# Patient Record
Sex: Female | Born: 1990 | Race: White | Hispanic: No | Marital: Married
Health system: Southern US, Academic
[De-identification: ages and names within clinical notes are randomized; demographics above are authoritative.]

## PROBLEM LIST (undated history)

## (undated) ENCOUNTER — Telehealth: Attending: Internal Medicine | Primary: Internal Medicine

## (undated) ENCOUNTER — Encounter
Attending: Student in an Organized Health Care Education/Training Program | Primary: Student in an Organized Health Care Education/Training Program

## (undated) ENCOUNTER — Encounter

## (undated) ENCOUNTER — Telehealth: Attending: Women's Health | Primary: Women's Health

## (undated) ENCOUNTER — Encounter: Attending: Internal Medicine | Primary: Internal Medicine

## (undated) ENCOUNTER — Ambulatory Visit

## (undated) ENCOUNTER — Encounter: Attending: Medical Oncology | Primary: Medical Oncology

## (undated) ENCOUNTER — Encounter: Attending: Pharmacist | Primary: Pharmacist

## (undated) ENCOUNTER — Ambulatory Visit: Payer: PRIVATE HEALTH INSURANCE | Attending: Clinical | Primary: Clinical

## (undated) ENCOUNTER — Ambulatory Visit: Payer: PRIVATE HEALTH INSURANCE

## (undated) ENCOUNTER — Telehealth

## (undated) ENCOUNTER — Telehealth: Attending: Pharmacist | Primary: Pharmacist

## (undated) ENCOUNTER — Encounter: Attending: Women's Health | Primary: Women's Health

## (undated) ENCOUNTER — Telehealth: Attending: Adult Health | Primary: Adult Health

## (undated) ENCOUNTER — Encounter: Attending: Family | Primary: Family

## (undated) ENCOUNTER — Telehealth: Payer: PRIVATE HEALTH INSURANCE | Attending: Clinical | Primary: Clinical

## (undated) ENCOUNTER — Encounter: Attending: Clinical | Primary: Clinical

## (undated) ENCOUNTER — Telehealth
Attending: Student in an Organized Health Care Education/Training Program | Primary: Student in an Organized Health Care Education/Training Program

## (undated) ENCOUNTER — Encounter
Attending: Rehabilitative and Restorative Service Providers" | Primary: Rehabilitative and Restorative Service Providers"

## (undated) ENCOUNTER — Ambulatory Visit: Payer: PRIVATE HEALTH INSURANCE | Attending: Internal Medicine | Primary: Internal Medicine

## (undated) ENCOUNTER — Encounter: Attending: Adult Health | Primary: Adult Health

## (undated) ENCOUNTER — Ambulatory Visit: Payer: PRIVATE HEALTH INSURANCE | Attending: Adult Health | Primary: Adult Health

## (undated) ENCOUNTER — Ambulatory Visit
Payer: PRIVATE HEALTH INSURANCE | Attending: Plastic and Reconstructive Surgery | Primary: Plastic and Reconstructive Surgery

## (undated) ENCOUNTER — Ambulatory Visit: Payer: PRIVATE HEALTH INSURANCE | Attending: "Women's Health Care | Primary: "Women's Health Care

## (undated) ENCOUNTER — Institutional Professional Consult (permissible substitution): Payer: PRIVATE HEALTH INSURANCE

## (undated) ENCOUNTER — Encounter: Payer: PRIVATE HEALTH INSURANCE | Attending: Internal Medicine | Primary: Internal Medicine

## (undated) ENCOUNTER — Ambulatory Visit: Payer: PRIVATE HEALTH INSURANCE | Attending: Radiation Oncology | Primary: Radiation Oncology

## (undated) ENCOUNTER — Ambulatory Visit: Payer: PRIVATE HEALTH INSURANCE | Attending: Obstetrics & Gynecology | Primary: Obstetrics & Gynecology

## (undated) ENCOUNTER — Ambulatory Visit
Payer: PRIVATE HEALTH INSURANCE | Attending: Rehabilitative and Restorative Service Providers" | Primary: Rehabilitative and Restorative Service Providers"

## (undated) ENCOUNTER — Encounter: Attending: Critical Care Medicine | Primary: Critical Care Medicine

## (undated) ENCOUNTER — Ambulatory Visit: Payer: PRIVATE HEALTH INSURANCE | Attending: Foot and Ankle Surgery | Primary: Foot and Ankle Surgery

## (undated) ENCOUNTER — Ambulatory Visit: Payer: PRIVATE HEALTH INSURANCE | Attending: Family | Primary: Family

## (undated) ENCOUNTER — Ambulatory Visit: Attending: Radiation Oncology | Primary: Radiation Oncology

## (undated) ENCOUNTER — Encounter: Attending: Geriatric Medicine | Primary: Geriatric Medicine

## (undated) ENCOUNTER — Encounter: Attending: Nurse Practitioner | Primary: Nurse Practitioner

## (undated) ENCOUNTER — Encounter: Attending: Cardiovascular Disease | Primary: Cardiovascular Disease

## (undated) ENCOUNTER — Encounter: Attending: Foot and Ankle Surgery | Primary: Foot and Ankle Surgery

## (undated) ENCOUNTER — Telehealth: Attending: Family | Primary: Family

## (undated) ENCOUNTER — Ambulatory Visit
Attending: Rehabilitative and Restorative Service Providers" | Primary: Rehabilitative and Restorative Service Providers"

## (undated) ENCOUNTER — Ambulatory Visit: Payer: PRIVATE HEALTH INSURANCE | Attending: Geriatric Medicine | Primary: Geriatric Medicine

## (undated) ENCOUNTER — Ambulatory Visit: Payer: Medicaid (Managed Care)

## (undated) ENCOUNTER — Ambulatory Visit: Payer: PRIVATE HEALTH INSURANCE | Attending: Hematology & Oncology | Primary: Hematology & Oncology

## (undated) ENCOUNTER — Ambulatory Visit
Attending: Student in an Organized Health Care Education/Training Program | Primary: Student in an Organized Health Care Education/Training Program

## (undated) ENCOUNTER — Telehealth
Attending: Pharmacist Clinician (PhC)/ Clinical Pharmacy Specialist | Primary: Pharmacist Clinician (PhC)/ Clinical Pharmacy Specialist

## (undated) ENCOUNTER — Ambulatory Visit: Payer: PRIVATE HEALTH INSURANCE | Attending: Women's Health | Primary: Women's Health

## (undated) ENCOUNTER — Telehealth: Attending: Nurse Practitioner | Primary: Nurse Practitioner

## (undated) ENCOUNTER — Telehealth: Attending: Obstetrics & Gynecology | Primary: Obstetrics & Gynecology

## (undated) DIAGNOSIS — G43909 Migraine, unspecified, not intractable, without status migrainosus: Secondary | ICD-10-CM

## (undated) DIAGNOSIS — G932 Benign intracranial hypertension: Secondary | ICD-10-CM

## (undated) HISTORY — DX: Benign intracranial hypertension: G93.2

## (undated) HISTORY — DX: Migraine, unspecified, not intractable, without status migrainosus: G43.909

## (undated) HISTORY — PX: TONSILLECTOMY AND ADENOIDECTOMY: SHX28

## (undated) HISTORY — PX: LUMBAR PUNCTURE: SHX1985

## (undated) MED ORDER — ERGOCALCIFEROL (VITAMIN D2) 1,250 MCG (50,000 UNIT) CAPSULE: ORAL | 0.00000 days

## (undated) MED ORDER — FEXOFENADINE 180 MG TABLET: ORAL | 0 days

## (undated) MED ORDER — VITAMIN E 134 MG (200 UNIT) CAPSULE: Freq: Every day | ORAL | 0 days

## (undated) MED ORDER — TOPIRAMATE 50 MG TABLET: Freq: Two times a day (BID) | ORAL | 0 days

---
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
## 2009-01-12 ENCOUNTER — Ambulatory Visit: Payer: Self-pay | Admitting: Urology

## 2011-02-13 ENCOUNTER — Ambulatory Visit: Payer: Self-pay | Admitting: Family Medicine

## 2013-02-21 ENCOUNTER — Ambulatory Visit: Payer: Self-pay | Admitting: Urology

## 2014-02-21 ENCOUNTER — Telehealth: Payer: Self-pay | Admitting: Nurse Practitioner

## 2014-02-21 NOTE — Telephone Encounter (Signed)
Dr. Baruch Gouty calling about Ms Margaret Swanson who was last seen in our office 08/20/11. Her migraines were in good control at that time and she was taking Maxalt acutely. She was no longer having numbness with her headaches at that time prior to the headache. Normal MRI and MRA of the brain. Dr. Sherie Don is concerned about her [facility] methods . She was taking the a birth control pill when last seen.

## 2014-06-02 DIAGNOSIS — G932 Benign intracranial hypertension: Secondary | ICD-10-CM

## 2014-06-02 HISTORY — DX: Benign intracranial hypertension: G93.2

## 2014-08-17 ENCOUNTER — Ambulatory Visit: Payer: Self-pay | Admitting: Otolaryngology

## 2014-12-11 ENCOUNTER — Telehealth: Payer: Self-pay | Admitting: Unknown Physician Specialty

## 2014-12-11 NOTE — Telephone Encounter (Signed)
I asked Tiffany about this and she stated that the patient [facility].

## 2015-01-10 ENCOUNTER — Other Ambulatory Visit: Payer: Self-pay | Admitting: Neurology

## 2015-01-10 DIAGNOSIS — G932 Benign intracranial hypertension: Secondary | ICD-10-CM

## 2015-01-22 ENCOUNTER — Ambulatory Visit
Admission: RE | Admit: 2015-01-22 | Discharge: 2015-01-22 | Disposition: A | Discharge status: Home / Self Care | Payer: BC Managed Care – PPO | Source: Ambulatory Visit | Attending: Neurology | Admitting: Neurology

## 2015-01-22 ENCOUNTER — Other Ambulatory Visit: Payer: Self-pay | Admitting: Neurology

## 2015-01-22 DIAGNOSIS — G932 Benign intracranial hypertension: Secondary | ICD-10-CM

## 2015-01-22 DIAGNOSIS — H4710 Unspecified papilledema: Secondary | ICD-10-CM | POA: Diagnosis present

## 2015-01-22 LAB — CBC
HEMATOCRIT: 36.7 % (ref 35.0–47.0)
Hemoglobin: 12.1 g/dL (ref 12.0–16.0)
MCH: 25.2 pg — AB (ref 26.0–34.0)
MCHC: 33 g/dL (ref 32.0–36.0)
MCV: 76.5 fL — AB (ref 80.0–100.0)
Platelets: 225 10*3/uL (ref 150–440)
RBC: 4.8 MIL/uL (ref 3.80–5.20)
RDW: 14.9 % — AB (ref 11.5–14.5)
WBC: 7.8 10*3/uL (ref 3.6–11.0)

## 2015-01-22 LAB — CSF CELL COUNT WITH DIFFERENTIAL
Eosinophils, CSF: 0 %
Eosinophils, CSF: 0 %
Lymphs, CSF: 97 %
Lymphs, CSF: 98 %
MONOCYTE-MACROPHAGE-SPINAL FLUID: 2 %
MONOCYTE-MACROPHAGE-SPINAL FLUID: 3 %
OTHER CELLS CSF: 0
Other Cells, CSF: 0
RBC COUNT CSF: 24 /mm3 — AB (ref 0–3)
RBC Count, CSF: 0 /mm3 (ref 0–3)
SEGMENTED NEUTROPHILS-CSF: 0 %
Segmented Neutrophils-CSF: 0 %
TUBE #: 1
Tube #: 3
WBC CSF: 27 /mm3
WBC CSF: 38 /mm3

## 2015-01-22 LAB — PROTEIN AND GLUCOSE, CSF
GLUCOSE CSF: 57 mg/dL (ref 40–70)
Total  Protein, CSF: 27 mg/dL (ref 15–45)

## 2015-01-22 LAB — PROTIME-INR
INR: 1.01
Prothrombin Time: 13.5 seconds (ref 11.4–15.0)

## 2015-01-22 LAB — APTT: APTT: 29 s (ref 24–36)

## 2015-01-22 LAB — HCG, QUANTITATIVE, PREGNANCY: HCG, BETA CHAIN, QUANT, S: 1 m[IU]/mL (ref <5)

## 2015-01-22 MED ORDER — ACETAMINOPHEN 325 MG PO TABS
650.0000 mg | ORAL_TABLET | ORAL | Status: DC | PRN
Start: 2015-01-22 — End: 2015-01-23

## 2015-01-22 NOTE — OR Nursing (Signed)
Dr Sherryll Burger ordered pre op ativan given pre arrival to hospital and one pre procedure. First dose at 0700 and pt took second after signed consent. (Dr Karle Starch aware)

## 2015-01-22 NOTE — Discharge Instructions (Signed)
Lumbar Puncture °A lumbar puncture, or spinal tap, is a procedure in which a small amount of the fluid that surrounds the brain and spinal cord is removed and examined. The fluid is called the cerebrospinal fluid. This procedure may be done to:  °· Help diagnose various problems, such as meningitis, encephalitis, multiple sclerosis, and AIDS.   °· Remove fluid and relieve pressure that occurs with certain types of headaches.   °· Look for bleeding within the brain and spinal cord areas (central nervous system).   °· Place medicine into the spinal fluid.   °LET YOUR HEALTH CARE PROVIDER KNOW ABOUT: °· Any allergies you have. °· All medicines you are taking, including vitamins, herbs, eye drops, creams, and over-the-counter medicines. °· Previous problems you or members of your family have had with the use of anesthetics. °· Any blood disorders you have. °· Previous surgeries you have had. °· Medical conditions you have. °RISKS AND COMPLICATIONS °Generally, this is a safe procedure. However, as with any procedure, complications can occur. Possible complications include:  °· Spinal headache. This is a severe headache that occurs when there is a leak of spinal fluid. A spinal headache causes discomfort but is not dangerous. If it persists, another procedure may be done to treat the headache. °· Bleeding. This most often occurs in people with bleeding disorders. These are disorders in which the blood does not clot normally.   °· Infection at the insertion site that can spread to the bone or spinal fluid.  °· Formation of a spinal cord tumor (rare). °· Brain herniation or movement of the brain into the spinal cord (rare). °· Inability to move (extremely rare). °BEFORE THE PROCEDURE °· You may have blood tests done. These tests can help tell how well your kidneys and liver are working. They can also show how well your blood clots.   °· If you take blood thinners (anticoagulant medicine), ask your health care provider if  and when you should stop taking them.   °· Your health care provider may order a CT scan of your brain. °· Make arrangements for someone to drive you home after the procedure.     °PROCEDURE °· You will be positioned so that the spaces between the bones of the spine (vertebrae) are as wide as possible. This will make it easier to pass the needle into the spinal canal.  °· Depending on your age and size, you may lie on your side, curled up with your knees under your chin. Or, you may sit with your head resting on a pillow that is placed at waist level. °· The skin covering the lower back (or lumbar region) will be cleaned.   °· The skin may be numbed with medicine. °· You may be given pain medicine or a medicine to help you relax (sedative).   °· A small needle will be inserted in the skin until it enters the space that contains the spinal fluid. The needle will not enter the spinal cord.   °· The spinal fluid will be collected into tubes.   °· The needle will be withdrawn, and a bandage will be placed on the site.   °AFTER THE PROCEDURE °· You will remain lying down for 1 hour or for as long as your health care provider suggests.   °· The spinal fluid will be sent to a laboratory to be examined. The results of the examination may be available before you go home. °· A test, called a culture, may be taken of the spinal fluid if your health care provider thinks you have an infection. If cultures   were taken for exam, the results will usually be available in a couple of days.  Document Released: 05/16/2000 Document Revised: 03/09/2013 Document Reviewed: 01/24/2013 [facility] Patient Information 2015 [place], [place]. This information is not intended to replace advice given to you by your health care provider. Make sure you discuss any questions you have with your health care provider. Cerebrospinal Fluid Analysis Cerebrospinal fluid (CSF) analysis is a group of lab tests done on a sample of the fluid that surrounds  and protects your brain and spinal cord. CSF is usually obtained from a lumbar puncture, also known as a spinal tap. This procedure involves sticking a long, thin needle through the skin and between the backbones (vertebrae) of your lower back until it reaches the CSF space. The fluid is collected in test tubes and is sent to the lab for testing.  Your health care provider may order a CSF analysis if your symptoms or medical history suggest one of the following conditions:  An infection of your brain (encephalitis) or surrounding membranes (meningitis).  Bleeding around your brain (subarachnoid hemorrhage).  Certaindiseases that affect your brain and spinal cord, such as multiple sclerosis and Guillain-Barr syndrome.  A tumor on your brain or spinal cord. Tumors may start in your brain (primary tumor). They may also spread from your blood or other areas of your body (metastatic tumor). Routine CSF analysis looks at:  The appearance of your CSF.  The makeup of the cells in your CSF.  The chemicals in your CSF. RESULTS Ranges for normal values may vary among different labs and hospitals. You should always check with your health care provider after having lab work or other tests done to discuss whether your values are considered within normal limits. The range of normal values is listed as follows:  Range of Normal Values  Appearance: clear and colorless.  Cell Count  Red blood cells: adults--none present (newborns--variable).  White blood cells: adults--less than 5/microliter (newborns 0-30/microliter).  Differential   Neutrophils: adults--2% (newborns--4%).  Lymphocytes: Adults--60% (newborns--20%).  Monocytes: Adults--30% ( newborns--70%).  Other cells: No malignant (cancerous) cells present.  Protein: adults 15-45 mg/dL (newborns up to 960 mg/dL).  Glucose: adults 50-80 mg/dL or 45-40% of blood glucose level (newborns--80% of blood glucose level).  Gram stain:  negative (no bacteria present). Meaning of Results Outside Normal Value Ranges  Abnormal appearance can mean abnormal clarity, color, or both. Cloudy CSF contains blood, a lot of infectious organisms, or very high protein levels. Bloody CSF can be from bleeding around the brain or bleeding from a spinal tap (traumatic tap). A traumatic tap can also cause the CSF to be clotted. Yellowish CSF may be seen with bleeding around the brain and in conditions that cause high bilirubin levels and jaundice.  Red blood cells in the CSF can be from bleeding around the brain or a traumatic tap.  Elevated white blood cell (WBC) counts in the CSF are seen with infections and various diseases of the brain and spinal cord, after seizures, and with brain tumors. Elevated WBC counts are also seen when there is blood in the CSF.  Abnormal differential counts with an increase in neutrophils are most often seen with bacterial or fungal meningitis. They may also be seen with brain tumors. Differential counts with an increase in lymphocytes are seen with viral or tubercular meningitis, multiple sclerosis, Guillain-Barr syndrome, leukemia, and lymphoma. Eosinophils in CSF may indicate a parasitic infection or allergic reaction. Malignant cells may be seen with metastatic cancer, large  cell lymphomas, and acute leukemia.  Increased CSF protein is seen with most infections of the brain and spinal cord, bleeding around the brain, traumatic tap, traumatic brain injury, multiple sclerosis, Guillain-Barr syndrome, tumors, and various inflammatory conditions.  Decreased CSF glucose may be seen with low blood sugar (hypoglycemia), meningitis, inflammation, and tumors. Increased CSF glucose may be seen with high blood sugar (hyperglycemia) or blood in the sample.  A positive Gram stain may be seen in bacterial meningitis. It may also be seen in some forms of fungal meningitis. Document Released: 06/21/2004 Document Revised:  01/19/2013 Document Reviewed: 01/06/2013 [facility] Patient Information 2015 [place], [place]. This information is not intended to replace advice given to you by your health care provider. Make sure you discuss any questions you have with your health care provider.

## 2015-01-22 NOTE — Procedures (Signed)
Ct guided lumbar puncture  Complications:  None  Blood Loss: none  See dictation in canopy pacs

## 2015-01-23 LAB — PATHOLOGIST SMEAR REVIEW

## 2015-01-25 LAB — CSF CULTURE: CULTURE: NO GROWTH

## 2015-01-25 LAB — CSF CULTURE W GRAM STAIN

## 2015-01-31 ENCOUNTER — Other Ambulatory Visit: Payer: Self-pay | Admitting: Neurology

## 2015-01-31 DIAGNOSIS — Z932 Ileostomy status: Secondary | ICD-10-CM

## 2015-02-02 ENCOUNTER — Ambulatory Visit
Admission: RE | Admit: 2015-02-02 | Discharge: 2015-02-02 | Disposition: A | Discharge status: Home / Self Care | Payer: BC Managed Care – PPO | Source: Ambulatory Visit | Attending: Neurology | Admitting: Neurology

## 2015-02-02 DIAGNOSIS — G932 Benign intracranial hypertension: Secondary | ICD-10-CM | POA: Insufficient documentation

## 2015-02-02 DIAGNOSIS — Z932 Ileostomy status: Secondary | ICD-10-CM

## 2015-02-20 ENCOUNTER — Encounter: Payer: Self-pay | Admitting: Dietician

## 2015-02-20 ENCOUNTER — Encounter: Payer: BC Managed Care – PPO | Attending: Nephrology | Admitting: Dietician

## 2015-02-20 VITALS — Ht 67.0 in | Wt 233.3 lb

## 2015-02-20 DIAGNOSIS — E669 Obesity, unspecified: Secondary | ICD-10-CM | POA: Diagnosis not present

## 2015-02-20 NOTE — Patient Instructions (Signed)
   Begin planning ahead for meal prep, start when you plan grocery shopping trip, and consider work schedules and other events during the week.   Use menus for ideas.   Remember to include a lean protein, controlled portion of healthy carb, and plenty of low-cal veggies and/or fruit.   Keep any sugar-sweetened drinks to 1 small-med. glass per day.   Consider using app or website such as MyFitnessPal or sparkpeople for free tracking of food intake and/or exercise.

## 2015-02-20 NOTE — Progress Notes (Signed)
Medical Nutrition Therapy: Visit start time: 1100  end time: 1200  Assessment:  Diagnosis: obesity Past medical history: migraines, idiopathic intracranial hypertension Psychosocial issues/ stress concerns: Patient reports high stress level; she does have healthy stress management practices in place. Preferred learning method:  . No preference indicated  Current weight: 233.3lbs  Height: 5'7" Medications, supplements: reviewed list in chart with patient Progress and evaluation: Patient reports weight loss prior to her wedding (?2014) due to more erratic meal pattern, stress. She has since then regained weight gradually over the past 2 years.                She reports having ongoing struggle to lose weight, even when she makes diet changes. She states her parents are able to lose weight fairly easily but strategies that work for her parents haven't worked for her.  Physical activity: walking, resistance training, stretching 20-30 minutes, 1-2 times per week.  Dietary Intake:  Usual eating pattern includes 3 meals and 0-2 snacks per day. Dining out frequency: 7-8 meals per week.  Breakfast: 7:30 [employer] bar, or prepackaged oatmeal. Tea or coffee occasional orange juice or Sunny D. Milk or water causes Gi upset in am. Snack: none Lunch: sandwich with chips or pretzels, sometimes salad or pizza or sushi; sometimes potluck meals at work (i.e. Medical laboratory scientific officer) Snack: not consistent; sometimes if someone brings food to share at work Supper: grilled chicken or steak, burger, hot dog, spaghetti, lasagna with side.  Snack: usually none, frozen yogurt on occasion Beverages: water mostly, sometimes sweet tea or regular soda at home  Nutrition Care Education: Topics covered: weight management Basic nutrition: basic food groups, appropriate nutrient balance, appropriate meal and snack schedule, general nutrition guidelines    Weight control: calculated caloric needs for weight loss at 1500-1700kcal daily.  Provided 1500kcal meal plan. Discussed importance of low fat and low sugar choices. Discussed portion control. Advanced nutrition:  cooking techniques, food label reading Other lifestyle changes:  Importance of regular exercise   Nutritional Diagnosis:  [place]-3.3 Overweight/obesity As related to low activity level, history of excess caloric intake, other possible factors.  As evidenced by patient report, high BMI.  Intervention: Instruction as noted above.   Patient felt most important strategy would be to work on pre-planning meals.    Set goals for planning healthy balanced meals, encouraged larger portions of vegetables or fruits and controlled portions of starches and meats.  Education Materials given:  . Food lists/ Planning A Balanced Meal . Sample meal pattern/ menus: 1500kcal meal plan; Quick and Healthy Meal Ideas . Snacking handout . Goals/ instructions   Learner/ who was taught:  . Patient   Level of understanding: Marland Kitchen Verbalizes/ demonstrates competency  Demonstrated degree of understanding via:   Teach back Learning barriers: . None  Willingness to learn/ readiness for change: . Acceptance, ready for change   Monitoring and Evaluation:  Dietary intake, exercise, and body weight      follow up: to schedule after MD appt on 02/28/15.

## 2015-03-04 DIAGNOSIS — G08 Intracranial and intraspinal phlebitis and thrombophlebitis: Secondary | ICD-10-CM | POA: Insufficient documentation

## 2015-04-18 DIAGNOSIS — G932 Benign intracranial hypertension: Secondary | ICD-10-CM | POA: Insufficient documentation

## 2015-04-19 ENCOUNTER — Encounter: Payer: BC Managed Care – PPO | Attending: Nephrology | Admitting: Dietician

## 2015-04-19 VITALS — Ht 67.0 in | Wt 214.2 lb

## 2015-04-19 DIAGNOSIS — E669 Obesity, unspecified: Secondary | ICD-10-CM | POA: Insufficient documentation

## 2015-04-19 NOTE — Progress Notes (Signed)
Medical Nutrition Therapy: Visit start time: 1130  end time: 1200  Assessment:  Diagnosis: obesity Medical history changes: none; patient reports improvement in optic nerve Psychosocial issues/ stress concerns: none  Current weight: 214.2lbs  Height: 5'3" Medications, supplement changes: no changes Progress and evaluation: Patient has lost about 19lbs since 02/20/15.          She reports using meal plan and menus to eat more balanced meals.          She has also decreased food portions, and has tracked food intake using phone app., averaging 1500kcal as established at previous visit.  Physical activity: walking, resistance training 30 minutes, 1-2 days per week  Dietary Intake:  Usual eating pattern includes 3 meals and 1 snacks per day. Dining out frequency: not assessed today.  Breakfast: oatmeal with peanut butter, milk Snack: none Lunch: lean cuisine meals recently, was using menus from initial visit Snack: sometimes vegetable such as cucumbers or popcorn, almond, cheese Supper: balanced meal using meal plan and food lists, menus.  Snack: none Beverages: water, sparkling flavored water  Nutrition Care Education: Topics covered: weight management Weight control: strategies for maintaining healthy choices and balanced meals; holiday planning; dealing with possible weight loss plateaus.    Nutritional Diagnosis:  [place]-3.3 Overweight/obesity As related to history of excess caloric intake, low activity.  As evidenced by patient report, high BMI.  Intervention: Discussion as noted above.   Commended patient for her successful efforts at weight loss.    No new nutrition goals at this time.   Education Materials given:  . None at this visit  Learner/ who was taught:  . Patient  . Family member mother   Level of understanding: Marland Kitchen. Verbalizes/ demonstrates competency  Demonstrated degree of understanding via:   Teach back Learning barriers: . None  Willingness to learn/  readiness for change: . Eager, change in progress  Monitoring and Evaluation:  Dietary intake, exercise, and body weight      follow up: 07/06/15

## 2015-06-12 ENCOUNTER — Encounter: Payer: Self-pay | Admitting: Obstetrics and Gynecology

## 2015-06-21 ENCOUNTER — Ambulatory Visit (INDEPENDENT_AMBULATORY_CARE_PROVIDER_SITE_OTHER): Payer: BC Managed Care – PPO | Admitting: Obstetrics and Gynecology

## 2015-06-21 ENCOUNTER — Encounter: Payer: Self-pay | Admitting: Obstetrics and Gynecology

## 2015-06-21 VITALS — BP 114/75 | HR 89 | Ht 67.0 in | Wt 210.3 lb

## 2015-06-21 DIAGNOSIS — E669 Obesity, unspecified: Secondary | ICD-10-CM

## 2015-06-21 DIAGNOSIS — Z3009 Encounter for other general counseling and advice on contraception: Secondary | ICD-10-CM | POA: Diagnosis not present

## 2015-06-21 DIAGNOSIS — Z01419 Encounter for gynecological examination (general) (routine) without abnormal findings: Secondary | ICD-10-CM

## 2015-06-21 NOTE — Progress Notes (Signed)
GYNECOLOGY ANNUAL EXAM CLINIC PROGRESS NOTE  Subjective:     Margaret Swanson is a 25 y.o. P0 female here for a routine exam.  Current complaints: none.  The patient wears seatbelts: yes. The patient participates in regular exercise: no. Has the patient ever been transfused or tattooed?: no. The patient reports that there is not domestic violence in her life.    Gynecologic History Patient's last menstrual period was 06/10/2015. Contraception: condoms.  Is desiring conception within the next year. Last Pap: 2015. Results were: normal (er patient).  Denies h/o abnormal paps. Denies h/o STIs.  Obstetric History OB History  Gravida Para Term Preterm AB SAB TAB Ectopic Multiple Living         Past Medical History  Diagnosis Date  . Migraine   . Idiopathic intracranial hypertension 2016    Past Surgical History  Procedure Laterality Date  . Lumbar puncture    . Tonsillectomy and adenoidectomy      Family History  Problem Relation Age of Onset  . Hypothyroidism Mother   . Hypertension Father   . Breast cancer Maternal Aunt   . Ovarian cancer Maternal Grandmother     Social History   Social History  . Marital Status: Married    Spouse Name: N/A  . Number of Children: N/A  . Years of Education: N/A   Occupational History  . Not on file.   Social History Main Topics  . Smoking status: Never Smoker   . Smokeless tobacco: Not on file  . Alcohol Use: 0.6 - 1.2 oz/week    1-2 Standard drinks or equivalent per week  . Drug Use: No  . Sexual Activity: Not on file   Other Topics Concern  . Not on file   Social History Narrative    Current Outpatient Prescriptions on File Prior to Visit  Medication Sig Dispense Refill  . rizatriptan (MAXALT-MLT) 10 MG disintegrating tablet      No current facility-administered medications on file prior to visit.    No Known Allergies   Review of Systems Constitutional: negative for chills, fatigue,  fevers and sweats Eyes: negative for irritation, redness and visual disturbance Ears, nose, mouth, throat, and face: negative for hearing loss, nasal congestion, snoring and tinnitus Respiratory: negative for asthma, cough, sputum Cardiovascular: negative for chest pain, dyspnea, exertional chest pressure/discomfort, irregular heart beat, palpitations and syncope Gastrointestinal: negative for abdominal pain, change in bowel habits, nausea and vomiting Genitourinary: negative for abnormal menstrual periods, genital lesions, sexual problems and vaginal discharge, dysuria and urinary incontinence Integument/breast: negative for breast lump, breast tenderness and nipple discharge Hematologic/lymphatic: negative for bleeding and easy bruising Musculoskeletal:negative for back pain and muscle weakness Neurological: negative for dizziness, headaches, vertigo and weakness Endocrine: negative for diabetic symptoms including polydipsia, polyuria and skin dryness Allergic/Immunologic: negative for hay fever and urticaria    Objective:    BP 114/75 mmHg  Pulse 89  Ht  (1.702 m)  Wt 210 lb 4.8 oz (95.391 kg)  BMI 32.93 kg/m2  LMP 06/10/2015    General Appearance:    Alert, cooperative, no distress, appears stated age  Head:    Normocephalic, without obvious abnormality, atraumatic  Eyes:    PERRL, conjunctiva/corneas clear, EOM's intact, both eyes  Ears:    Normal external ear canals, both ears  Nose:   Nares normal, septum midline, mucosa normal, no drainage or sinus tenderness  Throat:   Lips, mucosa,  and tongue normal; teeth and gums normal  Neck:   Supple, symmetrical, trachea midline, no adenopathy; thyroid: no enlargement/tenderness/nodules; no carotid bruit or JVD  Back:     Symmetric, no curvature, ROM normal, no CVA tenderness  Lungs:     Clear to auscultation bilaterally, respirations unlabored  Chest Wall:    No tenderness or deformity   Heart:    Regular rate and rhythm, S1 and  S2 normal, no murmur, rub or gallop  Breast Exam:    No tenderness, masses, or nipple abnormality  Abdomen:     Soft, non-tender, bowel sounds active all four quadrants, no masses, no organomegaly  Genitalia:    Pelvic:external genitalia normal, vagina without lesions, discharge, or tenderness, rectovaginal septum  normal. Cervix normal in appearance, no cervical motion tenderness, no adnexal masses or tenderness.  Uterus mobile, normal shape, size, nontender.    Rectal:    Normal external sphincter.  No hemorrhoids appreciated. Internal exam not done.   Extremities:   Extremities normal, atraumatic, no cyanosis or edema  Pulses:   2+ and symmetric all extremities  Skin:   Skin color, texture, turgor normal, no rashes or lesions  Lymph nodes:   Cervical, supraclavicular, and axillary nodes normal  Neurologic:   CNII-XII intact, normal strength, sensation and reflexes throughout    Assessment:   Healthy female exam.  Desiring conception Obesity (Class II)   Plan:  Labs: CBC and Chem 14 ordered.  Declines STI testing.  Pap smear up to date. Will need repeat in 1 year.  Discussed weight management.  Patient already on weight loss management program to help with improving symptoms of IIH.  Notes that she has lost ~14 lbs so far.   Contraception: condoms. Discussed starting on PNV approximately 2-3 months prior to active attempts at conception.  Follow up in: 1 year, or sooner as needed.    Hildred Laser, MD [facility]

## 2015-06-21 NOTE — Patient Instructions (Signed)
Preventive Care for Adults, Female A healthy lifestyle and preventive care can promote health and wellness. Preventive health guidelines for women include the following key practices.  A routine yearly physical is a good way to check with your health care provider about your health and preventive screening. It is a chance to share any concerns and updates on your health and to receive a thorough exam.  Visit your dentist for a routine exam and preventive care every 6 months. Brush your teeth twice a day and floss once a day. Good oral hygiene prevents tooth decay and gum disease.  The frequency of eye exams is based on your age, health, family medical history, use of contact lenses, and other factors. Follow your health care provider's recommendations for frequency of eye exams.  Eat a healthy diet. Foods like vegetables, fruits, whole grains, low-fat dairy products, and lean protein foods contain the nutrients you need without too many calories. Decrease your intake of foods high in solid fats, added sugars, and salt. Eat the right amount of calories for you.Get information about a proper diet from your health care provider, if necessary.  Regular physical exercise is one of the most important things you can do for your health. Most adults should get at least 150 minutes of moderate-intensity exercise (any activity that increases your heart rate and causes you to sweat) each week. In addition, most adults need muscle-strengthening exercises on 2 or more days a week.  Maintain a healthy weight. The body mass index (BMI) is a screening tool to identify possible weight problems. It provides an estimate of body fat based on height and weight. Your health care provider can find your BMI and can help you achieve or maintain a healthy weight.For adults 20 years and older:  A BMI below 18.5 is considered underweight.  A BMI of 18.5 to 24.9 is normal.  A BMI of 25 to 29.9 is considered  overweight.  A BMI of 30 and above is considered obese.  Maintain normal blood lipids and cholesterol levels by exercising and minimizing your intake of saturated fat. Eat a balanced diet with plenty of fruit and vegetables. Blood tests for lipids and cholesterol should begin at age 64 and be repeated every 5 years. If your lipid or cholesterol levels are high, you are over 50, or you are at high risk for heart disease, you may need your cholesterol levels checked more frequently.Ongoing high lipid and cholesterol levels should be treated with medicines if diet and exercise are not working.  If you smoke, find out from your health care provider how to quit. If you do not use tobacco, do not start.  Lung cancer screening is recommended for adults aged 52-80 years who are at high risk for developing lung cancer because of a history of smoking. A yearly low-dose CT scan of the lungs is recommended for people who have at least a 30-pack-year history of smoking and are a current smoker or have quit within the past 15 years. A pack year of smoking is smoking an average of 1 pack of cigarettes a day for 1 year (for example: 1 pack a day for 30 years or 2 packs a day for 15 years). Yearly screening should continue until the smoker has stopped smoking for at least 15 years. Yearly screening should be stopped for people who develop a health problem that would prevent them from having lung cancer treatment.  If you are pregnant, do not drink alcohol. If you are  breastfeeding, be very cautious about drinking alcohol. If you are not pregnant and choose to drink alcohol, do not have more than 1 drink per day. One drink is considered to be 12 ounces (355 mL) of beer, 5 ounces (148 mL) of wine, or 1.5 ounces (44 mL) of liquor.  Avoid use of street drugs. Do not share needles with anyone. Ask for help if you need support or instructions about stopping the use of drugs.  High blood pressure causes heart disease and  increases the risk of stroke. Your blood pressure should be checked at least every 1 to 2 years. Ongoing high blood pressure should be treated with medicines if weight loss and exercise do not work.  If you are 25-78 years old, ask your health care provider if you should take aspirin to prevent strokes.  Diabetes screening is done by taking a blood sample to check your blood glucose level after you have not eaten for a certain period of time (fasting). If you are not overweight and you do not have risk factors for diabetes, you should be screened once every 3 years starting at age 86. If you are overweight or obese and you are 3-87 years of age, you should be screened for diabetes every year as part of your cardiovascular risk assessment.  Breast cancer screening is essential preventive care for women. You should practice "breast self-awareness." This means understanding the normal appearance and feel of your breasts and may include breast self-examination. Any changes detected, no matter how small, should be reported to a health care provider. Women in their 66s and 30s should have a clinical breast exam (CBE) by a health care provider as part of a regular health exam every 1 to 3 years. After age 43, women should have a CBE every year. Starting at age 37, women should consider having a mammogram (breast X-ray test) every year. Women who have a family history of breast cancer should talk to their health care provider about genetic screening. Women at a high risk of breast cancer should talk to their health care providers about having an MRI and a mammogram every year.  Breast cancer gene (BRCA)-related cancer risk assessment is recommended for women who have family members with BRCA-related cancers. BRCA-related cancers include breast, ovarian, tubal, and peritoneal cancers. Having family members with these cancers may be associated with an increased risk for harmful changes (mutations) in the breast  cancer genes BRCA1 and BRCA2. Results of the assessment will determine the need for genetic counseling and BRCA1 and BRCA2 testing.  Your health care provider may recommend that you be screened regularly for cancer of the pelvic organs (ovaries, uterus, and vagina). This screening involves a pelvic examination, including checking for microscopic changes to the surface of your cervix (Pap test). You may be encouraged to have this screening done every 3 years, beginning at age 78.  For women ages 79-65, health care providers may recommend pelvic exams and Pap testing every 3 years, or they may recommend the Pap and pelvic exam, combined with testing for human papilloma virus (HPV), every 5 years. Some types of HPV increase your risk of cervical cancer. Testing for HPV may also be done on women of any age with unclear Pap test results.  Other health care providers may not recommend any screening for nonpregnant women who are considered low risk for pelvic cancer and who do not have symptoms. Ask your health care provider if a screening pelvic exam is right for  you.  If you have had past treatment for cervical cancer or a condition that could lead to cancer, you need Pap tests and screening for cancer for at least 20 years after your treatment. If Pap tests have been discontinued, your risk factors (such as having a new sexual partner) need to be reassessed to determine if screening should resume. Some women have medical problems that increase the chance of getting cervical cancer. In these cases, your health care provider may recommend more frequent screening and Pap tests.  Colorectal cancer can be detected and often prevented. Most routine colorectal cancer screening begins at the age of 50 years and continues through age 75 years. However, your health care provider may recommend screening at an earlier age if you have risk factors for colon cancer. On a yearly basis, your health care provider may provide  home test kits to check for hidden blood in the stool. Use of a small camera at the end of a tube, to directly examine the colon (sigmoidoscopy or colonoscopy), can detect the earliest forms of colorectal cancer. Talk to your health care provider about this at age 50, when routine screening begins. Direct exam of the colon should be repeated every 5-10 years through age 75 years, unless early forms of precancerous polyps or small growths are found.  People who are at an increased risk for hepatitis B should be screened for this virus. You are considered at high risk for hepatitis B if:  You were born in a country where hepatitis B occurs often. Talk with your health care provider about which countries are considered high risk.  Your parents were born in a high-risk country and you have not received a shot to protect against hepatitis B (hepatitis B vaccine).  You have HIV or AIDS.  You use needles to inject street drugs.  You live with, or have sex with, someone who has hepatitis B.  You get hemodialysis treatment.  You take certain medicines for conditions like cancer, organ transplantation, and autoimmune conditions.  Hepatitis C blood testing is recommended for all people born from 1945 through 1965 and any individual with known risks for hepatitis C.  Practice safe sex. Use condoms and avoid high-risk sexual practices to reduce the spread of sexually transmitted infections (STIs). STIs include gonorrhea, chlamydia, syphilis, trichomonas, herpes, HPV, and human immunodeficiency virus (HIV). Herpes, HIV, and HPV are viral illnesses that have no cure. They can result in disability, cancer, and death.  You should be screened for sexually transmitted illnesses (STIs) including gonorrhea and chlamydia if:  You are sexually active and are younger than 24 years.  You are older than 24 years and your health care provider tells you that you are at risk for this type of infection.  Your sexual  activity has changed since you were last screened and you are at an increased risk for chlamydia or gonorrhea. Ask your health care provider if you are at risk.  If you are at risk of being infected with HIV, it is recommended that you take a prescription medicine daily to prevent HIV infection. This is called preexposure prophylaxis (PrEP). You are considered at risk if:  You are sexually active and do not regularly use condoms or know the HIV status of your partner(s).  You take drugs by injection.  You are sexually active with a partner who has HIV.  Talk with your health care provider about whether you are at high risk of being infected with HIV. If   you choose to begin PrEP, you should first be tested for HIV. You should then be tested every 3 months for as long as you are taking PrEP.  Osteoporosis is a disease in which the bones lose minerals and strength with aging. This can result in serious bone fractures or breaks. The risk of osteoporosis can be identified using a bone density scan. Women ages 1 years and over and women at risk for fractures or osteoporosis should discuss screening with their health care providers. Ask your health care provider whether you should take a calcium supplement or vitamin D to reduce the rate of osteoporosis.  Menopause can be associated with physical symptoms and risks. Hormone replacement therapy is available to decrease symptoms and risks. You should talk to your health care provider about whether hormone replacement therapy is right for you.  Use sunscreen. Apply sunscreen liberally and repeatedly throughout the day. You should seek shade when your shadow is shorter than you. Protect yourself by wearing long sleeves, pants, a wide-brimmed hat, and sunglasses year round, whenever you are outdoors.  Once a month, do a whole body skin exam, using a mirror to look at the skin on your back. Tell your health care provider of new moles, moles that have irregular  borders, moles that are larger than a pencil eraser, or moles that have changed in shape or color.  Stay current with required vaccines (immunizations).  Influenza vaccine. All adults should be immunized every year.  Tetanus, diphtheria, and acellular pertussis (Td, Tdap) vaccine. Pregnant women should receive 1 dose of Tdap vaccine during each pregnancy. The dose should be obtained regardless of the length of time since the last dose. Immunization is preferred during the 27th-36th week of gestation. An adult who has not previously received Tdap or who does not know her vaccine status should receive 1 dose of Tdap. This initial dose should be followed by tetanus and diphtheria toxoids (Td) booster doses every 10 years. Adults with an unknown or incomplete history of completing a 3-dose immunization series with Td-containing vaccines should begin or complete a primary immunization series including a Tdap dose. Adults should receive a Td booster every 10 years.  Varicella vaccine. An adult without evidence of immunity to varicella should receive 2 doses or a second dose if she has previously received 1 dose. Pregnant females who do not have evidence of immunity should receive the first dose after pregnancy. This first dose should be obtained before leaving the health care facility. The second dose should be obtained 4-8 weeks after the first dose.  Human papillomavirus (HPV) vaccine. Females aged 13-26 years who have not received the vaccine previously should obtain the 3-dose series. The vaccine is not recommended for use in pregnant females. However, pregnancy testing is not needed before receiving a dose. If a female is found to be pregnant after receiving a dose, no treatment is needed. In that case, the remaining doses should be delayed until after the pregnancy. Immunization is recommended for any person with an immunocompromised condition through the age of 24 years if she did not get any or all doses  earlier. During the 3-dose series, the second dose should be obtained 4-8 weeks after the first dose. The third dose should be obtained 24 weeks after the first dose and 16 weeks after the second dose.  Zoster vaccine. One dose is recommended for adults aged 97 years or older unless certain conditions are present.  Measles, mumps, and rubella (MMR) vaccine. Adults born  before 1957 generally are considered immune to measles and mumps. Adults born in [birth year] or later should have 1 or more doses of MMR vaccine unless there is a contraindication to the vaccine or there is laboratory evidence of immunity to each of the three diseases. A routine second dose of MMR vaccine should be obtained at least 28 days after the first dose for students attending postsecondary schools, health care workers, or international travelers. People who received inactivated measles vaccine or an unknown type of measles vaccine during 1963-1967 should receive 2 doses of MMR vaccine. People who received inactivated mumps vaccine or an unknown type of mumps vaccine before 1979 and are at high risk for mumps infection should consider immunization with 2 doses of MMR vaccine. For females of childbearing age, rubella immunity should be determined. If there is no evidence of immunity, females who are not pregnant should be vaccinated. If there is no evidence of immunity, females who are pregnant should delay immunization until after pregnancy. Unvaccinated health care workers born before 60 who lack laboratory evidence of measles, mumps, or rubella immunity or laboratory confirmation of disease should consider measles and mumps immunization with 2 doses of MMR vaccine or rubella immunization with 1 dose of MMR vaccine.  Pneumococcal 13-valent conjugate (PCV13) vaccine. When indicated, a person who is uncertain of his immunization history and has no record of immunization should receive the PCV13 vaccine. All adults 61 years of age and older  should receive this vaccine. An adult aged 92 years or older who has certain medical conditions and has not been previously immunized should receive 1 dose of PCV13 vaccine. This PCV13 should be followed with a dose of pneumococcal polysaccharide (PPSV23) vaccine. Adults who are at high risk for pneumococcal disease should obtain the PPSV23 vaccine at least 8 weeks after the dose of PCV13 vaccine. Adults older than 27 years of age who have normal immune system function should obtain the PPSV23 vaccine dose at least 1 year after the dose of PCV13 vaccine.  Pneumococcal polysaccharide (PPSV23) vaccine. When PCV13 is also indicated, PCV13 should be obtained first. All adults aged 2 years and older should be immunized. An adult younger than age 30 years who has certain medical conditions should be immunized. Any person who resides in a nursing home or long-term care facility should be immunized. An adult smoker should be immunized. People with an immunocompromised condition and certain other conditions should receive both PCV13 and PPSV23 vaccines. People with human immunodeficiency virus (HIV) infection should be immunized as soon as possible after diagnosis. Immunization during chemotherapy or radiation therapy should be avoided. Routine use of PPSV23 vaccine is not recommended for American Indians, [place] Natives, or people younger than 65 years unless there are medical conditions that require PPSV23 vaccine. When indicated, people who have unknown immunization and have no record of immunization should receive PPSV23 vaccine. One-time revaccination 5 years after the first dose of PPSV23 is recommended for people aged 19-64 years who have chronic kidney failure, nephrotic syndrome, asplenia, or immunocompromised conditions. People who received 1-2 doses of PPSV23 before age 44 years should receive another dose of PPSV23 vaccine at age 83 years or later if at least 5 years have passed since the previous dose. Doses  of PPSV23 are not needed for people immunized with PPSV23 at or after age 20 years.  Meningococcal vaccine. Adults with asplenia or persistent complement component deficiencies should receive 2 doses of quadrivalent meningococcal conjugate (MenACWY-D) vaccine. The doses should be obtained  at least 2 months apart. Microbiologists working with certain meningococcal bacteria, [place] recruits, people at risk during an outbreak, and people who travel to or live in countries with a high rate of meningitis should be immunized. A first-year college student up through age 28 years who is living in a residence hall should receive a dose if she did not receive a dose on or after her 16th birthday. Adults who have certain high-risk conditions should receive one or more doses of vaccine.  Hepatitis A vaccine. Adults who wish to be protected from this disease, have certain high-risk conditions, work with hepatitis A-infected animals, work in hepatitis A research labs, or travel to or work in countries with a high rate of hepatitis A should be immunized. Adults who were previously unvaccinated and who anticipate close contact with an international adoptee during the first 60 days after arrival in the [place] from a country with a high rate of hepatitis A should be immunized.  Hepatitis B vaccine. Adults who wish to be protected from this disease, have certain high-risk conditions, may be exposed to blood or other infectious body fluids, are household contacts or sex partners of hepatitis B positive people, are clients or workers in certain care facilities, or travel to or work in countries with a high rate of hepatitis B should be immunized.  Haemophilus influenzae type b (Hib) vaccine. A previously unvaccinated person with asplenia or sickle cell disease or having a scheduled splenectomy should receive 1 dose of Hib vaccine. Regardless of previous immunization, a recipient of a hematopoietic stem cell transplant  should receive a 3-dose series 6-12 months after her successful transplant. Hib vaccine is not recommended for adults with HIV infection. Preventive Services / Frequency Ages 71 to 87 years  Blood pressure check.** / Every 3-5 years.  Lipid and cholesterol check.** / Every 5 years beginning at age 1.  Clinical breast exam.** / Every 3 years for women in their 3s and 31s.  BRCA-related cancer risk assessment.** / For women who have family members with a BRCA-related cancer (breast, ovarian, tubal, or peritoneal cancers).  Pap test.** / Every 2 years from ages 50 through 86. Every 3 years starting at age 87 through age 7 or 75 with a history of 3 consecutive normal Pap tests.  HPV screening.** / Every 3 years from ages 59 through ages 35 to 6 with a history of 3 consecutive normal Pap tests.  Hepatitis C blood test.** / For any individual with known risks for hepatitis C.  Skin self-exam. / Monthly.  Influenza vaccine. / Every year.  Tetanus, diphtheria, and acellular pertussis (Tdap, Td) vaccine.** / Consult your health care provider. Pregnant women should receive 1 dose of Tdap vaccine during each pregnancy. 1 dose of Td every 10 years.  Varicella vaccine.** / Consult your health care provider. Pregnant females who do not have evidence of immunity should receive the first dose after pregnancy.  HPV vaccine. / 3 doses over 6 months, if 72 and younger. The vaccine is not recommended for use in pregnant females. However, pregnancy testing is not needed before receiving a dose.  Measles, mumps, rubella (MMR) vaccine.** / You need at least 1 dose of MMR if you were born in 1957 or later. You may also need a 2nd dose. For females of childbearing age, rubella immunity should be determined. If there is no evidence of immunity, females who are not pregnant should be vaccinated. If there is no evidence of immunity, females who are  pregnant should delay immunization until after  pregnancy.  Pneumococcal 13-valent conjugate (PCV13) vaccine.** / Consult your health care provider.  Pneumococcal polysaccharide (PPSV23) vaccine.** / 1 to 2 doses if you smoke cigarettes or if you have certain conditions.  Meningococcal vaccine.** / 1 dose if you are age 87 to 44 years and a Market researcher living in a residence hall, or have one of several medical conditions, you need to get vaccinated against meningococcal disease. You may also need additional booster doses.  Hepatitis A vaccine.** / Consult your health care provider.  Hepatitis B vaccine.** / Consult your health care provider.  Haemophilus influenzae type b (Hib) vaccine.** / Consult your health care provider. Ages 86 to 38 years  Blood pressure check.** / Every year.  Lipid and cholesterol check.** / Every 5 years beginning at age 49 years.  Lung cancer screening. / Every year if you are aged 71-80 years and have a 30-pack-year history of smoking and currently smoke or have quit within the past 15 years. Yearly screening is stopped once you have quit smoking for at least 15 years or develop a health problem that would prevent you from having lung cancer treatment.  Clinical breast exam.** / Every year after age 51 years.  BRCA-related cancer risk assessment.** / For women who have family members with a BRCA-related cancer (breast, ovarian, tubal, or peritoneal cancers).  Mammogram.** / Every year beginning at age 18 years and continuing for as long as you are in good health. Consult with your health care provider.  Pap test.** / Every 3 years starting at age 63 years through age 37 or 57 years with a history of 3 consecutive normal Pap tests.  HPV screening.** / Every 3 years from ages 41 years through ages 76 to 23 years with a history of 3 consecutive normal Pap tests.  Fecal occult blood test (FOBT) of stool. / Every year beginning at age 36 years and continuing until age 51 years. You may not need  to do this test if you get a colonoscopy every 10 years.  Flexible sigmoidoscopy or colonoscopy.** / Every 5 years for a flexible sigmoidoscopy or every 10 years for a colonoscopy beginning at age 36 years and continuing until age 35 years.  Hepatitis C blood test.** / For all people born from [birth year] through 1965 and any individual with known risks for hepatitis C.  Skin self-exam. / Monthly.  Influenza vaccine. / Every year.  Tetanus, diphtheria, and acellular pertussis (Tdap/Td) vaccine.** / Consult your health care provider. Pregnant women should receive 1 dose of Tdap vaccine during each pregnancy. 1 dose of Td every 10 years.  Varicella vaccine.** / Consult your health care provider. Pregnant females who do not have evidence of immunity should receive the first dose after pregnancy.  Zoster vaccine.** / 1 dose for adults aged 73 years or older.  Measles, mumps, rubella (MMR) vaccine.** / You need at least 1 dose of MMR if you were born in 1957 or later. You may also need a second dose. For females of childbearing age, rubella immunity should be determined. If there is no evidence of immunity, females who are not pregnant should be vaccinated. If there is no evidence of immunity, females who are pregnant should delay immunization until after pregnancy.  Pneumococcal 13-valent conjugate (PCV13) vaccine.** / Consult your health care provider.  Pneumococcal polysaccharide (PPSV23) vaccine.** / 1 to 2 doses if you smoke cigarettes or if you have certain conditions.  Meningococcal vaccine.** /  Consult your health care provider.  Hepatitis A vaccine.** / Consult your health care provider.  Hepatitis B vaccine.** / Consult your health care provider.  Haemophilus influenzae type b (Hib) vaccine.** / Consult your health care provider. Ages 80 years and over  Blood pressure check.** / Every year.  Lipid and cholesterol check.** / Every 5 years beginning at age 62 years.  Lung cancer  screening. / Every year if you are aged 32-80 years and have a 30-pack-year history of smoking and currently smoke or have quit within the past 15 years. Yearly screening is stopped once you have quit smoking for at least 15 years or develop a health problem that would prevent you from having lung cancer treatment.  Clinical breast exam.** / Every year after age 61 years.  BRCA-related cancer risk assessment.** / For women who have family members with a BRCA-related cancer (breast, ovarian, tubal, or peritoneal cancers).  Mammogram.** / Every year beginning at age 39 years and continuing for as long as you are in good health. Consult with your health care provider.  Pap test.** / Every 3 years starting at age 85 years through age 74 or 72 years with 3 consecutive normal Pap tests. Testing can be stopped between 65 and 70 years with 3 consecutive normal Pap tests and no abnormal Pap or HPV tests in the past 10 years.  HPV screening.** / Every 3 years from ages 55 years through ages 67 or 77 years with a history of 3 consecutive normal Pap tests. Testing can be stopped between 65 and 70 years with 3 consecutive normal Pap tests and no abnormal Pap or HPV tests in the past 10 years.  Fecal occult blood test (FOBT) of stool. / Every year beginning at age 81 years and continuing until age 22 years. You may not need to do this test if you get a colonoscopy every 10 years.  Flexible sigmoidoscopy or colonoscopy.** / Every 5 years for a flexible sigmoidoscopy or every 10 years for a colonoscopy beginning at age 67 years and continuing until age 22 years.  Hepatitis C blood test.** / For all people born from [birth year] through 1967 and any individual with known risks for hepatitis C.  Osteoporosis screening.** / A one-time screening for women ages 8 years and over and women at risk for fractures or osteoporosis.  Skin self-exam. / Monthly.  Influenza vaccine. / Every year.  Tetanus, diphtheria, and  acellular pertussis (Tdap/Td) vaccine.** / 1 dose of Td every 10 years.  Varicella vaccine.** / Consult your health care provider.  Zoster vaccine.** / 1 dose for adults aged 56 years or older.  Pneumococcal 13-valent conjugate (PCV13) vaccine.** / Consult your health care provider.  Pneumococcal polysaccharide (PPSV23) vaccine.** / 1 dose for all adults aged 15 years and older.  Meningococcal vaccine.** / Consult your health care provider.  Hepatitis A vaccine.** / Consult your health care provider.  Hepatitis B vaccine.** / Consult your health care provider.  Haemophilus influenzae type b (Hib) vaccine.** / Consult your health care provider. ** Family history and personal history of risk and conditions may change your health care provider's recommendations.   This information is not intended to replace advice given to you by your health care provider. Make sure you discuss any questions you have with your health care provider.   Document Released: 07/15/2001 Document Revised: 06/09/2014 Document Reviewed: 10/14/2010 Elsevier Interactive Patient Education [employer].

## 2015-06-22 LAB — COMPREHENSIVE METABOLIC PANEL
A/G RATIO: 1.7 (ref 1.1–2.5)
ALBUMIN: 4.5 g/dL (ref 3.5–5.5)
ALT: 11 IU/L (ref 0–32)
AST: 15 IU/L (ref 0–40)
Alkaline Phosphatase: 74 IU/L (ref 39–117)
BILIRUBIN TOTAL: 0.5 mg/dL (ref 0.0–1.2)
BUN / CREAT RATIO: 11 (ref 8–20)
BUN: 10 mg/dL (ref 6–20)
CHLORIDE: 104 mmol/L (ref 96–106)
CO2: 21 mmol/L (ref 18–29)
Calcium: 9.3 mg/dL (ref 8.7–10.2)
Creatinine, Ser: 0.89 mg/dL (ref 0.57–1.00)
GFR calc non Af Amer: 90 mL/min/{1.73_m2} (ref >59)
GFR, EST AFRICAN AMERICAN: 103 mL/min/{1.73_m2} (ref >59)
Globulin, Total: 2.6 g/dL (ref 1.5–4.5)
Glucose: 94 mg/dL (ref 65–99)
POTASSIUM: 3.9 mmol/L (ref 3.5–5.2)
Sodium: 140 mmol/L (ref 134–144)
TOTAL PROTEIN: 7.1 g/dL (ref 6.0–8.5)

## 2015-06-22 LAB — CBC
HEMATOCRIT: 36.8 % (ref 34.0–46.6)
HEMOGLOBIN: 12.3 g/dL (ref 11.1–15.9)
MCH: 26.5 pg — AB (ref 26.6–33.0)
MCHC: 33.4 g/dL (ref 31.5–35.7)
MCV: 79 fL (ref 79–97)
Platelets: 254 10*3/uL (ref 150–379)
RBC: 4.65 x10E6/uL (ref 3.77–5.28)
RDW: 14.2 % (ref 12.3–15.4)
WBC: 8.1 10*3/uL (ref 3.4–10.8)

## 2015-06-24 ENCOUNTER — Encounter: Payer: Self-pay | Admitting: Obstetrics and Gynecology

## 2015-06-27 ENCOUNTER — Telehealth: Payer: Self-pay

## 2015-06-27 NOTE — Telephone Encounter (Signed)
-----   Message from Hildred Laser, MD sent at 06/26/2015  5:21 PM EST ----- Patient with normal annual exam.

## 2015-06-27 NOTE — Telephone Encounter (Signed)
Pt informed

## 2015-07-06 ENCOUNTER — Encounter: Payer: BC Managed Care – PPO | Attending: Nephrology | Admitting: Dietician

## 2015-07-06 VITALS — Ht 67.0 in | Wt 207.6 lb

## 2015-07-06 DIAGNOSIS — E669 Obesity, unspecified: Secondary | ICD-10-CM | POA: Insufficient documentation

## 2015-07-06 NOTE — Progress Notes (Signed)
Medical Nutrition Therapy: Visit start time: 1130  end time: 1150  Assessment:  Diagnosis: obesity Medical history changes: no changes Psychosocial issues/ stress concerns: none  Current weight: 207.6lbs  Height: 5'7" Medications, supplement changes: no changes Progress and evaluation: Weight loss of about 7lbs since previous visit on 04/19/15.  Weight was 233.3lbs on 02/20/15 so overall has lost 25.7lbs altogether.          She reports slower weight loss and some plateau over holiday season, but has now resumed more steady weight loss.           She is working to increase regular exercise and is maintaining portion control and healthy food choices.  Physical activity: walking and/or gym at work 1 hour, 3 times per week.  Dietary Intake:  Usual eating pattern includes 3 meals and 1-2 snacks per day. Dining out frequency: not assess today.  Breakfast: fruit smoothie for past 2 weeks (uses recipe) sometimes with yogurt or cheese.  Snack: none Lunch: leftovers or lean cuisine Snack: vegetable or popcorn or nuts Supper: some pasta, restaurant/ leftovers, grilled/ baked chicken, eggs and bacon or waffles.  Snack: sometimes chocolate-covered fruit in snack-size bag.  Beverages: water, sparkling water.   Nutrition Care Education: Topics covered:  Weight control: determining reasonable weight goal, reviewed importance of changes in diet and/or physical activity to promote ongoing weight loss.   Nutritional Diagnosis:  Garland-3.3 Overweight/obesity As related to history of excess calories, intactivity.  As evidenced by patient report.  Intervention: Discussion as noted above.   Did not schedule another in-person follow-up at this time; will follow-up by phone in about 6 weeks.  Education Materials given:    Learner/ who was taught:  . Patient   Level of understanding: Marland Kitchen Verbalizes/ demonstrates competency  Demonstrated degree of understanding via:   Teach back Learning  barriers: . None  Willingness to learn/ readiness for change: . Eager, change in progress  Monitoring and Evaluation:  Dietary intake, exercise, and body weight      follow up: by phone 08/23/15

## 2015-07-06 NOTE — Patient Instructions (Signed)
No new nutrition goals at this time. Keep up your great healthy eating and exercise habits!!

## 2015-08-23 ENCOUNTER — Telehealth: Payer: Self-pay | Admitting: Dietician

## 2015-08-23 NOTE — Telephone Encounter (Signed)
Called patient to check on progress. She reports weight at home this morning was 195lbs. This would represent a loss of another 12lbs since follow-up MNT on 07/05/14.  She feels confident she can continue with some additional weight loss to goal of 190lbs or less. She reports her medication is being tapered off.  She will soon move ahead with plans to have a baby.  No further follow-up needed at this time.

## 2015-08-30 ENCOUNTER — Encounter: Payer: Self-pay | Admitting: Dietician

## 2015-08-30 NOTE — Progress Notes (Signed)
Sent discharge letter to MD following phone follow-up on 08/23/15.

## 2016-12-19 ENCOUNTER — Ambulatory Visit: Admission: RE | Admit: 2016-12-19 | Discharge: 2016-12-19 | Admitting: Obstetrics & Gynecology

## 2016-12-19 DIAGNOSIS — Z3493 Encounter for supervision of normal pregnancy, unspecified, third trimester: Principal | ICD-10-CM

## 2016-12-19 DIAGNOSIS — O321XX0 Maternal care for breech presentation, not applicable or unspecified: Secondary | ICD-10-CM

## 2016-12-31 ENCOUNTER — Ambulatory Visit: Admission: RE | Admit: 2016-12-31 | Discharge: 2016-12-31 | Attending: Women's Health | Admitting: Women's Health

## 2016-12-31 DIAGNOSIS — Z7189 Other specified counseling: Secondary | ICD-10-CM

## 2016-12-31 DIAGNOSIS — Z3403 Encounter for supervision of normal first pregnancy, third trimester: Principal | ICD-10-CM

## 2016-12-31 DIAGNOSIS — A630 Anogenital (venereal) warts: Secondary | ICD-10-CM

## 2017-01-16 ENCOUNTER — Ambulatory Visit
Admission: RE | Admit: 2017-01-16 | Discharge: 2017-01-16 | Disposition: A | Discharge status: Home / Self Care | Attending: Obstetrics & Gynecology | Admitting: Obstetrics & Gynecology

## 2017-01-16 DIAGNOSIS — Z3403 Encounter for supervision of normal first pregnancy, third trimester: Principal | ICD-10-CM

## 2017-01-21 ENCOUNTER — Ambulatory Visit
Admission: RE | Admit: 2017-01-21 | Discharge: 2017-01-21 | Attending: Student in an Organized Health Care Education/Training Program | Admitting: Student in an Organized Health Care Education/Training Program

## 2017-01-21 DIAGNOSIS — O321XX0 Maternal care for breech presentation, not applicable or unspecified: Principal | ICD-10-CM

## 2017-01-30 ENCOUNTER — Inpatient Hospital Stay
Admission: RE | Admit: 2017-01-30 | Discharge: 2017-02-02 | Disposition: A | Discharge status: Home / Self Care | Attending: Student in an Organized Health Care Education/Training Program | Admitting: Student in an Organized Health Care Education/Training Program

## 2017-01-30 ENCOUNTER — Inpatient Hospital Stay: Admission: RE | Admit: 2017-01-30 | Discharge: 2017-02-02 | Disposition: A | Discharge status: Home / Self Care

## 2017-01-30 ENCOUNTER — Ambulatory Visit: Admission: RE | Admit: 2017-01-30 | Discharge: 2017-01-30 | Admitting: Nurse Practitioner

## 2017-01-30 DIAGNOSIS — Z3403 Encounter for supervision of normal first pregnancy, third trimester: Principal | ICD-10-CM

## 2017-01-30 DIAGNOSIS — O321XX0 Maternal care for breech presentation, not applicable or unspecified: Principal | ICD-10-CM

## 2017-01-30 DIAGNOSIS — G932 Benign intracranial hypertension: Secondary | ICD-10-CM

## 2017-01-30 DIAGNOSIS — O133 Gestational [pregnancy-induced] hypertension without significant proteinuria, third trimester: Secondary | ICD-10-CM

## 2017-02-02 MED ORDER — DOCUSATE SODIUM 100 MG CAPSULE
ORAL_CAPSULE | Freq: Two times a day (BID) | ORAL | 2 refills | 0.00000 days | Status: CP | PRN
Start: 2017-02-02 — End: 2017-02-27

## 2017-02-02 MED ORDER — ACETAMINOPHEN 325 MG TABLET
ORAL_TABLET | Freq: Four times a day (QID) | ORAL | 2 refills | 0 days | Status: CP
Start: 2017-02-02 — End: 2017-02-27

## 2017-02-02 MED ORDER — POLYETHYLENE GLYCOL 3350 17 GRAM ORAL POWDER PACKET
PACK | Freq: Every day | ORAL | 0 refills | 0 days | Status: CP
Start: 2017-02-02 — End: 2017-02-05

## 2017-02-02 MED ORDER — OXYCODONE 5 MG TABLET
ORAL_TABLET | ORAL | 0 refills | 0.00000 days | Status: CP | PRN
Start: 2017-02-02 — End: 2017-02-07

## 2017-02-06 ENCOUNTER — Ambulatory Visit: Admission: RE | Admit: 2017-02-06 | Discharge: 2017-02-06

## 2017-02-10 ENCOUNTER — Ambulatory Visit: Admission: RE | Admit: 2017-02-10 | Discharge: 2017-02-10

## 2017-02-27 ENCOUNTER — Ambulatory Visit: Admission: RE | Admit: 2017-02-27 | Discharge: 2017-02-27 | Attending: Women's Health | Admitting: Women's Health

## 2017-02-27 DIAGNOSIS — O139 Gestational [pregnancy-induced] hypertension without significant proteinuria, unspecified trimester: Secondary | ICD-10-CM

## 2017-03-31 IMAGING — CT CT GUIDANCE NEEDLE PLACEMENT
1 of 3 series · 15 of 32 positions shown, 19 images · non-contrast
Comparison: none

CLINICAL DATA: PAPILLEDEMA AND INTRACRANIAL HYPERTENSION

[Series 4: osteo bx bx 2.4 b70s · axial · 0.34mm/px · z∈[-836,-824]mm · 15 of 66 slices shown, 19 images]
[im 3/66  soft-tissue]
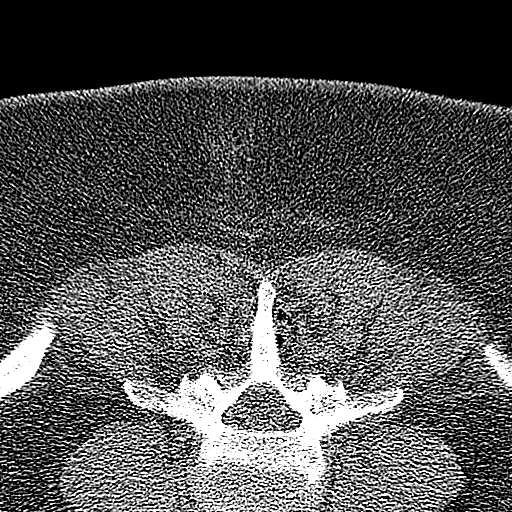
[im 3/66  bone]
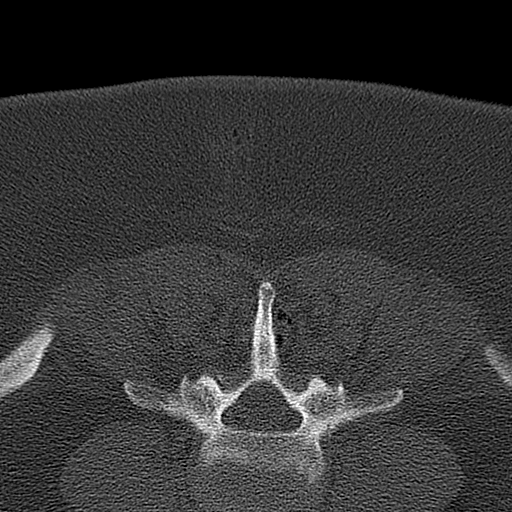
[im 9/66  soft-tissue]
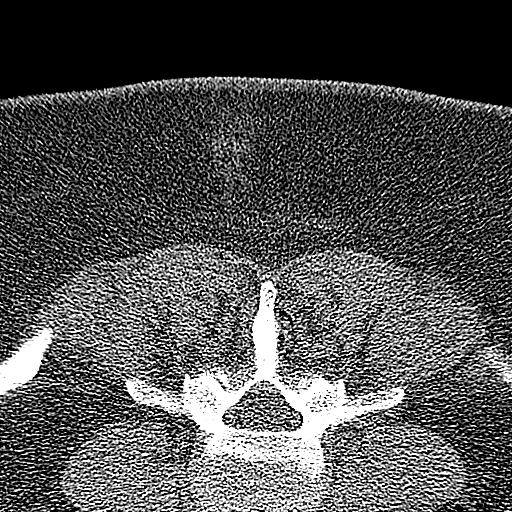
[im 14/66  soft-tissue]
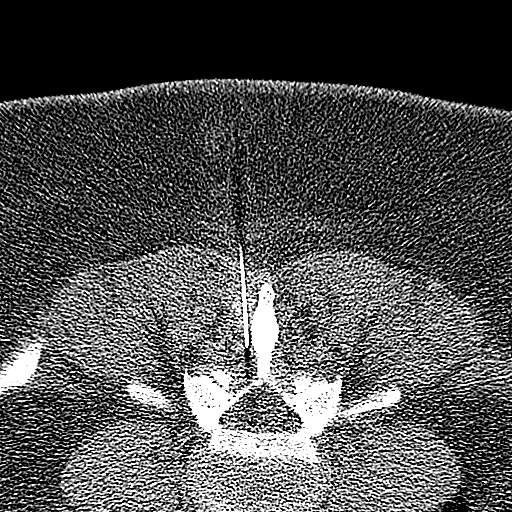
[im 19/66  soft-tissue]
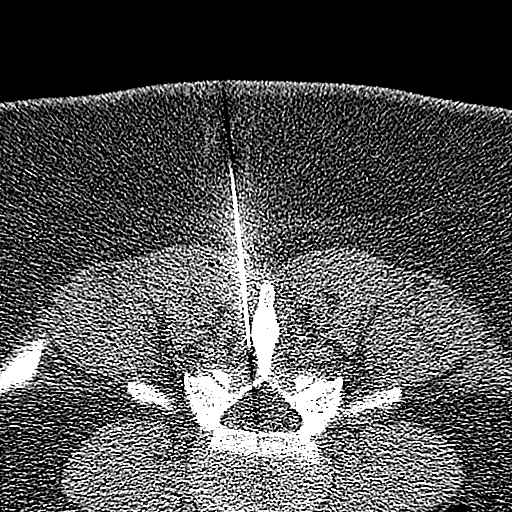
[im 22/66  soft-tissue]
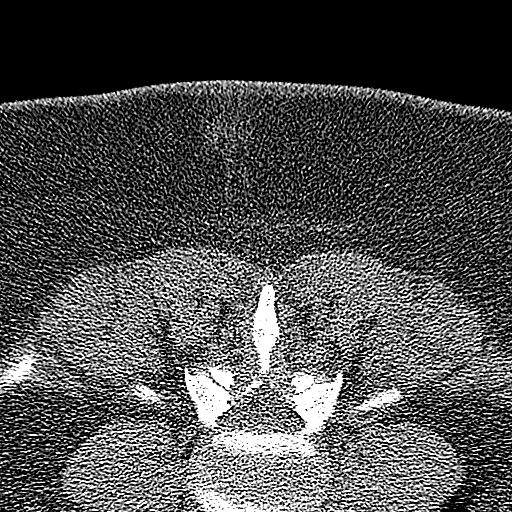
[im 28/66  soft-tissue]
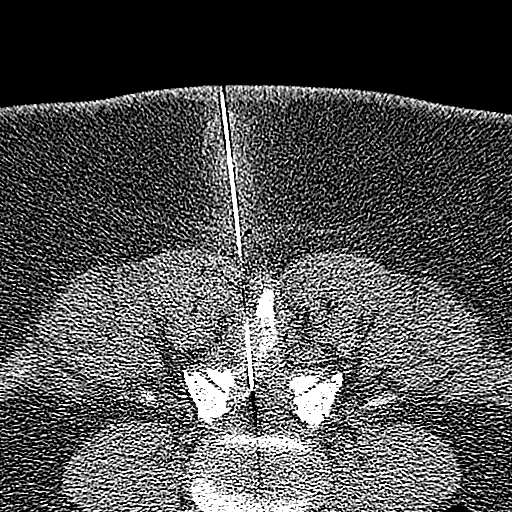
[im 33/66  soft-tissue]
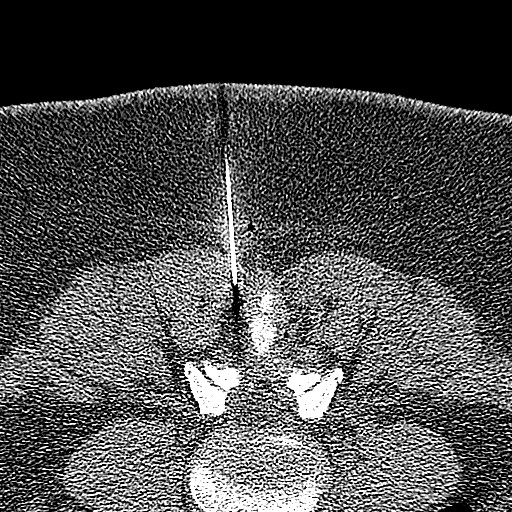
[im 38/66  soft-tissue]
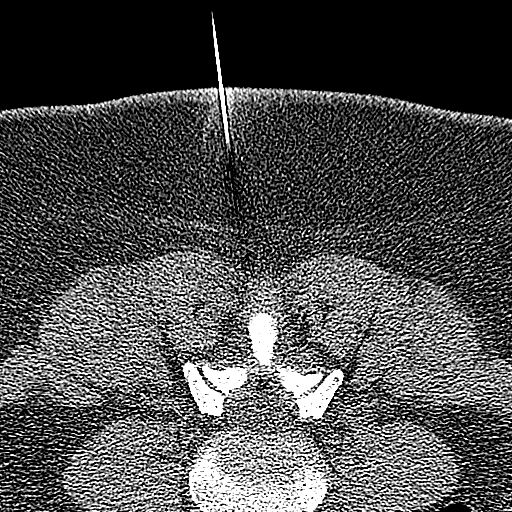
[im 44/66  soft-tissue]
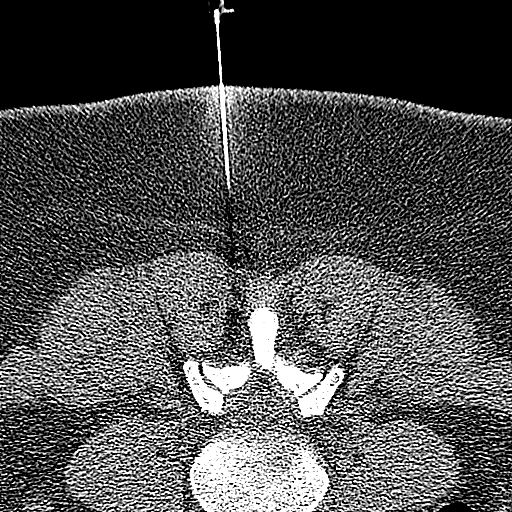
[im 44/66  bone]
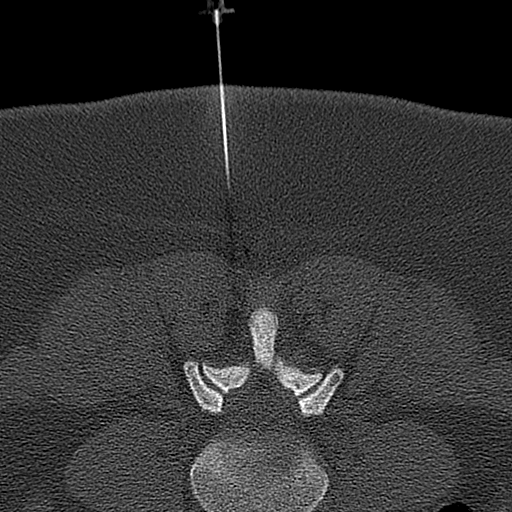
[im 47/66  soft-tissue]
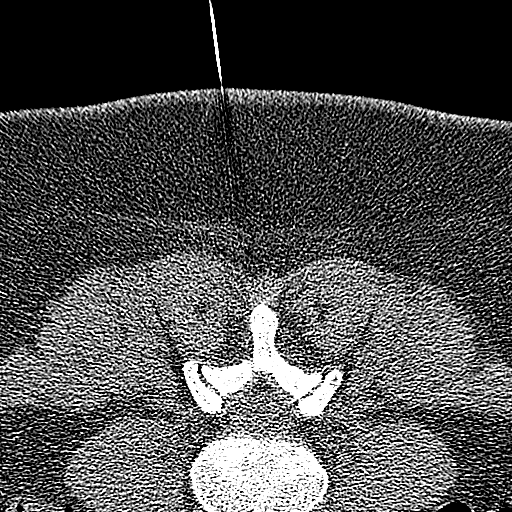
[im 52/66  soft-tissue]
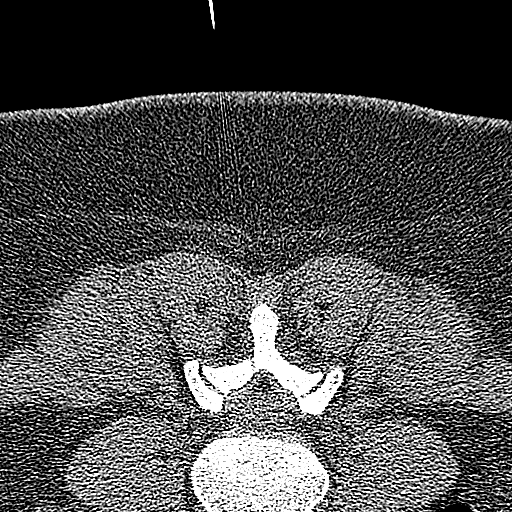
[im 55/66  lung]
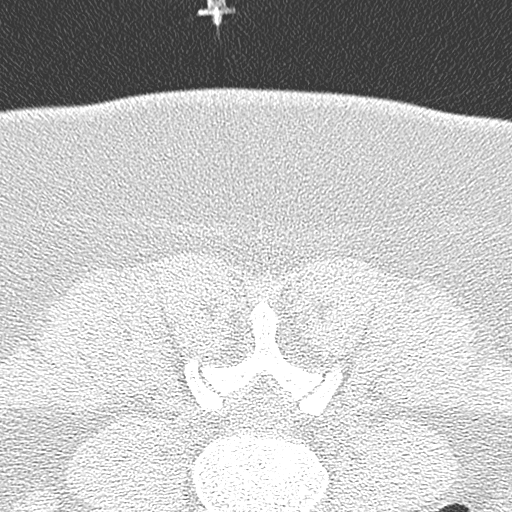
[im 57/66  soft-tissue]
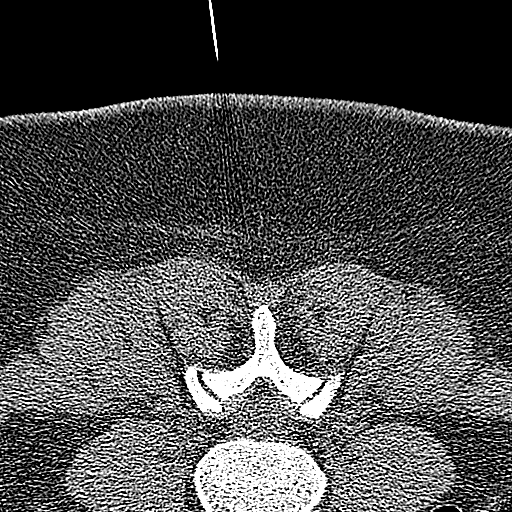
[im 57/66  lung]
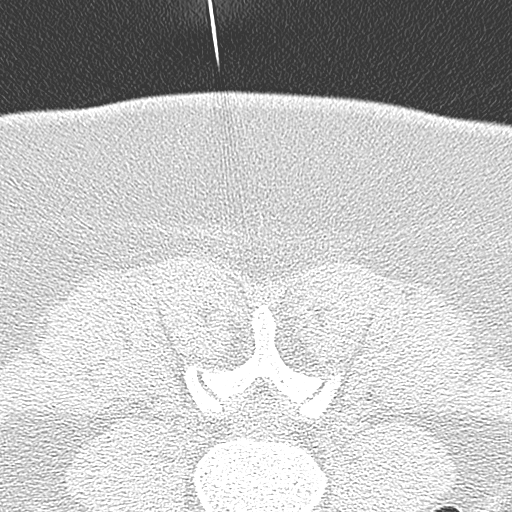
[im 60/66  lung]
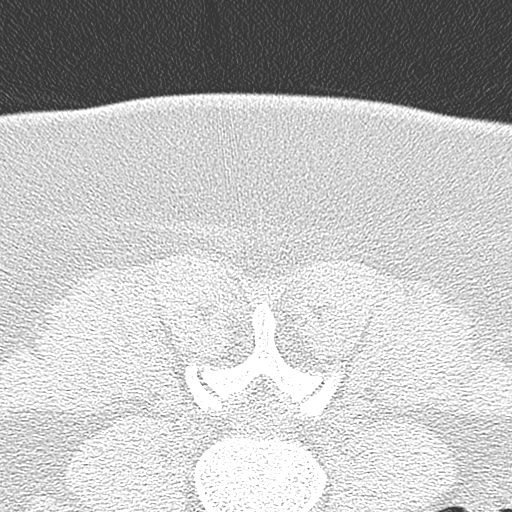
[im 63/66  soft-tissue]
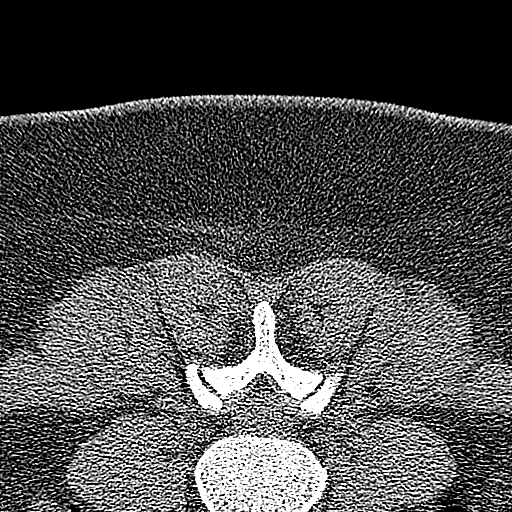
[im 63/66  lung]
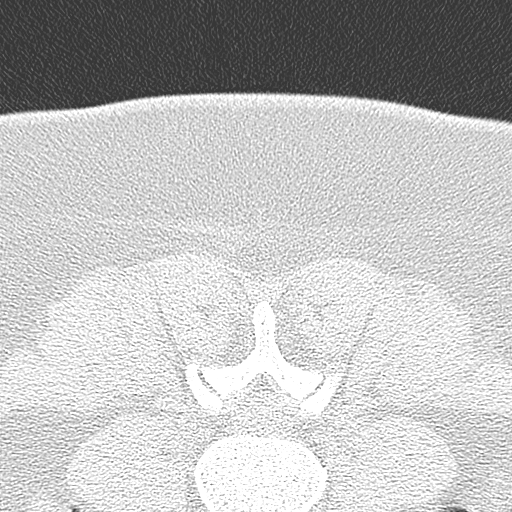

[15 of 32 positions shown; findings below may reference images not displayed]

EXAM:
DIAGNOSTIC LUMBAR PUNCTURE UNDER FLUOROSCOPIC AND CT GUIDANCE

FLUOROSCOPY TIME:  Radiation Exposure Index (as provided by the
fluoroscopic device): 42.7 mGy, CT dose 390 mGy

If the device does not provide the exposure index:

Fluoroscopy Time (in minutes and seconds):  1 minutes 36 seconds

Number of Acquired Images:  1

PROCEDURE:
Informed consent was obtained from the patient prior to the
procedure, including potential complications of headache, allergy,
and pain. With the patient prone, the lower back was prepped with
Betadine. 1% Lidocaine was used for local anesthesia. Lumbar
puncture was performed at the L3-4 level using a 20 gauge needle
although no CSF could be obtained. A second puncture was obtained at
the L4-5 level with similar results. For this reason the patient was
then brought to the CT suite and prepped and draped in the usual
sterile manner with Betadine. Utilizing CT guidance a 20 gauge
spinal needle was placed into the lumbar thecal sac at the L4-5
level. There was return of clear CSF with an opening pressure of
14.5 cm water. Seventeen ml of CSF were obtained for laboratory
studies. The closing pressure was noted to be 3 cm of water. The
patient tolerated the procedure well and there were no apparent
complications.
IMPRESSION: Successful lumbar puncture under CT guidance following failed
attempts under fluoroscopic guidance. A CSF was sent for laboratory
evaluations as ordered by the primary physician.

## 2018-04-26 ENCOUNTER — Ambulatory Visit: Admit: 2018-04-26 | Discharge: 2018-04-27 | Payer: PRIVATE HEALTH INSURANCE

## 2018-04-26 DIAGNOSIS — O4690 Antepartum hemorrhage, unspecified, unspecified trimester: Principal | ICD-10-CM

## 2018-07-27 ENCOUNTER — Other Ambulatory Visit
Admit: 2018-07-27 | Discharge: 2018-07-28 | Payer: PRIVATE HEALTH INSURANCE | Attending: "Women's Health Care | Primary: "Women's Health Care

## 2018-07-27 ENCOUNTER — Ambulatory Visit: Admit: 2018-07-27 | Discharge: 2018-07-28 | Payer: PRIVATE HEALTH INSURANCE

## 2018-07-27 DIAGNOSIS — O3680X0 Pregnancy with inconclusive fetal viability, not applicable or unspecified: Principal | ICD-10-CM

## 2018-07-27 DIAGNOSIS — O0991 Supervision of high risk pregnancy, unspecified, first trimester: Secondary | ICD-10-CM

## 2018-07-27 DIAGNOSIS — O99211 Obesity complicating pregnancy, first trimester: Principal | ICD-10-CM

## 2018-08-24 ENCOUNTER — Ambulatory Visit
Admit: 2018-08-24 | Discharge: 2018-08-25 | Payer: PRIVATE HEALTH INSURANCE | Attending: Women's Health | Primary: Women's Health

## 2018-08-24 DIAGNOSIS — I10 Essential (primary) hypertension: Principal | ICD-10-CM

## 2018-08-24 DIAGNOSIS — O139 Gestational [pregnancy-induced] hypertension without significant proteinuria, unspecified trimester: Principal | ICD-10-CM

## 2018-08-24 DIAGNOSIS — G932 Benign intracranial hypertension: Principal | ICD-10-CM

## 2018-08-24 DIAGNOSIS — G08 Intracranial and intraspinal phlebitis and thrombophlebitis: Principal | ICD-10-CM

## 2018-08-24 DIAGNOSIS — O99210 Obesity complicating pregnancy, unspecified trimester: Principal | ICD-10-CM

## 2018-08-24 DIAGNOSIS — R51 Headache: Principal | ICD-10-CM

## 2018-08-24 DIAGNOSIS — N390 Urinary tract infection, site not specified: Principal | ICD-10-CM

## 2018-08-24 DIAGNOSIS — N200 Calculus of kidney: Principal | ICD-10-CM

## 2018-08-24 DIAGNOSIS — N12 Tubulo-interstitial nephritis, not specified as acute or chronic: Principal | ICD-10-CM

## 2018-08-24 DIAGNOSIS — L709 Acne, unspecified: Principal | ICD-10-CM

## 2018-08-24 DIAGNOSIS — E538 Deficiency of other specified B group vitamins: Principal | ICD-10-CM

## 2018-08-24 DIAGNOSIS — B019 Varicella without complication: Principal | ICD-10-CM

## 2018-08-24 DIAGNOSIS — O0991 Supervision of high risk pregnancy, unspecified, first trimester: Principal | ICD-10-CM

## 2018-08-24 DIAGNOSIS — D649 Anemia, unspecified: Principal | ICD-10-CM

## 2018-10-19 ENCOUNTER — Institutional Professional Consult (permissible substitution): Admit: 2018-10-19 | Discharge: 2018-10-20 | Payer: PRIVATE HEALTH INSURANCE

## 2018-10-19 ENCOUNTER — Ambulatory Visit: Admit: 2018-10-19 | Discharge: 2018-10-20 | Payer: PRIVATE HEALTH INSURANCE

## 2018-10-19 DIAGNOSIS — O0991 Supervision of high risk pregnancy, unspecified, first trimester: Principal | ICD-10-CM

## 2018-10-19 DIAGNOSIS — O99210 Obesity complicating pregnancy, unspecified trimester: Principal | ICD-10-CM

## 2018-10-21 ENCOUNTER — Telehealth
Admit: 2018-10-21 | Discharge: 2018-10-22 | Payer: PRIVATE HEALTH INSURANCE | Attending: Women's Health | Primary: Women's Health

## 2018-10-21 ENCOUNTER — Ambulatory Visit
Admit: 2018-10-21 | Discharge: 2018-10-22 | Payer: PRIVATE HEALTH INSURANCE | Attending: Obstetrics & Gynecology | Primary: Obstetrics & Gynecology

## 2018-10-21 DIAGNOSIS — O0991 Supervision of high risk pregnancy, unspecified, first trimester: Principal | ICD-10-CM

## 2018-10-21 DIAGNOSIS — G932 Benign intracranial hypertension: Secondary | ICD-10-CM

## 2018-10-21 MED ORDER — ONDANSETRON HCL 4 MG TABLET
ORAL_TABLET | Freq: Every day | ORAL | 1 refills | 0 days | Status: CP | PRN
Start: 2018-10-21 — End: 2019-10-21

## 2018-10-21 MED ORDER — CLINDAMYCIN 1 % LOTION
Freq: Two times a day (BID) | TOPICAL | 1 refills | 0.00000 days | Status: CP
Start: 2018-10-21 — End: 2019-10-21

## 2018-11-19 ENCOUNTER — Ambulatory Visit: Admit: 2018-11-19 | Discharge: 2018-11-20 | Payer: PRIVATE HEALTH INSURANCE

## 2018-11-19 DIAGNOSIS — O99210 Obesity complicating pregnancy, unspecified trimester: Principal | ICD-10-CM

## 2018-11-22 ENCOUNTER — Telehealth
Admit: 2018-11-22 | Discharge: 2018-11-23 | Payer: PRIVATE HEALTH INSURANCE | Attending: Nurse Practitioner | Primary: Nurse Practitioner

## 2018-11-22 DIAGNOSIS — O0991 Supervision of high risk pregnancy, unspecified, first trimester: Principal | ICD-10-CM

## 2018-12-14 ENCOUNTER — Ambulatory Visit: Admit: 2018-12-14 | Discharge: 2018-12-15 | Payer: PRIVATE HEALTH INSURANCE

## 2018-12-14 DIAGNOSIS — O99210 Obesity complicating pregnancy, unspecified trimester: Secondary | ICD-10-CM

## 2018-12-14 DIAGNOSIS — O0993 Supervision of high risk pregnancy, unspecified, third trimester: Principal | ICD-10-CM

## 2018-12-30 ENCOUNTER — Ambulatory Visit
Admit: 2018-12-30 | Discharge: 2018-12-31 | Payer: PRIVATE HEALTH INSURANCE | Attending: Obstetrics & Gynecology | Primary: Obstetrics & Gynecology

## 2018-12-30 DIAGNOSIS — O99210 Obesity complicating pregnancy, unspecified trimester: Secondary | ICD-10-CM

## 2018-12-30 DIAGNOSIS — Z98891 History of uterine scar from previous surgery: Secondary | ICD-10-CM

## 2018-12-30 DIAGNOSIS — O0993 Supervision of high risk pregnancy, unspecified, third trimester: Principal | ICD-10-CM

## 2019-01-14 ENCOUNTER — Ambulatory Visit
Admit: 2019-01-14 | Discharge: 2019-01-15 | Payer: PRIVATE HEALTH INSURANCE | Attending: Women's Health | Primary: Women's Health

## 2019-01-14 DIAGNOSIS — O0993 Supervision of high risk pregnancy, unspecified, third trimester: Principal | ICD-10-CM

## 2019-01-19 ENCOUNTER — Ambulatory Visit
Admit: 2019-01-19 | Discharge: 2019-01-20 | Payer: PRIVATE HEALTH INSURANCE | Attending: Student in an Organized Health Care Education/Training Program | Primary: Student in an Organized Health Care Education/Training Program

## 2019-01-19 DIAGNOSIS — O133 Gestational [pregnancy-induced] hypertension without significant proteinuria, third trimester: Secondary | ICD-10-CM

## 2019-01-19 DIAGNOSIS — O0993 Supervision of high risk pregnancy, unspecified, third trimester: Principal | ICD-10-CM

## 2019-01-21 ENCOUNTER — Ambulatory Visit: Admit: 2019-01-21 | Discharge: 2019-01-22 | Payer: PRIVATE HEALTH INSURANCE

## 2019-01-21 DIAGNOSIS — O133 Gestational [pregnancy-induced] hypertension without significant proteinuria, third trimester: Secondary | ICD-10-CM

## 2019-01-21 DIAGNOSIS — O0993 Supervision of high risk pregnancy, unspecified, third trimester: Principal | ICD-10-CM

## 2019-01-24 ENCOUNTER — Ambulatory Visit
Admit: 2019-01-24 | Discharge: 2019-01-25 | Payer: PRIVATE HEALTH INSURANCE | Attending: Women's Health | Primary: Women's Health

## 2019-01-24 DIAGNOSIS — O0993 Supervision of high risk pregnancy, unspecified, third trimester: Principal | ICD-10-CM

## 2019-01-24 DIAGNOSIS — O133 Gestational [pregnancy-induced] hypertension without significant proteinuria, third trimester: Secondary | ICD-10-CM

## 2019-01-26 ENCOUNTER — Ambulatory Visit: Admit: 2019-01-26 | Discharge: 2019-01-27 | Payer: PRIVATE HEALTH INSURANCE

## 2019-01-26 ENCOUNTER — Ambulatory Visit
Admit: 2019-01-26 | Discharge: 2019-01-27 | Payer: PRIVATE HEALTH INSURANCE | Attending: Student in an Organized Health Care Education/Training Program | Primary: Student in an Organized Health Care Education/Training Program

## 2019-01-26 DIAGNOSIS — O0993 Supervision of high risk pregnancy, unspecified, third trimester: Principal | ICD-10-CM

## 2019-01-26 DIAGNOSIS — O133 Gestational [pregnancy-induced] hypertension without significant proteinuria, third trimester: Secondary | ICD-10-CM

## 2019-01-28 ENCOUNTER — Ambulatory Visit
Admit: 2019-01-28 | Discharge: 2019-01-29 | Payer: PRIVATE HEALTH INSURANCE | Attending: Obstetrics & Gynecology | Primary: Obstetrics & Gynecology

## 2019-01-28 DIAGNOSIS — O0993 Supervision of high risk pregnancy, unspecified, third trimester: Secondary | ICD-10-CM

## 2019-01-28 DIAGNOSIS — O99210 Obesity complicating pregnancy, unspecified trimester: Secondary | ICD-10-CM

## 2019-01-28 DIAGNOSIS — O34219 Maternal care for unspecified type scar from previous cesarean delivery: Principal | ICD-10-CM

## 2019-02-01 ENCOUNTER — Ambulatory Visit: Admit: 2019-02-01 | Discharge: 2019-02-01 | Payer: PRIVATE HEALTH INSURANCE

## 2019-02-01 ENCOUNTER — Ambulatory Visit
Admit: 2019-02-01 | Discharge: 2019-02-01 | Payer: PRIVATE HEALTH INSURANCE | Attending: Women's Health | Primary: Women's Health

## 2019-02-01 DIAGNOSIS — O0993 Supervision of high risk pregnancy, unspecified, third trimester: Secondary | ICD-10-CM

## 2019-02-01 DIAGNOSIS — O34219 Maternal care for unspecified type scar from previous cesarean delivery: Secondary | ICD-10-CM

## 2019-02-01 DIAGNOSIS — O99210 Obesity complicating pregnancy, unspecified trimester: Secondary | ICD-10-CM

## 2019-02-04 ENCOUNTER — Ambulatory Visit: Admit: 2019-02-04 | Discharge: 2019-02-05 | Payer: PRIVATE HEALTH INSURANCE

## 2019-02-04 ENCOUNTER — Ambulatory Visit
Admit: 2019-02-04 | Discharge: 2019-02-05 | Payer: PRIVATE HEALTH INSURANCE | Attending: Women's Health | Primary: Women's Health

## 2019-02-04 DIAGNOSIS — O0993 Supervision of high risk pregnancy, unspecified, third trimester: Secondary | ICD-10-CM

## 2019-02-04 DIAGNOSIS — O133 Gestational [pregnancy-induced] hypertension without significant proteinuria, third trimester: Secondary | ICD-10-CM

## 2019-02-04 DIAGNOSIS — Z23 Encounter for immunization: Secondary | ICD-10-CM

## 2019-02-08 ENCOUNTER — Institutional Professional Consult (permissible substitution): Admit: 2019-02-08 | Payer: PRIVATE HEALTH INSURANCE

## 2019-02-08 ENCOUNTER — Ambulatory Visit: Admit: 2019-02-08 | Discharge: 2019-02-09 | Payer: PRIVATE HEALTH INSURANCE

## 2019-02-08 DIAGNOSIS — O133 Gestational [pregnancy-induced] hypertension without significant proteinuria, third trimester: Secondary | ICD-10-CM

## 2019-02-11 ENCOUNTER — Ambulatory Visit
Admit: 2019-02-11 | Discharge: 2019-02-11 | Payer: PRIVATE HEALTH INSURANCE | Attending: Women's Health | Primary: Women's Health

## 2019-02-11 ENCOUNTER — Ambulatory Visit: Admit: 2019-02-11 | Discharge: 2019-02-11 | Payer: PRIVATE HEALTH INSURANCE

## 2019-02-11 DIAGNOSIS — O133 Gestational [pregnancy-induced] hypertension without significant proteinuria, third trimester: Secondary | ICD-10-CM

## 2019-02-11 DIAGNOSIS — O0993 Supervision of high risk pregnancy, unspecified, third trimester: Secondary | ICD-10-CM

## 2019-02-12 ENCOUNTER — Ambulatory Visit: Admit: 2019-02-12 | Discharge: 2019-02-13 | Payer: PRIVATE HEALTH INSURANCE

## 2019-02-12 DIAGNOSIS — Z20828 Contact with and (suspected) exposure to other viral communicable diseases: Secondary | ICD-10-CM

## 2019-02-12 DIAGNOSIS — Z01812 Encounter for preprocedural laboratory examination: Secondary | ICD-10-CM

## 2019-02-12 DIAGNOSIS — Z1159 Encounter for screening for other viral diseases: Secondary | ICD-10-CM

## 2019-02-15 ENCOUNTER — Encounter
Admit: 2019-02-15 | Discharge: 2019-02-20 | Disposition: A | Discharge status: Home / Self Care | Payer: PRIVATE HEALTH INSURANCE | Attending: Student in an Organized Health Care Education/Training Program | Admitting: Obstetrics & Gynecology

## 2019-02-15 ENCOUNTER — Ambulatory Visit
Admit: 2019-02-15 | Discharge: 2019-02-20 | Disposition: A | Discharge status: Home / Self Care | Payer: PRIVATE HEALTH INSURANCE | Admitting: Obstetrics & Gynecology

## 2019-02-15 DIAGNOSIS — O34211 Maternal care for low transverse scar from previous cesarean delivery: Secondary | ICD-10-CM

## 2019-02-15 DIAGNOSIS — O134 Gestational [pregnancy-induced] hypertension without significant proteinuria, complicating childbirth: Secondary | ICD-10-CM

## 2019-02-15 DIAGNOSIS — E669 Obesity, unspecified: Secondary | ICD-10-CM

## 2019-02-15 DIAGNOSIS — O9902 Anemia complicating childbirth: Secondary | ICD-10-CM

## 2019-02-15 DIAGNOSIS — D649 Anemia, unspecified: Secondary | ICD-10-CM

## 2019-02-15 DIAGNOSIS — O133 Gestational [pregnancy-induced] hypertension without significant proteinuria, third trimester: Secondary | ICD-10-CM

## 2019-02-15 DIAGNOSIS — Z3A37 37 weeks gestation of pregnancy: Secondary | ICD-10-CM

## 2019-02-15 DIAGNOSIS — O99214 Obesity complicating childbirth: Secondary | ICD-10-CM

## 2019-02-19 MED ORDER — OXYCODONE 5 MG TABLET
ORAL_TABLET | ORAL | 0 refills | 4 days | Status: CP | PRN
Start: 2019-02-19 — End: 2019-02-24

## 2019-02-19 MED ORDER — FERROUS SULFATE 325 MG (65 MG IRON) TABLET
ORAL_TABLET | Freq: Every day | ORAL | 3 refills | 90.00000 days | Status: CP
Start: 2019-02-19 — End: 2020-02-19

## 2019-02-19 MED ORDER — IBUPROFEN 600 MG TABLET
ORAL_TABLET | Freq: Four times a day (QID) | ORAL | 2 refills | 8.00000 days | Status: CP | PRN
Start: 2019-02-19 — End: ?

## 2019-02-19 MED ORDER — ACETAMINOPHEN 325 MG TABLET
ORAL_TABLET | Freq: Four times a day (QID) | ORAL | 2 refills | 8.00000 days | Status: CP | PRN
Start: 2019-02-19 — End: ?

## 2019-02-19 MED ORDER — NORETHINDRONE (CONTRACEPTIVE) 0.35 MG TABLET
PACK | Freq: Every day | ORAL | 3 refills | 0 days | Status: CP
Start: 2019-02-19 — End: 2020-02-19

## 2019-02-19 MED ORDER — LIDOCAINE 4 % TOPICAL PATCH
MEDICATED_PATCH | Freq: Every day | TRANSDERMAL | 0 refills | 5 days | Status: CP
Start: 2019-02-19 — End: ?

## 2019-02-19 MED ORDER — POLYETHYLENE GLYCOL 3350 17 GRAM ORAL POWDER PACKET
PACK | Freq: Every day | ORAL | 2 refills | 30.00000 days | Status: CP
Start: 2019-02-19 — End: 2019-03-21

## 2019-02-20 MED ORDER — SOTRADECOL 1 % IV SOLN
325.00 | INTRAVENOUS | Status: DC
Start: 2019-02-21 — End: 2019-02-20

## 2019-02-20 MED ORDER — RICOLA HERB MT
80.00 | OROMUCOSAL | Status: DC
Start: ? — End: 2019-02-20

## 2019-02-20 MED ORDER — SELECT BRAND INSULIN SYRINGE 29G X 1/2" 1 ML MISC
1.00 | Status: DC
Start: 2019-02-21 — End: 2019-02-20

## 2019-02-20 MED ORDER — QUINERVA 260 MG PO TABS
650.00 | ORAL_TABLET | ORAL | Status: DC
Start: 2019-02-20 — End: 2019-02-20

## 2019-02-20 MED ORDER — GLUCOSAMINE-CHONDROIT-COLLAGEN PO
100.00 | ORAL | Status: DC
Start: 2019-02-20 — End: 2019-02-20

## 2019-02-20 MED ORDER — ISOVUE-M 300 61 % IJ SOLN
4.00 | INTRAMUSCULAR | Status: DC
Start: ? — End: 2019-02-20

## 2019-02-20 MED ORDER — FUTURO SOFT CERVICAL COLLAR MISC
600.00 | Status: DC
Start: 2019-02-20 — End: 2019-02-20

## 2019-02-20 MED ORDER — DIPHENHYDRAMINE HCL 25 MG PO CAPS
25.00 | ORAL_CAPSULE | ORAL | Status: DC
Start: ? — End: 2019-02-20

## 2019-02-20 MED ORDER — SELECT BRAND INSULIN SYRINGE 29G X 1/2" 1 ML MISC
1.00 | Status: DC
Start: ? — End: 2019-02-20

## 2019-02-20 MED ORDER — RUBIDIUM CHLORIDE POWD
1.00 | Status: DC
Start: 2019-02-21 — End: 2019-02-20

## 2019-02-20 MED ORDER — Medication
Status: DC
Start: ? — End: 2019-02-20

## 2019-02-20 MED ORDER — PROMETHAZINE HCL 50 MG RE SUPP [COMPILED RECORD] [AGE PEDIATRIC]
0.40 | RECTAL | Status: DC
Start: ? — End: 2019-02-20

## 2019-02-24 ENCOUNTER — Institutional Professional Consult (permissible substitution): Admit: 2019-02-24 | Discharge: 2019-02-25 | Payer: PRIVATE HEALTH INSURANCE

## 2019-03-03 MED ORDER — CYCLOBENZAPRINE 5 MG TABLET
ORAL_TABLET | Freq: Three times a day (TID) | ORAL | 0 refills | 4.00000 days | Status: CP | PRN
Start: 2019-03-03 — End: ?

## 2019-03-17 ENCOUNTER — Ambulatory Visit
Admit: 2019-03-17 | Discharge: 2019-03-18 | Payer: PRIVATE HEALTH INSURANCE | Attending: Nurse Practitioner | Primary: Nurse Practitioner

## 2019-03-17 ENCOUNTER — Other Ambulatory Visit: Admit: 2019-03-17 | Discharge: 2019-03-18 | Payer: PRIVATE HEALTH INSURANCE

## 2019-03-17 DIAGNOSIS — O0993 Supervision of high risk pregnancy, unspecified, third trimester: Principal | ICD-10-CM

## 2019-05-03 ENCOUNTER — Ambulatory Visit
Admit: 2019-05-03 | Discharge: 2019-05-03 | Payer: PRIVATE HEALTH INSURANCE | Attending: Women's Health | Primary: Women's Health

## 2019-05-03 ENCOUNTER — Institutional Professional Consult (permissible substitution): Admit: 2019-05-03 | Discharge: 2019-05-03 | Payer: PRIVATE HEALTH INSURANCE

## 2019-05-03 DIAGNOSIS — R002 Palpitations: Principal | ICD-10-CM

## 2019-05-06 ENCOUNTER — Telehealth
Admit: 2019-05-06 | Discharge: 2019-05-07 | Payer: PRIVATE HEALTH INSURANCE | Attending: Internal Medicine | Primary: Internal Medicine

## 2019-05-06 DIAGNOSIS — F419 Anxiety disorder, unspecified: Principal | ICD-10-CM

## 2019-05-06 DIAGNOSIS — R002 Palpitations: Principal | ICD-10-CM

## 2019-05-06 DIAGNOSIS — K219 Gastro-esophageal reflux disease without esophagitis: Principal | ICD-10-CM

## 2019-05-06 MED ORDER — ESCITALOPRAM 5 MG TABLET
ORAL_TABLET | Freq: Every day | ORAL | 11 refills | 30.00000 days | Status: CP
Start: 2019-05-06 — End: 2019-08-04

## 2019-05-20 ENCOUNTER — Telehealth
Admit: 2019-05-20 | Discharge: 2019-05-21 | Payer: PRIVATE HEALTH INSURANCE | Attending: Internal Medicine | Primary: Internal Medicine

## 2019-05-20 DIAGNOSIS — F419 Anxiety disorder, unspecified: Principal | ICD-10-CM

## 2019-05-25 ENCOUNTER — Ambulatory Visit: Admit: 2019-05-25 | Discharge: 2019-05-26 | Payer: PRIVATE HEALTH INSURANCE

## 2019-05-25 DIAGNOSIS — R002 Palpitations: Principal | ICD-10-CM

## 2019-06-01 DIAGNOSIS — L709 Acne, unspecified: Principal | ICD-10-CM

## 2019-06-01 MED ORDER — CLINDAMYCIN 1 % LOTION
Freq: Two times a day (BID) | TOPICAL | 6 refills | 0 days | Status: CP
Start: 2019-06-01 — End: 2020-05-31

## 2019-06-08 ENCOUNTER — Telehealth: Admit: 2019-06-08 | Discharge: 2019-06-09 | Payer: PRIVATE HEALTH INSURANCE | Attending: Clinical | Primary: Clinical

## 2019-06-13 ENCOUNTER — Ambulatory Visit: Admit: 2019-06-13 | Discharge: 2019-06-14 | Payer: PRIVATE HEALTH INSURANCE | Attending: Family | Primary: Family

## 2019-06-20 DIAGNOSIS — F419 Anxiety disorder, unspecified: Principal | ICD-10-CM

## 2019-06-20 MED ORDER — ESCITALOPRAM 10 MG TABLET
ORAL_TABLET | Freq: Every day | ORAL | 11 refills | 30.00000 days | Status: CP
Start: 2019-06-20 — End: 2019-09-18

## 2019-06-29 ENCOUNTER — Telehealth: Admit: 2019-06-29 | Discharge: 2019-06-30 | Payer: PRIVATE HEALTH INSURANCE | Attending: Clinical | Primary: Clinical

## 2019-07-25 ENCOUNTER — Telehealth: Admit: 2019-07-25 | Discharge: 2019-07-26 | Payer: PRIVATE HEALTH INSURANCE | Attending: Clinical | Primary: Clinical

## 2019-08-15 ENCOUNTER — Telehealth: Admit: 2019-08-15 | Discharge: 2019-08-16 | Payer: PRIVATE HEALTH INSURANCE | Attending: Clinical | Primary: Clinical

## 2019-09-05 ENCOUNTER — Telehealth: Admit: 2019-09-05 | Discharge: 2019-09-06 | Payer: PRIVATE HEALTH INSURANCE | Attending: Clinical | Primary: Clinical

## 2019-10-17 ENCOUNTER — Telehealth: Admit: 2019-10-17 | Discharge: 2019-10-18 | Payer: PRIVATE HEALTH INSURANCE | Attending: Clinical | Primary: Clinical

## 2019-10-17 ENCOUNTER — Ambulatory Visit
Admit: 2019-10-17 | Discharge: 2019-10-18 | Payer: PRIVATE HEALTH INSURANCE | Attending: Internal Medicine | Primary: Internal Medicine

## 2019-10-17 DIAGNOSIS — N644 Mastodynia: Principal | ICD-10-CM

## 2019-11-15 ENCOUNTER — Ambulatory Visit: Admit: 2019-11-15 | Discharge: 2019-11-16 | Payer: PRIVATE HEALTH INSURANCE

## 2019-11-15 DIAGNOSIS — R928 Other abnormal and inconclusive findings on diagnostic imaging of breast: Principal | ICD-10-CM

## 2019-11-22 ENCOUNTER — Ambulatory Visit: Admit: 2019-11-22 | Discharge: 2019-11-23 | Payer: PRIVATE HEALTH INSURANCE

## 2019-11-22 DIAGNOSIS — C50911 Malignant neoplasm of unspecified site of right female breast: Principal | ICD-10-CM

## 2019-11-22 DIAGNOSIS — Z170 Estrogen receptor positive status [ER+]: Principal | ICD-10-CM

## 2019-11-24 MED ORDER — ONDANSETRON 4 MG DISINTEGRATING TABLET
ORAL_TABLET | Freq: Three times a day (TID) | ORAL | 0 refills | 7 days | Status: CP | PRN
Start: 2019-11-24 — End: 2019-12-01

## 2019-11-25 ENCOUNTER — Ambulatory Visit
Admit: 2019-11-25 | Discharge: 2019-11-25 | Payer: PRIVATE HEALTH INSURANCE | Attending: Student in an Organized Health Care Education/Training Program | Primary: Student in an Organized Health Care Education/Training Program

## 2019-11-25 DIAGNOSIS — F419 Anxiety disorder, unspecified: Principal | ICD-10-CM

## 2019-11-25 DIAGNOSIS — C50911 Malignant neoplasm of unspecified site of right female breast: Principal | ICD-10-CM

## 2019-11-25 MED ORDER — ALPRAZOLAM 0.25 MG TABLET
ORAL_TABLET | Freq: Three times a day (TID) | ORAL | 0 refills | 4.00000 days | Status: CP | PRN
Start: 2019-11-25 — End: ?

## 2019-11-28 ENCOUNTER — Ambulatory Visit: Admit: 2019-11-28 | Discharge: 2019-11-28 | Payer: PRIVATE HEALTH INSURANCE

## 2019-11-28 ENCOUNTER — Ambulatory Visit: Admit: 2019-11-28 | Discharge: 2019-11-28 | Payer: PRIVATE HEALTH INSURANCE | Attending: Clinical | Primary: Clinical

## 2019-11-30 ENCOUNTER — Ambulatory Visit: Admit: 2019-11-30 | Discharge: 2019-12-01 | Payer: PRIVATE HEALTH INSURANCE

## 2019-11-30 MED ORDER — LIDOCAINE-PRILOCAINE 2.5 %-2.5 % TOPICAL CREAM
Freq: Once | TOPICAL | 0 refills | 0.00000 days | Status: CP | PRN
Start: 2019-11-30 — End: ?

## 2019-12-01 DIAGNOSIS — Z170 Estrogen receptor positive status [ER+]: Principal | ICD-10-CM

## 2019-12-01 DIAGNOSIS — C50911 Malignant neoplasm of unspecified site of right female breast: Principal | ICD-10-CM

## 2019-12-02 ENCOUNTER — Institutional Professional Consult (permissible substitution): Admit: 2019-12-02 | Discharge: 2019-12-02 | Payer: PRIVATE HEALTH INSURANCE

## 2019-12-02 DIAGNOSIS — Z170 Estrogen receptor positive status [ER+]: Principal | ICD-10-CM

## 2019-12-02 DIAGNOSIS — C50911 Malignant neoplasm of unspecified site of right female breast: Principal | ICD-10-CM

## 2019-12-05 DIAGNOSIS — C50911 Malignant neoplasm of unspecified site of right female breast: Principal | ICD-10-CM

## 2019-12-05 DIAGNOSIS — Z170 Estrogen receptor positive status [ER+]: Principal | ICD-10-CM

## 2019-12-06 ENCOUNTER — Ambulatory Visit: Admit: 2019-12-06 | Discharge: 2020-01-04 | Payer: PRIVATE HEALTH INSURANCE

## 2019-12-06 ENCOUNTER — Ambulatory Visit: Admit: 2019-12-06 | Discharge: 2019-12-07 | Payer: PRIVATE HEALTH INSURANCE

## 2019-12-06 DIAGNOSIS — C50911 Malignant neoplasm of unspecified site of right female breast: Principal | ICD-10-CM

## 2019-12-06 DIAGNOSIS — Z170 Estrogen receptor positive status [ER+]: Principal | ICD-10-CM

## 2019-12-08 ENCOUNTER — Ambulatory Visit: Admit: 2019-12-08 | Discharge: 2019-12-08 | Payer: PRIVATE HEALTH INSURANCE

## 2019-12-08 DIAGNOSIS — C50911 Malignant neoplasm of unspecified site of right female breast: Principal | ICD-10-CM

## 2019-12-08 DIAGNOSIS — Z170 Estrogen receptor positive status [ER+]: Secondary | ICD-10-CM

## 2019-12-08 MED ORDER — PROCHLORPERAZINE MALEATE 10 MG TABLET
ORAL_TABLET | Freq: Four times a day (QID) | ORAL | 2 refills | 8 days | Status: CP | PRN
Start: 2019-12-08 — End: ?

## 2019-12-08 MED ORDER — ONDANSETRON HCL 4 MG TABLET
ORAL_TABLET | Freq: Three times a day (TID) | ORAL | 2 refills | 5 days | Status: CP | PRN
Start: 2019-12-08 — End: ?

## 2019-12-08 MED ORDER — LORAZEPAM 0.5 MG TABLET
ORAL_TABLET | Freq: Every evening | ORAL | 0 refills | 12 days | Status: CP | PRN
Start: 2019-12-08 — End: ?

## 2019-12-08 MED ORDER — OLANZAPINE 5 MG TABLET
ORAL_TABLET | Freq: Every evening | ORAL | 0 refills | 16 days | Status: CP
Start: 2019-12-08 — End: 2020-12-07

## 2019-12-08 MED ORDER — DEXAMETHASONE 4 MG TABLET
ORAL_TABLET | Freq: Every day | ORAL | 0 refills | 12 days | Status: CP
Start: 2019-12-08 — End: ?

## 2019-12-09 ENCOUNTER — Ambulatory Visit: Admit: 2019-12-09 | Discharge: 2019-12-10 | Payer: PRIVATE HEALTH INSURANCE | Attending: Family | Primary: Family

## 2019-12-09 ENCOUNTER — Ambulatory Visit: Admit: 2019-12-09 | Discharge: 2019-12-31 | Payer: PRIVATE HEALTH INSURANCE

## 2019-12-09 ENCOUNTER — Ambulatory Visit
Admit: 2019-12-09 | Discharge: 2019-12-31 | Payer: PRIVATE HEALTH INSURANCE | Attending: Radiation Oncology | Primary: Radiation Oncology

## 2019-12-09 DIAGNOSIS — C50911 Malignant neoplasm of unspecified site of right female breast: Principal | ICD-10-CM

## 2019-12-09 DIAGNOSIS — Z170 Estrogen receptor positive status [ER+]: Principal | ICD-10-CM

## 2019-12-11 ENCOUNTER — Ambulatory Visit: Admit: 2019-12-11 | Discharge: 2019-12-12 | Payer: PRIVATE HEALTH INSURANCE

## 2019-12-12 DIAGNOSIS — C50911 Malignant neoplasm of unspecified site of right female breast: Principal | ICD-10-CM

## 2019-12-12 DIAGNOSIS — Z170 Estrogen receptor positive status [ER+]: Principal | ICD-10-CM

## 2019-12-12 MED ORDER — PEGFILGRASTIM-BMEZ 6 MG/0.6 ML SUBCUTANEOUS SYRINGE
Freq: Once | SUBCUTANEOUS | 2 refills | 1 days | Status: CP
Start: 2019-12-12 — End: 2019-12-12

## 2019-12-14 DIAGNOSIS — Z170 Estrogen receptor positive status [ER+]: Principal | ICD-10-CM

## 2019-12-14 DIAGNOSIS — C50911 Malignant neoplasm of unspecified site of right female breast: Principal | ICD-10-CM

## 2019-12-14 MED ORDER — ONDANSETRON HCL 8 MG TABLET
ORAL_TABLET | Freq: Three times a day (TID) | ORAL | 2 refills | 10.00000 days | Status: CP | PRN
Start: 2019-12-14 — End: ?

## 2019-12-15 DIAGNOSIS — C50911 Malignant neoplasm of unspecified site of right female breast: Principal | ICD-10-CM

## 2019-12-15 DIAGNOSIS — Z170 Estrogen receptor positive status [ER+]: Principal | ICD-10-CM

## 2019-12-15 MED ORDER — PEGFILGRASTIM-BMEZ 6 MG/0.6 ML SUBCUTANEOUS SYRINGE
Freq: Once | SUBCUTANEOUS | 2 refills | 1 days | Status: CP
Start: 2019-12-15 — End: 2019-12-15

## 2019-12-16 DIAGNOSIS — C50911 Malignant neoplasm of unspecified site of right female breast: Principal | ICD-10-CM

## 2019-12-16 DIAGNOSIS — Z170 Estrogen receptor positive status [ER+]: Principal | ICD-10-CM

## 2019-12-19 ENCOUNTER — Ambulatory Visit: Admit: 2019-12-19 | Discharge: 2019-12-20 | Payer: PRIVATE HEALTH INSURANCE

## 2019-12-21 ENCOUNTER — Ambulatory Visit: Admit: 2019-12-21 | Discharge: 2019-12-22 | Payer: PRIVATE HEALTH INSURANCE

## 2019-12-23 ENCOUNTER — Ambulatory Visit: Admit: 2019-12-23 | Discharge: 2019-12-24 | Payer: PRIVATE HEALTH INSURANCE

## 2019-12-23 ENCOUNTER — Other Ambulatory Visit: Admit: 2019-12-23 | Discharge: 2019-12-24 | Payer: PRIVATE HEALTH INSURANCE

## 2019-12-23 DIAGNOSIS — Z170 Estrogen receptor positive status [ER+]: Secondary | ICD-10-CM

## 2019-12-23 DIAGNOSIS — C50911 Malignant neoplasm of unspecified site of right female breast: Principal | ICD-10-CM

## 2019-12-25 ENCOUNTER — Ambulatory Visit: Admit: 2019-12-25 | Discharge: 2019-12-26 | Payer: PRIVATE HEALTH INSURANCE

## 2019-12-30 ENCOUNTER — Institutional Professional Consult (permissible substitution): Admit: 2019-12-30 | Discharge: 2019-12-31 | Payer: PRIVATE HEALTH INSURANCE

## 2019-12-30 DIAGNOSIS — C50911 Malignant neoplasm of unspecified site of right female breast: Principal | ICD-10-CM

## 2019-12-30 DIAGNOSIS — Z170 Estrogen receptor positive status [ER+]: Secondary | ICD-10-CM

## 2020-01-05 ENCOUNTER — Ambulatory Visit: Admit: 2020-01-05 | Discharge: 2020-01-06 | Payer: PRIVATE HEALTH INSURANCE

## 2020-01-05 ENCOUNTER — Other Ambulatory Visit: Admit: 2020-01-05 | Discharge: 2020-01-06 | Payer: PRIVATE HEALTH INSURANCE

## 2020-01-05 DIAGNOSIS — Z170 Estrogen receptor positive status [ER+]: Principal | ICD-10-CM

## 2020-01-05 DIAGNOSIS — C50911 Malignant neoplasm of unspecified site of right female breast: Principal | ICD-10-CM

## 2020-01-07 ENCOUNTER — Ambulatory Visit: Admit: 2020-01-07 | Discharge: 2020-01-08 | Payer: PRIVATE HEALTH INSURANCE

## 2020-01-09 DIAGNOSIS — C50911 Malignant neoplasm of unspecified site of right female breast: Principal | ICD-10-CM

## 2020-01-09 DIAGNOSIS — Z170 Estrogen receptor positive status [ER+]: Principal | ICD-10-CM

## 2020-01-10 ENCOUNTER — Ambulatory Visit
Admit: 2020-01-10 | Discharge: 2020-01-10 | Disposition: A | Discharge status: Home / Self Care | Payer: PRIVATE HEALTH INSURANCE

## 2020-01-10 DIAGNOSIS — R55 Syncope and collapse: Principal | ICD-10-CM

## 2020-01-13 ENCOUNTER — Ambulatory Visit: Admit: 2020-01-13 | Discharge: 2020-01-14 | Payer: PRIVATE HEALTH INSURANCE

## 2020-01-15 DIAGNOSIS — C50911 Malignant neoplasm of unspecified site of right female breast: Principal | ICD-10-CM

## 2020-01-15 DIAGNOSIS — Z170 Estrogen receptor positive status [ER+]: Principal | ICD-10-CM

## 2020-01-17 ENCOUNTER — Telehealth: Admit: 2020-01-17 | Discharge: 2020-01-18 | Payer: PRIVATE HEALTH INSURANCE | Attending: MS" | Primary: MS"

## 2020-01-18 ENCOUNTER — Ambulatory Visit: Admit: 2020-01-18 | Discharge: 2020-01-19 | Payer: PRIVATE HEALTH INSURANCE

## 2020-01-18 ENCOUNTER — Other Ambulatory Visit: Admit: 2020-01-18 | Discharge: 2020-01-19 | Payer: PRIVATE HEALTH INSURANCE

## 2020-01-18 ENCOUNTER — Ambulatory Visit: Admit: 2020-01-18 | Discharge: 2020-01-19 | Payer: PRIVATE HEALTH INSURANCE | Attending: Family | Primary: Family

## 2020-01-18 DIAGNOSIS — C50911 Malignant neoplasm of unspecified site of right female breast: Principal | ICD-10-CM

## 2020-01-18 DIAGNOSIS — Z170 Estrogen receptor positive status [ER+]: Principal | ICD-10-CM

## 2020-01-20 ENCOUNTER — Ambulatory Visit: Admit: 2020-01-20 | Discharge: 2020-01-21 | Payer: PRIVATE HEALTH INSURANCE

## 2020-01-20 MED ORDER — OLANZAPINE 5 MG TABLET
ORAL_TABLET | Freq: Every evening | ORAL | 0 refills | 16 days | Status: CP
Start: 2020-01-20 — End: 2021-01-19

## 2020-01-23 ENCOUNTER — Emergency Department
Admit: 2020-01-23 | Discharge: 2020-01-23 | Disposition: A | Discharge status: Home / Self Care | Payer: PRIVATE HEALTH INSURANCE | Attending: Emergency Medicine

## 2020-01-23 ENCOUNTER — Ambulatory Visit
Admit: 2020-01-23 | Discharge: 2020-01-23 | Disposition: A | Discharge status: Home / Self Care | Payer: PRIVATE HEALTH INSURANCE | Attending: Emergency Medicine

## 2020-01-23 DIAGNOSIS — R55 Syncope and collapse: Principal | ICD-10-CM

## 2020-01-25 ENCOUNTER — Institutional Professional Consult (permissible substitution): Admit: 2020-01-25 | Discharge: 2020-01-25 | Payer: PRIVATE HEALTH INSURANCE

## 2020-01-25 ENCOUNTER — Ambulatory Visit
Admit: 2020-01-25 | Discharge: 2020-01-25 | Payer: PRIVATE HEALTH INSURANCE | Attending: Adult Health | Primary: Adult Health

## 2020-01-25 DIAGNOSIS — C50911 Malignant neoplasm of unspecified site of right female breast: Principal | ICD-10-CM

## 2020-01-25 DIAGNOSIS — Z170 Estrogen receptor positive status [ER+]: Principal | ICD-10-CM

## 2020-01-25 DIAGNOSIS — R55 Syncope and collapse: Principal | ICD-10-CM

## 2020-01-26 ENCOUNTER — Ambulatory Visit: Admit: 2020-01-26 | Discharge: 2020-01-27 | Payer: PRIVATE HEALTH INSURANCE

## 2020-02-01 ENCOUNTER — Ambulatory Visit: Admit: 2020-02-01 | Discharge: 2020-02-01 | Payer: PRIVATE HEALTH INSURANCE

## 2020-02-01 ENCOUNTER — Other Ambulatory Visit: Admit: 2020-02-01 | Discharge: 2020-02-01 | Payer: PRIVATE HEALTH INSURANCE

## 2020-02-01 DIAGNOSIS — Z170 Estrogen receptor positive status [ER+]: Secondary | ICD-10-CM

## 2020-02-01 DIAGNOSIS — C50911 Malignant neoplasm of unspecified site of right female breast: Principal | ICD-10-CM

## 2020-02-01 MED ORDER — ONDANSETRON 8 MG DISINTEGRATING TABLET
ORAL_TABLET | Freq: Three times a day (TID) | ORAL | 1 refills | 10.00000 days | Status: CP | PRN
Start: 2020-02-01 — End: 2020-07-30

## 2020-02-03 ENCOUNTER — Ambulatory Visit: Admit: 2020-02-03 | Discharge: 2020-02-04 | Payer: PRIVATE HEALTH INSURANCE

## 2020-02-04 MED ORDER — GABAPENTIN 100 MG CAPSULE
ORAL_CAPSULE | Freq: Three times a day (TID) | ORAL | 0 refills | 15.00000 days | Status: CP | PRN
Start: 2020-02-04 — End: 2020-02-14

## 2020-02-08 DIAGNOSIS — Z170 Estrogen receptor positive status [ER+]: Principal | ICD-10-CM

## 2020-02-08 DIAGNOSIS — C50911 Malignant neoplasm of unspecified site of right female breast: Principal | ICD-10-CM

## 2020-02-08 MED ORDER — BACLOFEN 5 MG TABLET
ORAL_TABLET | Freq: Three times a day (TID) | ORAL | 0 refills | 10.00000 days | Status: CP | PRN
Start: 2020-02-08 — End: ?

## 2020-02-09 ENCOUNTER — Other Ambulatory Visit: Admit: 2020-02-09 | Discharge: 2020-02-10 | Payer: PRIVATE HEALTH INSURANCE

## 2020-02-09 DIAGNOSIS — C50911 Malignant neoplasm of unspecified site of right female breast: Principal | ICD-10-CM

## 2020-02-09 DIAGNOSIS — Z170 Estrogen receptor positive status [ER+]: Principal | ICD-10-CM

## 2020-02-13 ENCOUNTER — Ambulatory Visit
Admit: 2020-02-13 | Discharge: 2020-02-14 | Payer: PRIVATE HEALTH INSURANCE | Attending: Internal Medicine | Primary: Internal Medicine

## 2020-02-13 ENCOUNTER — Ambulatory Visit
Admit: 2020-02-13 | Discharge: 2020-02-14 | Payer: PRIVATE HEALTH INSURANCE | Attending: Obstetrics & Gynecology | Primary: Obstetrics & Gynecology

## 2020-02-13 DIAGNOSIS — N766 Ulceration of vulva: Principal | ICD-10-CM

## 2020-02-13 MED ORDER — ZINC OXIDE 40 % TOPICAL OINTMENT
Freq: Three times a day (TID) | TOPICAL | 0 refills | 0.00000 days | Status: CP
Start: 2020-02-13 — End: ?

## 2020-02-13 MED ORDER — CLOTRIMAZOLE 1 % TOPICAL OINTMENT
Freq: Two times a day (BID) | TOPICAL | 0 refills | 0 days | Status: CP
Start: 2020-02-13 — End: ?

## 2020-02-14 DIAGNOSIS — C50911 Malignant neoplasm of unspecified site of right female breast: Principal | ICD-10-CM

## 2020-02-14 DIAGNOSIS — Z170 Estrogen receptor positive status [ER+]: Principal | ICD-10-CM

## 2020-02-15 ENCOUNTER — Ambulatory Visit: Admit: 2020-02-15 | Discharge: 2020-02-16 | Payer: PRIVATE HEALTH INSURANCE

## 2020-02-15 ENCOUNTER — Other Ambulatory Visit: Admit: 2020-02-15 | Discharge: 2020-02-16 | Payer: PRIVATE HEALTH INSURANCE

## 2020-02-15 ENCOUNTER — Ambulatory Visit: Admit: 2020-02-15 | Discharge: 2020-02-16 | Payer: PRIVATE HEALTH INSURANCE | Attending: Family | Primary: Family

## 2020-02-15 DIAGNOSIS — C50911 Malignant neoplasm of unspecified site of right female breast: Principal | ICD-10-CM

## 2020-02-15 DIAGNOSIS — Z170 Estrogen receptor positive status [ER+]: Secondary | ICD-10-CM

## 2020-02-22 ENCOUNTER — Institutional Professional Consult (permissible substitution): Admit: 2020-02-22 | Discharge: 2020-02-22 | Payer: PRIVATE HEALTH INSURANCE

## 2020-02-22 ENCOUNTER — Other Ambulatory Visit: Admit: 2020-02-22 | Discharge: 2020-02-22 | Payer: PRIVATE HEALTH INSURANCE

## 2020-02-22 DIAGNOSIS — C50911 Malignant neoplasm of unspecified site of right female breast: Principal | ICD-10-CM

## 2020-02-22 DIAGNOSIS — Z170 Estrogen receptor positive status [ER+]: Secondary | ICD-10-CM

## 2020-02-23 ENCOUNTER — Ambulatory Visit: Admit: 2020-02-23 | Discharge: 2020-02-23 | Payer: PRIVATE HEALTH INSURANCE

## 2020-02-23 DIAGNOSIS — Z170 Estrogen receptor positive status [ER+]: Principal | ICD-10-CM

## 2020-02-23 DIAGNOSIS — C50911 Malignant neoplasm of unspecified site of right female breast: Principal | ICD-10-CM

## 2020-02-25 ENCOUNTER — Ambulatory Visit: Admit: 2020-02-25 | Discharge: 2020-02-26 | Payer: PRIVATE HEALTH INSURANCE

## 2020-02-28 ENCOUNTER — Ambulatory Visit
Admit: 2020-02-28 | Discharge: 2020-02-29 | Payer: PRIVATE HEALTH INSURANCE | Attending: Cardiovascular Disease | Primary: Cardiovascular Disease

## 2020-02-28 DIAGNOSIS — Z170 Estrogen receptor positive status [ER+]: Secondary | ICD-10-CM

## 2020-02-28 DIAGNOSIS — C50911 Malignant neoplasm of unspecified site of right female breast: Principal | ICD-10-CM

## 2020-03-02 ENCOUNTER — Ambulatory Visit
Admit: 2020-03-02 | Discharge: 2020-03-03 | Payer: PRIVATE HEALTH INSURANCE | Attending: Physician Assistant | Primary: Physician Assistant

## 2020-03-07 ENCOUNTER — Other Ambulatory Visit: Admit: 2020-03-07 | Discharge: 2020-03-08 | Payer: PRIVATE HEALTH INSURANCE

## 2020-03-07 ENCOUNTER — Ambulatory Visit: Admit: 2020-03-07 | Discharge: 2020-03-08 | Payer: PRIVATE HEALTH INSURANCE

## 2020-03-07 DIAGNOSIS — Z170 Estrogen receptor positive status [ER+]: Principal | ICD-10-CM

## 2020-03-07 DIAGNOSIS — C50911 Malignant neoplasm of unspecified site of right female breast: Principal | ICD-10-CM

## 2020-03-09 ENCOUNTER — Ambulatory Visit: Admit: 2020-03-09 | Discharge: 2020-03-10 | Payer: PRIVATE HEALTH INSURANCE

## 2020-03-13 ENCOUNTER — Ambulatory Visit: Admit: 2020-03-13 | Discharge: 2020-03-14 | Payer: PRIVATE HEALTH INSURANCE

## 2020-03-21 ENCOUNTER — Ambulatory Visit: Admit: 2020-03-21 | Discharge: 2020-03-22 | Payer: PRIVATE HEALTH INSURANCE

## 2020-03-21 ENCOUNTER — Other Ambulatory Visit: Admit: 2020-03-21 | Discharge: 2020-03-22 | Payer: PRIVATE HEALTH INSURANCE

## 2020-03-21 DIAGNOSIS — C50911 Malignant neoplasm of unspecified site of right female breast: Principal | ICD-10-CM

## 2020-03-21 DIAGNOSIS — Z170 Estrogen receptor positive status [ER+]: Secondary | ICD-10-CM

## 2020-03-22 ENCOUNTER — Ambulatory Visit: Admit: 2020-03-22 | Discharge: 2020-03-23 | Payer: PRIVATE HEALTH INSURANCE

## 2020-03-22 ENCOUNTER — Ambulatory Visit
Admit: 2020-03-22 | Discharge: 2020-03-23 | Payer: PRIVATE HEALTH INSURANCE | Attending: Student in an Organized Health Care Education/Training Program | Primary: Student in an Organized Health Care Education/Training Program

## 2020-03-22 DIAGNOSIS — C50911 Malignant neoplasm of unspecified site of right female breast: Principal | ICD-10-CM

## 2020-03-22 DIAGNOSIS — Z170 Estrogen receptor positive status [ER+]: Secondary | ICD-10-CM

## 2020-03-23 ENCOUNTER — Ambulatory Visit: Admit: 2020-03-23 | Discharge: 2020-03-24 | Payer: PRIVATE HEALTH INSURANCE

## 2020-03-23 MED ORDER — GABAPENTIN 100 MG CAPSULE
ORAL_CAPSULE | Freq: Three times a day (TID) | ORAL | 0 refills | 15.00000 days | Status: CP | PRN
Start: 2020-03-23 — End: 2020-04-02

## 2020-03-23 MED ORDER — BACLOFEN 5 MG TABLET
ORAL_TABLET | Freq: Three times a day (TID) | ORAL | 0 refills | 10 days | Status: CP | PRN
Start: 2020-03-23 — End: ?

## 2020-04-02 ENCOUNTER — Ambulatory Visit: Admit: 2020-04-02 | Discharge: 2020-04-03 | Payer: PRIVATE HEALTH INSURANCE

## 2020-04-02 DIAGNOSIS — Z7189 Other specified counseling: Principal | ICD-10-CM

## 2020-04-04 DIAGNOSIS — Z170 Estrogen receptor positive status [ER+]: Principal | ICD-10-CM

## 2020-04-04 DIAGNOSIS — C50911 Malignant neoplasm of unspecified site of right female breast: Principal | ICD-10-CM

## 2020-04-05 DIAGNOSIS — Z6835 Body mass index (BMI) 35.0-35.9, adult: Principal | ICD-10-CM

## 2020-04-05 DIAGNOSIS — Z9221 Personal history of antineoplastic chemotherapy: Principal | ICD-10-CM

## 2020-04-05 DIAGNOSIS — Z170 Estrogen receptor positive status [ER+]: Principal | ICD-10-CM

## 2020-04-05 DIAGNOSIS — K219 Gastro-esophageal reflux disease without esophagitis: Principal | ICD-10-CM

## 2020-04-05 DIAGNOSIS — C773 Secondary and unspecified malignant neoplasm of axilla and upper limb lymph nodes: Principal | ICD-10-CM

## 2020-04-05 DIAGNOSIS — C50911 Malignant neoplasm of unspecified site of right female breast: Principal | ICD-10-CM

## 2020-04-05 DIAGNOSIS — E669 Obesity, unspecified: Principal | ICD-10-CM

## 2020-04-05 DIAGNOSIS — G43909 Migraine, unspecified, not intractable, without status migrainosus: Principal | ICD-10-CM

## 2020-04-16 ENCOUNTER — Ambulatory Visit: Admit: 2020-04-16 | Discharge: 2020-04-17 | Payer: PRIVATE HEALTH INSURANCE

## 2020-04-16 DIAGNOSIS — S80861A Insect bite (nonvenomous), right lower leg, initial encounter: Principal | ICD-10-CM

## 2020-04-16 DIAGNOSIS — W57XXXA Bitten or stung by nonvenomous insect and other nonvenomous arthropods, initial encounter: Principal | ICD-10-CM

## 2020-04-16 MED ORDER — CLOBETASOL 0.05 % TOPICAL OINTMENT
Freq: Two times a day (BID) | TOPICAL | 0 refills | 0 days | Status: CP
Start: 2020-04-16 — End: 2021-04-16

## 2020-04-18 ENCOUNTER — Ambulatory Visit
Admit: 2020-04-18 | Discharge: 2020-04-19 | Payer: PRIVATE HEALTH INSURANCE | Attending: Physician Assistant | Primary: Physician Assistant

## 2020-04-18 DIAGNOSIS — Z01812 Encounter for preprocedural laboratory examination: Principal | ICD-10-CM

## 2020-04-18 DIAGNOSIS — Z20822 Encounter for preprocedure screening laboratory testing for COVID-19: Principal | ICD-10-CM

## 2020-04-19 ENCOUNTER — Ambulatory Visit: Admit: 2020-04-19 | Discharge: 2020-04-20 | Payer: PRIVATE HEALTH INSURANCE

## 2020-04-19 DIAGNOSIS — Z170 Estrogen receptor positive status [ER+]: Principal | ICD-10-CM

## 2020-04-19 DIAGNOSIS — C773 Secondary and unspecified malignant neoplasm of axilla and upper limb lymph nodes: Principal | ICD-10-CM

## 2020-04-19 DIAGNOSIS — C50911 Malignant neoplasm of unspecified site of right female breast: Principal | ICD-10-CM

## 2020-04-19 DIAGNOSIS — Z6835 Body mass index (BMI) 35.0-35.9, adult: Principal | ICD-10-CM

## 2020-04-19 DIAGNOSIS — Z9221 Personal history of antineoplastic chemotherapy: Principal | ICD-10-CM

## 2020-04-19 DIAGNOSIS — K219 Gastro-esophageal reflux disease without esophagitis: Principal | ICD-10-CM

## 2020-04-19 DIAGNOSIS — E669 Obesity, unspecified: Principal | ICD-10-CM

## 2020-04-19 DIAGNOSIS — G43909 Migraine, unspecified, not intractable, without status migrainosus: Principal | ICD-10-CM

## 2020-04-20 ENCOUNTER — Ambulatory Visit: Admit: 2020-04-20 | Discharge: 2020-04-21 | Payer: PRIVATE HEALTH INSURANCE

## 2020-04-20 ENCOUNTER — Encounter
Admit: 2020-04-20 | Discharge: 2020-04-21 | Payer: PRIVATE HEALTH INSURANCE | Attending: Anesthesiology | Primary: Anesthesiology

## 2020-04-20 DIAGNOSIS — Z170 Estrogen receptor positive status [ER+]: Principal | ICD-10-CM

## 2020-04-20 DIAGNOSIS — K219 Gastro-esophageal reflux disease without esophagitis: Principal | ICD-10-CM

## 2020-04-20 DIAGNOSIS — G43909 Migraine, unspecified, not intractable, without status migrainosus: Principal | ICD-10-CM

## 2020-04-20 DIAGNOSIS — Z9221 Personal history of antineoplastic chemotherapy: Principal | ICD-10-CM

## 2020-04-20 DIAGNOSIS — C773 Secondary and unspecified malignant neoplasm of axilla and upper limb lymph nodes: Principal | ICD-10-CM

## 2020-04-20 DIAGNOSIS — Z6835 Body mass index (BMI) 35.0-35.9, adult: Principal | ICD-10-CM

## 2020-04-20 DIAGNOSIS — E669 Obesity, unspecified: Principal | ICD-10-CM

## 2020-04-20 DIAGNOSIS — C50911 Malignant neoplasm of unspecified site of right female breast: Principal | ICD-10-CM

## 2020-04-21 DIAGNOSIS — C50911 Malignant neoplasm of unspecified site of right female breast: Principal | ICD-10-CM

## 2020-04-21 DIAGNOSIS — Z170 Estrogen receptor positive status [ER+]: Principal | ICD-10-CM

## 2020-04-21 MED ORDER — OXYCODONE 5 MG TABLET
ORAL_TABLET | ORAL | 0 refills | 3.00000 days | Status: CP | PRN
Start: 2020-04-21 — End: 2020-04-26
  Filled 2020-04-21: qty 15, 3d supply, fill #0

## 2020-04-21 MED ORDER — CEPHALEXIN 500 MG CAPSULE
ORAL_CAPSULE | Freq: Four times a day (QID) | ORAL | 0 refills | 7.00 days | Status: CP
Start: 2020-04-21 — End: 2020-04-30
  Filled 2020-04-21: qty 28, 7d supply, fill #0

## 2020-04-21 MED FILL — OXYCODONE 5 MG TABLET: 3 days supply | Qty: 15 | Fill #0 | Status: AC

## 2020-04-21 MED FILL — CEPHALEXIN 500 MG CAPSULE: 7 days supply | Qty: 28 | Fill #0 | Status: AC

## 2020-04-23 MED ORDER — OXYCODONE 5 MG TABLET
ORAL_TABLET | ORAL | 0 refills | 3 days | Status: CP | PRN
Start: 2020-04-23 — End: 2020-04-30

## 2020-04-25 ENCOUNTER — Ambulatory Visit: Admit: 2020-04-25 | Discharge: 2020-04-25 | Payer: PRIVATE HEALTH INSURANCE

## 2020-04-25 ENCOUNTER — Institutional Professional Consult (permissible substitution): Admit: 2020-04-25 | Discharge: 2020-04-25 | Payer: PRIVATE HEALTH INSURANCE

## 2020-04-25 ENCOUNTER — Encounter: Admit: 2020-04-25 | Discharge: 2020-04-25 | Payer: PRIVATE HEALTH INSURANCE

## 2020-04-25 DIAGNOSIS — Z170 Estrogen receptor positive status [ER+]: Principal | ICD-10-CM

## 2020-04-25 DIAGNOSIS — C50911 Malignant neoplasm of unspecified site of right female breast: Principal | ICD-10-CM

## 2020-04-25 DIAGNOSIS — I10 Essential (primary) hypertension: Principal | ICD-10-CM

## 2020-04-25 MED ORDER — GABAPENTIN 100 MG CAPSULE
ORAL_CAPSULE | Freq: Three times a day (TID) | ORAL | 1 refills | 15.00000 days | Status: CP | PRN
Start: 2020-04-25 — End: 2020-05-05

## 2020-04-30 ENCOUNTER — Ambulatory Visit: Admit: 2020-04-30 | Discharge: 2020-05-01 | Payer: PRIVATE HEALTH INSURANCE

## 2020-04-30 DIAGNOSIS — Z79899 Other long term (current) drug therapy: Principal | ICD-10-CM

## 2020-04-30 DIAGNOSIS — Z87442 Personal history of urinary calculi: Principal | ICD-10-CM

## 2020-04-30 DIAGNOSIS — Z86718 Personal history of other venous thrombosis and embolism: Principal | ICD-10-CM

## 2020-04-30 DIAGNOSIS — E669 Obesity, unspecified: Principal | ICD-10-CM

## 2020-04-30 DIAGNOSIS — C50911 Malignant neoplasm of unspecified site of right female breast: Principal | ICD-10-CM

## 2020-04-30 DIAGNOSIS — C50511 Malignant neoplasm of lower-outer quadrant of right female breast: Principal | ICD-10-CM

## 2020-04-30 DIAGNOSIS — I10 Essential (primary) hypertension: Principal | ICD-10-CM

## 2020-04-30 DIAGNOSIS — C773 Secondary and unspecified malignant neoplasm of axilla and upper limb lymph nodes: Principal | ICD-10-CM

## 2020-04-30 DIAGNOSIS — K219 Gastro-esophageal reflux disease without esophagitis: Principal | ICD-10-CM

## 2020-04-30 DIAGNOSIS — Z9889 Other specified postprocedural states: Principal | ICD-10-CM

## 2020-04-30 DIAGNOSIS — Z9013 Acquired absence of bilateral breasts and nipples: Principal | ICD-10-CM

## 2020-04-30 DIAGNOSIS — G932 Benign intracranial hypertension: Principal | ICD-10-CM

## 2020-04-30 DIAGNOSIS — Z9221 Personal history of antineoplastic chemotherapy: Principal | ICD-10-CM

## 2020-04-30 DIAGNOSIS — Z170 Estrogen receptor positive status [ER+]: Principal | ICD-10-CM

## 2020-05-02 ENCOUNTER — Ambulatory Visit
Admit: 2020-05-02 | Discharge: 2020-06-01 | Payer: PRIVATE HEALTH INSURANCE | Attending: Radiation Oncology | Primary: Radiation Oncology

## 2020-05-02 ENCOUNTER — Ambulatory Visit: Admit: 2020-05-02 | Discharge: 2020-06-01 | Payer: PRIVATE HEALTH INSURANCE

## 2020-05-02 DIAGNOSIS — Z170 Estrogen receptor positive status [ER+]: Principal | ICD-10-CM

## 2020-05-02 DIAGNOSIS — Z9011 Acquired absence of right breast and nipple: Principal | ICD-10-CM

## 2020-05-02 DIAGNOSIS — C50511 Malignant neoplasm of lower-outer quadrant of right female breast: Principal | ICD-10-CM

## 2020-05-02 DIAGNOSIS — C773 Secondary and unspecified malignant neoplasm of axilla and upper limb lymph nodes: Principal | ICD-10-CM

## 2020-05-02 DIAGNOSIS — C50911 Malignant neoplasm of unspecified site of right female breast: Principal | ICD-10-CM

## 2020-05-03 ENCOUNTER — Institutional Professional Consult (permissible substitution)
Admit: 2020-05-03 | Discharge: 2020-05-04 | Payer: PRIVATE HEALTH INSURANCE | Attending: Student in an Organized Health Care Education/Training Program | Primary: Student in an Organized Health Care Education/Training Program

## 2020-05-03 DIAGNOSIS — C50911 Malignant neoplasm of unspecified site of right female breast: Principal | ICD-10-CM

## 2020-05-03 DIAGNOSIS — Z170 Estrogen receptor positive status [ER+]: Principal | ICD-10-CM

## 2020-05-04 ENCOUNTER — Ambulatory Visit
Admit: 2020-05-04 | Discharge: 2020-05-05 | Payer: PRIVATE HEALTH INSURANCE | Attending: Physician Assistant | Primary: Physician Assistant

## 2020-05-04 DIAGNOSIS — Z9221 Personal history of antineoplastic chemotherapy: Principal | ICD-10-CM

## 2020-05-04 DIAGNOSIS — C50511 Malignant neoplasm of lower-outer quadrant of right female breast: Principal | ICD-10-CM

## 2020-05-04 DIAGNOSIS — Z79899 Other long term (current) drug therapy: Principal | ICD-10-CM

## 2020-05-04 DIAGNOSIS — Z86718 Personal history of other venous thrombosis and embolism: Principal | ICD-10-CM

## 2020-05-04 DIAGNOSIS — Z170 Estrogen receptor positive status [ER+]: Principal | ICD-10-CM

## 2020-05-04 DIAGNOSIS — K219 Gastro-esophageal reflux disease without esophagitis: Principal | ICD-10-CM

## 2020-05-04 DIAGNOSIS — C773 Secondary and unspecified malignant neoplasm of axilla and upper limb lymph nodes: Principal | ICD-10-CM

## 2020-05-04 DIAGNOSIS — Z9013 Acquired absence of bilateral breasts and nipples: Principal | ICD-10-CM

## 2020-05-04 DIAGNOSIS — Z87442 Personal history of urinary calculi: Principal | ICD-10-CM

## 2020-05-04 DIAGNOSIS — G932 Benign intracranial hypertension: Principal | ICD-10-CM

## 2020-05-04 DIAGNOSIS — Z20822 Encounter for preprocedure screening laboratory testing for COVID-19: Principal | ICD-10-CM

## 2020-05-04 DIAGNOSIS — Z01812 Encounter for preprocedural laboratory examination: Principal | ICD-10-CM

## 2020-05-04 DIAGNOSIS — I10 Essential (primary) hypertension: Principal | ICD-10-CM

## 2020-05-04 DIAGNOSIS — E669 Obesity, unspecified: Principal | ICD-10-CM

## 2020-05-07 ENCOUNTER — Ambulatory Visit: Admit: 2020-05-07 | Discharge: 2020-05-08 | Payer: PRIVATE HEALTH INSURANCE

## 2020-05-07 ENCOUNTER — Encounter
Admit: 2020-05-07 | Discharge: 2020-05-08 | Payer: PRIVATE HEALTH INSURANCE | Attending: Certified Registered" | Primary: Certified Registered"

## 2020-05-07 DIAGNOSIS — K219 Gastro-esophageal reflux disease without esophagitis: Principal | ICD-10-CM

## 2020-05-07 DIAGNOSIS — Z86718 Personal history of other venous thrombosis and embolism: Principal | ICD-10-CM

## 2020-05-07 DIAGNOSIS — Z170 Estrogen receptor positive status [ER+]: Principal | ICD-10-CM

## 2020-05-07 DIAGNOSIS — Z79899 Other long term (current) drug therapy: Principal | ICD-10-CM

## 2020-05-07 DIAGNOSIS — G932 Benign intracranial hypertension: Principal | ICD-10-CM

## 2020-05-07 DIAGNOSIS — Z9221 Personal history of antineoplastic chemotherapy: Principal | ICD-10-CM

## 2020-05-07 DIAGNOSIS — Z9013 Acquired absence of bilateral breasts and nipples: Principal | ICD-10-CM

## 2020-05-07 DIAGNOSIS — E669 Obesity, unspecified: Principal | ICD-10-CM

## 2020-05-07 DIAGNOSIS — I10 Essential (primary) hypertension: Principal | ICD-10-CM

## 2020-05-07 DIAGNOSIS — C50511 Malignant neoplasm of lower-outer quadrant of right female breast: Principal | ICD-10-CM

## 2020-05-07 DIAGNOSIS — Z87442 Personal history of urinary calculi: Principal | ICD-10-CM

## 2020-05-07 DIAGNOSIS — C773 Secondary and unspecified malignant neoplasm of axilla and upper limb lymph nodes: Principal | ICD-10-CM

## 2020-05-07 MED ORDER — SULFAMETHOXAZOLE 800 MG-TRIMETHOPRIM 160 MG TABLET
ORAL_TABLET | Freq: Two times a day (BID) | ORAL | 0 refills | 7.00000 days | Status: CP
Start: 2020-05-07 — End: 2020-05-17

## 2020-05-07 MED ORDER — CYCLOBENZAPRINE 5 MG TABLET
ORAL_TABLET | Freq: Three times a day (TID) | ORAL | 0 refills | 10.00000 days | Status: CP | PRN
Start: 2020-05-07 — End: ?

## 2020-05-07 MED ORDER — DICLOFENAC SODIUM 75 MG TABLET,DELAYED RELEASE
ORAL_TABLET | Freq: Two times a day (BID) | ORAL | 0 refills | 15.00000 days | Status: CP
Start: 2020-05-07 — End: 2020-05-22

## 2020-05-07 MED ORDER — OXYCODONE 5 MG TABLET
ORAL_TABLET | ORAL | 0 refills | 2.00000 days | Status: CP | PRN
Start: 2020-05-07 — End: 2020-05-17

## 2020-05-11 DIAGNOSIS — C50911 Malignant neoplasm of unspecified site of right female breast: Principal | ICD-10-CM

## 2020-05-11 DIAGNOSIS — Z170 Estrogen receptor positive status [ER+]: Principal | ICD-10-CM

## 2020-05-17 ENCOUNTER — Ambulatory Visit
Admit: 2020-05-17 | Discharge: 2020-05-17 | Payer: PRIVATE HEALTH INSURANCE | Attending: Student in an Organized Health Care Education/Training Program | Primary: Student in an Organized Health Care Education/Training Program

## 2020-05-17 ENCOUNTER — Institutional Professional Consult (permissible substitution): Admit: 2020-05-17 | Discharge: 2020-05-17 | Payer: PRIVATE HEALTH INSURANCE

## 2020-05-17 DIAGNOSIS — Z170 Estrogen receptor positive status [ER+]: Principal | ICD-10-CM

## 2020-05-17 DIAGNOSIS — C773 Secondary and unspecified malignant neoplasm of axilla and upper limb lymph nodes: Principal | ICD-10-CM

## 2020-05-17 DIAGNOSIS — C50911 Malignant neoplasm of unspecified site of right female breast: Principal | ICD-10-CM

## 2020-05-23 ENCOUNTER — Ambulatory Visit: Admit: 2020-05-23 | Discharge: 2020-05-23 | Payer: PRIVATE HEALTH INSURANCE

## 2020-05-23 ENCOUNTER — Institutional Professional Consult (permissible substitution): Admit: 2020-05-23 | Discharge: 2020-05-23 | Payer: PRIVATE HEALTH INSURANCE

## 2020-05-23 DIAGNOSIS — C50911 Malignant neoplasm of unspecified site of right female breast: Principal | ICD-10-CM

## 2020-05-23 DIAGNOSIS — Z170 Estrogen receptor positive status [ER+]: Principal | ICD-10-CM

## 2020-05-23 DIAGNOSIS — Z9889 Other specified postprocedural states: Principal | ICD-10-CM

## 2020-05-30 DIAGNOSIS — C50911 Malignant neoplasm of unspecified site of right female breast: Principal | ICD-10-CM

## 2020-05-30 DIAGNOSIS — Z9011 Acquired absence of right breast and nipple: Principal | ICD-10-CM

## 2020-05-30 DIAGNOSIS — C50511 Malignant neoplasm of lower-outer quadrant of right female breast: Principal | ICD-10-CM

## 2020-05-30 DIAGNOSIS — C773 Secondary and unspecified malignant neoplasm of axilla and upper limb lymph nodes: Principal | ICD-10-CM

## 2020-05-30 DIAGNOSIS — Z170 Estrogen receptor positive status [ER+]: Principal | ICD-10-CM

## 2020-05-30 MED ORDER — MOMETASONE 0.1 % TOPICAL CREAM
1 refills | 0 days | Status: CP
Start: 2020-05-30 — End: 2021-05-30

## 2020-05-31 ENCOUNTER — Institutional Professional Consult (permissible substitution): Admit: 2020-05-31 | Discharge: 2020-06-01 | Payer: PRIVATE HEALTH INSURANCE

## 2020-06-04 ENCOUNTER — Ambulatory Visit
Admit: 2020-06-04 | Discharge: 2020-07-02 | Payer: PRIVATE HEALTH INSURANCE | Attending: Radiation Oncology | Primary: Radiation Oncology

## 2020-06-04 ENCOUNTER — Ambulatory Visit: Admit: 2020-06-04 | Discharge: 2020-07-02 | Payer: PRIVATE HEALTH INSURANCE

## 2020-06-04 DIAGNOSIS — C773 Secondary and unspecified malignant neoplasm of axilla and upper limb lymph nodes: Principal | ICD-10-CM

## 2020-06-04 DIAGNOSIS — Z170 Estrogen receptor positive status [ER+]: Principal | ICD-10-CM

## 2020-06-04 DIAGNOSIS — Z9011 Acquired absence of right breast and nipple: Principal | ICD-10-CM

## 2020-06-04 DIAGNOSIS — C50511 Malignant neoplasm of lower-outer quadrant of right female breast: Principal | ICD-10-CM

## 2020-06-04 DIAGNOSIS — C50911 Malignant neoplasm of unspecified site of right female breast: Principal | ICD-10-CM

## 2020-06-07 ENCOUNTER — Institutional Professional Consult (permissible substitution): Admit: 2020-06-07 | Discharge: 2020-06-08 | Payer: PRIVATE HEALTH INSURANCE

## 2020-06-11 ENCOUNTER — Ambulatory Visit: Admit: 2020-06-11 | Discharge: 2020-06-12 | Payer: PRIVATE HEALTH INSURANCE

## 2020-06-11 DIAGNOSIS — C50911 Malignant neoplasm of unspecified site of right female breast: Principal | ICD-10-CM

## 2020-06-11 DIAGNOSIS — Z170 Estrogen receptor positive status [ER+]: Principal | ICD-10-CM

## 2020-06-13 ENCOUNTER — Ambulatory Visit
Admit: 2020-06-13 | Discharge: 2020-07-12 | Payer: PRIVATE HEALTH INSURANCE | Attending: Rehabilitative and Restorative Service Providers" | Primary: Rehabilitative and Restorative Service Providers"

## 2020-06-13 DIAGNOSIS — I972 Postmastectomy lymphedema syndrome: Principal | ICD-10-CM

## 2020-06-13 DIAGNOSIS — Z170 Estrogen receptor positive status [ER+]: Principal | ICD-10-CM

## 2020-06-13 DIAGNOSIS — L905 Scar conditions and fibrosis of skin: Principal | ICD-10-CM

## 2020-06-13 DIAGNOSIS — C50911 Malignant neoplasm of unspecified site of right female breast: Principal | ICD-10-CM

## 2020-06-14 ENCOUNTER — Institutional Professional Consult (permissible substitution): Admit: 2020-06-14 | Discharge: 2020-06-15 | Payer: PRIVATE HEALTH INSURANCE

## 2020-06-14 DIAGNOSIS — C773 Secondary and unspecified malignant neoplasm of axilla and upper limb lymph nodes: Principal | ICD-10-CM

## 2020-06-14 DIAGNOSIS — Z9011 Acquired absence of right breast and nipple: Principal | ICD-10-CM

## 2020-06-14 DIAGNOSIS — Z170 Estrogen receptor positive status [ER+]: Principal | ICD-10-CM

## 2020-06-14 DIAGNOSIS — C50911 Malignant neoplasm of unspecified site of right female breast: Principal | ICD-10-CM

## 2020-06-14 DIAGNOSIS — C50511 Malignant neoplasm of lower-outer quadrant of right female breast: Principal | ICD-10-CM

## 2020-06-14 NOTE — Unmapped (Signed)
Pt presented to clinic needing urine POCT HCG test. POCT urine HCG test- negative. Ready for SIM.

## 2020-06-16 DIAGNOSIS — Z170 Estrogen receptor positive status [ER+]: Principal | ICD-10-CM

## 2020-06-16 DIAGNOSIS — C50911 Malignant neoplasm of unspecified site of right female breast: Principal | ICD-10-CM

## 2020-06-19 ENCOUNTER — Ambulatory Visit
Admit: 2020-06-19 | Discharge: 2020-06-20 | Payer: PRIVATE HEALTH INSURANCE | Attending: Physician Assistant | Primary: Physician Assistant

## 2020-06-19 DIAGNOSIS — C50911 Malignant neoplasm of unspecified site of right female breast: Principal | ICD-10-CM

## 2020-06-19 DIAGNOSIS — I972 Postmastectomy lymphedema syndrome: Principal | ICD-10-CM

## 2020-06-19 DIAGNOSIS — L905 Scar conditions and fibrosis of skin: Principal | ICD-10-CM

## 2020-06-19 DIAGNOSIS — Z170 Estrogen receptor positive status [ER+]: Principal | ICD-10-CM

## 2020-06-19 DIAGNOSIS — Z20822 Contact with and (suspected) exposure to covid-19: Principal | ICD-10-CM

## 2020-06-19 DIAGNOSIS — Z20828 Contact with and (suspected) exposure to other viral communicable diseases: Principal | ICD-10-CM

## 2020-06-20 ENCOUNTER — Institutional Professional Consult (permissible substitution): Admit: 2020-06-20 | Discharge: 2020-06-21 | Payer: PRIVATE HEALTH INSURANCE

## 2020-06-20 DIAGNOSIS — Z170 Estrogen receptor positive status [ER+]: Principal | ICD-10-CM

## 2020-06-20 DIAGNOSIS — Z5111 Encounter for antineoplastic chemotherapy: Principal | ICD-10-CM

## 2020-06-20 DIAGNOSIS — C50911 Malignant neoplasm of unspecified site of right female breast: Principal | ICD-10-CM

## 2020-06-21 ENCOUNTER — Ambulatory Visit: Admit: 2020-06-21 | Discharge: 2020-06-22 | Payer: PRIVATE HEALTH INSURANCE

## 2020-06-21 DIAGNOSIS — Z9011 Acquired absence of right breast and nipple: Principal | ICD-10-CM

## 2020-06-21 DIAGNOSIS — C50511 Malignant neoplasm of lower-outer quadrant of right female breast: Principal | ICD-10-CM

## 2020-06-21 DIAGNOSIS — C773 Secondary and unspecified malignant neoplasm of axilla and upper limb lymph nodes: Principal | ICD-10-CM

## 2020-06-21 DIAGNOSIS — Z9889 Other specified postprocedural states: Principal | ICD-10-CM

## 2020-06-21 DIAGNOSIS — Z170 Estrogen receptor positive status [ER+]: Principal | ICD-10-CM

## 2020-06-21 DIAGNOSIS — C50911 Malignant neoplasm of unspecified site of right female breast: Principal | ICD-10-CM

## 2020-06-28 DIAGNOSIS — L905 Scar conditions and fibrosis of skin: Principal | ICD-10-CM

## 2020-06-28 DIAGNOSIS — Z170 Estrogen receptor positive status [ER+]: Principal | ICD-10-CM

## 2020-06-28 DIAGNOSIS — I972 Postmastectomy lymphedema syndrome: Principal | ICD-10-CM

## 2020-06-28 DIAGNOSIS — C50911 Malignant neoplasm of unspecified site of right female breast: Principal | ICD-10-CM

## 2020-06-29 DIAGNOSIS — Z170 Estrogen receptor positive status [ER+]: Principal | ICD-10-CM

## 2020-06-29 DIAGNOSIS — Z9011 Acquired absence of right breast and nipple: Principal | ICD-10-CM

## 2020-06-29 DIAGNOSIS — C773 Secondary and unspecified malignant neoplasm of axilla and upper limb lymph nodes: Principal | ICD-10-CM

## 2020-06-29 DIAGNOSIS — C50511 Malignant neoplasm of lower-outer quadrant of right female breast: Principal | ICD-10-CM

## 2020-06-29 DIAGNOSIS — C50911 Malignant neoplasm of unspecified site of right female breast: Principal | ICD-10-CM

## 2020-07-02 ENCOUNTER — Ambulatory Visit: Admit: 2020-07-02 | Discharge: 2020-07-03

## 2020-07-03 ENCOUNTER — Ambulatory Visit: Admit: 2020-07-03 | Discharge: 2020-07-04

## 2020-07-03 ENCOUNTER — Ambulatory Visit: Admit: 2020-07-03 | Discharge: 2020-07-30 | Payer: PRIVATE HEALTH INSURANCE

## 2020-07-03 ENCOUNTER — Ambulatory Visit
Admit: 2020-07-03 | Discharge: 2020-07-30 | Payer: PRIVATE HEALTH INSURANCE | Attending: Radiation Oncology | Primary: Radiation Oncology

## 2020-07-03 DIAGNOSIS — I972 Postmastectomy lymphedema syndrome: Principal | ICD-10-CM

## 2020-07-03 DIAGNOSIS — Z170 Estrogen receptor positive status [ER+]: Principal | ICD-10-CM

## 2020-07-03 DIAGNOSIS — C50911 Malignant neoplasm of unspecified site of right female breast: Principal | ICD-10-CM

## 2020-07-03 DIAGNOSIS — L905 Scar conditions and fibrosis of skin: Principal | ICD-10-CM

## 2020-07-04 ENCOUNTER — Ambulatory Visit: Admit: 2020-07-04 | Discharge: 2020-07-05

## 2020-07-04 DIAGNOSIS — C50911 Malignant neoplasm of unspecified site of right female breast: Principal | ICD-10-CM

## 2020-07-04 DIAGNOSIS — Z170 Estrogen receptor positive status [ER+]: Principal | ICD-10-CM

## 2020-07-05 ENCOUNTER — Ambulatory Visit: Admit: 2020-07-05 | Discharge: 2020-07-06

## 2020-07-06 ENCOUNTER — Ambulatory Visit: Admit: 2020-07-06 | Discharge: 2020-07-07

## 2020-07-06 DIAGNOSIS — C50911 Malignant neoplasm of unspecified site of right female breast: Principal | ICD-10-CM

## 2020-07-06 DIAGNOSIS — Z170 Estrogen receptor positive status [ER+]: Principal | ICD-10-CM

## 2020-07-09 ENCOUNTER — Ambulatory Visit: Admit: 2020-07-09 | Discharge: 2020-07-10

## 2020-07-10 ENCOUNTER — Ambulatory Visit: Admit: 2020-07-10 | Discharge: 2020-07-11

## 2020-07-10 DIAGNOSIS — C50911 Malignant neoplasm of unspecified site of right female breast: Principal | ICD-10-CM

## 2020-07-10 DIAGNOSIS — I972 Postmastectomy lymphedema syndrome: Principal | ICD-10-CM

## 2020-07-10 DIAGNOSIS — L905 Scar conditions and fibrosis of skin: Principal | ICD-10-CM

## 2020-07-10 DIAGNOSIS — Z170 Estrogen receptor positive status [ER+]: Principal | ICD-10-CM

## 2020-07-11 ENCOUNTER — Ambulatory Visit: Admit: 2020-07-11 | Discharge: 2020-07-12

## 2020-07-12 ENCOUNTER — Ambulatory Visit: Admit: 2020-07-12 | Discharge: 2020-07-13

## 2020-07-13 ENCOUNTER — Ambulatory Visit: Admit: 2020-07-13 | Discharge: 2020-07-14

## 2020-07-13 DIAGNOSIS — Z170 Estrogen receptor positive status [ER+]: Principal | ICD-10-CM

## 2020-07-13 DIAGNOSIS — C50911 Malignant neoplasm of unspecified site of right female breast: Principal | ICD-10-CM

## 2020-07-16 ENCOUNTER — Ambulatory Visit: Admit: 2020-07-16 | Discharge: 2020-07-17

## 2020-07-17 ENCOUNTER — Ambulatory Visit: Admit: 2020-07-17 | Discharge: 2020-07-18

## 2020-07-18 ENCOUNTER — Ambulatory Visit: Admit: 2020-07-18 | Discharge: 2020-07-19

## 2020-07-18 ENCOUNTER — Ambulatory Visit
Admit: 2020-07-18 | Discharge: 2020-08-11 | Payer: PRIVATE HEALTH INSURANCE | Attending: Rehabilitative and Restorative Service Providers" | Primary: Rehabilitative and Restorative Service Providers"

## 2020-07-18 ENCOUNTER — Institutional Professional Consult (permissible substitution): Admit: 2020-07-18 | Discharge: 2020-07-19 | Payer: PRIVATE HEALTH INSURANCE

## 2020-07-18 DIAGNOSIS — Z170 Estrogen receptor positive status [ER+]: Principal | ICD-10-CM

## 2020-07-18 DIAGNOSIS — C50911 Malignant neoplasm of unspecified site of right female breast: Principal | ICD-10-CM

## 2020-07-19 ENCOUNTER — Ambulatory Visit
Admit: 2020-07-19 | Discharge: 2020-07-20 | Payer: PRIVATE HEALTH INSURANCE | Attending: Physician Assistant | Primary: Physician Assistant

## 2020-07-20 ENCOUNTER — Ambulatory Visit: Admit: 2020-07-20 | Discharge: 2020-07-21

## 2020-07-23 ENCOUNTER — Ambulatory Visit: Admit: 2020-07-23 | Discharge: 2020-07-24

## 2020-07-24 ENCOUNTER — Ambulatory Visit: Admit: 2020-07-24 | Discharge: 2020-07-25

## 2020-07-25 ENCOUNTER — Ambulatory Visit: Admit: 2020-07-25 | Discharge: 2020-07-26

## 2020-07-26 ENCOUNTER — Ambulatory Visit: Admit: 2020-07-26 | Discharge: 2020-07-27

## 2020-07-27 ENCOUNTER — Ambulatory Visit: Admit: 2020-07-27 | Discharge: 2020-07-28

## 2020-07-30 ENCOUNTER — Ambulatory Visit: Admit: 2020-07-30 | Discharge: 2020-07-31

## 2020-07-31 ENCOUNTER — Ambulatory Visit: Admit: 2020-07-31 | Discharge: 2020-08-01

## 2020-07-31 ENCOUNTER — Ambulatory Visit
Admit: 2020-07-31 | Discharge: 2020-08-30 | Payer: PRIVATE HEALTH INSURANCE | Attending: Radiation Oncology | Primary: Radiation Oncology

## 2020-07-31 ENCOUNTER — Ambulatory Visit: Admit: 2020-07-31 | Discharge: 2020-08-30 | Payer: PRIVATE HEALTH INSURANCE

## 2020-08-01 ENCOUNTER — Ambulatory Visit: Admit: 2020-08-01 | Discharge: 2020-08-02

## 2020-08-01 ENCOUNTER — Telehealth: Admit: 2020-08-01 | Discharge: 2020-08-02 | Payer: PRIVATE HEALTH INSURANCE | Attending: Clinical | Primary: Clinical

## 2020-08-01 DIAGNOSIS — Z170 Estrogen receptor positive status [ER+]: Principal | ICD-10-CM

## 2020-08-01 DIAGNOSIS — C50911 Malignant neoplasm of unspecified site of right female breast: Principal | ICD-10-CM

## 2020-08-02 ENCOUNTER — Ambulatory Visit: Admit: 2020-08-02 | Discharge: 2020-08-03

## 2020-08-03 ENCOUNTER — Ambulatory Visit: Admit: 2020-08-03 | Discharge: 2020-08-04

## 2020-08-06 ENCOUNTER — Ambulatory Visit: Admit: 2020-08-06 | Discharge: 2020-08-07

## 2020-08-08 ENCOUNTER — Institutional Professional Consult (permissible substitution): Admit: 2020-08-08 | Discharge: 2020-08-08 | Payer: PRIVATE HEALTH INSURANCE

## 2020-08-08 ENCOUNTER — Ambulatory Visit: Admit: 2020-08-08 | Discharge: 2020-08-08 | Payer: PRIVATE HEALTH INSURANCE

## 2020-08-08 DIAGNOSIS — C50911 Malignant neoplasm of unspecified site of right female breast: Principal | ICD-10-CM

## 2020-08-08 DIAGNOSIS — Z170 Estrogen receptor positive status [ER+]: Principal | ICD-10-CM

## 2020-08-08 MED ORDER — OMEPRAZOLE 20 MG CAPSULE,DELAYED RELEASE
ORAL_CAPSULE | Freq: Every day | ORAL | 3 refills | 90.00000 days | Status: CP
Start: 2020-08-08 — End: 2021-08-08

## 2020-08-08 MED ORDER — LETROZOLE 2.5 MG TABLET
ORAL_TABLET | Freq: Every day | ORAL | 3 refills | 90 days | Status: CP
Start: 2020-08-08 — End: ?

## 2020-08-08 MED ORDER — ABEMACICLIB 150 MG TABLET
ORAL_TABLET | Freq: Two times a day (BID) | ORAL | 5 refills | 28 days | Status: CP
Start: 2020-08-08 — End: ?
  Filled 2020-08-15: qty 56, 28d supply, fill #0

## 2020-08-08 NOTE — Unmapped (Signed)
Pt received lupron 3.75mg  injection in Left ventroGluteus. Tolerated it well. Applied guaze and band aid.

## 2020-08-10 NOTE — Unmapped (Signed)
CC: Post op     HPI:  Tami Gibson is a 30 y.o. 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 Teola Bradley) and immediate reconstruction with tissue expander placement on 04/20/20 who presents today for post op check. Patients final pathology revealed residual disease with 2/3 positive lymph nodes and she underwent ALND with Dr. Teola Bradley on 12/6.     She underwent adjuvant radiation (completed 08/06/20). She has been started on letrozole per Dr. Daneil Dolin. She has been doing well after radiation though she has been experiencing hot flashes on the letrozole. She has developed redness over the superior aspect of her R breast as well as in her R axilla at the site of her ALND that she first noticed on 3/4. Patient still wishes to undergo bilateral DIEP flaps for second stage of reconstruction. She denies fever or chills.     Past Medical History:  Past Medical History:   Diagnosis Date   ??? Acne     started during teen yrs., Accutane 2008-2009 during college yrs   ??? Anemia     During teen years only, no anemia recently   ??? 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 Gestational hypertension 01/30/2017    end of pregnancy had hypertension, was induced   ??? Headache     migraine occasionally   ??? Hypertension     during week 37 week, pt induced and had C/S due to hypertension   ??? 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 Kidney stone    ??? Malignant neoplasm of right breast in female, 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/30/2019   ??? Pyelonephritis 09/2016    Kidney stones   ??? Urinary tract infection     Had 5-6 in Lifetime, esp high school and college age   ??? Varicella     during childhood   ??? Vitamin B 12 deficiency        Past Surgical History:  Past Surgical History:   Procedure Laterality Date   ??? BREAST BIOPSY Right     IDC   ??? CESAREAN SECTION  01/30/2017    breech   ??? CHEMOTHERAPY ??? IR INSERT PORT AGE GREATER THAN 5 YRS  12/08/2019    IR INSERT PORT AGE GREATER THAN 5 YRS 12/08/2019 Jobe Gibbon, MD IMG VIR H&V [facility]   ??? LUMBAR PUNCTURE DIAGNOSTIC [facility] HISTORICAL RESULT)  01/2015    to relieve ICP   ??? PR BX/REMV,LYMPH NODE,DEEP AXILL Right 04/20/2020    Procedure: BX/EXC LYMPH NODE; OPEN, DEEP AXILRY NODE;  Surgeon: Moss Mc, MD;  Location: MAIN OR [facility];  Service: Surgical Oncology Breast   ??? PR CESAREAN DELIVERY ONLY N/A 01/30/2017    Procedure: CESAREAN DELIVERY ONLY;  Surgeon: Asher Muir, MD;  Location: L&D C-SECTION OR SUITES [facility];  Service: Maternal-Fetal Medicine   ??? PR CESAREAN DELIVERY ONLY N/A 02/17/2019    Procedure: CESAREAN DELIVERY ONLY;  Surgeon: Lonny Prude, MD;  Location: L&D C-SECTION OR SUITES [facility];  Service: Maternal-Fetal Medicine   ??? PR IMPLNT BIO IMPLNT FOR SOFT TISSUE REINFORCEMENT Bilateral 04/20/2020    Procedure: IMPLANTATION BIOLOGIC IMPLANT(EG, ACELLULAR DERMAL MATRIX) FOR SOFT TISSUE REINFORCEMENT(EG, BREAST, TRUNK);  Surgeon: Arsenio Katz, MD;  Location: MAIN OR [facility];  Service: Plastics   ??? PR INTRAOPERATIVE SENTINEL LYMPH NODE ID W DYE INJECTION Right 04/20/2020  Procedure: INTRAOPERATIVE IDENTIFICATION SENTINEL LYMPH NODE(S) INCLUDE INJECTION NON-RADIOACTIVE DYE, WHEN PERFORMED;  Surgeon: Moss Mc, MD;  Location: MAIN OR [place];  Service: Surgical Oncology Breast   ??? PR INTRAOPERATIVE SENTINEL LYMPH NODE ID W DYE INJECTION Right 05/07/2020    Procedure: INTRAOPERATIVE IDENTIFICATION SENTINEL LYMPH NODE(S) INCLUDE INJECTION NON-RADIOACTIVE DYE, WHEN PERFORMED;  Surgeon: Moss Mc, MD;  Location: MAIN OR [place];  Service: Surgical Oncology Breast   ??? PR MASTECTOMY, SIMPLE, COMPLETE Bilateral 04/20/2020    Procedure: MASTECTOMY, SIMPLE, COMPLETE;  Surgeon: Moss Mc, MD;  Location: MAIN OR [place];  Service: Surgical Oncology Breast   ??? PR REMOVE ARMPITS LYMPH NODES COMPLT Right 05/07/2020    Procedure: AXILLARY LYMPHADENECTOMY; COMPLETE;  Surgeon: Moss Mc, MD;  Location: MAIN OR [place];  Service: Surgical Oncology Breast   ??? PR TISSUE EXPANDER PLACEMENT BREAST RECONSTRUCTION Bilateral 04/20/2020    Procedure: TISSUE EXPANDER PLACEMENT IN BREAST RECONSTRUCTION, INCLUDING SUBSEQUENT EXPANSION(S);  Surgeon: Arsenio Katz, MD;  Location: MAIN OR [facility];  Service: Plastics   ??? TONSILECTOMY, ADENOIDECTOMY, BILATERAL MYRINGOTOMY AND TUBES     ??? WISDOM TOOTH EXTRACTION  2004       Medications:  Current Outpatient Medications   Medication Sig Dispense Refill   ??? ergocalciferol-1,250 mcg, 50,000 unit, (VITAMIN D2-1,250 MCG, 50,000 UNIT,) 1,250 mcg (50,000 unit) capsule Take 1,250 mcg by mouth once a week.     ??? vitamin E-134 mg, 200 UNIT, 134 mg (200 UNIT) capsule Take 134 mg by mouth daily.     ??? abemaciclib (VERZENIO) 150 mg Tab tablet Take 1 tablet (150 mg total) by mouth Two (2) times a day. Take with or without food. 56 tablet 5   ??? acetaminophen (TYLENOL) 325 MG tablet Take 650 mg by mouth every six (6) hours as needed for pain.     ??? calcium carbonate (TUMS) 200 mg calcium (500 mg) chewable tablet Chew 1 tablet daily.     ??? clobetasoL (TEMOVATE) 0.05 % ointment Apply topically Two (2) times a day. 30 g 0   ??? clotrimazole 1 % Oint Apply 1 application topically two (2) times a day. 60 g 0   ??? cyclobenzaprine (FLEXERIL) 5 MG tablet Take 1 tablet (5 mg total) by mouth Three (3) times a day as needed for muscle spasms. 30 tablet 0   ??? docusate sodium (COLACE) 100 MG capsule Take 100 mg by mouth. (Patient not taking: Reported on 08/08/2020)     ??? escitalopram oxalate (LEXAPRO) 10 MG tablet Take 1 tablet (10 mg total) by mouth daily. 30 tablet 11   ??? famotidine (PEPCID) 20 MG tablet Take 20 mg by mouth. (Patient not taking: Reported on 08/08/2020)     ??? letrozole (FEMARA) 2.5 mg tablet Take 1 tablet (2.5 mg total) by mouth daily. 90 tablet 3   ??? loratadine (CLARITIN) 10 mg tablet Take 10 mg by mouth daily. (Patient not taking: Reported on 08/08/2020)     ??? mometasone (ELOCON) 0.1 % cream Apply to breast twice daily 90 g 1   ??? OLANZapine (ZYPREXA) 5 MG tablet Take 1 tablet (5 mg total) by mouth nightly. On days 1, 2, 3, and 4 of chemotherapy. (Patient not taking: Reported on 08/08/2020) 16 tablet 0   ??? omeprazole (PRILOSEC) 20 MG capsule Take 1 capsule (20 mg total) by mouth daily. 90 capsule 3   ??? rimegepant (NURTEC ODT) 75 mg TbDL Take 75 mg by mouth.      ??? SUMAtriptan (IMITREX) 100 MG  tablet TAKE ONE TABLET BY MOUTH AT ONSET OF MIGRAINE FOR UP TO 1 DOSE . IF SYMPTOMS PERSIST A SECOND DOSE MAY BE TAKEN IN 2 HOURS IF NEEDED     ??? topiramate (TOPAMAX) 25 MG tablet Take 25 mg by mouth daily. (Patient not taking: Reported on 08/08/2020)       No current facility-administered medications for this visit.     Facility-Administered Medications Ordered in Other Visits   Medication Dose Route Frequency Provider Last Rate Last Admin   ??? leuprolide (LUPRON) 3.75 mg injection                Allergies:  No Known Allergies    Family History:   Family History   Problem Relation Age of Onset   ??? Cancer Maternal Aunt 45        breast Ca   ??? Hypertension Father    ??? Other Father 54        Benign brain tumor   ??? Hypothyroidism Mother    ??? Cancer Paternal Aunt         Breast Ca dx in 49s   ??? ALS Maternal Aunt    ??? Ovarian cancer Maternal Grandmother 30   ??? No Known Problems Maternal Grandfather    ??? Cancer Paternal Grandmother         Unknown Cancer   ??? No Known Problems Paternal Grandfather    ??? No Known Problems Maternal Aunt    ??? No Known Problems Maternal Aunt    ??? Breast cancer Maternal Cousin         Dx in 81s   ??? No Known Problems Paternal Aunt    ??? No Known Problems Daughter    ??? No Known Problems Daughter    ??? No Known Problems Other    ??? BRCA 1/2 Neg Hx    ??? Colon cancer Neg Hx    ??? Endometrial cancer Neg Hx     The patient reports no family history for bleeding disorders or anesthetic problems.    Social History:  Social History     Socioeconomic History   ??? Marital status: Married     Spouse name: Dylan   ??? Number of children: 0   ??? Years of education: Not on file   ??? Highest education level: Not on file   Occupational History   ??? Occupation: Recreational therapist    Tobacco Use   ??? Smoking status: Never Smoker   ??? Smokeless tobacco: Never Used   Vaping Use   ??? Vaping Use: Never used   Substance and Sexual Activity   ??? Alcohol use: Not Currently   ??? Drug use: No   ??? Sexual activity: Yes     Partners: Male     Birth control/protection: Pill   Other Topics Concern   ??? Exercise Yes   ??? Living Situation No   Social History Narrative    PRECONCEPTUAL ASSESSMENT:        Tami Gibson is a 30 y.o. Caucasian female    Caffeine:denies use    Cats: no    Exercise: not active    Diet: balanced    Family hx of of birth defects, chromosomal abnormality, intellectual disability, developmental delay: no.            PARTNER HISTORY:     Tami Gibson is a 58 y.o.  Caucasian female.    Occupation:  Immunologist; [employer]    He has fathered children:  no  He is healthy with no chronic illness    Family history of  birth defects, chromosomal abnormality, intellectual disability, or developmental delay: no.              Social Determinants of Health     Financial Resource Strain: Low Risk    ??? Difficulty of Paying Living Expenses: Not very hard   Food Insecurity: No Food Insecurity   ??? Worried About Running Out of Food in the Last Year: Never true   ??? Ran Out of Food in the Last Year: Never true   Transportation Needs: No Transportation Needs   ??? Lack of Transportation (Medical): No   ??? Lack of Transportation (Non-Medical): No   Physical Activity: Not on file   Stress: Not on file   Social Connections: Not on file       ROS:   Otherwise, 12 point review of systems was completed and is negative except as per HPI.      Vitals:   Vitals:    08/13/20 0751   BP: 134/98   Pulse: 115   Resp: 18   Temp: 35.7 ??C (96.3 ??F)   SpO2: 96% Gen: AA in NAD  HEENT: NCAT, EOMI, PERRL, Non icteric sclerae, MMM  CV: RRR  RESP: quiet respirations on room air  EXT: warm and well perfused  BREASTS: Bilateral incisions are well approximated with TE in place (right fully expanded and left flat). Right breast with some mild erythema of the superior aspect and into the axilla from radiation. No open wounds, no skin slough. No drainage.     Procedure:  Breast expander port was located with magnet and marked. Breast was prepped in standard sterile fashion with chloroprep. Needle was inserted into port and breast with hard stop of the port noted.  She was expanded with 150 cc sterile saline on the left. Patient tolerated well.       Impression and Plan:  Tami Gibson is here today for post op check after B SSM with right TAD (Dr. Teola Bradley) and immediate reconstruction with tissue expander placement on 04/20/20. Doing well after finishing radiation.     - Left TE + 150 ccs. Left TE total 300/600.  TE on the right no change, 600/600 ccs   - Will wait 9 months - 1 year after radiation before proceeding with DIEP flaps   - Follow up in 1 week for nurse visit for expansion and in 4 weeks with me     Return in about 4 weeks (around 09/10/2020) for 4 weeks with Tami Gibson and 1 week for expansion nurse visit .     ______________________________________________________________________    Documentation assistance was provided by Marga Melnick, Scribe, on August 13, 2020 at 8:31 AM for Tami Dike C. Lafayette Dragon, MD    The documentation recorded by the scribe accurately reflects the service I personally performed and the decisions made by me Janet Berlin.

## 2020-08-10 NOTE — Unmapped (Signed)
[facility] Specialty Medication Onboarding    Specialty Medication: Verzenio 150mg  tablets  Prior Authorization: Approved   Financial Assistance: No - copay  <$25  Final Copay/Day Supply: $0 / 28 days    Insurance Restrictions: None     Notes to Pharmacist:     The triage team has completed the benefits investigation and has determined that the patient is able to fill this medication at [facility]. Please contact the patient to complete the onboarding or follow up with the prescribing physician as needed.

## 2020-08-13 ENCOUNTER — Ambulatory Visit: Admit: 2020-08-13 | Discharge: 2020-08-14 | Payer: PRIVATE HEALTH INSURANCE

## 2020-08-13 DIAGNOSIS — Z9889 Other specified postprocedural states: Principal | ICD-10-CM

## 2020-08-13 NOTE — Unmapped (Signed)
[facility]   Patient Onboarding/Medication Counseling    Tami Gibson is a 30 y.o. female with breast cancer who I am counseling today on initiation of therapy.  I am speaking to the patient.    Was a Nurse, learning disability used for this call? No    Verified patient's date of birth / HIPAA.    Specialty medication(s) to be sent: Hematology/Oncology: Verzenio 150mg       Non-specialty medications/supplies to be sent: n/a      Medications not needed at this time: n/a         Verzenio (abemaciclib)    Medication & Administration     Dosage: 150mg  (1 tablets ) by mouth twice daily (roughly 12 hours apart) Comes in a blister card.    Administration: This is an oral medication and can be taken with or without food.  Swallow the tablet whole and do not break, crush, or chew the tablet.  It may or may not be given in combination with other medications (example any aromatase inhibitor or fulvestrant).    Adherence/Missed dose instructions: If you miss a dose, skip that dose and continue to take your medication as scheduled. If you vomit after taking your dose, do not take another dose and take next dose at its normally scheduled time.    Goals of Therapy     Prevent disease progression    Side Effects & Monitoring Parameters   ??? Nausea  ??? Vomiting  ??? Diarrhea  ??? Fatigue  ??? Infection precautions (neutropenia)  ??? Anemia   ??? Decreased appetite and changes in taste  ??? Headache  ??? Bleeding precautions (thrombocytopenia)  ??? Liver toxicity  ??? Pulmonary toxicity  (new cough, trouble breathing, shortness of breath)    The following side effects should be reported to the provider:  ??? Heartbeat that doesn't feel normal (heart feels like it's racing, skipping a beat or fluttering)  ??? Signs of infection (fever, chills, cough)  ??? Excessive bruising, gums bleeding or nose bleeds  ??? Decrease in urination, change in color of urine, blood in urine or swelling in ankles  ??? Yellowing of skin or eyes  ??? New cough, trouble breathing, shortness of breath or chest pain  ??? Swelling, redness or pain in an extremity or shortness of breath (blood clots)        Contraindications, Warnings, & Precautions     ??? Abemaciclib can cause birth defects so you should not become pregnant or father a child while on this medication.  Effective birth control should be used during treatment and for 3 weeks following the completion of therapy.  ??? Signs of an allergic reaction (rash, hives, shortness of breath)    Drug/Food Interactions     ??? Avoid grapefruit and grapefruit juice  ??? Medication list reviewed in Epic. The patient was instructed to inform the care team before taking any new medications or supplements including over the counter medications, vitamins, and herbal supplements. No drug interactions identified.     Storage, Handling Precautions, & Disposal   ??? This medication should be stored at room temperature and in a dry location. Keep out of reach of others including children and pets. Keep the medicine in the original container with a child-proof top (no pillboxes). Do not throw away or flush unused medication down the toilet or sink. This drug is considered hazardous and should be handled as little as possible.  If someone else helps with medication administration, they should wear gloves  Current Medications (including OTC/herbals), Comorbidities and Allergies     Current Outpatient Medications   Medication Sig Dispense Refill   ??? abemaciclib (VERZENIO) 150 mg Tab tablet Take 1 tablet (150 mg total) by mouth Two (2) times a day. Take with or without food. 56 tablet 5   ??? acetaminophen (TYLENOL) 325 MG tablet Take 650 mg by mouth every six (6) hours as needed for pain.     ??? calcium carbonate (TUMS) 200 mg calcium (500 mg) chewable tablet Chew 1 tablet daily.     ??? clobetasoL (TEMOVATE) 0.05 % ointment Apply topically Two (2) times a day. 30 g 0   ??? clotrimazole 1 % Oint Apply 1 application topically two (2) times a day. 60 g 0   ??? cyclobenzaprine (FLEXERIL) 5 MG tablet Take 1 tablet (5 mg total) by mouth Three (3) times a day as needed for muscle spasms. 30 tablet 0   ??? docusate sodium (COLACE) 100 MG capsule Take 100 mg by mouth. (Patient not taking: Reported on 08/08/2020)     ??? ergocalciferol-1,250 mcg, 50,000 unit, (VITAMIN D2-1,250 MCG, 50,000 UNIT,) 1,250 mcg (50,000 unit) capsule Take 1,250 mcg by mouth once a week.     ??? escitalopram oxalate (LEXAPRO) 10 MG tablet Take 1 tablet (10 mg total) by mouth daily. 30 tablet 11   ??? famotidine (PEPCID) 20 MG tablet Take 20 mg by mouth. (Patient not taking: Reported on 08/08/2020)     ??? letrozole (FEMARA) 2.5 mg tablet Take 1 tablet (2.5 mg total) by mouth daily. 90 tablet 3   ??? loratadine (CLARITIN) 10 mg tablet Take 10 mg by mouth daily. (Patient not taking: Reported on 08/08/2020)     ??? mometasone (ELOCON) 0.1 % cream Apply to breast twice daily 90 g 1   ??? OLANZapine (ZYPREXA) 5 MG tablet Take 1 tablet (5 mg total) by mouth nightly. On days 1, 2, 3, and 4 of chemotherapy. (Patient not taking: Reported on 08/08/2020) 16 tablet 0   ??? omeprazole (PRILOSEC) 20 MG capsule Take 1 capsule (20 mg total) by mouth daily. 90 capsule 3   ??? rimegepant (NURTEC ODT) 75 mg TbDL Take 75 mg by mouth.      ??? SUMAtriptan (IMITREX) 100 MG tablet TAKE ONE TABLET BY MOUTH AT ONSET OF MIGRAINE FOR UP TO 1 DOSE . IF SYMPTOMS PERSIST A SECOND DOSE MAY BE TAKEN IN 2 HOURS IF NEEDED     ??? topiramate (TOPAMAX) 25 MG tablet Take 25 mg by mouth daily. (Patient not taking: Reported on 08/08/2020)     ??? vitamin E-134 mg, 200 UNIT, 134 mg (200 UNIT) capsule Take 134 mg by mouth daily.       No current facility-administered medications for this visit.     Facility-Administered Medications Ordered in Other Visits   Medication Dose Route Frequency Provider Last Rate Last Admin   ??? leuprolide (LUPRON) 3.75 mg injection                No Known Allergies    Patient Active Problem List   Diagnosis   ??? Pseudotumor cerebri   ??? IIH (idiopathic intracranial hypertension)   ??? Migraines   ??? Gastroesophageal reflux disease without esophagitis   ??? Anxiety   ??? Malignant neoplasm of right breast in female, estrogen receptor positive (CMS-HCC)   ??? Vulvar ulcer   ??? Malignant neoplasm of right breast in female, estrogen receptor positive, unspecified site of breast (CMS-HCC)  Reviewed and up to date in Epic.    Appropriateness of Therapy     Is medication and dose appropriate based on diagnosis? Yes    Prescription has been clinically reviewed: Yes    Baseline Quality of Life Assessment      How many days over the past month did your condition  keep you from your normal activities? For example, brushing your teeth or getting up in the morning. 0    Financial Information     Medication Assistance provided: Prior Authorization    Anticipated copay of $0 reviewed with patient. Verified delivery address.    Delivery Information     Scheduled delivery date: 08/15/20    Expected start date: TBD (will double check with Tami Gibson start date for the patient and call the patient back)    Medication will be delivered via Same Day Courier to the prescription address in [place].  This shipment will not require a signature.      Explained the services we provide at [facility] and that each month we would call to set up refills.  Stressed importance of returning phone calls so that we could ensure they receive their medications in time each month.  Informed patient that we should be setting up refills 7-10 days prior to when they will run out of medication.  A pharmacist will reach out to perform a clinical assessment periodically.  Informed patient that a welcome packet, containing information about our pharmacy and other support services, a Notice of Privacy Practices, and a drug information handout will be sent.      Patient verbalized understanding of the above information as well as how to contact the pharmacy at [phone] option 4 with any questions/concerns.  The pharmacy is open Monday through Friday 8:30am-4:30pm.  A pharmacist is available 24/7 via pager to answer any clinical questions they may have.    Patient Specific Needs     - Does the patient have any physical, cognitive, or cultural barriers? No    - Patient prefers to have medications discussed with  Patient     - Is the patient or caregiver able to read and understand education materials at a high school level or above? Yes    - Patient's primary language is  English     - Is the patient high risk? Yes, patient is taking oral chemotherapy. Appropriateness of therapy as been assessed    - Does the patient require a Care Management Plan? No     - Does the patient require physician intervention or other additional services (i.e. nutrition, smoking cessation, social work)? No      Florene Route [facility] Specialty Pharmacist

## 2020-08-13 NOTE — Unmapped (Addendum)
APPOINTMENTS / QUESTIONS    [facility]  [address],   [place] [address]  T: [phone]  F: [phone]    [facility]  1st Floor, Ambulatory Care Center  [address]  [place], [place] [address]  T: [phone]  Website:  [facility]    [facility]  [address], [place], [place] [address]  Ground [facility]  T: [phone]  F: [phone]    [facility]  [address], [place], [place] [address]  T: [phone]  F: [phone]    [facility]  [address], [place], [place] [address]   1st Floor [facility]  T: [phone]  F: [phone]    [facility]  [address], [place], [place] [address]  Third Floor, [address]  T: [phone]   F: [phone]    Administrative Office  [address]  [place], [place] [address]  T:  [phone]  F:  [phone]      Physicians:  Nishant Nick Bhatt MD:   Nurse:  Nicole Bailey BSN, RN, CPSN [phone] [email or web address]  Nurse:  Holly Meehan BSN, RN, BC  [phone] [email or web address]  Surgery Scheduler: Cristina Etchart-Martin  [phone] [email or web address]     Jennifer Jeni Carr MD:   Nurse:  Nicole Bailey BSN, RN, CPSN [phone] [email or web address]   Nurse:  Natayla Cadenhead BAN, RN, CPSN  [phone]  [email or web address]    Surgery Scheduler: Cristina Etchart Martin  [phone] [email or web address]    Lynn Damitz MD:   Nurse:  Nicole Bailey BSN, RN, CPSN [phone] [email or web address]  Surgery Scheduler:   Cristina Etchart Martin  [phone] [email or web address]    Adeyemi Yemi Ogunleye MD:  Nurse: Holly Meehan BSN, RN, BC  [phone] [email or web address]  Surgery Scheduler: Kina Williamson [phone] [email or web address]    Jeyhan Wood MD:  Nurse: Dontrae Morini BSN, RN, CPSN  [phone] [email or web address]  Surgery Scheduler: Kina Williamson [phone] [email or web address]    Nurse Practitioner:  Renee E Edkins, DNP, NP-C:   Nurse: Dyshawn Cangelosi BSN, RN, CPSN  [phone]  [email or web address]  Surgery Scheduler: Kina Williamson [phone] [email or web address]    Surgery Scheduling:  You will receive a phone call from the surgery schedulers regarding date for surgery in 1-2 weeks from  your appointment.    Please call one of the above numbers if you have not received a phone call 2 weeks after your appointment with the provider.      AFTER HOURS/HOLIDAYS:  For emergencies after-hours (after 5pm, or weekends): call the [facility] operator ([phone]) and ask to page the Plastic Surgery resident on call. You will be directed to a surgery resident who likely is not immediately aware of the details of your case, but can help you deal with any emergencies that cannot wait until regular business hours.  Please be aware that this person is responding to many in-hospital emergencies and patient issues and may not answer your phone call immediately, but will return your call as soon as possible.    Financial Counselor:   T: [phone]  F: [phone]    Precare Location:   [facility]   [address]  [place], [place] [address] ([place] ??? [facility] near [address])    Blood Work Location:  [address],  [address]. There are no suite numbers, but it is located on the first floor there. Go to   the main desk and tell them you are there for walk-in labs.  Precare:   The day prior to your scheduled surgery, [facility] will call you with instructions.  If you have not heard from them by 4PM, and would like to check on the status of your surgery, please call:  [facility]: [phone]  [facility]: [phone]  [facility]: [phone]  [place]:  [phone]    [place] Hotline: [phone]    [facility] Contact Information: Please call to schedule  *Radiology (CT, X-ray, Ultrasound) [phone]  *Mammography & Breast Ultrasound [phone]  *MRI [phone]  *Interventional Radiology [phone]    [facility] forms and paperwork:  There is a $25 fee for each form that needs completed.  Please allow  a 2 week turn around for forms to be completed and faxed.      150 mL to left tissue expander today. Total fill now 250/600 mL on the LEFT.

## 2020-08-14 DIAGNOSIS — C50911 Malignant neoplasm of unspecified site of right female breast: Principal | ICD-10-CM

## 2020-08-14 DIAGNOSIS — Z170 Estrogen receptor positive status [ER+]: Principal | ICD-10-CM

## 2020-08-14 NOTE — Unmapped (Signed)
Pharmacist Medication Follow-Up    Cancer Team  Medical Oncology: Dr. Laney Pastor  Current treatment: Adjuvant abemaciclib + letrozole    Patient to start adjuvant letrozole on 3/23 and abemaciclib on 3/30.  Baseline labs will be obtained by 3/18.  C1 D15 labs scheduled for 4/13.

## 2020-08-16 ENCOUNTER — Other Ambulatory Visit: Admit: 2020-08-16 | Discharge: 2020-08-17 | Payer: PRIVATE HEALTH INSURANCE

## 2020-08-16 DIAGNOSIS — Z170 Estrogen receptor positive status [ER+]: Principal | ICD-10-CM

## 2020-08-16 DIAGNOSIS — C50911 Malignant neoplasm of unspecified site of right female breast: Principal | ICD-10-CM

## 2020-08-16 LAB — CBC W/ AUTO DIFF
BASOPHILS ABSOLUTE COUNT: 0 10*9/L (ref 0.0–0.1)
BASOPHILS RELATIVE PERCENT: 0.5 %
EOSINOPHILS ABSOLUTE COUNT: 0.1 10*9/L (ref 0.0–0.5)
EOSINOPHILS RELATIVE PERCENT: 1.7 %
HEMATOCRIT: 32.6 % — ABNORMAL LOW (ref 34.0–44.0)
HEMOGLOBIN: 10.7 g/dL — ABNORMAL LOW (ref 11.3–14.9)
LYMPHOCYTES ABSOLUTE COUNT: 0.7 10*9/L — ABNORMAL LOW (ref 1.1–3.6)
LYMPHOCYTES RELATIVE PERCENT: 18.2 %
MEAN CORPUSCULAR HEMOGLOBIN CONC: 33 g/dL (ref 32.0–36.0)
MEAN CORPUSCULAR HEMOGLOBIN: 23.1 pg — ABNORMAL LOW (ref 25.9–32.4)
MEAN CORPUSCULAR VOLUME: 69.9 fL — ABNORMAL LOW (ref 77.6–95.7)
MEAN PLATELET VOLUME: 7.8 fL (ref 6.8–10.7)
MONOCYTES ABSOLUTE COUNT: 0.4 10*9/L (ref 0.3–0.8)
MONOCYTES RELATIVE PERCENT: 8.9 %
NEUTROPHILS ABSOLUTE COUNT: 2.8 10*9/L (ref 1.8–7.8)
NEUTROPHILS RELATIVE PERCENT: 70.7 %
PLATELET COUNT: 178 10*9/L (ref 150–450)
RED BLOOD CELL COUNT: 4.65 10*12/L (ref 3.95–5.13)
RED CELL DISTRIBUTION WIDTH: 18.1 % — ABNORMAL HIGH (ref 12.2–15.2)
WBC ADJUSTED: 3.9 10*9/L (ref 3.6–11.2)

## 2020-08-16 LAB — HEPATIC FUNCTION PANEL
ALBUMIN: 3.7 g/dL (ref 3.4–5.0)
ALKALINE PHOSPHATASE: 112 U/L (ref 46–116)
ALT (SGPT): 30 U/L (ref 10–49)
AST (SGOT): 31 U/L (ref <=34)
BILIRUBIN DIRECT: 0.1 mg/dL (ref 0.00–0.30)
BILIRUBIN TOTAL: 0.4 mg/dL (ref 0.3–1.2)
PROTEIN TOTAL: 7.1 g/dL (ref 5.7–8.2)

## 2020-08-16 LAB — SLIDE REVIEW

## 2020-08-20 ENCOUNTER — Telehealth: Admit: 2020-08-20 | Discharge: 2020-08-21 | Payer: PRIVATE HEALTH INSURANCE | Attending: Clinical | Primary: Clinical

## 2020-08-21 ENCOUNTER — Institutional Professional Consult (permissible substitution): Admit: 2020-08-21 | Discharge: 2020-08-21 | Payer: PRIVATE HEALTH INSURANCE

## 2020-08-21 ENCOUNTER — Encounter: Admit: 2020-08-21 | Discharge: 2020-08-21 | Payer: PRIVATE HEALTH INSURANCE

## 2020-08-21 ENCOUNTER — Ambulatory Visit
Admit: 2020-08-21 | Discharge: 2020-09-10 | Payer: PRIVATE HEALTH INSURANCE | Attending: Rehabilitative and Restorative Service Providers" | Primary: Rehabilitative and Restorative Service Providers"

## 2020-08-21 ENCOUNTER — Ambulatory Visit
Admit: 2020-08-21 | Discharge: 2020-08-21 | Payer: PRIVATE HEALTH INSURANCE | Attending: Physician Assistant | Primary: Physician Assistant

## 2020-08-21 DIAGNOSIS — C50911 Malignant neoplasm of unspecified site of right female breast: Principal | ICD-10-CM

## 2020-08-21 DIAGNOSIS — Z170 Estrogen receptor positive status [ER+]: Principal | ICD-10-CM

## 2020-08-21 MED ORDER — ONDANSETRON HCL 4 MG TABLET
ORAL_TABLET | Freq: Four times a day (QID) | ORAL | 1 refills | 8 days | Status: CP | PRN
Start: 2020-08-21 — End: 2021-08-21

## 2020-08-21 NOTE — Unmapped (Addendum)
[facility]ERAP[facility] SERVICES [place]  OUTPATIENT PHYSICAL THERAPY  08/21/2020  Note Type: Treatment Note       Patient Name: Tami Gibson  Date of Birth:Sep 06, 1990  Diagnosis:   Encounter Diagnoses   Name Primary?   ??? Lymphedema syndrome, postmastectomy Yes   ??? Scar condition and fibrosis of skin    ??? Malignant neoplasm of right breast in female, estrogen receptor positive, unspecified site of breast (CMS-HCC)      Referring MD:  Jobe Marker*     Date of Onset of Impairment12-06-2019  Date PT Care Plan Established or Reviewed-06-13-2020  Date PT Treatment Started1-05-2021  Plan of Care Effective Date:  06-13-2020    Session #:  9    Contraindications:  none  Precautions:  none  Red Flags:  history of cancer, right breast IB          ASSESSMENT/Progress toward goals:   Pt is progressing toward meeting goals, Patient's skin integrity is improving.  Skin is softening with treatment which allows for improved lymph uptake, and decreased risk of infection, Pt is demonstrating proficiency at self care of edema/lymphedema and Pt is demonstrating continued limb/truncal swelling reduction and improve tissue integrity per visual inspection. Strength improving, ROM WFL.  Both arms have increased volumes today but are nearly equal in size and amount increased, not lymphedema.   Given instructions on where to obtain more sleeves online.       Recommendations for treatment/garments:  1) Patient will obtain proper garments to address swelling and improve tissue integrity. Garment recommendations :RTW 20-30 mm Hg sleeve prn, [place] bra prn ( can wear sports bra for truncal swelling)  2) Pt will be seen clinically for rehabilitation to include treatments to address as needed: edema/lymphedema, pain, ROM, strength, balance and endurance deficits, as well as learning self care strategies to address these deficits  3) Pt does not  require a pneumatic compression pump at this time.   Patient requires skilled Physical Therapy services for the following problem list and secondary functional limitations:    Problem List:   Lack of knowledge of lymphedema/edema self care  Uncontrolled swelling and/or lack of appropriate compression garment  Axillary web syndrome of involved quadrant   Decreased strength of involved extremity  Decreased endurance/fatigue    Secondary Functional Limitations:  Decreased knowledge of self care  of lymphedema/edema puts patient at risk for increased swelling and infection  Uncontrolled swelling can increase chances of  infection, and limit functional ROM   Axillary web syndrome can diminish ROM and is a  risk factor with lymphedema  Decreased strength can limit the patient's ADL's, work, and recreational activities   Decreased endurance/fatigue can limit patient's ability to perform ADL's, work, and recreational activities    Patient Goals: Decrease swelling, obtain a compression garment, decrease stiffness, Improve strength, return to recreational activities and return to work    Physical Therapy Goals:   In 1-3 months: Pt and/or caregiver will:  Be able to verbalize proper skin care, lymphedema risk reduction strategies, use and care of compression garments/devices MET 07-24-2020  Be able to demonstrate self MLD, donning/doffing garments to manage swelling MET 07-24-2020  Improve affected UE ROM  by 5-10 degrees to optimal functional range in ADL's( home, work, recreational and community) MET 07-24-2020  Improve affected UE strength by 1/2 muscle grade for optimal functioning for ADL's(home, work, recreational and community)  Improve endurance deficits demonstrated by improved test score on test TBD    In 6-12 months: Pt and/or  caregiver will:  Be independent with home management of self care related to lymphedema to reduce infection risk and recurrent hospitalizations  Retain an optimal limb volume that allows pt to fit into appropriate compression garments and clothing to improve functional use of UE and QOL  Stabilize/reduce deterioration of affected UE ROM in optimal functional range in all ADL's by continuing to proved patient education and evaluation for assistive devices to ensure safety and effectiveness in shoulder mobility and function on an ongoing basis  Stabilize /reduce deterioration of affected UE strength in ADL's (home, work, recreational, and community)  Education officer, museum for patient to be able to participate in ADL's(home, work, recreational, and community)         PLAN:     Planned frequency and duration  of treatment:   1 x week x 6 weeks    Next Visit Plan: , perform MLD,  UE stretching/strengthening   Pt to continue with current plan of care to educate in self care, precautions, and management of symptoms, optimal edema reduction/maintenance of girth, soft tissue changes, fitting of appropriate compression garments, and progression of exercises to optimize functional ROM, strength, balance and endurance in all ADL's(home, work, recreational, community)    SUBJECTIVE:  Patient???s communication preference: verbal, written, visual, prn   History of Present Illness/ Pt reports:  Still some tightness in axilla    Location of pain: No pain reported today  Notes some numbness and tingling in both feet       OBJECTIVE:      Posture/Observations:  WFL    Range of Motion/Flexibilty:   UE WFL   Axillary Web Syndrome location:  Mild right axilla     Strength/MMT:   UE WFL except right shoulder flexion and abduction grossly 4/5  Grip strength testing reveals: right: 29/27.3/24.2=26.8 NORMAL, left: 24.1/24.9/23.8=24.2 NORMAL    LE: [facility]      Girth Measurements: taken in cm    Date: 1-12 1-12 1-27 1-27 3-22 3-22        Right Left RIght Left Right Left RIght Left RIght Left   MCP 17.5 17 17 17  17.9 17.5       Mid palm 18.7 18 19 17 19  18.5       Thumb DIP   5.9 6         1st finger DIP   4.6 4.4         1st finger PIP   5.8 5.5         2nd finger DIP   4.8 4.6         2nd finger PIP   5.8 5.6         3rd finger DIP   4.3 4.2         3rd finger PIP   5.5 5.6         4th finger DIP   4 3.9         4th finger PIP   4.6 4.5                      Axilla             Mid breast nipple line             Ribs below breast             Natural Waistline             Umbilicus  Hips at  cm below umbilicus             Limb Volume  Affected Limb  Location: Right Arm  Arm Measurement 0 cm: 15.9  Arm Measurement 5 cm: 20.2  Arm Measurement 10 cm: 25  Arm Measurement 15 cm: 28.5  Arm Measurement 20 cm: 30  Arm Measurement 25 cm: 28.5  Arm Measurement 30 cm: 36.5  Arm Measurement 35 cm: 41  Arm Measurement 40 cm: 43.5  Arm Measurement 45 cm: 42  Unaffected Limb  Location: Left Arm  Arm #2 Measurement 0 cm: 15.6  Arm #2 Measurement 5 cm: 22  Arm #2 Measurement 10 cm: 26.1  Arm #2 Measurement 15 cm: 28  Arm #2 Measurement 20 cm: 29.5  Arm #2 Measurement 25 cm: 33  Arm #2 Measurement 30 cm: 40  Arm #2 Measurement 35 cm: 44  Arm #2 Measurement 40 cm: 43  Arm #2 Measurement 45 cm: 41  Arm Volume  Affected Arm Volume: 3335.41  Unaffected Arm Volume: 3570.28  Arm Volume Difference: -234.87  % Arm Difference: -6.58 %  Arm Difference from Last Visit: 34.51 %       Circumference for Medi Harmony Sleeve cG 40 cm, cE 28.5 cm, cC1 21.5 cm, cC 16.2 cm, cA 18.5 cm     Location of swelling:  right axilla hand and fingers     Skin condition:  Stemmer???s sign:no.  dry and increased skin thickness  Incision/Scar:   incision healing well postoperatively  reduced scar mobility   scar adhesion   myofascial restrictions     Sensation :  Peripheral neuropathy:none  Light touch:     Decreased sensation under axilla, around scar, and posterior lateral upper arm   Proprioception:  not tested  Vibration:   not tested    Gait:  WNL  Balance: pt reports no balance issues    Endurance/Fatigue: pt reports fatigue after return to work   DME needs related to safety:none      Total Treatment Time: 53 min    Treatment Rendered:    Patient wore mask during entire treatment time, and therapist wore mask, goggles      Manual x 30 min   Myofascial release/Spontaneous Muscle Release Techniques, STM to affected areas  and MLD to appropriate anastomoses in order to decrease swelling.  Application of compression garment/devices prn to prevent re-accumulation of fluid. Reviewed self care during session.    23  min: Therapeutic exercise: pulley warmups,  green and red theraband, 1 set of 10: rows, extension, ER, IR, diagonals, arm circles in table top position on floor, triceps     Equipment provided/recommended:   N/A    Communication/consultation with other professionals:  N/A    Referrals made to the following providers:  none    Medical Necessity: This treatment is medically necessary throughout the course of this patient's life during and after cancer treatments to improve functional activities and /or to minimize the decline of functional abilities and worsening of symptoms,including infection and recurrent hospitalizations, through independent/home management strategies.  Lymphedema, a chronic, progressive condition for which there is no cure, is marked by the accumulation of protein-rich fluid in one or more quadrants of the body due to primary or secondary disruption of the lymphatic system.  The sustained accumulation results in tissue inflammation, an increase in fatty tissue, and development of obstructive connective tissue.  These changes may result in an increased risk of infection, disfigurement and a decrease in mobility  and functional performance.   Pt will benefit from physical therapy by a certified lymphedema therapist to address : education in self care, precautions, and management of symptoms, optimal edema reduction/maintenance of girth, optimal soft tissue changes, fitting of appropriate compression garments, and progression of exercises to optimize functional ROM and strength, balance, and endurance in all ADL's(home, work, recreational, community).    I attest that I have reviewed the above information.  Signed: Clementeen Graham, PT   08/21/2020 1:19 PM        OUTPATIENT PHYSICAL THERAPY  08/21/2020  Note Type: Treatment Note       Patient Name: Tami Gibson  Date of Birth:1990-09-06  Diagnosis:   Encounter Diagnoses   Name Primary?   ??? Lymphedema syndrome, postmastectomy Yes   ??? Scar condition and fibrosis of skin    ??? Malignant neoplasm of right breast in female, estrogen receptor positive, unspecified site of breast (CMS-HCC)      Referring MD:  Jobe Marker*     Date of Onset of Impairment-No date available  Date PT Care Plan Established or Reviewed-No date available  Date PT Treatment Started-No date available   Plan of Care Effective Date:     Assessment/Plan:    Assessment/Plan    Subjective:   Subjective    Objective:   Objective                                I attest that I have reviewed the above information.  Signed: Clementeen Graham, PT  08/21/2020 1:19 PM

## 2020-08-21 NOTE — Unmapped (Signed)
HISTORY OF PRESENT ILLNESS: Tami Gibson comes in today for an expansion procedure. She has no complaints and tolerated the last procedure well.  She has had some increased peeling of the skin on her right axilla and neck.    EXPANSION:  Patient was put in supine position. Her tissue expander was identified with a magnet, marked with a marking pen. The site (s) were cleansed with alcohol and ChloraPrep. A long 22 needle to access her ports. Metal backing was palpated and position confirmed by aspirating back saline from the expander. A total of 150 mL of sterile normal saline was put into the left tissue expander without difficulty. This brings her cumulative volume today to 450/600. She has all the necessary contact numbers, if she has any questions or concerns. We will see her back in 1 week.     PHYSICAL EXAM:  All tissue is viable and healthy.  Incision intact.  No evidence of seroma, purulence, cellulitis, or infection.       Dr Lafayette Dragon  was not present for physical examination but formulated the above plan of care.

## 2020-08-21 NOTE — Unmapped (Signed)
Assessment     Tami Gibson is a 30 y.o. female presenting to [facility] for COVID testing.     Problem List Items Addressed This Visit     None      Visit Diagnoses     Contact with and (suspected) exposure to covid-19    -  Primary    Relevant Orders    RAPID INFLUENZA/RSV/COVID PCR    Contact with and (suspected) exposure to other viral communicable diseases        Relevant Orders    RAPID INFLUENZA/RSV/COVID PCR          Plan     If no testing performed, pt counseled on routine care for respiratory illness.  If testing performed, COVID sent.  Patient directed to Home given findings during today's visit.    Subjective     Tami Gibson is a 30 y.o. female who [facility] with complaints of the following:    Exposure History: In the last 21 days?     Have you traveled outside of [place]? No               Have you been in close contact with someone confirmed by a test to have COVID? (Close contact is within 6 feet for at least 10 minutes) Yes, Who? 0       Have you worked in a health care facility? Yes, Where: [place], Occupation/ Job: Therapist, occupational     Lived or worked facility like a nursing home, group home, or assisted living?    No         Are you scheduled to have surgery or a procedure in the next 3 days? No               Are you scheduled to receive cancer chemotherapy within the next 7 days?    No     Have you ever been tested before for COVID-19 with a swab of your nose? 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 1   Headache Yes, how many days? 1   Abdominal Pain No   Diarrhea (3 or more loose stools in last 24 hours) No     History/Medical Conditions (as stated by patient on intake form):    Do you have any of the following:   Asthma or emphysema or COPD No   Cystic Fibrosis No   Diabetes No   High Blood Pressure  No   Cardiovascular Disease No   Chronic Kidney Disease No   Chronic Liver Disease No   Chronic blood disorder like Sickle Cell Disease  No   Weak immune system due to disease or medication Yes   Neurologic condition that limits movement  No   Developmental delay -  Moderate to Severe  No   Recent (within past 2 weeks) or current Pregnancy No   Morbid Obesity (>100 pounds over ideal weight) No   Current Smoker No   Former Smoker No       Objective     Given above, testing performed: Yes    Testing Performed:  Test Specimen Type Sent to   COVID-19  NP [facility] [employer]       Scribe's Attestation: [place], [place] obtained and performed the history, physical exam and medical decision making elements that were entered into the chart.  Signed by Georgette Shell serving as Scribe, on 08/21/2020 3:42 PM    The documentation recorded by the scribe accurately reflects the service I personally performed and the decisions made by me. Desirie Minteer F. Latanya Maudlin  August 22, 2020 7:45 AM

## 2020-08-21 NOTE — Unmapped (Signed)
08/21/20    Patient is calling because they: would like to schedule a COVID-19 test.    Patient is experiencing symptoms severe enough that they would like to speak with a nurse? No    Patient would like COVID-19 testing. Advised COVID-19 test scheduling is available via your My [facility] Chart account. You may also contact your [facility] clinic for help with scheduling a COVID-19 test. You can also find COVID-19 testing available at local health department locations or local pharmacies.     The call was resolved in the following manner: Testing scheduled   Tami Gibson employee and immunocompromised.

## 2020-08-21 NOTE — Unmapped (Signed)
[facility]  Comprehensive Cancer Support Program   Telehealth Encounter    *non billable encounter*      Encounter Description: This encounter was conducted via live, face-to-face video conference in the setting of State of Emergency due to COVID-19 Pandemic.     The patient reports they are currently: not at home. I spent 35 minutes on the real-time audio and video with the patient on the date of service. I spent an additional 15 minutes on pre- and post-visit activities on the date of service.     The patient was physically located in [place] or a state in which I am permitted to provide care. The patient and/or parent/guardian understood that s/he may incur co-pays and cost sharing, and agreed to the telemedicine visit. The visit was reasonable and appropriate under the circumstances given the patient's presentation at the time.    The patient and/or parent/guardian has been advised of the potential risks and limitations of this mode of treatment (including, but not limited to, the absence of in-person examination) and has agreed to be treated using telemedicine. The patient's/patient's family's questions regarding telemedicine have been answered.     If the visit was completed in an ambulatory setting, the patient and/or parent/guardian has also been advised to contact their provider???s office for worsening conditions, and seek emergency medical treatment and/or call 911 if the patient deems either necessary.      Assessment:  Tami Gibson is a 30 y.o. female with a history of breast cancer. Referred by Luetta Nutting, PT for support navigating survivorship and long term impact of treatment (lymphedema). She describes a normal and healthy emotional response to treatment and is interested in engaging in therapy as she transitions from active treatment to survivorship.     Risk Assessment:  A suicide and violence risk assessment was performed as part of this evaluation. There is no acute risk for suicide or violence at this time.  While future psychiatric events cannot be accurately predicted, the patient does not currently require acute inpatient psychiatric care and does not currently meet [facility] involuntary commitment criteria.       Plan:  Will continue to follow for supportive counseling.      Subjective:   Patient interviewed in a private place, accompanied by no one. She shared updates since our last visit including ongoing treatment plans. Reports that she struggled initially with news that she is going to be getting lupron shots as well as taking letrozole, but is grateful she doesn't need oral chemo. Was also thrown that her reconstructive surgery will need to happen 9-12 months after completing radiation, which is longer than she anticipated. Overall reports that she is coping fairly well and hopeful that side effects from treatment won't be difficult. Provided supportive counseling, active listening, normalization, and psychoeducation. Pt engaged and open to meeting again.         Objective:    Mental Status Exam:  Speech/Language:    Normal rate, volume, tone, fluency   Mood:   euthymic   Thought process and Associations:   Logical, linear, clear, coherent, goal directed   Abnormal/psychotic thought content:     Denies SI, HI, self harm, delusions, obsessions, paranoid ideation, or ideas of reference   Perceptual disturbances:     Does not endorse auditory or visual hallucinations     Orientation:   Oriented to person, place, time, and general circumstances   Insight:     Intact   Judgment:  Intact   Impulse Control:   Intact     [name], LCSW  Comprehensive Cancer Support Program  Phone: [phone]  Pager: [phone]

## 2020-08-24 DIAGNOSIS — R221 Localized swelling, mass and lump, neck: Principal | ICD-10-CM

## 2020-08-24 DIAGNOSIS — C50911 Malignant neoplasm of unspecified site of right female breast: Principal | ICD-10-CM

## 2020-08-24 DIAGNOSIS — Z170 Estrogen receptor positive status [ER+]: Principal | ICD-10-CM

## 2020-08-28 ENCOUNTER — Ambulatory Visit: Admit: 2020-08-28 | Discharge: 2020-08-29 | Payer: PRIVATE HEALTH INSURANCE

## 2020-08-28 DIAGNOSIS — Z9889 Other specified postprocedural states: Principal | ICD-10-CM

## 2020-08-28 NOTE — Unmapped (Signed)
[facility] THERAPY SERVICES [place]  OUTPATIENT PHYSICAL THERAPY  08/28/2020  Note Type: Treatment Note       Patient Name: Tami Gibson  Date of Birth:[birth year]  Diagnosis:   Encounter Diagnoses   Name Primary?   ??? Lymphedema syndrome, postmastectomy Yes   ??? Scar condition and fibrosis of skin    ??? Malignant neoplasm of right breast in female, estrogen receptor positive, unspecified site of breast (CMS-HCC)      Referring MD:  Jobe Marker*     Date of Onset of Impairment12-06-2019  Date PT Care Plan Established or Reviewed-06-13-2020  Date PT Treatment Started1-05-2021  Plan of Care Effective Date:  06-13-2020    Session #:  10    Contraindications:  none  Precautions:  none  Red Flags:  history of cancer, right breast IB          ASSESSMENT/Progress toward goals:   Pt is progressing toward meeting goals, Patient's skin integrity is improving.  Skin is softening with treatment which allows for improved lymph uptake, and decreased risk of infection, Pt is demonstrating proficiency at self care of edema/lymphedema, Pt is demonstrating continued limb/truncal swelling reduction and improve tissue integrity, ROM continues to improve despite cording right axilla and Strength continues to improve both UE's as noted with exercises.      Recommendations for treatment/garments:  1) Patient will obtain proper garments to address swelling and improve tissue integrity. Garment recommendations :RTW 20-30 mm Hg sleeve prn, [place] bra prn ( can wear sports bra for truncal swelling)  2) Pt will be seen clinically for rehabilitation to include treatments to address as needed: edema/lymphedema, pain, ROM, strength, balance and endurance deficits, as well as learning self care strategies to address these deficits  3) Pt does not  require a pneumatic compression pump at this time.   Patient requires skilled Physical Therapy services  for the following problem list and secondary functional limitations:    Problem List:   Lack of knowledge of lymphedema/edema self care  Uncontrolled swelling and/or lack of appropriate compression garment  Axillary web syndrome of involved quadrant   Decreased strength of involved extremity  Decreased endurance/fatigue    Secondary Functional Limitations:  Decreased knowledge of self care  of lymphedema/edema puts patient at risk for increased swelling and infection  Uncontrolled swelling can increase chances of  infection, and limit functional ROM   Axillary web syndrome can diminish ROM and is a  risk factor with lymphedema  Decreased strength can limit the patient's ADL's, work, and recreational activities   Decreased endurance/fatigue can limit patient's ability to perform ADL's, work, and recreational activities    Patient Goals: Decrease swelling, obtain a compression garment, decrease stiffness, Improve strength, return to recreational activities and return to work    Physical Therapy Goals:   In 1-3 months: Pt and/or caregiver will:  Be able to verbalize proper skin care, lymphedema risk reduction strategies, use and care of compression garments/devices MET 07-24-2020  Be able to demonstrate self MLD, donning/doffing garments to manage swelling MET 07-24-2020  Improve affected UE ROM  by 5-10 degrees to optimal functional range in ADL's( home, work, recreational and community) MET 07-24-2020  Improve affected UE strength by 1/2 muscle grade for optimal functioning for ADL's(home, work, recreational and community)  Improve endurance deficits demonstrated by improved test score on test TBD    In 6-12 months: Pt and/or caregiver will:  Be independent with home management of self care related to lymphedema to reduce infection risk  and recurrent hospitalizations  Retain an optimal limb volume that allows pt to fit into appropriate compression garments and clothing to improve functional use of UE and QOL  Stabilize/reduce deterioration of affected UE ROM in optimal functional range in all ADL's by continuing to proved patient education and evaluation for assistive devices to ensure safety and effectiveness in shoulder mobility and function on an ongoing basis  Stabilize /reduce deterioration of affected UE strength in ADL's (home, work, recreational, and community)  Education officer, museum for patient to be able to participate in ADL's(home, work, recreational, and community)         PLAN:     Planned frequency and duration  of treatment:   1 x week x 6 weeks    Next Visit Plan: , perform MLD,  UE stretching/strengthening, check circumferential measurements   Pt to continue with current plan of care to educate in self care, precautions, and management of symptoms, optimal edema reduction/maintenance of girth, soft tissue changes, fitting of appropriate compression garments, and progression of exercises to optimize functional ROM, strength, balance and endurance in all ADL's(home, work, recreational, community)    SUBJECTIVE:  Patient???s communication preference: verbal, written, visual, prn   History of Present Illness/ Pt reports:  Still some tightness in axilla with cording, noted a swollen node in neck, going to have an ultrasound to check it.  Some tightness in left upper pec after expansion  Sleeve digging into elbow a bit. ( this happens with ready to wear, pt to try piece of cloth there)  Therapist unable to palpate node     Location of pain: No pain reported today  Notes some numbness and tingling in both feet       OBJECTIVE:      Posture/Observations:  WFL    Range of Motion/Flexibilty:   UE WFL   Axillary Web Syndrome location:  Mild right axilla     Strength/MMT:   UE WFL except right shoulder flexion and abduction grossly 4/5  Grip strength testing reveals: right: 29/27.3/24.2=26.8 NORMAL, left: 24.1/24.9/23.8=24.2 NORMAL    LE: [facility]      Girth Measurements: taken in cm    Date: 1-12 1-12 1-27 1-27 3-22 3-22        Right Left RIght Left Right Left RIght Left RIght Left   MCP 17.5 17 17 17  17.9 17.5       Mid palm 18.7 18 19 17 19  18.5       Thumb DIP   5.9 6         1st finger DIP   4.6 4.4         1st finger PIP   5.8 5.5         2nd finger DIP   4.8 4.6         2nd finger PIP   5.8 5.6         3rd finger DIP   4.3 4.2         3rd finger PIP   5.5 5.6         4th finger DIP   4 3.9         4th finger PIP   4.6 4.5                      Axilla             Mid breast nipple line  Ribs below breast             Natural Waistline             Umbilicus             Hips at  cm below umbilicus             Limb Volume                Circumference for Medi Harmony Sleeve cG 40 cm, cE 28.5 cm, cC1 21.5 cm, cC 16.2 cm, cA 18.5 cm     Location of swelling:  right axilla hand and fingers     Skin condition:  Stemmer???s sign:no.  dry and increased skin thickness  Incision/Scar:   incision healing well postoperatively  reduced scar mobility   scar adhesion   myofascial restrictions     Sensation :  Peripheral neuropathy:none  Light touch:     Decreased sensation under axilla, around scar, and posterior lateral upper arm   Proprioception:  not tested  Vibration:   not tested    Gait:  WNL  Balance: pt reports no balance issues    Endurance/Fatigue: pt reports fatigue after return to work   DME needs related to safety:none      Total Treatment Time: 53 min    Treatment Rendered:    Patient wore mask during entire treatment time, and therapist wore mask, goggles      Manual x 30 min   Myofascial release/Spontaneous Muscle Release Techniques, STM to affected areas , Lymphatouch to affected areas with simultaneous MFR and MLD to appropriate anastomoses in order to decrease swelling.  Application of compression garment/devices prn to prevent re-accumulation of fluid.  23  min: Therapeutic exercise: pulley warmups,  green and red theraband, 1 set of 10: rows, extension, ER, IR, diagonals, obliques,  triceps     Equipment provided/recommended:   N/A    Communication/consultation with other professionals:  N/A    Referrals made to the following providers:  none    Medical Necessity: This treatment is medically necessary throughout the course of this patient's life during and after cancer treatments to improve functional activities and /or to minimize the decline of functional abilities and worsening of symptoms,including infection and recurrent hospitalizations, through independent/home management strategies.  Lymphedema, a chronic, progressive condition for which there is no cure, is marked by the accumulation of protein-rich fluid in one or more quadrants of the body due to primary or secondary disruption of the lymphatic system.  The sustained accumulation results in tissue inflammation, an increase in fatty tissue, and development of obstructive connective tissue.  These changes may result in an increased risk of infection, disfigurement and a decrease in mobility and functional performance.   Pt will benefit from physical therapy by a certified lymphedema therapist to address : education in self care, precautions, and management of symptoms, optimal edema reduction/maintenance of girth, optimal soft tissue changes, fitting of appropriate compression garments, and progression of exercises to optimize functional ROM and strength, balance, and endurance in all ADL's(home, work, recreational, community).    I attest that I have reviewed the above information.  Signed: Clementeen Graham, PT   08/28/2020 9:37 AM

## 2020-08-28 NOTE — Unmapped (Signed)
Tami Pigeon RN dictating a nurse visit    HISTORY OF PRESENT ILLNESS: Tami Gibson comes in today for an expansion procedure. She has no complaints and tolerated the last procedure well.  She has had some increased peeling of the skin on her right axilla and neck.  ??  EXPANSION:  Patient was put in supine position. Her tissue expander was identified with a magnet, marked with a marking pen. The site (s) were cleansed with alcohol and ChloraPrep. A long 22 needle to access her ports. Metal backing was palpated and position confirmed by aspirating back saline from the expander. A total of 50 mL of sterile normal saline was put into the left tissue expander without difficulty. This brings her cumulative volume today to 500/600. She has all the necessary contact numbers, if she has any questions or concerns. We will see her back in 2 weeks for final expansion.   ??  PHYSICAL EXAM:  All tissue is viable and healthy.  Incision intact.  No evidence of seroma, purulence, cellulitis, or infection.       Impression and Plan  May cancel appt with Dr Tami Gibson in 2 weeks  Return in 2 weeks for final expansion  ??  Dr Tami Gibson  was not present for physical examination but formulated the above plan of care.

## 2020-08-28 NOTE — Unmapped (Signed)
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 She is now s/p adjuvant PMRT to the right chest wall and nodes.     Treatment intent: curative    Treatment plan:  Plan: PMRT to right chest wall and regional nodes  200cGy x 25 fractions = 5000cGy, 3D-CRT, DIBH    Clinical trial: no    Chemotherapy: administered before radiation    Radiation treatment summary:      Treatment site Treatment Technique/Modality Dose per fraction Total number  of fractions Total dose Start date End date   Right chest wall and nodes 3D CRT 200 cGy 25 5000 cGy 07/02/20   08/06/20     Completed intended course:  Yes    Treatment break >2 weeks:  No    Tolerance to treatment:  Mild toxicities or complications not requiring treatment including grade 1 dermatitis     Plan for follow up:   Follow up with Dr. Laney Pastor 08/08/20   Follow up with plastic surgery on 08/13/20  Follow up with surgical oncology 11/16/20  We will see her back in 6 months       Electronically signed by:  Duard Larsen, MD  Radiation Oncology Resident, PGY-4  [facility]  Pager: [phone]    Annabelle Harman L. Baird Lyons, MD  Assistant Professor  Department of Radiation Oncology

## 2020-08-28 NOTE — Unmapped (Signed)
APPOINTMENTS / QUESTIONS    [facility] Plastic Surgery  [address],   [place] [address]  T: [phone]  F: [phone]    [facility] Center  1st Floor, Ambulatory Care Center  [address]  [place], [place] [address]  T: [phone]  Website:  [facility]    [facility]  [address] [place], [place] [address]  Ground Floor [facility]  T: [phone]  F: [phone]    [facility]  [address] [place], [place] [address]  T: [phone]  F: [phone]    [facility]  [address] [place], [place] [address]   1st Floor [facility]  T: [phone]  F: [phone]    [facility]  [address], [place], [place] [address]  Third Floor, [address]  T: [phone]   F: [phone]    Administrative Office  [address]  [place], [place] [address]  T:  [phone]  F:  [phone]      Physicians:  Jackquline Bosch MD:   Nurse:  [name] BSN, RN, CPSN [phone] [email or web address]  Nurse:  Mila Palmer BSN, RN, [place]  [phone] [email or web address]  Surgery Scheduler: Lysle Morales  [phone] [email or web address]     Epimenio Sarin MD:   Nurse:  [name] BSN, RN, CPSN [phone] [email or web address]   Nurse:  Venetia Maxon BAN, RN, CPSN  [phone]  [email or web address]    Surgery Scheduler: Lysle Morales  [phone] [email or web address]    Tarry Kos MD:   Nurse:  [name] BSN, RN, CPSN [phone] [email or web address]  Surgery Scheduler:   Lysle Morales  [phone] [email or web address]    Adeyemi Clydie Braun MD:  Nurse: Mila Palmer BSN, RN, [facility]  [phone] [email or web address]  Surgery Scheduler: Kristine Royal [phone] [email or web address]    Darleene Cleaver MD:  Nurse: Venetia Maxon BSN, RN, CPSN  [phone] [email or web address]  Surgery Scheduler: Kristine Royal [phone] [email or web address]    Nurse Practitioner:  Ezekiel Slocumb, DNP, NP-C:   Nurse: Venetia Maxon BSN, RN, CPSN  [phone]  [email or web address]  Surgery Scheduler: Kristine Royal [phone] [email or web address]    Surgery Scheduling:  You will receive a phone call from the surgery schedulers regarding date for surgery in 1-2 weeks from  your appointment.    Please call one of the above numbers if you have not received a phone call 2 weeks after your appointment with the provider.      AFTER HOURS/HOLIDAYS:  For emergencies after-hours (after 5pm, or weekends): call the [facility] operator [phone]) and ask to page the Plastic Surgery resident on call. You will be directed to a surgery resident who likely is not immediately aware of the details of your case, but can help you deal with any emergencies that cannot wait until regular business hours.  Please be aware that this person is responding to many in-hospital emergencies and patient issues and may not answer your phone call immediately, but will return your call as soon as possible.    Financial Counselor:   T: [phone]  F: [phone]    Precare Location:  [facility] Pre[facility]Procedure Services at [facility])   [address]  [place], [place] [address] [facility] ??? old [address] near [employer])    Blood Work Location:  [address] [place] [address]. There are no suite numbers, but it is located on the first floor there. Go to  the main desk and tell them you are there for walk-in labs.  Precare:   The day prior to your scheduled surgery, Pre-Care will call you with instructions.  If you have not heard from them by 4PM, and would like to check on the status of your surgery, please call:  [facility]: [phone]  [facility]: [phone]  [facility]: [phone]  [place]:  [phone]    Weather Hotline: [phone]    [facility] Imaging Contact Information: Please call to schedule  *Radiology (CT, X-ray, Ultrasound) [phone]  *Mammography & Breast Ultrasound [phone]  *MRI [phone]  *Interventional Radiology [phone]    [facility] forms and paperwork:  There is a $25 fee for each form that needs completed.  Please allow  a 2 week turn around for forms to be completed and faxed.

## 2020-08-29 DIAGNOSIS — C50911 Malignant neoplasm of unspecified site of right female breast: Principal | ICD-10-CM

## 2020-08-29 DIAGNOSIS — Z170 Estrogen receptor positive status [ER+]: Principal | ICD-10-CM

## 2020-08-31 DIAGNOSIS — Z170 Estrogen receptor positive status [ER+]: Principal | ICD-10-CM

## 2020-08-31 DIAGNOSIS — C50911 Malignant neoplasm of unspecified site of right female breast: Principal | ICD-10-CM

## 2020-09-03 ENCOUNTER — Telehealth: Admit: 2020-09-03 | Discharge: 2020-09-04 | Payer: PRIVATE HEALTH INSURANCE | Attending: Clinical | Primary: Clinical

## 2020-09-03 NOTE — Unmapped (Signed)
[facility] THERAPY SERVICES [place]  OUTPATIENT PHYSICAL THERAPY  09/03/2020  Note Type: Treatment Note       Patient Name: Tami Gibson  Date of Birth:[birth year]  Diagnosis:   Encounter Diagnoses   Name Primary?   ??? Lymphedema syndrome, postmastectomy Yes   ??? Scar condition and fibrosis of skin    ??? Malignant neoplasm of right breast in female, estrogen receptor positive, unspecified site of breast (CMS-HCC)      Referring MD:  Jobe Marker*     Date of Onset of Impairment12-06-2019  Date PT Care Plan Established or Reviewed-06-13-2020  Date PT Treatment Started1-05-2021  Plan of Care Effective Date:  06-13-2020    Session #:  11    Contraindications:  none  Precautions:  none  Red Flags:  history of cancer, right breast IB          ASSESSMENT/Progress toward goals:   Pt is progressing toward meeting goals, Patient's skin integrity is improving.  Skin is softening with treatment which allows for improved lymph uptake, and decreased risk of infection, Pt is demonstrating proficiency at self care of edema/lymphedema, Pt is demonstrating continued limb/truncal swelling reduction and improve tissue integrity, ROM continues to improve despite cording right axilla and Strength continues to improve both UE's as noted with exercises.    Tolerating more difficult exercises in the gym    Recommendations for treatment/garments:  1) Patient will obtain proper garments to address swelling and improve tissue integrity. Garment recommendations :RTW 20-30 mm Hg sleeve prn, [place] bra prn ( can wear sports bra for truncal swelling)  2) Pt will be seen clinically for rehabilitation to include treatments to address as needed: edema/lymphedema, pain, ROM, strength, balance and endurance deficits, as well as learning self care strategies to address these deficits  3) Pt does not  require a pneumatic compression pump at this time.   Patient requires skilled Physical Therapy services  for the following problem list and secondary functional limitations:    Problem List:   Lack of knowledge of lymphedema/edema self care  Uncontrolled swelling and/or lack of appropriate compression garment  Axillary web syndrome of involved quadrant   Decreased strength of involved extremity  Decreased endurance/fatigue    Secondary Functional Limitations:  Decreased knowledge of self care  of lymphedema/edema puts patient at risk for increased swelling and infection  Uncontrolled swelling can increase chances of  infection, and limit functional ROM   Axillary web syndrome can diminish ROM and is a  risk factor with lymphedema  Decreased strength can limit the patient's ADL's, work, and recreational activities   Decreased endurance/fatigue can limit patient's ability to perform ADL's, work, and recreational activities    Patient Goals: Decrease swelling, obtain a compression garment, decrease stiffness, Improve strength, return to recreational activities and return to work    Physical Therapy Goals:   In 1-3 months: Pt and/or caregiver will:  Be able to verbalize proper skin care, lymphedema risk reduction strategies, use and care of compression garments/devices MET 07-24-2020  Be able to demonstrate self MLD, donning/doffing garments to manage swelling MET 07-24-2020  Improve affected UE ROM  by 5-10 degrees to optimal functional range in ADL's( home, work, recreational and community) MET 07-24-2020  Improve affected UE strength by 1/2 muscle grade for optimal functioning for ADL's(home, work, recreational and community) MET 09-03-20  Improve endurance deficits demonstrated by improved test score on test TBD    In 6-12 months: Pt and/or caregiver will:  Be independent with home management  of self care related to lymphedema to reduce infection risk and recurrent hospitalizations  Retain an optimal limb volume that allows pt to fit into appropriate compression garments and clothing to improve functional use of UE and QOL  Stabilize/reduce deterioration of affected UE ROM in optimal functional range in all ADL's by continuing to proved patient education and evaluation for assistive devices to ensure safety and effectiveness in shoulder mobility and function on an ongoing basis  Stabilize /reduce deterioration of affected UE strength in ADL's (home, work, recreational, and community)  Education officer, museum for patient to be able to participate in ADL's(home, work, recreational, and community)         PLAN:     Planned frequency and duration  of treatment:   1 x week x 6 weeks    Next Visit Plan: , perform MLD,  UE stretching/strengthening,   Pt to continue with current plan of care to educate in self care, precautions, and management of symptoms, optimal edema reduction/maintenance of girth, soft tissue changes, fitting of appropriate compression garments, and progression of exercises to optimize functional ROM, strength, balance and endurance in all ADL's(home, work, recreational, community)    SUBJECTIVE:  Patient???s communication preference: verbal, written, visual, prn   History of Present Illness/ Pt reports:  Still with cording     Location of pain: No pain reported today  Notes some numbness and tingling in both feet       OBJECTIVE:      Posture/Observations:  WFL    Range of Motion/Flexibilty:   UE WFL   Axillary Web Syndrome location:  Mild right axilla     Strength/MMT:   UE WFL except right shoulder flexion and abduction grossly 4+/5  Grip strength testing reveals: right: 29/27.3/24.2=26.8 NORMAL, left: 24.1/24.9/23.8=24.2 NORMAL    LE: [facility]      Girth Measurements: taken in cm    Date: 1-12 1-12 1-27 1-27 3-22 3-22 4-4 4-4      Right Left RIght Left Right Left RIght Left RIght Left   MCP 17.5 17 17 17  17.9 17.5 16.5 16.5     Mid palm 18.7 18 19 17 19  18.5 17.5 17.5     Thumb DIP   5.9 6         1st finger DIP   4.6 4.4         1st finger PIP   5.8 5.5         2nd finger DIP   4.8 4.6         2nd finger PIP   5.8 5.6         3rd finger DIP   4.3 4.2         3rd finger PIP   5.5 5.6         4th finger DIP   4 3.9         4th finger PIP   4.6 4.5                      Axilla             Mid breast nipple line             Ribs below breast             Natural Waistline             Umbilicus             Hips at  cm below  umbilicus             Limb Volume  Affected Limb  Location: Right Arm  Arm Measurement 0 cm: 15  Arm Measurement 5 cm: 20  Arm Measurement 10 cm: 24.5  Arm Measurement 15 cm: 27.5  Arm Measurement 20 cm: 30  Arm Measurement 25 cm: 27  Arm Measurement 30 cm: 33  Arm Measurement 35 cm: 40  Arm Measurement 40 cm: 39  Arm Measurement 45 cm: 38  Unaffected Limb  Location: Left Arm  Arm #2 Measurement 0 cm: 15  Arm #2 Measurement 5 cm: 21  Arm #2 Measurement 10 cm: 25  Arm #2 Measurement 15 cm: 27.2  Arm #2 Measurement 20 cm: 28  Arm #2 Measurement 25 cm: 30.4  Arm #2 Measurement 30 cm: 36  Arm #2 Measurement 35 cm: 41  Arm #2 Measurement 40 cm: 41  Arm #2 Measurement 45 cm: 41  Arm Volume  Affected Arm Volume: 2948.6  Unaffected Arm Volume: 3207.51  Arm Volume Difference: -258.91  % Arm Difference: -8.07 %  Arm Difference from Last Visit: 24.04 %           Location of swelling:  right axilla hand and fingers     Skin condition:  Stemmer???s sign:no.  dry and increased skin thickness  Incision/Scar:   incision healing well postoperatively  reduced scar mobility   scar adhesion   myofascial restrictions     Sensation :  Peripheral neuropathy:none  Light touch:     Decreased sensation under axilla, around scar, and posterior lateral upper arm   Proprioception:  not tested  Vibration:   not tested    Gait:  WNL  Balance: pt reports no balance issues    Endurance/Fatigue: pt reports fatigue after return to work   DME needs related to safety:none      Total Treatment Time: 53 min    Treatment Rendered:    Patient wore mask during entire treatment time, and therapist wore mask, goggles      Manual x 30 min   Myofascial release/Spontaneous Muscle Release Techniques, STM to affected areas , Lymphatouch to affected areas with simultaneous MFR and MLD to appropriate anastomoses in order to decrease swelling.  Application of compression garment/devices prn to prevent re-accumulation of fluid.  23  min: Therapeutic exercise: pulley warmups,  green and red theraband, 1 set of 10: rows, extension on tiptoes, ER, IR, diagonals, lateral abduction, quadruped alt arms and legs, tabletop, pull downs and triceps ( alternating legs to 45)     Equipment provided/recommended:   N/A    Communication/consultation with other professionals:  N/A    Referrals made to the following providers:  none    Medical Necessity: This treatment is medically necessary throughout the course of this patient's life during and after cancer treatments to improve functional activities and /or to minimize the decline of functional abilities and worsening of symptoms,including infection and recurrent hospitalizations, through independent/home management strategies.  Lymphedema, a chronic, progressive condition for which there is no cure, is marked by the accumulation of protein-rich fluid in one or more quadrants of the body due to primary or secondary disruption of the lymphatic system.  The sustained accumulation results in tissue inflammation, an increase in fatty tissue, and development of obstructive connective tissue.  These changes may result in an increased risk of infection, disfigurement and a decrease in mobility and functional performance.   Pt will benefit from physical therapy by a certified lymphedema  therapist to address : education in self care, precautions, and management of symptoms, optimal edema reduction/maintenance of girth, optimal soft tissue changes, fitting of appropriate compression garments, and progression of exercises to optimize functional ROM and strength, balance, and endurance in all ADL's(home, work, recreational, community).    I attest that I have reviewed the above information.  Signed: Clementeen Graham, PT   09/03/2020 9:29 AM

## 2020-09-04 NOTE — Unmapped (Signed)
[facility]  Comprehensive Cancer Support Program   Telehealth Encounter    *non billable encounter*      Encounter Description: This encounter was conducted via live, face-to-face video conference in the setting of State of Emergency due to COVID-19 Pandemic.     The patient reports they are currently: not at home. I spent 35 minutes on the real-time audio and video with the patient on the date of service. I spent an additional 15 minutes on pre- and post-visit activities on the date of service.     The patient was physically located in [place] or a state in which I am permitted to provide care. The patient and/or parent/guardian understood that s/he may incur co-pays and cost sharing, and agreed to the telemedicine visit. The visit was reasonable and appropriate under the circumstances given the patient's presentation at the time.    The patient and/or parent/guardian has been advised of the potential risks and limitations of this mode of treatment (including, but not limited to, the absence of in-person examination) and has agreed to be treated using telemedicine. The patient's/patient's family's questions regarding telemedicine have been answered.     If the visit was completed in an ambulatory setting, the patient and/or parent/guardian has also been advised to contact their provider???s office for worsening conditions, and seek emergency medical treatment and/or call 911 if the patient deems either necessary.      Assessment:  Tami Gibson is a 30 y.o. female with a history of breast cancer. Referred by Luetta Nutting, PT for support navigating survivorship and long term impact of treatment (lymphedema). She describes a normal and healthy emotional response to treatment and is interested in engaging in therapy as she transitions from active treatment to survivorship.     Risk Assessment:  A suicide and violence risk assessment was performed as part of this evaluation. There is no acute risk for suicide or violence at this time.  While future psychiatric events cannot be accurately predicted, the patient does not currently require acute inpatient psychiatric care and does not currently meet [facility] involuntary commitment criteria.       Plan:  Will continue to follow for supportive counseling.      Subjective:   Patient interviewed in a private place, accompanied by no one. She shared updates since our last visit including ongoing treatment plans. Experiencing increased anxiety 2/2 enlarged lymph node in her neck. Has head and neck ultrasound scheduled and knows it's likely enlarged due to fighting off infection, but notes that it's easy for her mind to go to worse case scenarios. Describes this anxiety as passing when discussing the appts, does not disturb sleep or take up space in her day to day life. Doing well with oral medications at this time. Spent time discussing ways of increasing positive physical experiences ([place], massage, etc.), especially as she continues to recover from year of oncology treatments and surgery. Overall reports that she is coping fairly well and hopeful that side effects from treatment won't be difficult. Provided supportive counseling, active listening, normalization, and psychoeducation. Pt engaged and open to meeting again.         Objective:    Mental Status Exam:  Speech/Language:    Normal rate, volume, tone, fluency   Mood:   euthymic   Thought process and Associations:   Logical, linear, clear, coherent, goal directed   Abnormal/psychotic thought content:     Denies SI, HI, self harm, delusions,  obsessions, paranoid ideation, or ideas of reference   Perceptual disturbances:     Does not endorse auditory or visual hallucinations     Orientation:   Oriented to person, place, time, and general circumstances   Insight:     Intact   Judgment:    Intact   Impulse Control:   Intact     [name], LCSW  Comprehensive Cancer Support Program  Phone: [phone]  Pager: [phone]

## 2020-09-05 ENCOUNTER — Institutional Professional Consult (permissible substitution): Admit: 2020-09-05 | Discharge: 2020-09-06 | Payer: PRIVATE HEALTH INSURANCE

## 2020-09-05 DIAGNOSIS — C50911 Malignant neoplasm of unspecified site of right female breast: Principal | ICD-10-CM

## 2020-09-05 DIAGNOSIS — Z170 Estrogen receptor positive status [ER+]: Principal | ICD-10-CM

## 2020-09-05 MED ADMIN — leuprolide (LUPRON) injection 3.75 mg: 3.75 mg | INTRAMUSCULAR | @ 14:00:00 | Stop: 2020-09-05

## 2020-09-05 NOTE — Unmapped (Signed)
Pt received lupron 3.75mg  injection in right ventroGluteus. Tolerated it well. Applied guaze and band aid.

## 2020-09-05 NOTE — Unmapped (Signed)
Pharmacist Phone Follow-Up    Cancer Team  Medical Oncology: Dr. 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  She started adjuvant endocrine therapy with leuprolide depot and letrozole and then started abemaciclib on 3/30 per the [facility] trial.    She has been on abemaciclib for one week. She reports that she has not had any adverse effects since starting the medication.  She did have a migraine yesterday but she has baseline headaches.     Plan:  1. Continue abemaciclib 150 mg BID  2. C1D15 labs on 4/13  ________________________________________________________________________     Interval History   TamiBevans is a 30 y.o. female with ER+/PR+/HER2- breast cancer who I am calling to follow-up on tolerance to abemaciclib.  Tami Gibson confirmed that she started the medication one week ago.  She has not had any N/V or diarrhea.  She had a migraine yesterday which she treated with sumatriptan.  She has baseline migraines.  She has not noticed any other adverse effects.    Oral chemotherapy:  Abemaciclib 150 mg BID  Start date: 08/29/20  Pharmacy: [facility]     Adherence: No missed doses    Adverse Effects: None    Drug interactions: None identified    Medication list reviewed and updated in Epic.    Patient verbalized understanding of the above information.    Breast Oncology    Breast Oncology Metrics:         Chemotherapy Dose: Dose documented     Chemotherapy Schedule: Schedule documented     NCI CTCAE: Nausea/Vomiting - None/Grade 0, Diarrhea - None/Grade 0     Comments: No interventions            I spent 10 minutes on the phone with the patient on the date of service. I spent an additional 10 minutes on pre- and post-visit activities.     The patient was physically located in [place] or a state in which I am permitted to provide care. The patient and/or parent/guardian understood that s/he may incur co-pays and cost sharing, and agreed to the telemedicine visit. The visit was reasonable and appropriate under the circumstances given the patient's presentation at the time.    The patient and/or parent/guardian has been advised of the potential risks and limitations of this mode of treatment (including, but not limited to, the absence of in-person examination) and has agreed to be treated using telemedicine. The patient's/patient's family's questions regarding telemedicine have been answered.     If the visit was completed in an ambulatory setting, the patient and/or parent/guardian has also been advised to contact their provider???s office for worsening conditions, and seek emergency medical treatment and/or call 911 if the patient deems either necessary.      Oncology History Overview Note   30 year old female with recently self palpated right breast lump. A targeted right breast ultrasound on 11/15/2019 showed an irregular mass with spiculated margins at the 7:00 position, 6 cm from the nipple, measuring 1.4 x 1.2 x 1.0 cm. There were 2 abnormal level 1 axillary lymph nodes measuring 1.4 x 1.3 cm and 1.2 x 0.9 cm. Additionally, there was a high level 1/level 2 axillary lymph node, measuring 2.1  x 1.3 cm with an effaced fatty hilum.      Malignant neoplasm of right breast in female, estrogen receptor positive (CMS-HCC)   11/09/2019 Initial Diagnosis    Malignant neoplasm of right breast in female, estrogen receptor positive (CMS-HCC)     11/15/2019 Biopsy    Invasive ductal carcinoma  - Nottingham combined histologic grade: 1  ER 91-100%, PR 91-100%, HER2 negative by IHC    Lymph node, right axilla, core biopsy  - Lymph node positive for metastatic carcinoma,      12/02/2019 -  Cancer Staged    Staging form: Breast, AJCC 8th Edition  - Clinical: Stage IB (cT1c, cN1, cM0, G1, ER+, PR+, HER2-) - Signed by [name], MD on 12/02/2019       12/09/2019 -  Chemotherapy    OP BREAST AC (Dose Dense) q2W X 4 CYCLES, THEN PACLITAXEL (DOSE DENSE) Q2W x 4 CYCLES  DOXOrubicin 60 mg/m2 IV on day 1, cyclophosphamide 600 mg/m2 IV on day 1, every 2 weeks for 4 cycles, then PACLitaxel 175 mg/m2 every 2 weeks for 4 cycles      04/20/2020 Surgery    Breast, right, simple mastectomy  -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 Extracapsular extension is identified      Breast, left, simple mastectomy  -Benign breast tissue with no atypia, in situ or invasive carcinoma identified     04/20/2020 -  Cancer Staged    Staging form: Breast, AJCC 8th Edition  - Pathologic stage from 04/20/2020: No Stage Recommended (ypT1c, pN1a, cM0, G2, ER+, PR+, HER2-) - Signed by [name], MD on 04/25/2020       05/07/2020 Surgery    Lymph nodes, right axillary, dissection  -One of sixteen lymph nodes involved by metastatic adenocarcinoma (1/16)  -Metastatic focus measures 3 mm in greatest dimension  -No extracapsular extension is identified     06/28/2020 - 08/06/2020 Radiation    The total radiation dose will be 5000 cGy at 200 cGy/fraction for a total of 25 fractions, treated once a day     08/13/2020 Endocrine/Hormone Therapy    Letrozole 2.5mg  +Ovarian suppression (monthly lupron)     Malignant neoplasm of right breast in female, estrogen receptor positive, unspecified site of breast (CMS-HCC)   05/07/2020 Surgery    Lymph nodes, right axillary, dissection  -One of sixteen lymph nodes involved by metastatic adenocarcinoma (1/16)  -Metastatic focus measures 3 mm in greatest dimension  -No extracapsular extension is identified     06/13/2020 -  Radiation    Radiation Therapy Treatment Details (Noted on 06/13/2020)  Site: Right Breast - Overlapping sites  Technique: 3D CRT  Goal: No goal specified  Planned Treatment Start Date: No planned start date specified     06/21/2020 -  Radiation    Radiation Therapy Treatment Details (Noted on 06/21/2020)  Site: Right Breast - Overlapping sites  Technique: 3D CRT  Goal: No goal specified  Planned Treatment Start Date: No planned start date specified          Pertinent Labs:   No visits with results within 1 Day(s) from this visit.   Latest known visit with results is:   Office Visit on 08/21/2020   Component Date Value Ref Range Status   ??? SARS-CoV-2 PCR 08/21/2020 Not Detected  Not Detected Final   ??? Influenza  A 08/21/2020 Not Detected  Not Detected Final   ??? Influenza B 08/21/2020 Not Detected  Not Detected Final   ??? RSV 08/21/2020 Not Detected  Not Detected Final       Current medications:   Current Outpatient Medications   Medication Sig Dispense Refill   ??? abemaciclib (VERZENIO) 150 mg Tab tablet Take 1 tablet (150 mg total) by mouth Two (2) times a day. Take with or without food. 56 tablet 5   ??? acetaminophen (TYLENOL) 325 MG tablet Take 650 mg by mouth every six (6) hours as needed for pain.     ??? calcium carbonate (TUMS) 200 mg calcium (500 mg) chewable tablet Chew 1 tablet daily.     ??? clobetasoL (TEMOVATE) 0.05 % ointment Apply topically Two (2) times a day. (Patient not taking: Reported on 08/21/2020) 30 g 0   ??? clotrimazole 1 % Oint Apply 1 application topically two (2) times a day. (Patient not taking: Reported on 08/21/2020) 60 g 0   ??? cyclobenzaprine (FLEXERIL) 5 MG tablet Take 1 tablet (5 mg total) by mouth Three (3) times a day as needed for muscle spasms. (Patient not taking: Reported on 08/21/2020) 30 tablet 0   ??? docusate sodium (COLACE) 100 MG capsule Take 100 mg by mouth. (Patient not taking: Reported on 08/08/2020)     ??? ergocalciferol-1,250 mcg, 50,000 unit, (VITAMIN D2-1,250 MCG, 50,000 UNIT,) 1,250 mcg (50,000 unit) capsule Take 1,250 mcg by mouth once a week.     ??? escitalopram oxalate (LEXAPRO) 10 MG tablet Take 1 tablet (10 mg total) by mouth daily. 30 tablet 11   ??? famotidine (PEPCID) 20 MG tablet Take 20 mg by mouth. (Patient not taking: Reported on 08/08/2020)     ??? letrozole (FEMARA) 2.5 mg tablet Take 1 tablet (2.5 mg total) by mouth daily. 90 tablet 3   ??? loratadine (CLARITIN) 10 mg tablet Take 10 mg by mouth daily. (Patient not taking: Reported on 08/08/2020)     ??? mometasone (ELOCON) 0.1 % cream Apply to breast twice daily 90 g 1   ??? OLANZapine (ZYPREXA) 5 MG tablet Take 1 tablet (5 mg total) by mouth nightly. On days 1, 2, 3, and 4 of chemotherapy. (Patient not taking: Reported on 08/08/2020) 16 tablet 0   ??? omeprazole (PRILOSEC) 20 MG capsule Take 1 capsule (20 mg total) by mouth daily. 90 capsule 3   ??? ondansetron (ZOFRAN) 4 MG tablet Take 1 tablet (4 mg total) by mouth every six (6) hours as needed for nausea. 30 tablet 1   ??? rimegepant (NURTEC ODT) 75 mg TbDL Take 75 mg by mouth.      ??? SUMAtriptan (IMITREX) 100 MG tablet TAKE ONE TABLET BY MOUTH AT ONSET OF MIGRAINE FOR UP TO 1 DOSE . IF SYMPTOMS PERSIST A SECOND DOSE MAY BE TAKEN IN 2 HOURS IF NEEDED     ??? topiramate (TOPAMAX) 25 MG tablet Take 25 mg by mouth daily. (Patient not taking: Reported on 08/08/2020)     ??? vitamin E-134 mg, 200 UNIT, 134 mg (200 UNIT) capsule Take 134 mg by mouth daily.       No current facility-administered medications for this visit.     Facility-Administered Medications Ordered in Other Visits   Medication Dose Route Frequency Provider Last Rate Last Admin   ??? leuprolide (LUPRON) 3.75 mg injection

## 2020-09-10 NOTE — Unmapped (Signed)
Tami Pigeon RN dictating a nurse visit  ??  HISTORY OF PRESENT ILLNESS:??Tami Gibson??comes in today for an expansion procedure. She has no complaints and tolerated the last procedure well.  She has had some increased peeling of the skin on her right axilla and neck.  ??  EXPANSION: ??Patient was put in supine position. Her tissue expander was??identified with a magnet, marked with a marking pen. The site (s) were cleansed with alcohol and ChloraPrep. A long 22??needle to access her ports. Metal backing was palpated and position confirmed by aspirating back saline from the expander. A total of 50??mL of sterile normal saline was put into the left??tissue expander without difficulty. This brings her cumulative volume today to 600/600. She has all the necessary contact numbers, if she has any questions or concerns. ??  PHYSICAL EXAM: ??All tissue is viable and healthy. ??Incision intact. ??No evidence of seroma, purulence, cellulitis, or infection. ????  ??  Impression and Plan  Request for a bilateral DIEP for recon case entered??  -return in 6 motns  Dr Tami Gibson????was not present for physical examination but formulated the above plan of care.

## 2020-09-10 NOTE — Unmapped (Signed)
APPOINTMENTS / QUESTIONS    [facility] Plastic Surgery  [address],   [place] [address]  T: [phone]  F: [phone]    [facility] Center  1st Floor, Ambulatory Care Center  [address]  [place], [place] [address]  T: [phone]  Website:  [facility]    [facility]  [address] [place], [place] [address]  Ground Floor [facility]  T: [phone]  F: [phone]    [facility]  [address] [place], [place] [address]  T: [phone]  F: [phone]    [facility]  [address] [place], [place] [address]   1st Floor [facility]  T: [phone]  F: [phone]    [facility]  [address], [place], [place] [address]  Third Floor, [address]  T: [phone]   F: [phone]    Administrative Office  [address]  [place], [place] [address]  T:  [phone]  F:  [phone]      Physicians:  Jackquline Bosch MD:   Nurse:  [name] BSN, RN, CPSN [phone] [email or web address]  Nurse:  Mila Palmer BSN, RN, [place]  [phone] [email or web address]  Surgery Scheduler: Lysle Morales  [phone] [email or web address]     Epimenio Sarin MD:   Nurse:  [name] BSN, RN, CPSN [phone] [email or web address]   Nurse:  Venetia Maxon BAN, RN, CPSN  [phone]  [email or web address]    Surgery Scheduler: Lysle Morales  [phone] [email or web address]    Tarry Kos MD:   Nurse:  [name] BSN, RN, CPSN [phone] [email or web address]  Surgery Scheduler:   Lysle Morales  [phone] [email or web address]    Adeyemi Clydie Braun MD:  Nurse: Mila Palmer BSN, RN, [facility]  [phone] [email or web address]  Surgery Scheduler: Kristine Royal [phone] [email or web address]    Darleene Cleaver MD:  Nurse: Venetia Maxon BSN, RN, CPSN  [phone] [email or web address]  Surgery Scheduler: Kristine Royal [phone] [email or web address]    Nurse Practitioner:  Ezekiel Slocumb, DNP, NP-C:   Nurse: Venetia Maxon BSN, RN, CPSN  [phone]  [email or web address]  Surgery Scheduler: Kristine Royal [phone] [email or web address]    Surgery Scheduling:  You will receive a phone call from the surgery schedulers regarding date for surgery in 1-2 weeks from  your appointment.    Please call one of the above numbers if you have not received a phone call 2 weeks after your appointment with the provider.      AFTER HOURS/HOLIDAYS:  For emergencies after-hours (after 5pm, or weekends): call the [facility] operator [phone]) and ask to page the Plastic Surgery resident on call. You will be directed to a surgery resident who likely is not immediately aware of the details of your case, but can help you deal with any emergencies that cannot wait until regular business hours.  Please be aware that this person is responding to many in-hospital emergencies and patient issues and may not answer your phone call immediately, but will return your call as soon as possible.    Financial Counselor:   T: [phone]  F: [phone]    Precare Location:  [facility] Pre[facility]Procedure Services at [facility])   [address]  [place], [place] [address] [facility] ??? old [address] near [employer])    Blood Work Location:  [address] [place] [address]. There are no suite numbers, but it is located on the first floor there. Go to  the main desk and tell them you are there for walk-in labs.  Precare:   The day prior to your scheduled surgery, Pre-Care will call you with instructions.  If you have not heard from them by 4PM, and would like to check on the status of your surgery, please call:  [facility]: [phone]  [facility]: [phone]  [facility]: [phone]  [place]:  [phone]    Weather Hotline: [phone]    [facility] Imaging Contact Information: Please call to schedule  *Radiology (CT, X-ray, Ultrasound) [phone]  *Mammography & Breast Ultrasound [phone]  *MRI [phone]  *Interventional Radiology [phone]    [facility] forms and paperwork:  There is a $25 fee for each form that needs completed.  Please allow  a 2 week turn around for forms to be completed and faxed.

## 2020-09-11 ENCOUNTER — Ambulatory Visit
Admit: 2020-09-11 | Discharge: 2020-10-10 | Payer: PRIVATE HEALTH INSURANCE | Attending: Rehabilitative and Restorative Service Providers" | Primary: Rehabilitative and Restorative Service Providers"

## 2020-09-11 ENCOUNTER — Ambulatory Visit: Admit: 2020-09-11 | Discharge: 2020-09-12 | Payer: PRIVATE HEALTH INSURANCE

## 2020-09-11 DIAGNOSIS — C50911 Malignant neoplasm of unspecified site of right female breast: Principal | ICD-10-CM

## 2020-09-11 DIAGNOSIS — Z7189 Other specified counseling: Principal | ICD-10-CM

## 2020-09-11 DIAGNOSIS — Z170 Estrogen receptor positive status [ER+]: Principal | ICD-10-CM

## 2020-09-12 ENCOUNTER — Ambulatory Visit: Admit: 2020-09-12 | Discharge: 2020-09-13 | Payer: PRIVATE HEALTH INSURANCE

## 2020-09-12 ENCOUNTER — Other Ambulatory Visit: Admit: 2020-09-12 | Discharge: 2020-09-13 | Payer: PRIVATE HEALTH INSURANCE

## 2020-09-12 DIAGNOSIS — Z170 Estrogen receptor positive status [ER+]: Principal | ICD-10-CM

## 2020-09-12 DIAGNOSIS — C50911 Malignant neoplasm of unspecified site of right female breast: Principal | ICD-10-CM

## 2020-09-12 LAB — HEPATIC FUNCTION PANEL
ALBUMIN: 4.3 g/dL (ref 3.4–5.0)
ALKALINE PHOSPHATASE: 114 U/L (ref 46–116)
ALT (SGPT): 14 U/L (ref 10–49)
AST (SGOT): 20 U/L (ref <=34)
BILIRUBIN DIRECT: 0.1 mg/dL (ref 0.00–0.30)
BILIRUBIN TOTAL: 0.4 mg/dL (ref 0.3–1.2)
PROTEIN TOTAL: 8 g/dL (ref 5.7–8.2)

## 2020-09-12 NOTE — Unmapped (Unsigned)
Peripheral stick done by DMaxwell, 23g L H, labs drawn and sent.

## 2020-09-12 NOTE — Unmapped (Signed)
[facility] THERAPY SERVICES [place]  OUTPATIENT PHYSICAL THERAPY  09/11/2020  Note Type: Treatment Note       Patient Name: Tami Gibson  Date of Birth:1990-08-01  Diagnosis:   Encounter Diagnoses   Name Primary?   ??? Lymphedema syndrome, postmastectomy Yes   ??? Scar condition and fibrosis of skin    ??? Malignant neoplasm of right breast in female, estrogen receptor positive, unspecified site of breast (CMS-HCC)      Referring MD:  Jobe Marker*     Date of Onset of Impairment12-06-2019  Date PT Care Plan Established or Reviewed-06-13-2020  Date PT Treatment Started1-05-2021  Plan of Care Effective Date:  06-13-2020    Session #:  12    Contraindications:  none  Precautions:  none  Red Flags:  history of cancer, right breast IB          ASSESSMENT/Progress toward goals:   Pt is progressing toward meeting goals and Patient's skin integrity is improving.  Skin is softening with treatment which allows for improved lymph uptake, and decreased risk of infection  Pt did not wear sleeve for a few days, limb volumes are increased, but right arm is still smaller than left.  Discussed with patient signs and symptoms of increased swelling so that she may trial times without her sleeve.  Discussed self care, pt understood.      Recommendations for treatment/garments:  1) Patient will obtain proper garments to address swelling and improve tissue integrity. Garment recommendations :RTW 20-30 mm Hg sleeve prn, [place] bra prn ( can wear sports bra for truncal swelling)  2) Pt will be seen clinically for rehabilitation to include treatments to address as needed: edema/lymphedema, pain, ROM, strength, balance and endurance deficits, as well as learning self care strategies to address these deficits  3) Pt does not  require a pneumatic compression pump at this time.   Patient requires skilled Physical Therapy services  for the following problem list and secondary functional limitations:    Problem List:   Lack of knowledge of lymphedema/edema self care  Uncontrolled swelling and/or lack of appropriate compression garment  Axillary web syndrome of involved quadrant   Decreased strength of involved extremity  Decreased endurance/fatigue    Secondary Functional Limitations:  Decreased knowledge of self care  of lymphedema/edema puts patient at risk for increased swelling and infection  Uncontrolled swelling can increase chances of  infection, and limit functional ROM   Axillary web syndrome can diminish ROM and is a  risk factor with lymphedema  Decreased strength can limit the patient's ADL's, work, and recreational activities   Decreased endurance/fatigue can limit patient's ability to perform ADL's, work, and recreational activities    Patient Goals: Decrease swelling, obtain a compression garment, decrease stiffness, Improve strength, return to recreational activities and return to work    Physical Therapy Goals:   In 1-3 months: Pt and/or caregiver will:  Be able to verbalize proper skin care, lymphedema risk reduction strategies, use and care of compression garments/devices MET 07-24-2020  Be able to demonstrate self MLD, donning/doffing garments to manage swelling MET 07-24-2020  Improve affected UE ROM  by 5-10 degrees to optimal functional range in ADL's( home, work, recreational and community) MET 07-24-2020  Improve affected UE strength by 1/2 muscle grade for optimal functioning for ADL's(home, work, recreational and community) MET 09-03-20  Improve endurance deficits demonstrated by improved test score on test TBD    In 6-12 months: Pt and/or caregiver will:  Be independent with home  management of self care related to lymphedema to reduce infection risk and recurrent hospitalizations  Retain an optimal limb volume that allows pt to fit into appropriate compression garments and clothing to improve functional use of UE and QOL  Stabilize/reduce deterioration of affected UE ROM in optimal functional range in all ADL's by continuing to proved patient education and evaluation for assistive devices to ensure safety and effectiveness in shoulder mobility and function on an ongoing basis  Stabilize /reduce deterioration of affected UE strength in ADL's (home, work, recreational, and community)  Education officer, museum for patient to be able to participate in ADL's(home, work, recreational, and community)         PLAN:     Planned frequency and duration  of treatment:   1 x week x 6 weeks    Next Visit Plan: , perform MLD,  UE stretching/strengthening,   Pt to continue with current plan of care to educate in self care, precautions, and management of symptoms, optimal edema reduction/maintenance of girth, soft tissue changes, fitting of appropriate compression garments, and progression of exercises to optimize functional ROM, strength, balance and endurance in all ADL's(home, work, recreational, community)    SUBJECTIVE:  Patient???s communication preference: verbal, written, visual, prn   History of Present Illness/ Pt reports:  Still with cording     Location of pain: No pain reported today  Notes some numbness and tingling in both feet       OBJECTIVE:      Posture/Observations:  WFL    Range of Motion/Flexibilty:   UE WFL   Axillary Web Syndrome location:  Mild right axilla     Strength/MMT:   UE WFL except right shoulder flexion and abduction grossly 4+/5  Grip strength testing reveals: right: 29/27.3/24.2=26.8 NORMAL, left: 24.1/24.9/23.8=24.2 NORMAL    LE: [facility]      Girth Measurements: taken in cm    Date: 1-12 1-12 1-27 1-27 3-22 3-22 4-4 4-4 4-12 4-12    Right Left RIght Left Right Left RIght Left RIght Left   MCP 17.5 17 17 17  17.9 17.5 16.5 16.5 17 17    Mid palm 18.7 18 19 17 19  18.5 17.5 17.5 17.5 18.5   Thumb DIP   5.9 6         1st finger DIP   4.6 4.4         1st finger PIP   5.8 5.5         2nd finger DIP   4.8 4.6         2nd finger PIP   5.8 5.6         3rd finger DIP   4.3 4.2         3rd finger PIP   5.5 5.6         4th finger DIP   4 3.9         4th finger PIP   4.6 4.5                      Axilla             Mid breast nipple line             Ribs below breast             Natural Waistline             Umbilicus             Hips at  cm  below umbilicus             Limb Volume  Affected Limb  Location: Right Arm  Arm Measurement 0 cm: 15.2  Arm Measurement 5 cm: 20  Arm Measurement 10 cm: 25.1  Arm Measurement 15 cm: 28.5  Arm Measurement 20 cm: 30  Arm Measurement 25 cm: 27  Arm Measurement 30 cm: 35  Arm Measurement 35 cm: 39  Arm Measurement 40 cm: 40  Arm Measurement 45 cm: 39  Unaffected Limb  Location: Left Arm  Arm #2 Measurement 0 cm: 15  Arm #2 Measurement 5 cm: 20  Arm #2 Measurement 10 cm: 24.5  Arm #2 Measurement 15 cm: 26.6  Arm #2 Measurement 20 cm: 28.2  Arm #2 Measurement 25 cm: 30  Arm #2 Measurement 30 cm: 36  Arm #2 Measurement 35 cm: 41  Arm #2 Measurement 40 cm: 41  Arm #2 Measurement 45 cm: 39  Arm Volume  Affected Arm Volume: 3045.61  Unaffected Arm Volume: 3121.28  Arm Volume Difference: -75.67  % Arm Difference: -2.42 %  Arm Difference from Last Visit: -183.24 %           Location of swelling:  right axilla hand and fingers     Skin condition:  Stemmer???s sign:no.  dry and increased skin thickness  Incision/Scar:   incision healing well postoperatively  reduced scar mobility   scar adhesion   myofascial restrictions     Sensation :  Peripheral neuropathy:none  Light touch:     Decreased sensation under axilla, around scar, and posterior lateral upper arm   Proprioception:  not tested  Vibration:   not tested    Gait:  WNL  Balance: pt reports no balance issues    Endurance/Fatigue: pt reports fatigue after return to work   DME needs related to safety:none      Total Treatment Time: 53 min    Treatment Rendered:    Patient and/or caregiver and therapist were mask compliant per current [place] COVID mask policy during the entire treatment session.        Manual x 30 min   Myofascial release/Spontaneous Muscle Release Techniques, STM to affected areas , Lymphatouch to affected areas with simultaneous MFR and MLD to appropriate anastomoses in order to decrease swelling.  Application of compression garment/devices prn to prevent re-accumulation of fluid.  23  min: Therapeutic exercise: pulley warmups,  green and red theraband, 1 set of 10: rows with squats, extension on one leg, ER, IR in tandem, diagonals, lawnmower,  Throwing ball chest press, overhead, side throws    Equipment provided/recommended:   N/A    Communication/consultation with other professionals:  N/A    Referrals made to the following providers:  none    Medical Necessity: This treatment is medically necessary throughout the course of this patient's life during and after cancer treatments to improve functional activities and /or to minimize the decline of functional abilities and worsening of symptoms,including infection and recurrent hospitalizations, through independent/home management strategies.  Lymphedema, a chronic, progressive condition for which there is no cure, is marked by the accumulation of protein-rich fluid in one or more quadrants of the body due to primary or secondary disruption of the lymphatic system.  The sustained accumulation results in tissue inflammation, an increase in fatty tissue, and development of obstructive connective tissue.  These changes may result in an increased risk of infection, disfigurement and a decrease in mobility and functional performance.   Pt will benefit from  physical therapy by a certified lymphedema therapist to address : education in self care, precautions, and management of symptoms, optimal edema reduction/maintenance of girth, optimal soft tissue changes, fitting of appropriate compression garments, and progression of exercises to optimize functional ROM and strength, balance, and endurance in all ADL's(home, work, recreational, community).    I attest that I have reviewed the above information.  Signed: Clementeen Graham, PT   09/11/2020 6:23 PM

## 2020-09-13 DIAGNOSIS — Z170 Estrogen receptor positive status [ER+]: Principal | ICD-10-CM

## 2020-09-13 DIAGNOSIS — C50911 Malignant neoplasm of unspecified site of right female breast: Principal | ICD-10-CM

## 2020-09-18 ENCOUNTER — Ambulatory Visit: Admit: 2020-09-18 | Discharge: 2020-09-19 | Payer: PRIVATE HEALTH INSURANCE

## 2020-09-19 ENCOUNTER — Ambulatory Visit: Admit: 2020-09-19 | Discharge: 2020-10-02 | Payer: PRIVATE HEALTH INSURANCE

## 2020-09-19 MED ADMIN — iohexoL (OMNIPAQUE) 350 mg iodine/mL solution 100 mL: 100 mL | INTRAVENOUS | @ 13:00:00 | Stop: 2020-09-19

## 2020-09-19 MED ADMIN — Technetium Tc-99m Oxidronate HDP: 26.4 | INTRAVENOUS | @ 12:00:00 | Stop: 2020-09-19

## 2020-09-19 NOTE — Unmapped (Signed)
[facility] THERAPY SERVICES [place]  OUTPATIENT PHYSICAL THERAPY  09/18/2020  Note Type: Treatment Note       Patient Name: Tami Gibson  Date of Birth:1991/03/14  Diagnosis:   Encounter Diagnoses   Name Primary?   ??? Lymphedema syndrome, postmastectomy Yes   ??? Scar condition and fibrosis of skin    ??? Malignant neoplasm of right breast in female, estrogen receptor positive, unspecified site of breast (CMS-HCC)      Referring MD:  Jobe Marker*     Date of Onset of Impairment12-06-2019  Date PT Care Plan Established or Reviewed-06-13-2020  Date PT Treatment Started1-05-2021  Plan of Care Effective Date:  06-13-2020    Session #:  13    Contraindications:  none  Precautions:  none  Red Flags:  history of cancer, right breast IB          ASSESSMENT/Progress toward goals:   Pt is progressing toward meeting goals, Patient's skin integrity is improving.  Skin is softening with treatment which allows for improved lymph uptake, and decreased risk of infection and Pt is demonstrating proficiency at self care of edema/lymphedema  Pt's strength improving.         Recommendations for treatment/garments:  1) Patient will obtain proper garments to address swelling and improve tissue integrity. Garment recommendations :RTW 20-30 mm Hg sleeve prn, [place] bra prn ( can wear sports bra for truncal swelling)  2) Pt will be seen clinically for rehabilitation to include treatments to address as needed: edema/lymphedema, pain, ROM, strength, balance and endurance deficits, as well as learning self care strategies to address these deficits  3) Pt does not  require a pneumatic compression pump at this time.   Patient requires skilled Physical Therapy services  for the following problem list and secondary functional limitations:    Problem List:   Lack of knowledge of lymphedema/edema self care  Uncontrolled swelling and/or lack of appropriate compression garment  Axillary web syndrome of involved quadrant   Decreased strength of involved extremity  Decreased endurance/fatigue    Secondary Functional Limitations:  Decreased knowledge of self care  of lymphedema/edema puts patient at risk for increased swelling and infection  Uncontrolled swelling can increase chances of  infection, and limit functional ROM   Axillary web syndrome can diminish ROM and is a  risk factor with lymphedema  Decreased strength can limit the patient's ADL's, work, and recreational activities   Decreased endurance/fatigue can limit patient's ability to perform ADL's, work, and recreational activities    Patient Goals: Decrease swelling, obtain a compression garment, decrease stiffness, Improve strength, return to recreational activities and return to work    Physical Therapy Goals:   In 1-3 months: Pt and/or caregiver will:  Be able to verbalize proper skin care, lymphedema risk reduction strategies, use and care of compression garments/devices MET 07-24-2020  Be able to demonstrate self MLD, donning/doffing garments to manage swelling MET 07-24-2020  Improve affected UE ROM  by 5-10 degrees to optimal functional range in ADL's( home, work, recreational and community) MET 07-24-2020  Improve affected UE strength by 1/2 muscle grade for optimal functioning for ADL's(home, work, recreational and community) MET 09-03-20  Improve endurance deficits demonstrated by improved test score on test TBD  MET 09-18-20    In 6-12 months: Pt and/or caregiver will:  Be independent with home management of self care related to lymphedema to reduce infection risk and recurrent hospitalizations MET 09-18-20  Retain an optimal limb volume that allows pt to fit into  appropriate compression garments and clothing to improve functional use of UE and QOL   Stabilize/reduce deterioration of affected UE ROM in optimal functional range in all ADL's by continuing to proved patient education and evaluation for assistive devices to ensure safety and effectiveness in shoulder mobility and function on an ongoing basis MET 09-18-20  Stabilize /reduce deterioration of affected UE strength in ADL's (home, work, recreational, and community)  Manage endurance/fatigue for patient to be able to participate in ADL's(home, work, recreational, and community) MET 09-18-20        PLAN:     Planned frequency and duration  of treatment:   1 x week x 6 weeks    Next Visit Plan: Prep for discharge to self care,  perform MLD,  UE stretching/strengthening,   Pt to continue with current plan of care to educate in self care, precautions, and management of symptoms, optimal edema reduction/maintenance of girth, soft tissue changes, fitting of appropriate compression garments, and progression of exercises to optimize functional ROM, strength, balance and endurance in all ADL's(home, work, recreational, community)    SUBJECTIVE:  Patient???s communication preference: verbal, written, visual, prn   History of Present Illness/ Pt reports:  Doing well with home and work life     Location of pain: No pain reported today  Notes some numbness and tingling in both feet       OBJECTIVE:      Posture/Observations:  WFL    Range of Motion/Flexibilty:   UE WFL   Axillary Web Syndrome location:  Mild right axilla     Strength/MMT:   UE WFL except right shoulder flexion and abduction grossly 4+/5  Grip strength testing reveals: right: 29/27.3/24.2=26.8 NORMAL, left: 24.1/24.9/23.8=24.2 NORMAL    LE: [facility]      Girth Measurements: taken in cm    Date: 1-12 1-12 1-27 1-27 3-22 3-22 4-4 4-4 4-12 4-12    Right Left RIght Left Right Left RIght Left RIght Left   MCP 17.5 17 17 17  17.9 17.5 16.5 16.5 17 17    Mid palm 18.7 18 19 17 19  18.5 17.5 17.5 17.5 18.5   Thumb DIP   5.9 6         1st finger DIP   4.6 4.4         1st finger PIP   5.8 5.5         2nd finger DIP   4.8 4.6         2nd finger PIP   5.8 5.6         3rd finger DIP   4.3 4.2         3rd finger PIP   5.5 5.6         4th finger DIP   4 3.9         4th finger PIP   4.6 4.5                      Axilla Mid breast nipple line             Ribs below breast             Natural Waistline             Umbilicus             Hips at  cm below umbilicus             Limb Volume  Location of swelling:  right axilla hand and fingers     Skin condition:  Stemmer???s sign:no.  dry and increased skin thickness  Incision/Scar:   incision healing well postoperatively  reduced scar mobility   scar adhesion   myofascial restrictions     Sensation :  Peripheral neuropathy:none  Light touch:     Decreased sensation under axilla, around scar, and posterior lateral upper arm   Proprioception:  not tested  Vibration:   not tested    Gait:  WNL  Balance: pt reports no balance issues    Endurance/Fatigue: pt reports less fatigue after return to work   DME needs related to safety:none      Total Treatment Time: 55 min    Treatment Rendered:    Patient and/or caregiver and therapist were mask compliant per current [place] COVID mask policy during the entire treatment session.        Manual x 35 min   Myofascial release/Spontaneous Muscle Release Techniques, STM to affected areas , Lymphatouch to affected areas with simultaneous MFR and MLD to appropriate anastomoses in order to decrease swelling.  Application of compression garment/devices prn to prevent re-accumulation of fluid.  20  min: Therapeutic exercise: pulley warmups,  green and red theraband, 1 set of 10: rows with squats, extension on one leg, ER, IR in tandem, diagonals, lawnmower,  Throwing ball chest press, overhead, side throws    Equipment provided/recommended:   N/A    Communication/consultation with other professionals:  N/A    Referrals made to the following providers:  none    Medical Necessity: This treatment is medically necessary throughout the course of this patient's life during and after cancer treatments to improve functional activities and /or to minimize the decline of functional abilities and worsening of symptoms,including infection and recurrent hospitalizations, through independent/home management strategies.  Lymphedema, a chronic, progressive condition for which there is no cure, is marked by the accumulation of protein-rich fluid in one or more quadrants of the body due to primary or secondary disruption of the lymphatic system.  The sustained accumulation results in tissue inflammation, an increase in fatty tissue, and development of obstructive connective tissue.  These changes may result in an increased risk of infection, disfigurement and a decrease in mobility and functional performance.   Pt will benefit from physical therapy by a certified lymphedema therapist to address : education in self care, precautions, and management of symptoms, optimal edema reduction/maintenance of girth, optimal soft tissue changes, fitting of appropriate compression garments, and progression of exercises to optimize functional ROM and strength, balance, and endurance in all ADL's(home, work, recreational, community).    I attest that I have reviewed the above information.  Signed: Clementeen Graham, PT   09/18/2020 5:53 PM

## 2020-09-20 NOTE — Unmapped (Signed)
[facility] Clinical Assessment & Refill Coordination Note    Tami Gibson, [place]: Oct 19, 1990  Phone: [phone] (home)     All above HIPAA information was verified with patient.     Was a Nurse, learning disability used for this call? No    Specialty Medication(s):   Hematology/Oncology: Verzenio 150mg      Current Outpatient Medications   Medication Sig Dispense Refill   ??? abemaciclib (VERZENIO) 150 mg Tab tablet Take 1 tablet (150 mg total) by mouth Two (2) times a day. Take with or without food. 56 tablet 5   ??? acetaminophen (TYLENOL) 325 MG tablet Take 650 mg by mouth every six (6) hours as needed for pain.     ??? calcium carbonate (TUMS) 200 mg calcium (500 mg) chewable tablet Chew 1 tablet daily.     ??? docusate sodium (COLACE) 100 MG capsule Take 100 mg by mouth. (Patient not taking: Reported on 08/08/2020)     ??? ergocalciferol-1,250 mcg, 50,000 unit, (VITAMIN D2-1,250 MCG, 50,000 UNIT,) 1,250 mcg (50,000 unit) capsule Take 1,250 mcg by mouth once a week.     ??? letrozole (FEMARA) 2.5 mg tablet Take 1 tablet (2.5 mg total) by mouth daily. 90 tablet 3   ??? mometasone (ELOCON) 0.1 % cream Apply to breast twice daily 90 g 1   ??? omeprazole (PRILOSEC) 20 MG capsule Take 1 capsule (20 mg total) by mouth daily. 90 capsule 3   ??? ondansetron (ZOFRAN) 4 MG tablet Take 1 tablet (4 mg total) by mouth every six (6) hours as needed for nausea. 30 tablet 1   ??? rimegepant (NURTEC ODT) 75 mg TbDL Take 75 mg by mouth.      ??? SUMAtriptan (IMITREX) 100 MG tablet TAKE ONE TABLET BY MOUTH AT ONSET OF MIGRAINE FOR UP TO 1 DOSE . IF SYMPTOMS PERSIST A SECOND DOSE MAY BE TAKEN IN 2 HOURS IF NEEDED     ??? topiramate (TOPAMAX) 50 MG tablet Take 50 mg by mouth Two (2) times a day.     ??? vitamin E-134 mg, 200 UNIT, 134 mg (200 UNIT) capsule Take 134 mg by mouth daily.       No current facility-administered medications for this visit.     Facility-Administered Medications Ordered in Other Visits   Medication Dose Route Frequency Provider Last Rate Last Admin   ??? leuprolide (LUPRON) 3.75 mg injection                 Changes to medications: Tami Gibson reports no changes at this time.    No Known Allergies    Changes to allergies: No    SPECIALTY MEDICATION ADHERENCE     Verzenio 150 mg: 5 days of medicine on hand       Medication Adherence    Patient reported X missed doses in the last month: 0  Specialty Medication: Verzenio 150mg   Patient is on additional specialty medications: No  Informant: patient  Confirmed plan for next specialty medication refill: delivery by pharmacy  Refills needed for supportive medications: not needed          Specialty medication(s) dose(s) confirmed: Regimen is correct and unchanged.     Are there any concerns with adherence? No    Adherence counseling provided? Not needed    CLINICAL MANAGEMENT AND INTERVENTION      Clinical Benefit Assessment:    Do you feel the medicine is effective or helping your condition? Yes    Clinical Benefit counseling provided? Not  needed    Adverse Effects Assessment:    Are you experiencing any side effects? Yes, patient reports experiencing diarrhea. Side effect counseling provided: takes Imodium to help manage    Are you experiencing difficulty administering your medicine? No    Quality of Life Assessment:    How many days over the past month did your condition  keep you from your normal activities? For example, brushing your teeth or getting up in the morning. Patient declined to answer    Have you discussed this with your provider? Not needed    Acute Infection Status:    Acute infections noted within Epic:  No active infections  Patient reported infection: None    Therapy Appropriateness:    Is therapy appropriate? Yes, therapy is appropriate and should be continued    DISEASE/MEDICATION-SPECIFIC INFORMATION      N/A    PATIENT SPECIFIC NEEDS     - Does the patient have any physical, cognitive, or cultural barriers? No    - Is the patient high risk? Yes, patient is taking oral chemotherapy. Appropriateness of therapy as been assessed    - Does the patient require a Care Management Plan? No     - Does the patient require physician intervention or other additional services (i.e. nutrition, smoking cessation, social work)? No      SHIPPING     Specialty Medication(s) to be Shipped:   Hematology/Oncology: Verzenio 150mg     Other medication(s) to be shipped: No additional medications requested for fill at this time     Changes to insurance: No    Delivery Scheduled: Yes, Expected medication delivery date: 09/21/20.     Medication will be delivered via Same Day Courier to the confirmed prescription address in [facility].    The patient will receive a drug information handout for each medication shipped and additional FDA Medication Guides as required.  Verified that patient has previously received a Conservation officer, historic buildings and a Surveyor, mining.    All of the patient's questions and concerns have been addressed.    Tami Gibson Tami Gibson   [facility] Specialty Pharmacist

## 2020-09-21 MED FILL — VERZENIO 150 MG TABLET: ORAL | 28 days supply | Qty: 56 | Fill #1

## 2020-09-26 ENCOUNTER — Ambulatory Visit: Admit: 2020-09-26 | Discharge: 2020-09-27 | Payer: PRIVATE HEALTH INSURANCE

## 2020-09-26 ENCOUNTER — Other Ambulatory Visit: Admit: 2020-09-26 | Discharge: 2020-09-27 | Payer: PRIVATE HEALTH INSURANCE

## 2020-09-26 DIAGNOSIS — Z170 Estrogen receptor positive status [ER+]: Principal | ICD-10-CM

## 2020-09-26 DIAGNOSIS — C50911 Malignant neoplasm of unspecified site of right female breast: Principal | ICD-10-CM

## 2020-09-26 LAB — CBC W/ AUTO DIFF
BASOPHILS ABSOLUTE COUNT: 0 10*9/L (ref 0.0–0.1)
BASOPHILS RELATIVE PERCENT: 0.6 %
EOSINOPHILS ABSOLUTE COUNT: 0 10*9/L (ref 0.0–0.5)
EOSINOPHILS RELATIVE PERCENT: 1.1 %
HEMATOCRIT: 36.9 % (ref 34.0–44.0)
HEMOGLOBIN: 12.3 g/dL (ref 11.3–14.9)
LYMPHOCYTES ABSOLUTE COUNT: 1.1 10*9/L (ref 1.1–3.6)
LYMPHOCYTES RELATIVE PERCENT: 33.4 %
MEAN CORPUSCULAR HEMOGLOBIN CONC: 33.4 g/dL (ref 32.0–36.0)
MEAN CORPUSCULAR HEMOGLOBIN: 24.6 pg — ABNORMAL LOW (ref 25.9–32.4)
MEAN CORPUSCULAR VOLUME: 73.7 fL — ABNORMAL LOW (ref 77.6–95.7)
MEAN PLATELET VOLUME: 7.2 fL (ref 6.8–10.7)
MONOCYTES ABSOLUTE COUNT: 0.2 10*9/L — ABNORMAL LOW (ref 0.3–0.8)
MONOCYTES RELATIVE PERCENT: 7.7 %
NEUTROPHILS ABSOLUTE COUNT: 1.8 10*9/L (ref 1.8–7.8)
NEUTROPHILS RELATIVE PERCENT: 57.2 %
PLATELET COUNT: 193 10*9/L (ref 150–450)
RED BLOOD CELL COUNT: 5 10*12/L (ref 3.95–5.13)
RED CELL DISTRIBUTION WIDTH: 21.1 % — ABNORMAL HIGH (ref 12.2–15.2)
WBC ADJUSTED: 3.2 10*9/L — ABNORMAL LOW (ref 3.6–11.2)

## 2020-09-26 LAB — BILIRUBIN, DIRECT: BILIRUBIN DIRECT: 0.1 mg/dL (ref 0.00–0.30)

## 2020-09-26 LAB — SLIDE REVIEW

## 2020-09-26 LAB — COMPREHENSIVE METABOLIC PANEL
ALBUMIN: 4.7 g/dL (ref 3.4–5.0)
ALKALINE PHOSPHATASE: 127 U/L — ABNORMAL HIGH (ref 46–116)
ALT (SGPT): 14 U/L (ref 10–49)
ANION GAP: 12 mmol/L (ref 5–14)
AST (SGOT): 21 U/L (ref <=34)
BILIRUBIN TOTAL: 0.4 mg/dL (ref 0.3–1.2)
BLOOD UREA NITROGEN: 18 mg/dL (ref 9–23)
BUN / CREAT RATIO: 16
CALCIUM: 10.4 mg/dL (ref 8.7–10.4)
CHLORIDE: 104 mmol/L (ref 98–107)
CO2: 22 mmol/L (ref 20.0–31.0)
CREATININE: 1.1 mg/dL — ABNORMAL HIGH
EGFR CKD-EPI AA FEMALE: 77 mL/min/{1.73_m2} (ref >=60)
EGFR CKD-EPI NON-AA FEMALE: 67 mL/min/{1.73_m2} (ref >=60)
GLUCOSE RANDOM: 81 mg/dL (ref 70–179)
POTASSIUM: 3.4 mmol/L (ref 3.4–4.8)
PROTEIN TOTAL: 8.7 g/dL — ABNORMAL HIGH (ref 5.7–8.2)
SODIUM: 138 mmol/L (ref 135–145)

## 2020-09-26 NOTE — Unmapped (Signed)
[facility] [place]  OUTPATIENT PHYSICAL THERAPY  09/26/2020  Note Type: Discharge Note       Patient Name: Tami Gibson  Date of Birth:November 05, 1990  Diagnosis:   Encounter Diagnoses   Name Primary?   ??? Lymphedema syndrome, postmastectomy Yes   ??? Scar condition and fibrosis of skin    ??? Malignant neoplasm of right breast in female, estrogen receptor positive, unspecified site of breast (CMS-HCC)      Referring MD:  Jobe Marker*     Date of Onset of Impairment12-06-2019  Date PT Care Plan Established or Reviewed-06-13-2020  Date PT Treatment Started1-05-2021  Plan of Care Effective Date:  06-13-2020    Session #:  14    Contraindications:  none  Precautions:  none  Red Flags:  history of cancer, right breast IB          ASSESSMENT/Progress toward goals:   Pt is progressing toward meeting goals, Patient's skin integrity is improving.  Skin is softening with treatment which allows for improved lymph uptake, and decreased risk of infection and Pt is demonstrating proficiency at self care of edema/lymphedema Pt has met goals of therapy and is ready for discharge.     Recommendations for treatment/garments:  1) Patient will obtain proper garments to address swelling and improve tissue integrity. Garment recommendations :RTW 20-30 mm Hg sleeve prn, [place] bra prn ( can wear sports bra for truncal swelling)  2) Pt will be seen clinically for rehabilitation to include treatments to address as needed: edema/lymphedema, pain, ROM, strength, balance and endurance deficits, as well as learning self care strategies to address these deficits  3) Pt does not  require a pneumatic compression pump at this time.   Patient requires skilled Physical Therapy services  for the following problem list and secondary functional limitations:    Problem List:   Lack of knowledge of lymphedema/edema self care  Uncontrolled swelling and/or lack of appropriate compression garment  Axillary web syndrome of involved quadrant Decreased strength of involved extremity  Decreased endurance/fatigue    Secondary Functional Limitations:  Decreased knowledge of self care  of lymphedema/edema puts patient at risk for increased swelling and infection  Uncontrolled swelling can increase chances of  infection, and limit functional ROM   Axillary web syndrome can diminish ROM and is a  risk factor with lymphedema  Decreased strength can limit the patient's ADL's, work, and recreational activities   Decreased endurance/fatigue can limit patient's ability to perform ADL's, work, and recreational activities    Patient Goals: Decrease swelling, obtain a compression garment, decrease stiffness, Improve strength, return to recreational activities and return to work    Physical Therapy Goals:   In 1-3 months: Pt and/or caregiver will:  Be able to verbalize proper skin care, lymphedema risk reduction strategies, use and care of compression garments/devices MET 07-24-2020  Be able to demonstrate self MLD, donning/doffing garments to manage swelling MET 07-24-2020  Improve affected UE ROM  by 5-10 degrees to optimal functional range in ADL's( home, work, recreational and community) MET 07-24-2020  Improve affected UE strength by 1/2 muscle grade for optimal functioning for ADL's(home, work, recreational and community) MET 09-03-20  Improve endurance deficits demonstrated by improved test score on test TBD  MET 09-18-20    In 6-12 months: Pt and/or caregiver will:  Be independent with home management of self care related to lymphedema to reduce infection risk and recurrent hospitalizations MET 09-18-20  Retain an optimal limb volume that allows pt to fit  into appropriate compression garments and clothing to improve functional use of UE and QOL MET 09-26-20  Stabilize/reduce deterioration of affected UE ROM in optimal functional range in all ADL's by continuing to proved patient education and evaluation for assistive devices to ensure safety and effectiveness in shoulder mobility and function on an ongoing basis MET 09-18-20  Stabilize /reduce deterioration of affected UE strength in ADL's (home, work, recreational, and community)MET 09-26-20  Manage endurance/fatigue for patient to be able to participate in ADL's(home, work, recreational, and community) MET 09-18-20        PLAN:   Discharge to self care     SUBJECTIVE:  Patient???s communication preference: verbal, written, visual, prn   History of Present Illness/ Pt reports:  Had some cording running through right breast, was able to massage and get husband to help release it    Location of pain: No pain reported today  Notes some numbness and tingling in both feet       OBJECTIVE:      Posture/Observations:  WFL    Range of Motion/Flexibilty:   UE WFL   Axillary Web Syndrome location:  Mild right axilla     Strength/MMT:   UE WFL except right shoulder flexion and abduction grossly 4+/5  Grip strength testing reveals: right: 29/27.3/24.2=26.8 NORMAL, left: 24.1/24.9/23.8=24.2 NORMAL    LE: [facility]      Girth Measurements: taken in cm    Date: 1-12 1-12 1-27 1-27 3-22 3-22 4-4 4-4 4-12 4-12    Right Left RIght Left Right Left RIght Left RIght Left   MCP 17.5 17 17 17  17.9 17.5 16.5 16.5 17 17    Mid palm 18.7 18 19 17 19  18.5 17.5 17.5 17.5 18.5   Thumb DIP   5.9 6         1st finger DIP   4.6 4.4         1st finger PIP   5.8 5.5         2nd finger DIP   4.8 4.6         2nd finger PIP   5.8 5.6         3rd finger DIP   4.3 4.2         3rd finger PIP   5.5 5.6         4th finger DIP   4 3.9         4th finger PIP   4.6 4.5                      Axilla             Mid breast nipple line             Ribs below breast             Natural Waistline             Umbilicus             Hips at  cm below umbilicus             Limb Volume  Affected Limb  Location: Right Arm  Arm Measurement 0 cm: 15  Arm Measurement 5 cm: 18  Arm Measurement 10 cm: 23.2  Arm Measurement 15 cm: 26.5  Arm Measurement 20 cm: 28.8  Arm Measurement 25 cm: 25  Arm Measurement 30 cm: 31  Arm Measurement 35 cm: 37  Arm Measurement 40 cm: 38.2  Arm Measurement  45 cm: 38  Unaffected Limb  Location: Left Arm  Arm #2 Measurement 0 cm: 15  Arm #2 Measurement 5 cm: 20  Arm #2 Measurement 10 cm: 24.5  Arm #2 Measurement 15 cm: 27  Arm #2 Measurement 20 cm: 27  Arm #2 Measurement 25 cm: 30  Arm #2 Measurement 30 cm: 36  Arm #2 Measurement 35 cm: 40.5  Arm #2 Measurement 40 cm: 40  Arm #2 Measurement 45 cm: 39  Arm Volume  Affected Arm Volume: 2698.02  Unaffected Arm Volume: 3068.27  Arm Volume Difference: -370.25  % Arm Difference: -12.07 %  Arm Difference from Last Visit: 294.58 %     RIght MCP: 17, left 16.3  Right mid palm 18, left 16      Location of swelling:  right axilla hand and fingers     Skin condition:  Stemmer???s sign:no.  WFL  Incision/Scar:   incision healing well postoperatively    Sensation :  Peripheral neuropathy:none  Light touch:     Decreased sensation under axilla, around scar, and posterior lateral upper arm   Proprioception:  not tested  Vibration:   not tested    Gait:  WNL  Balance: pt reports no balance issues    Endurance/Fatigue: pt reports less fatigue after return to work   DME needs related to safety:none      Total Treatment Time: 53 min    Treatment Rendered:    Patient and/or caregiver and therapist were mask compliant per current [place] COVID mask policy during the entire treatment session.        Manual x 33 min   Myofascial release/Spontaneous Muscle Release Techniques, STM to affected areas , Lymphatouch to affected areas with simultaneous MFR and MLD to appropriate anastomoses in order to decrease swelling.  Application of compression garment/devices prn to prevent re-accumulation of fluid.  20  min: Therapeutic exercise: pulley warmups,  green and red theraband, 1 set of 10: rows with squats, extension on one leg, ER, IR  30 second plank holds on toes and elbows, side planks on knees      Reviewed all self care during session, pt independent    Equipment provided/recommended:   N/A    Communication/consultation with other professionals:  N/A    Referrals made to the following providers:  none    Medical Necessity: This treatment is medically necessary throughout the course of this patient's life during and after cancer treatments to improve functional activities and /or to minimize the decline of functional abilities and worsening of symptoms,including infection and recurrent hospitalizations, through independent/home management strategies.  Lymphedema, a chronic, progressive condition for which there is no cure, is marked by the accumulation of protein-rich fluid in one or more quadrants of the body due to primary or secondary disruption of the lymphatic system.  The sustained accumulation results in tissue inflammation, an increase in fatty tissue, and development of obstructive connective tissue.  These changes may result in an increased risk of infection, disfigurement and a decrease in mobility and functional performance.   Pt will benefit from physical therapy by a certified lymphedema therapist to address : education in self care, precautions, and management of symptoms, optimal edema reduction/maintenance of girth, optimal soft tissue changes, fitting of appropriate compression garments, and progression of exercises to optimize functional ROM and strength, balance, and endurance in all ADL's(home, work, recreational, community).    I attest that I have reviewed the above information.  Signed: Clementeen Graham, PT  09/26/2020 10:37 AM

## 2020-09-26 NOTE — Unmapped (Signed)
Breast Oncology Return Patient Evaluation  Referring Physician: Durenda Hurt, Md  [address]  [address]  [place],  [place] [address].  PCP: Durenda Hurt, MD  Breast Med/Onc: Adonis Brook, MD    Cancer Team  Surgical Oncology: Jobe Marker, MD  Radiation Oncology: Rayetta Humphrey, MD  Medical Oncology: Adonis Brook, MD  Plastic Surgery: Levan Hurst, MD  Genetics: sent    Reason for Visit: A 30 y.o. female with breast cancer referred for consultation for recommendations concerning the management of breast cancer.    Assessment/Plan:   Cancer Staging  Malignant neoplasm of right breast in female, estrogen receptor positive (CMS-HCC)  Staging form: Breast, AJCC 8th Edition  - Clinical: Stage IB (cT1c, cN1, cM0, G1, ER+, PR+, HER2-) - Signed by [name], MD on 12/02/2019  - Pathologic stage from 04/20/2020: No Stage Recommended (ypT1c, pN1a, cM0, G2, ER+, PR+, HER2-) - Signed by [name], MD on 04/25/2020  ??  #Stage IB (cT1c, cN1, cM0, G1, ER+, PR+, HER2-)right breast cancer    Neoadjuvant chemotherapy  Completed ddAC followed by DDTaxol on 03/21/20    Locoregional therapy  -s/p Right mastectomy with SLN and prophylactic left mastectomy  -Pathology consistent with residual disease, MD Dareen Piano residual cancer burden class: RCB-III  With 2/3 positive LNs   - ALND completed on 05/07/2020-1/16 lymph nodes involved. Total 3 LNs involved by metastatic adenocarcinoma  -Completed adjuvant RT on 08/06/2020    Current therapy  Ovarian suppression (lupron) + Letrozole - SOT 08/09/2020  Adjuvant Abemaciclib - SOT 08/29/2020      Staging scans  -Patient had baseline staging scans which are negative for any metastatic disease  -Patient requesting to repeat her staging scans. She is having a really hard time moving on after her diagnosis and feels like repeating scans will help reassure her that theres is no cancer left in her body.   -Repeat scans on March 2022 showed no evidence of metastasis    #Syncope/Presyncope  -Patient has had multiple episodes of presyncope with tunnel vision and one episode of syncope with preceding tunnel vision and lightheadedness all occurring approximately 4 to 5 days after chemo infusions.  No positional component and she is trying to stay well-hydrated.    -Extensive work-up has been unrevealing, including labs, chest CTA, brain MRI, CT head.  TTE prior to start of chemotherapy showed structurally normal heart.  She is on both Zofran and Lexapro, both which can prolong QT, but recent EKGs reassuring with normal QT interval, no preexcitation.  2 week Zio-patch w/o evidence of any concerning arrhythmia  -Overall, presentation seems most consistent with vasovagal etiology, unlikely to be cardiogenic. No symptoms since she was done with [facility].  - Cardiology following - followup limited TTE unremarkable  -Resolved    # Bone health  - Baseline bone mineral density evaluation prior to starting an aromatase inhibitor was normal  -Repeat in March 2024  ??  ??  # Supportive care  - Psychosocial: Supportive mother and husband. Follows with [facility] and [facility]  -Heartburn: Daily PPI prescribed  -Hotflashes: Vit E as needed  -Lymphedema: Continues to wear her compression sleeve and follows with physical therapy.  -Diarrhea: Imodium as needed    DISPO:  --Fu in 8-10 weeks  --Continue lupron monthly with nurse visits  --Labs monthly  --Aimee Faso visit in 6 weeks    All of the patient's questions were answered to the best of my ability and to her apparent  satisfaction.    Adonis Brook, MD  Breast Medical Oncology    I spent at least 45 minutes with this patient more than 50% in counseling and care co-ordination. I spent an additional 30 minutes on pre- and post-visit activities.     This note with created with dictation software. Please forgive any transcription errors and notify writer if changes are required. -----------------------------------------------------------------------------------------------------------------------------------------------    HPI: Tami Gibson is a 30 y.o. female who is seen for routine follow-up of management of breast cancer.     Interval History:  --Patient presenting for followup, not accompanied  --Doing really well. Mild diarhhea which she plans to take imodium for. Otherwise tolerating Abema without any issues.  --Her mood is overall better and she is feeling more positive.         --Performance status=0    Breast Cancer History  Oncology History Overview Note   30 year old female with recently self palpated right breast lump. A targeted right breast ultrasound on 11/15/2019 showed an irregular mass with spiculated margins at the 7:00 position, 6 cm from the nipple, measuring 1.4 x 1.2 x 1.0 cm. There were 2 abnormal level 1 axillary lymph nodes measuring 1.4 x 1.3 cm and 1.2 x 0.9 cm. Additionally, there was a high level 1/level 2 axillary lymph node, measuring 2.1 x 1.3 cm with an effaced fatty hilum.      Malignant neoplasm of right breast in female, estrogen receptor positive (CMS-HCC)   11/09/2019 Initial Diagnosis    Malignant neoplasm of right breast in female, estrogen receptor positive (CMS-HCC)     11/15/2019 Biopsy    Invasive ductal carcinoma  - Nottingham combined histologic grade: 1  ER 91-100%, PR 91-100%, HER2 negative by IHC    Lymph node, right axilla, core biopsy  - Lymph node positive for metastatic carcinoma,      12/02/2019 -  Cancer Staged    Staging form: Breast, AJCC 8th Edition  - Clinical: Stage IB (cT1c, cN1, cM0, G1, ER+, PR+, HER2-) - Signed by [name], MD on 12/02/2019       12/09/2019 -  Chemotherapy    OP BREAST AC (Dose Dense) q2W X 4 CYCLES, THEN PACLITAXEL (DOSE DENSE) Q2W x 4 CYCLES  DOXOrubicin 60 mg/m2 IV on day 1, cyclophosphamide 600 mg/m2 IV on day 1, every 2 weeks for 4 cycles, then PACLitaxel 175 mg/m2 every 2 weeks for 4 cycles 04/20/2020 Surgery    Breast, right, simple mastectomy  -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 Extracapsular extension is identified      Breast, left, simple mastectomy  -Benign breast tissue with no atypia, in situ or invasive carcinoma identified     04/20/2020 -  Cancer Staged    Staging form: Breast, AJCC 8th Edition  - Pathologic stage from 04/20/2020: No Stage Recommended (ypT1c, pN1a, cM0, G2, ER+, PR+, HER2-) - Signed by [name], MD on 04/25/2020       05/07/2020 Surgery    Lymph nodes, right axillary, dissection  -One of sixteen lymph nodes involved by metastatic adenocarcinoma (1/16)  -Metastatic focus measures 3 mm in greatest dimension  -No extracapsular extension is identified     06/28/2020 - 08/06/2020 Radiation    The total radiation dose will be 5000 cGy at 200 cGy/fraction for a total of 25 fractions, treated once a  day     08/13/2020 Endocrine/Hormone Therapy    Letrozole 2.5mg  +Ovarian suppression (monthly lupron)     Malignant neoplasm of right breast in female, estrogen receptor positive (CMS-HCC)   05/07/2020 Surgery    Lymph nodes, right axillary, dissection  -One of sixteen lymph nodes involved by metastatic adenocarcinoma (1/16)  -Metastatic focus measures 3 mm in greatest dimension  -No extracapsular extension is identified     06/13/2020 -  Radiation    Radiation Therapy Treatment Details (Noted on 06/13/2020)  Site: Right Breast - Overlapping sites  Technique: 3D CRT  Goal: No goal specified  Planned Treatment Start Date: No planned start date specified     06/21/2020 -  Radiation    Radiation Therapy Treatment Details (Noted on 06/21/2020)  Site: Right Breast - Overlapping sites  Technique: 3D CRT  Goal: No goal specified  Planned Treatment Start Date: No planned start date specified         Past Medical History  Past Medical History:   Diagnosis Date   ??? Acne     started during teen yrs., Accutane 2008-2009 during college yrs   ??? Anemia     During teen years only, no anemia recently   ??? 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 Gestational hypertension 01/30/2017    end of pregnancy had hypertension, was induced   ??? Headache     migraine occasionally   ??? Hypertension     during week 37 week, pt induced and had C/S due to hypertension   ??? 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 Kidney stone    ??? Malignant neoplasm of right breast in female, 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/30/2019   ??? Pyelonephritis 09/2016    Kidney stones   ??? Urinary tract infection     Had 5-6 in Lifetime, esp high school and college age   ??? Varicella     during childhood   ??? Vitamin B 12 deficiency        Reproductive/GYN History  OB History   Gravida Para Term Preterm AB Living   2 2 2  0 0 2   SAB IAB Ectopic Molar Multiple Live Births   0 0 0 0 0 2      # Outcome Date GA Lbr Len/2nd Weight Sex Delivery Anes PTL Lv   2 Term 02/17/19 [redacted]w[redacted]d  3545 g (7 lb 13 oz) F CS-LTranv Spinal, EPI N LIV      Complications: Failure to Progress in First Stage      Name: Gillen,CARTER RAE      Apgar1: 8  Apgar5: 9   1 Term 01/30/17 [redacted]w[redacted]d  2875 g (6 lb 5.4 oz) F CS-LTranv Spinal, EPI N LIV      Complications: Hypertension, Breech birth      Name: Rightmyer,OAKLEY      Apgar1: 8  Apgar5: 9      Obstetric Comments   OB-History reviewed by Lucilla Lame RN on 02/22/2019.   Gardasil series completed   Last pap:  2017; NIL   No abn paps   No hx STI's       Surgical History  Past Surgical History:   Procedure Laterality Date   ??? BREAST BIOPSY Right     IDC   ??? CESAREAN SECTION  01/30/2017    breech   ??? CHEMOTHERAPY     ??? IR INSERT PORT AGE GREATER THAN  5 YRS  12/08/2019    IR INSERT PORT AGE GREATER THAN 5 YRS 12/08/2019 Jobe Gibbon, MD IMG VIR [facility]   ??? LUMBAR PUNCTURE DIAGNOSTIC [facility] HISTORICAL RESULT)  01/2015    to relieve ICP   ??? PR BX/REMV,LYMPH NODE,DEEP AXILL Right 04/20/2020    Procedure: BX/EXC LYMPH NODE; OPEN, DEEP AXILRY NODE;  Surgeon: Moss Mc, MD;  Location: MAIN OR [facility];  Service: Surgical Oncology Breast   ??? PR CESAREAN DELIVERY ONLY N/A 01/30/2017    Procedure: CESAREAN DELIVERY ONLY;  Surgeon: Asher Muir, MD;  Location: L&D C-SECTION OR SUITES [facility];  Service: Maternal-Fetal Medicine   ??? PR CESAREAN DELIVERY ONLY N/A 02/17/2019    Procedure: CESAREAN DELIVERY ONLY;  Surgeon: Lonny Prude, MD;  Location: L&D C-SECTION OR SUITES [facility];  Service: Maternal-Fetal Medicine   ??? PR IMPLNT BIO IMPLNT FOR SOFT TISSUE REINFORCEMENT Bilateral 04/20/2020    Procedure: IMPLANTATION BIOLOGIC IMPLANT(EG, ACELLULAR DERMAL MATRIX) FOR SOFT TISSUE REINFORCEMENT(EG, BREAST, TRUNK);  Surgeon: Arsenio Katz, MD;  Location: MAIN OR [facility];  Service: Plastics   ??? PR INTRAOPERATIVE SENTINEL LYMPH NODE ID W DYE INJECTION Right 04/20/2020    Procedure: INTRAOPERATIVE IDENTIFICATION SENTINEL LYMPH NODE(S) INCLUDE INJECTION NON-RADIOACTIVE DYE, WHEN PERFORMED;  Surgeon: Moss Mc, MD;  Location: MAIN OR [place];  Service: Surgical Oncology Breast   ??? PR INTRAOPERATIVE SENTINEL LYMPH NODE ID W DYE INJECTION Right 05/07/2020    Procedure: INTRAOPERATIVE IDENTIFICATION SENTINEL LYMPH NODE(S) INCLUDE INJECTION NON-RADIOACTIVE DYE, WHEN PERFORMED;  Surgeon: Moss Mc, MD;  Location: MAIN OR [place];  Service: Surgical Oncology Breast   ??? PR MASTECTOMY, SIMPLE, COMPLETE Bilateral 04/20/2020    Procedure: MASTECTOMY, SIMPLE, COMPLETE;  Surgeon: Moss Mc, MD;  Location: MAIN OR [place];  Service: Surgical Oncology Breast   ??? PR REMOVE ARMPITS LYMPH NODES COMPLT Right 05/07/2020    Procedure: AXILLARY LYMPHADENECTOMY; COMPLETE;  Surgeon: Moss Mc, MD;  Location: MAIN OR [facility];  Service: Surgical Oncology Breast   ??? PR TISSUE EXPANDER PLACEMENT BREAST RECONSTRUCTION Bilateral 04/20/2020    Procedure: TISSUE EXPANDER PLACEMENT IN BREAST RECONSTRUCTION, INCLUDING SUBSEQUENT EXPANSION(S);  Surgeon: Arsenio Katz, MD;  Location: MAIN OR [facility];  Service: Plastics   ??? TONSILECTOMY, ADENOIDECTOMY, BILATERAL MYRINGOTOMY AND TUBES     ??? WISDOM TOOTH EXTRACTION  2004       Medications    Current Outpatient Medications:   ???  abemaciclib (VERZENIO) 150 mg Tab tablet, Take 1 tablet (150 mg total) by mouth Two (2) times a day. Take with or without food., Disp: 56 tablet, Rfl: 5  ???  acetaminophen (TYLENOL) 325 MG tablet, Take 650 mg by mouth every six (6) hours as needed for pain., Disp: , Rfl:   ???  calcium carbonate (TUMS) 200 mg calcium (500 mg) chewable tablet, Chew 1 tablet daily., Disp: , Rfl:   ???  docusate sodium (COLACE) 100 MG capsule, Take 100 mg by mouth. , Disp: , Rfl:   ???  ergocalciferol-1,250 mcg, 50,000 unit, (VITAMIN D2-1,250 MCG, 50,000 UNIT,) 1,250 mcg (50,000 unit) capsule, Take 1,250 mcg by mouth once a week., Disp: , Rfl:   ???  letrozole (FEMARA) 2.5 mg tablet, Take 1 tablet (2.5 mg total) by mouth daily., Disp: 90 tablet, Rfl: 3  ???  mometasone (ELOCON) 0.1 % cream, Apply to breast twice daily, Disp: 90 g, Rfl: 1  ???  omeprazole (PRILOSEC) 20 MG capsule, Take 1 capsule (20 mg total) by mouth daily., Disp: 90 capsule,  Rfl: 3  ???  ondansetron (ZOFRAN) 4 MG tablet, Take 1 tablet (4 mg total) by mouth every six (6) hours as needed for nausea., Disp: 30 tablet, Rfl: 1  ???  rimegepant (NURTEC ODT) 75 mg TbDL, Take 75 mg by mouth. , Disp: , Rfl:   ???  SUMAtriptan (IMITREX) 100 MG tablet, TAKE ONE TABLET BY MOUTH AT ONSET OF MIGRAINE FOR UP TO 1 DOSE . IF SYMPTOMS PERSIST A SECOND DOSE MAY BE TAKEN IN 2 HOURS IF NEEDED, Disp: , Rfl:   ???  topiramate (TOPAMAX) 50 MG tablet, Take 50 mg by mouth Two (2) times a day., Disp: , Rfl:   ???  vitamin E-134 mg, 200 UNIT, 134 mg (200 UNIT) capsule, Take 134 mg by mouth daily., Disp: , Rfl:   No current facility-administered medications for this visit.    Facility-Administered Medications Ordered in Other Visits:   ???  leuprolide (LUPRON) 3.75 mg injection, , , ,     Allergies  No Known Allergies    Personal and Social History  Social History     Social History Narrative    PRECONCEPTUAL ASSESSMENT:        Blima is a 30 y.o. Caucasian female    Caffeine:denies use    Cats: no    Exercise: not active    Diet: balanced    Family hx of of birth defects, chromosomal abnormality, intellectual disability, developmental delay: no.            PARTNER HISTORY:     Domingo Dimes is a 13 y.o.  Caucasian female.    Occupation:  Immunologist; [employer]    He has fathered children:  no    He is healthy with no chronic illness    Family history of  birth defects, chromosomal abnormality, intellectual disability, or developmental delay: no.                Family History  Family History   Problem Relation Age of Onset   ??? Cancer Maternal Aunt 45        breast Ca   ??? Hypertension Father    ??? Other Father 31        Benign brain tumor   ??? Hypothyroidism Mother    ??? Cancer Paternal Aunt         Breast Ca dx in 68s   ??? ALS Maternal Aunt    ??? Ovarian cancer Maternal Grandmother 53   ??? No Known Problems Maternal Grandfather    ??? Cancer Paternal Grandmother         Unknown Cancer   ??? No Known Problems Paternal Grandfather    ??? No Known Problems Maternal Aunt    ??? No Known Problems Maternal Aunt    ??? Breast cancer Maternal Cousin         Dx in 47s   ??? No Known Problems Paternal Aunt    ??? No Known Problems Daughter    ??? No Known Problems Daughter    ??? No Known Problems Other    ??? BRCA 1/2 Neg Hx    ??? Colon cancer Neg Hx    ??? Endometrial cancer Neg Hx          Review of Systems: A 12-system review of systems was obtained including: Constitutional, Eyes, ENT, Cardiovascular, Respiratory, GI, GU, Musculoskeletal, Skin, Neurological, Psychiatric, Endocrine, Heme/Lymphatic, and Allergic/Immunologic systems. It is negative or non-contributory to the patient???s management except for the following: See Interval History. For  details see the Symptom Report Form (MIM#1170), which I have reviewed, signed and will be scanned into the record.    Physical Examination:  Vital Signs: BP 105/71  - Pulse 87  - Temp 36.4 ??C (97.6 ??F) (Oral)  - Resp 16  - Ht 170.2 cm (5' 7)  - Wt 98.5 kg (217 lb 3.2 oz)  - SpO2 99%  - BMI 34.02 kg/m??   General:  Healthy-appearing female in no acute distress..  Cardiovasc:  No heaves, regular, no additional sounds. No lower extremity edema.    Respiratory:  Chest clear to percussion and auscultation, unlabored  Gastrointestinal:  Soft, nontender, no hepatomegaly.   Musculoskeletal:  No bony pain or tenderness.   Skin and Subcutaneous Tissues: Clustered maculopapular rash over the anterior port site.  Psychiatric: Mood is normal.  No other symptoms.   Neuro:  Alert and oriented.  Gait and coordination normal  Upper Extremity Lymphedema: None.   Breasts: Bilateral mastectomies with expanders in place. Left breast with mild hyperpigmentation. No chest wall lesions.  Heme/Lymphatic/Immunologic: No supraclavicular or cervical adenopathy    I have personally reviewed the following diagnostic studies:    Breast imaging and pathology reviewed. See Results for details

## 2020-09-26 NOTE — Unmapped (Signed)
It was a pleasure to see you today in the Breast Medical Oncology Clinic.      For clinical concerns during working hours, please call [phone]. My nurse navigator is Dan Maker, RN.    For clinical trial questions please call the study coordinator.     For emergencies, evenings or weekends, please call [phone] and ask for the oncology fellow on call.  Reasons to call the emergency line may include:  - Fever of 100.5 or greater  - Nausea and/or vomiting not relieved with nausea medicine  - Diarrhea or constipation not relieved with bowel regimen  - Severe pain not relieved with usual pain regimen

## 2020-10-01 ENCOUNTER — Telehealth: Admit: 2020-10-01 | Discharge: 2020-10-02 | Payer: PRIVATE HEALTH INSURANCE | Attending: Clinical | Primary: Clinical

## 2020-10-01 ENCOUNTER — Institutional Professional Consult (permissible substitution): Admit: 2020-10-01 | Discharge: 2020-10-02 | Payer: PRIVATE HEALTH INSURANCE

## 2020-10-01 DIAGNOSIS — C50911 Malignant neoplasm of unspecified site of right female breast: Principal | ICD-10-CM

## 2020-10-01 DIAGNOSIS — Z170 Estrogen receptor positive status [ER+]: Principal | ICD-10-CM

## 2020-10-01 MED ADMIN — leuprolide (LUPRON) injection 3.75 mg: 3.75 mg | INTRAMUSCULAR | @ 13:00:00 | Stop: 2020-10-01

## 2020-10-01 NOTE — Unmapped (Signed)
Pt received lupron 3.75mg  injection in Left ventroGluteus. Tolerated it well. Applied guaze and band aid.

## 2020-10-02 NOTE — Unmapped (Signed)
[facility]  Comprehensive Cancer Support Program   Telehealth Encounter    *non billable encounter*      Encounter Description: This encounter was conducted via live, face-to-face video conference in the setting of State of Emergency due to COVID-19 Pandemic.     The patient reports they are currently: not at home. I spent 35 minutes on the real-time audio and video with the patient on the date of service. I spent an additional 15 minutes on pre- and post-visit activities on the date of service.     The patient was physically located in [place] or a state in which I am permitted to provide care. The patient and/or parent/guardian understood that s/he may incur co-pays and cost sharing, and agreed to the telemedicine visit. The visit was reasonable and appropriate under the circumstances given the patient's presentation at the time.    The patient and/or parent/guardian has been advised of the potential risks and limitations of this mode of treatment (including, but not limited to, the absence of in-person examination) and has agreed to be treated using telemedicine. The patient's/patient's family's questions regarding telemedicine have been answered.     If the visit was completed in an ambulatory setting, the patient and/or parent/guardian has also been advised to contact their provider???s office for worsening conditions, and seek emergency medical treatment and/or call 911 if the patient deems either necessary.      Assessment:  Tami Gibson is a 30 y.o. female with a history of breast cancer. Referred by Luetta Nutting, PT for support navigating survivorship and long term impact of treatment (lymphedema). She describes a normal and healthy emotional response to treatment and is interested in engaging in therapy as she transitions from active treatment to survivorship.     Risk Assessment:  A suicide and violence risk assessment was performed as part of this evaluation. There is no acute risk for suicide or violence at this time.  While future psychiatric events cannot be accurately predicted, the patient does not currently require acute inpatient psychiatric care and does not currently meet [facility] involuntary commitment criteria.       Plan:  Will continue to follow for supportive counseling.      Subjective:   Patient interviewed in a private place, accompanied by no one. She shared updates since our last visit including ongoing treatment plans. Reports that she is doing much better from an anxiety standpoint. Scans were clear and she feels that this news is helping her transition to survivorship with less worry. [place]. Has been focused on nutrition and moving her body in ways that feel good and empowering.  Overall reports that she is coping fairly well and hopeful that side effects from treatment won't be difficult. Provided supportive counseling, active listening, normalization, and psychoeducation. Pt engaged and open to meeting again.         Objective:    Mental Status Exam:  Speech/Language:    Normal rate, volume, tone, fluency   Mood:   euthymic   Thought process and Associations:   Logical, linear, clear, coherent, goal directed   Abnormal/psychotic thought content:     Denies SI, HI, self harm, delusions, obsessions, paranoid ideation, or ideas of reference   Perceptual disturbances:     Does not endorse auditory or visual hallucinations     Orientation:   Oriented to person, place, time, and general circumstances   Insight:  Intact   Judgment:    Intact   Impulse Control:   Intact     [name], LCSW  Comprehensive Cancer Support Program  Phone: [phone]  Pager: [phone]

## 2020-10-03 ENCOUNTER — Ambulatory Visit
Admit: 2020-10-03 | Discharge: 2020-10-04 | Payer: PRIVATE HEALTH INSURANCE | Attending: Physician Assistant | Primary: Physician Assistant

## 2020-10-03 NOTE — Unmapped (Signed)
Assessment     Tami Gibson is a 30 y.o. female presenting to [facility] for COVID testing.     Problem List Items Addressed This Visit     None      Visit Diagnoses     Contact with and (suspected) exposure to covid-19    -  Primary    Relevant Orders    COVID-19 PCR          Plan     If no testing performed, pt counseled on routine care for respiratory illness.  If testing performed, COVID sent.  Patient directed to Home given findings during today's visit.    Subjective     Tami Gibson is a 30 y.o. female who [facility] with complaints of the following:    Exposure History: In the last 21 days?     Have you traveled outside of [place]? No               Have you been in close contact with someone confirmed by a test to have COVID? (Close contact is within 6 feet for at least 10 minutes) No       Have you worked in a health care facility? Yes, Where: [facility], Occupation/ Job: Therapist, occupational      Lived or worked facility like a nursing home, group home, or assisted living?    No         Are you scheduled to have surgery or a procedure in the next 3 days? No               Are you scheduled to receive cancer chemotherapy within the next 7 days?    No     Have you ever been tested before for COVID-19 with a swab of your nose? 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 1   Chills (especially repeated shaking chills) Yes, how many days? 1   Severe fatigue (felt very tired) Yes, how many days? 1   Muscle aches Yes, how many days? 1   Runny nose No   Sore throat No   Loss of taste or smell Yes, how many days? 2   Cough (new onset or worsening of chronic cough) No   Shortness of breath No   Nausea or vomiting Yes, how many days? 1   Headache No   Abdominal Pain No   Diarrhea (3 or more loose stools in last 24 hours) Yes, how many days? 42 weeks, cancer meds      History/Medical Conditions (as stated by patient on intake form):    Do you have any of the following:   Asthma or emphysema or COPD No   Cystic Fibrosis No   Diabetes No   High Blood Pressure         Cardiovascular Disease No   Chronic Kidney Disease No   Chronic Liver Disease No   Chronic blood disorder like Sickle Cell Disease  No   Weak immune system due to disease or medication Yes   Neurologic condition that limits movement  No   Developmental delay -  Moderate to Severe  No   Recent (within past 2 weeks) or current Pregnancy No   Morbid Obesity (>100 pounds over ideal weight) No   Current Smoker No   Former Smoker No       Objective     Given above, testing performed: Yes    Testing Performed:  Test Specimen Type Sent to   COVID-19  NP Swab [employer]       Scribe's Attestation: [place], [place] obtained and performed the history, physical exam and medical decision making elements that were entered into the chart.  Signed by Sharol Harness, LPN serving as Scribe, on 10/03/2020 12:23 PM    The documentation recorded by the scribe accurately reflects the service I personally performed and the decisions made by me. Zakiya Sporrer F. Latanya Maudlin  Oct 03, 2020 12:44 PM

## 2020-10-05 MED ORDER — NIRMATRELVIR 150 MG X 2-RITONAVIR 100 MG TABLET (EUA)
ORAL_TABLET | 0 refills | 0 days | Status: CP
Start: 2020-10-05 — End: ?

## 2020-10-05 MED ORDER — TOPIRAMATE 25 MG TABLET
0 days
Start: 2020-10-05 — End: ?

## 2020-10-08 NOTE — Unmapped (Signed)
Nurse navigator called patient to relay that while on paxlovid and recovering from covid, she should hold her abema until two days after taking paxlovid. Patient was previously given instruction to cut abema pills in half and has been taking those partial doses. She is aware of the new plan, and will take paxlovid until Wednesday evening 5/11. She will resume abema Friday evening.

## 2020-10-15 NOTE — Unmapped (Signed)
[facility] Refill Coordination Note    Specialty Medication(s) to be Shipped:   Hematology/Oncology: Verzenio 150mg     Other medication(s) to be shipped: No additional medications requested for fill at this time     [name], DOB: [birth year]  Phone: [phone] (home)       All above HIPAA information was verified with patient.     Was a Nurse, learning disability used for this call? No    Completed refill call assessment today to schedule patient's medication shipment from the [facility] [phone]).  All relevant notes have been reviewed.     Specialty medication(s) and dose(s) confirmed: Regimen is correct and unchanged.   Changes to medications: Dejae reports no changes at this time.  Changes to insurance: No  New side effects reported not previously addressed with a pharmacist or physician: None reported  Questions for the pharmacist: No    Confirmed patient received a Conservation officer, historic buildings and a Surveyor, mining with first shipment. The patient will receive a drug information handout for each medication shipped and additional FDA Medication Guides as required.       DISEASE/MEDICATION-SPECIFIC INFORMATION        N/A    SPECIALTY MEDICATION ADHERENCE     Medication Adherence    Patient reported X missed doses in the last month: 4  Specialty Medication: Verzenio  Patient is on additional specialty medications: No              Were doses missed due to medication being on hold? Yes - patient had covid and doctor aware of missed doses.    verzenio 150 mg: 10 days of medicine on hand        REFERRAL TO PHARMACIST     Referral to the pharmacist: Not needed      [facility]     Shipping address confirmed in Epic.     Delivery Scheduled: Yes, Expected medication delivery date: 10/19/20.     Medication will be delivered via Next Day Courier to the prescription address in Epic WAM.    Unk Lightning   [facility] Specialty Technician

## 2020-10-18 MED FILL — VERZENIO 150 MG TABLET: ORAL | 28 days supply | Qty: 56 | Fill #2

## 2020-10-30 ENCOUNTER — Ambulatory Visit
Admit: 2020-10-30 | Discharge: 2020-11-09 | Payer: PRIVATE HEALTH INSURANCE | Attending: Rehabilitative and Restorative Service Providers" | Primary: Rehabilitative and Restorative Service Providers"

## 2020-10-30 NOTE — Unmapped (Signed)
[facility] THERAPY SERVICES [place]  OUTPATIENT PHYSICAL THERAPY  10/30/2020  Note Type: Evaluation  Note Date Range: 10-30-20 to 01-28-21    Patient Name: Tami Gibson  Date of Birth:May 25, 1991  Diagnosis: No diagnosis found.  Referring MD:  Jobe Marker*     Date of Onset of Impairment-10/20/2020  Date PT Care Plan Established or Reviewed-10/30/2020  Date PT Treatment Started-10/30/2020   Plan of Care Effective Date: 10-30-20 to 01-28-21      Session #:  1      Contraindications:  none  Precautions:  none  Red Flags:  history of cancer, right breast IB  Past Medical History:  Past Medical History:   Diagnosis Date   ??? Acne     started during teen yrs., Accutane 2008-2009 during college yrs   ??? Anemia     During teen years only, no anemia recently   ??? 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 Gestational hypertension 01/30/2017    end of pregnancy had hypertension, was induced   ??? Headache     migraine occasionally   ??? Hypertension     during week 37 week, pt induced and had C/S due to hypertension   ??? 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 Kidney stone    ??? Malignant neoplasm of right breast in female, 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/30/2019   ??? Pyelonephritis 09/2016    Kidney stones   ??? Urinary tract infection     Had 5-6 in Lifetime, esp high school and college age   ??? Varicella     during childhood   ??? Vitamin B 12 deficiency      Oncology History/Surgical History:  Oncology History Overview Note   29 year old female with recently self palpated right breast lump. A targeted right breast ultrasound on 11/15/2019 showed an irregular mass with spiculated margins at the 7:00 position, 6 cm from the nipple, measuring 1.4 x 1.2 x 1.0 cm. There were 2 abnormal level 1 axillary lymph nodes measuring 1.4 x 1.3 cm and 1.2 x 0.9 cm. Additionally, there was a high level 1/level 2 axillary lymph node, measuring 2.1 x 1.3 cm with an effaced fatty hilum. Malignant neoplasm of right breast in female, estrogen receptor positive (CMS-HCC)   11/09/2019 Initial Diagnosis    Malignant neoplasm of right breast in female, estrogen receptor positive (CMS-HCC)     11/15/2019 Biopsy    Invasive ductal carcinoma  - Nottingham combined histologic grade: 1  ER 91-100%, PR 91-100%, HER2 negative by IHC    Lymph node, right axilla, core biopsy  - Lymph node positive for metastatic carcinoma,      12/02/2019 -  Cancer Staged    Staging form: Breast, AJCC 8th Edition  - Clinical: Stage IB (cT1c, cN1, cM0, G1, ER+, PR+, HER2-) - Signed by [name], MD on 12/02/2019       12/09/2019 -  Chemotherapy    OP BREAST AC (Dose Dense) q2W X 4 CYCLES, THEN PACLITAXEL (DOSE DENSE) Q2W x 4 CYCLES  DOXOrubicin 60 mg/m2 IV on day 1, cyclophosphamide 600 mg/m2 IV on day 1, every 2 weeks for 4 cycles, then PACLitaxel 175 mg/m2 every 2 weeks for 4 cycles      04/20/2020 Surgery    Breast, right, simple mastectomy  -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 Extracapsular extension is identified      Breast, left, simple mastectomy  -Benign breast tissue with no atypia, in situ or invasive carcinoma identified     04/20/2020 -  Cancer Staged    Staging form: Breast, AJCC 8th Edition  - Pathologic stage from 04/20/2020: No Stage Recommended (ypT1c, pN1a, cM0, G2, ER+, PR+, HER2-) - Signed by [name], MD on 04/25/2020       05/07/2020 Surgery    Lymph nodes, right axillary, dissection  -One of sixteen lymph nodes involved by metastatic adenocarcinoma (1/16)  -Metastatic focus measures 3 mm in greatest dimension  -No extracapsular extension is identified     06/28/2020 - 08/06/2020 Radiation    The total radiation dose will be 5000 cGy at 200 cGy/fraction for a total of 25 fractions, treated once a day     08/13/2020 Endocrine/Hormone Therapy    Letrozole 2.5mg  +Ovarian suppression (monthly lupron)     Malignant neoplasm of right breast in female, estrogen receptor positive (CMS-HCC)   05/07/2020 Surgery    Lymph nodes, right axillary, dissection  -One of sixteen lymph nodes involved by metastatic adenocarcinoma (1/16)  -Metastatic focus measures 3 mm in greatest dimension  -No extracapsular extension is identified     06/13/2020 -  Radiation    Radiation Therapy Treatment Details (Noted on 06/13/2020)  Site: Right Breast - Overlapping sites  Technique: 3D CRT  Goal: No goal specified  Planned Treatment Start Date: No planned start date specified     06/21/2020 -  Radiation    Radiation Therapy Treatment Details (Noted on 06/21/2020)  Site: Right Breast - Overlapping sites  Technique: 3D CRT  Goal: No goal specified  Planned Treatment Start Date: No planned start date specified       Past Surgical History:   Procedure Laterality Date   ??? BREAST BIOPSY Right     IDC   ??? CESAREAN SECTION  01/30/2017    breech   ??? CHEMOTHERAPY     ??? IR INSERT PORT AGE GREATER THAN 5 YRS  12/08/2019    IR INSERT PORT AGE GREATER THAN 5 YRS 12/08/2019 Jobe Gibbon, MD IMG VIR [facility]   ??? LUMBAR PUNCTURE DIAGNOSTIC [facility] HISTORICAL RESULT)  01/2015    to relieve ICP   ??? PR BX/REMV,LYMPH NODE,DEEP AXILL Right 04/20/2020    Procedure: BX/EXC LYMPH NODE; OPEN, DEEP AXILRY NODE;  Surgeon: Moss Mc, MD;  Location: MAIN OR [facility];  Service: Surgical Oncology Breast   ??? PR CESAREAN DELIVERY ONLY N/A 01/30/2017    Procedure: CESAREAN DELIVERY ONLY;  Surgeon: Asher Muir, MD;  Location: L&D C-SECTION OR SUITES [facility];  Service: Maternal-Fetal Medicine   ??? PR CESAREAN DELIVERY ONLY N/A 02/17/2019    Procedure: CESAREAN DELIVERY ONLY;  Surgeon: Lonny Prude, MD;  Location: L&D C-SECTION OR SUITES [facility];  Service: Maternal-Fetal Medicine   ??? PR IMPLNT BIO IMPLNT FOR SOFT TISSUE REINFORCEMENT Bilateral 04/20/2020    Procedure: IMPLANTATION BIOLOGIC IMPLANT(EG, ACELLULAR DERMAL MATRIX) FOR SOFT TISSUE REINFORCEMENT(EG, BREAST, TRUNK);  Surgeon: Arsenio Katz, MD;  Location: MAIN OR [facility];  Service: Plastics   ??? PR INTRAOPERATIVE SENTINEL LYMPH NODE ID W DYE INJECTION Right 04/20/2020    Procedure: INTRAOPERATIVE IDENTIFICATION SENTINEL LYMPH NODE(S) INCLUDE INJECTION NON-RADIOACTIVE DYE, WHEN PERFORMED;  Surgeon: Moss Mc, MD;  Location: MAIN OR [place];  Service: Surgical Oncology Breast   ??? PR INTRAOPERATIVE SENTINEL LYMPH NODE  ID W DYE INJECTION Right 05/07/2020    Procedure: INTRAOPERATIVE IDENTIFICATION SENTINEL LYMPH NODE(S) INCLUDE INJECTION NON-RADIOACTIVE DYE, WHEN PERFORMED;  Surgeon: Moss Mc, MD;  Location: MAIN OR [facility];  Service: Surgical Oncology Breast   ??? PR MASTECTOMY, SIMPLE, COMPLETE Bilateral 04/20/2020    Procedure: MASTECTOMY, SIMPLE, COMPLETE;  Surgeon: Moss Mc, MD;  Location: MAIN OR [facility];  Service: Surgical Oncology Breast   ??? PR REMOVE ARMPITS LYMPH NODES COMPLT Right 05/07/2020    Procedure: AXILLARY LYMPHADENECTOMY; COMPLETE;  Surgeon: Moss Mc, MD;  Location: MAIN OR [place];  Service: Surgical Oncology Breast   ??? PR TISSUE EXPANDER PLACEMENT BREAST RECONSTRUCTION Bilateral 04/20/2020    Procedure: TISSUE EXPANDER PLACEMENT IN BREAST RECONSTRUCTION, INCLUDING SUBSEQUENT EXPANSION(S);  Surgeon: Arsenio Katz, MD;  Location: MAIN OR [facility];  Service: Plastics   ??? TONSILECTOMY, ADENOIDECTOMY, BILATERAL MYRINGOTOMY AND TUBES     ??? WISDOM TOOTH EXTRACTION  2004     Metastatic cancer:   no      ASSESSMENT/Reason for Referral:   30 y.o. 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  The weather was very hot, and she did not wear a sleeve that day and noted increased swelling in her arm and hand.  It has subsided some since then, but is most notable in the hand with some fibrosis noted on the dorsum of the hand.  She continues to have swelling in the right arm and has been using her compression bra and pad to address truncal swelling, which has also begun to show signs of fibrosis.   The patient would benefit from an advanced pump to address the skin changes that are now occurring in her trunk and arm.  .  In addition to lymphedema , the patient is experiencing these side effects from cancer treatments: Myofascial/scar restrictions post op  Currently the right limb is essentially the same size as  the left limb, however, there are significant skin changes, particularly in the hand.      Recommendations for treatment/garments:  1) Patient will obtain proper garments to address swelling and improve tissue integrity. Garment recommendations : Mediven Harmony CCL 2 sleeve and glove, Jovi pak night time compression  2) Pt will be seen clinically for rehabilitation to include treatments to address as needed: edema/lymphedema, pain, ROM, strength, balance and endurance deficits, as well as learning self care strategies to address these deficits  3) Pt will require a pneumatic compression pump because despite conservative treatment AO:[id number] garments or devices, exercise, elevation, skin care and self MLD,the patient exhibits the following symptoms:hyperplasia, hyperpigmentation, fibrosis, truncal/abdominal swelling, chest/axillary swelling and unable to control swelling    Patient requires skilled Physical Therapy services  for the following problem list and secondary functional limitations:    Problem List:   Uncontrolled swelling and/or lack of appropriate compression garment    Secondary Functional Limitations:  Uncontrolled swelling can increase chances of  infection, and limit functional ROM Patient Goals: Decrease swelling and use as many devices as needed for self care so that I get this under control    Physical Therapy Goals:   In 1-3 months: Pt and/or caregiver will:  Be able to demonstrate self MLD, donning/doffing garments to manage swelling  Decrease limb volume by 1-3 % for proper fit of clothing and to minimize infection risk    In 6-12 months: Pt and/or caregiver will:  Retain an optimal limb volume that allows pt to fit into appropriate compression garments and clothing to improve functional use of UE and QOL    Prognosis for goal achievement:   Good due to previous success.      PLAN:   Within a 90 day certification period, schedule permitting, decreasing frequency as able, patient will participate in the following as needed: Manual lymphatic drainage, compression bandaging or use of reduction kits, appropriate garment recommendations, skin care and therapeutic exercise, manual therapy, low level laser therapy, lymphatouch, taping, ROM and strength training, education on posture and body mechanics,pump/equipment recommendations, balance exercises,endurance exercises, and strategies to assist with sensation deficits and pain reduction.      Planned frequency and duration  of treatment:   1 x week x 10 weeks    Next Visit Plan: MLD, MFR, lymphatouch, taping to hand, garment check, pump check  Pt to continue with current plan of care to educate in self care, precautions, and management of symptoms, optimal edema reduction/maintenance of girth, soft tissue changes, fitting of appropriate compression garments, and progression of exercises to optimize functional ROM, strength, balance and endurance in all ADL's(home, work, recreational, community)    SUBJECTIVE:  Patient???s communication preference: verbal, written, visual, prn   History of Present Illness/ Pt reports:  Started swelling when I didn't wear my sleeve one day and it was hot out   Location of pain: No pain    Cognitive Changes? no   Barriers to learning:    none    Difficulty coping with diagnosis, body image/financial concerns?  Informed patient of CCSP, pt would like referral:no       Home environment:     Home: married, two children  Pt has caregiver available, capable and willing to help:yes   Social History     Tobacco Use   ??? Smoking status: Never Smoker   ??? Smokeless tobacco: Never Used   Substance Use Topics   ??? Alcohol use: Not Currently       Occupation/ Activities/Recreation:        Occupational History   ??? Occupation: Recreational therapist      Exercise:  Going to start up again, had to stop due to Covid, works out in a gym, going to [employer] and [employer]  Prior Functional Status:   Independent    Current Functional Limitations: The patient reports the following limitations based on verbal description and/or a FOTO/Quick DASH score of 72 noting these impairments of the upper extremity:  limitations in recreational activities  limitations in regular daily activities due to arm dysfunction    Sexual Dysfunction or Bowel or Bladder  GI changes : not assessed      History of  skin/cellulitis infections?: no  Hospitalizations due to infections?no    Previous Lymphedema Treatment Type:  MLD, day garment, kinesio taping and lymphatouch    Current management:   self MLD and day garment    OBJECTIVE:      Posture/Observations:  Forward head, rounded shoulders     Range of Motion/Flexibilty:   UE WFL   Axillary Web Syndrome location: no     Strength/MMT:   UE WFL        Girth Measurements: taken in cm    Date: 5-31 5-31  Right Left RIght Left Right Left RIght Left RIght Left   MCP 16.8 16.5           Mid palm 18 18           Thumb DIP 6            1st finger DIP 4.9            1st finger PIP 6            2nd finger DIP 4.9            2nd finger PIP 5.9            3rd finger DIP 4.5            3rd finger PIP 5.3            4th finger DIP 4.0            4th finger PIP 4.5                         Axilla             Mid breast nipple line             Ribs below breast             Natural Waistline             Umbilicus             Hips at  cm below umbilicus             Limb Volume  Involved arm: Right   Dominant arm: Right   Patient position: Seated    Circumferences (cm)     Segment (cm) Right  10/30/2020 Left  10/30/2020   0 15.5 16.0   5 18.5 18.5   10 22.0 22.1   15 26.5 25.0   20 27.5 26.0   25 25.0 26.0   30 33.6 33.0   35 35.0 36.5   40 35.5 36.0   45 36.0 36.0        Involved arm: Right   Dominant arm: Right   Patient position: Seated    Volumes (mL)     Segment (cm) Right  10/30/2020 Left  10/30/2020   0 - 5 115.29 118.60   5 - 10 163.56 164.40   10 - 15 234.65 220.95   15 - 20 290.09 258.76   20 - 25 274.38 268.97   25 - 30 344.03 347.89   30 - 35 468.18 480.88   35 - 40 494.41 522.86   40 - 45 508.53 515.66      Involved arm: Right   Dominant arm: Right   Patient position: Seated   Total volume: right 2893.12 mL 10/30/2020 11:47 AM   Total volume: left 2898.97 mL 10/30/2020 11:47 AM   L/R difference -5.85 mL -0.20 %               Location of swelling:  right hand , wrist, forearm, elbow, arm, shoulder, axilla and chest    Skin condition:  Stemmer???s sign:yes.  increased skin thickness and fibrosis  Incision/Scar:   incision healing well postoperatively    Sensation :  Peripheral neuropathy:no  Light touch:     Decreased sensation under axilla, around scar, and posterior lateral upper arm   Proprioception:  not tested  Vibration:   not tested    Gait:  WNL  Balance: WFL    Endurance/Fatigue: WFL  DME needs related to safety:none      Total Treatment Time: 55 min      PT Evaluation: 15 min  Low  4+ systems evaluated: integumentary, circulatory/lymphatic, neurological, musculoskeletal, functional mobility  Data from the patient???s history and examination indicates the presence of 1-2 personal factors and/or comorbidities that will impact the plan of care for the current problem, including depression and/or anxiety as this can effect compliance and participation in therapy cancer history Examination of body systems includes 1-2   body structures, functions, activity limitations and/or participation restrictions including lymphatic system and ROM. The assessed clinical presentation is evolving based on tissue changes. These factors result in the need for low clinical decision making.    Treatment Rendered:    Patient and/or caregiver and therapist were mask compliant per current [place] COVID mask policy during the entire treatment session.      Self Care x15 min   Patient Education:Pt and/or caregiver were educated regarding some or all of the following prn:  Lymphedema/edema etiology,Lymphedema/edema precautions, Indications/Contraindications to treatment, importance of therapy,treatment options and plan, self care management  to include skin care, self MLD, garment/equipment options and HEP, and community resources.  Patient and/or caregiver demonstrated and verbalized agreement and understanding. Given written information as needed.    Manual x 15 min   Taping and MLD to appropriate anastomoses in order to decrease swelling.  Application of compression garment/devices prn to prevent re-accumulation of fluid.      Equipment provided/recommended:   kinesio tape    Communication/consultation with other professionals:  Email to DME re: garment needs [employer] and Email to  compression pump rep re: pump needsFlexitouch    Referrals made to the following providers:  none    Medical Necessity: This treatment is medically necessary throughout the course of this patient's life during and after cancer treatments to improve functional activities and /or to minimize the decline of functional abilities and worsening of symptoms,including infection and recurrent hospitalizations, through independent/home management strategies.  Lymphedema, a chronic, progressive condition for which there is no cure, is marked by the accumulation of protein-rich fluid in one or more quadrants of the body due to primary or secondary disruption of the lymphatic system.  The sustained accumulation results in tissue inflammation, an increase in fatty tissue, and development of obstructive connective tissue.  These changes may result in an increased risk of infection, disfigurement and a decrease in mobility and functional performance.   Pt will benefit from physical therapy by a certified lymphedema therapist to address : education in self care, precautions, and management of symptoms, optimal edema reduction/maintenance of girth, optimal soft tissue changes, fitting of appropriate compression garments, and progression of exercises to optimize functional ROM and strength, balance, and endurance in all ADL's(home, work, recreational, community).  Attendance Policy:  I reviewed the no-show/attendance policy with the patient and caregiver(s). The patient/family is aware that they must call to cancel appointments more than 24 hours in advance. They are also aware that if they late cancel or no-show three times, we reserve the right to cancel their remaining appointments. This policy is in place to allow [place] to best serve the needs of our caseload.      I attest that I have reviewed the above information.  Signed: Clementeen Graham, PT   10/30/2020 1:29 PM

## 2020-10-31 ENCOUNTER — Other Ambulatory Visit: Admit: 2020-10-31 | Discharge: 2020-10-31 | Payer: PRIVATE HEALTH INSURANCE

## 2020-10-31 ENCOUNTER — Ambulatory Visit: Admit: 2020-10-31 | Discharge: 2020-11-01 | Payer: PRIVATE HEALTH INSURANCE | Attending: Clinical | Primary: Clinical

## 2020-10-31 ENCOUNTER — Institutional Professional Consult (permissible substitution): Admit: 2020-10-31 | Discharge: 2020-10-31 | Payer: PRIVATE HEALTH INSURANCE

## 2020-10-31 DIAGNOSIS — Z170 Estrogen receptor positive status [ER+]: Principal | ICD-10-CM

## 2020-10-31 DIAGNOSIS — C50911 Malignant neoplasm of unspecified site of right female breast: Principal | ICD-10-CM

## 2020-10-31 LAB — SLIDE REVIEW

## 2020-10-31 LAB — COMPREHENSIVE METABOLIC PANEL
ALBUMIN: 3.8 g/dL (ref 3.4–5.0)
ALKALINE PHOSPHATASE: 117 U/L — ABNORMAL HIGH (ref 46–116)
ALT (SGPT): 12 U/L (ref 10–49)
ANION GAP: 5 mmol/L (ref 5–14)
AST (SGOT): 16 U/L (ref <=34)
BILIRUBIN TOTAL: 0.5 mg/dL (ref 0.3–1.2)
BLOOD UREA NITROGEN: 16 mg/dL (ref 9–23)
BUN / CREAT RATIO: 16
CALCIUM: 9.9 mg/dL (ref 8.7–10.4)
CHLORIDE: 107 mmol/L (ref 98–107)
CO2: 24 mmol/L (ref 20.0–31.0)
CREATININE: 1.02 mg/dL — ABNORMAL HIGH
EGFR CKD-EPI (2021) FEMALE: 76 mL/min/{1.73_m2} (ref >=60)
GLUCOSE RANDOM: 82 mg/dL (ref 70–179)
POTASSIUM: 3.8 mmol/L (ref 3.4–4.8)
PROTEIN TOTAL: 7.3 g/dL (ref 5.7–8.2)
SODIUM: 136 mmol/L (ref 135–145)

## 2020-10-31 LAB — CBC W/ AUTO DIFF
BASOPHILS ABSOLUTE COUNT: 0 10*9/L (ref 0.0–0.1)
BASOPHILS RELATIVE PERCENT: 0.5 %
EOSINOPHILS ABSOLUTE COUNT: 0 10*9/L (ref 0.0–0.5)
EOSINOPHILS RELATIVE PERCENT: 0.7 %
HEMATOCRIT: 31.2 % — ABNORMAL LOW (ref 34.0–44.0)
HEMOGLOBIN: 10.6 g/dL — ABNORMAL LOW (ref 11.3–14.9)
LYMPHOCYTES ABSOLUTE COUNT: 0.6 10*9/L — ABNORMAL LOW (ref 1.1–3.6)
LYMPHOCYTES RELATIVE PERCENT: 17.9 %
MEAN CORPUSCULAR HEMOGLOBIN CONC: 34 g/dL (ref 32.0–36.0)
MEAN CORPUSCULAR HEMOGLOBIN: 26.6 pg (ref 25.9–32.4)
MEAN CORPUSCULAR VOLUME: 78.1 fL (ref 77.6–95.7)
MEAN PLATELET VOLUME: 7.1 fL (ref 6.8–10.7)
MONOCYTES ABSOLUTE COUNT: 0.3 10*9/L (ref 0.3–0.8)
MONOCYTES RELATIVE PERCENT: 9.9 %
NEUTROPHILS ABSOLUTE COUNT: 2.4 10*9/L (ref 1.8–7.8)
NEUTROPHILS RELATIVE PERCENT: 71 %
PLATELET COUNT: 154 10*9/L (ref 150–450)
RED BLOOD CELL COUNT: 4 10*12/L (ref 3.95–5.13)
RED CELL DISTRIBUTION WIDTH: 23.2 % — ABNORMAL HIGH (ref 12.2–15.2)
WBC ADJUSTED: 3.4 10*9/L — ABNORMAL LOW (ref 3.6–11.2)

## 2020-10-31 LAB — BILIRUBIN, DIRECT: BILIRUBIN DIRECT: 0.1 mg/dL (ref 0.00–0.30)

## 2020-10-31 LAB — VITAMIN B12: VITAMIN B-12: 277 pg/mL (ref 211–911)

## 2020-10-31 MED ORDER — ONDANSETRON 4 MG DISINTEGRATING TABLET
ORAL_TABLET | Freq: Three times a day (TID) | ORAL | 3 refills | 10 days | Status: CP | PRN
Start: 2020-10-31 — End: ?

## 2020-10-31 MED ORDER — ESCITALOPRAM 10 MG TABLET
ORAL_TABLET | Freq: Every day | ORAL | 5 refills | 30 days | Status: CP
Start: 2020-10-31 — End: 2021-10-31

## 2020-10-31 MED ADMIN — leuprolide (LUPRON) injection 3.75 mg: 3.75 mg | INTRAMUSCULAR | @ 14:00:00 | Stop: 2020-10-31

## 2020-10-31 NOTE — Unmapped (Signed)
Adolescent and Young Adult Survivorship Clinic: New Patient    Name: Tami Gibson  Age: 30 y.o.  Date: 11/02/2020    Primary Care Provider:  Durenda Hurt, MD    Referring Provider:  Dr. Adonis Brook    Reason for visit:   We are seeing Tami Gibson at the request of Dr. Adonis Brook to evaluate and provide recommendations for cancer survivorship care.    Assessment:  Tami Gibson is a 30 y.o. yo female with a history of Stage IB (cT1c, cN1, cM0, G1, ER+, PR+, HER2-) right breast cancer. She has been off active therapy (chemo, surgery, radiation) since 3/22 and is now receiving hormone-directed + CDK inhibitor. She presents for evaluation and recommendations for cancer survivorship care.     Today, I provided Tami Gibson with a survivorship care plan that includes both a summary of prior therapy, potential treatment-associated long-term effects, and recommendations for risk-based surveillance for these late effects. We also discussed the importance of healthy lifestyle behaviors and her unique health concerns due to prior treatment including lymphedema and transition to survivorship.    Plan Summary:  - Return to [facility] survivorship clinic in six months  - Continue ongoing care with breast oncologist Dr. Laney Pastor including management of lupron, letrozole and abemiciclib  - Labs: CBC w/ diff 10/31/20 w/ mild anemia likely r/t abemiciclib; cr 1.02 increased from baseline likely r/t abemiciclib; Tami Gibson has phone visit with PharmD [place] on 11/06/20; per my review of dose modification, none currently required  - Next echo 2026  - Next DEXA 2024  - Referral to NP Tami Gibson in Integrative Medicine.  - Continue lexapro and one-on-one therapy with LCSW Tami Gibson  - Tami Gibson provided [facility] with recommendations for survivorship connections  ??  Plan Detail:  Potential long-term adverse effects include:    Secondary Cancer (Doxorubicin, Cyclophosphamide, Radiation): - no concerning symptoms, CBC 10/31/20 shows mild anemia likely d/t abemiciclib (Tami Gibson has phone visit with PharmD Tami Gibson on 11/06/20; per my review of dose modification, none currently required)  - Annual survivorship evaluation including risk-based history, physical exam, CBC.   - Recommend annual skin check and use of SPF >=30 sunscreen    Early heart disease (Doxorubicin, Radiation): CAD, arrhythmias or cardiomyopathy  - EKG: baseline following completion of therapy (8/21: normal sinus rhythm, nonspecific T wave abnormality)  - Echocardiogram: following completion of therapy (10/21 WNL EF 55-60%) [and every 5 years (COG LTFU Guidelines)]; baseline study prior to treatment (7/21 - WNL EF 55%) - next due 2026    Attention should be given to CVD risk factors including the maintenance of a healthy weight (today's BMI is Body mass index is 34.91 kg/m??. which is classified as obese), participation in regular exercise, consumption of a healthy diet, limitation of alcohol consumption, and avoidance of tobacco use. Evaluation for dyslipidemia and diabetes should be performed by the patient's PCP as recommended.    - Initial discussion around maximizing heart health through diet (currently doing Optavia diet) and exercise (will continue to address; current focus on PT/lymphedema care).    Decreased fertility/premature menopause/sexual dysfunction (Cyclophosphamide): no further children desired. Lupron + condoms for contraception. Low libido but not currently interested in interventions given other priorities.  - Integrative Medicine referral for alternative management of hot flashes such as acupuncture    Bladder problems (Cyclophosphamide): no complaints; cr 1.02 on 10/31/20 increased from baseline likely d/t abemiciclib  - Tami Gibson has phone visit with PharmD Tami Gibson on 11/06/20; per my review of dose modification,  none currently required    Peripheral neuropathies (Paclitaxel): no complaints    Scarring of lung tissue (Radiation): no SOB/cough    Decreased bone density (letrozole, lupron): baseline DEXA 09/12/20 showed normal bone mineral density.  - Being managed by breast oncologist given ongoing aromatase inhibitor. On vitamin D/calcium supplementation. Next DEXA due 2024 per breast oncologist.    Cognitive difficulties: c/o issues with short term memory and attention. No intervention currently warranted as patient has tools to manage.    Mood and anxiety disorders: anxiety recently worsened by setbacks of worsened lymphedema, juggling work/appointments and frustrations around approval for compression garments  - LCSW Tami Gibson joined visit for assessment. Ongoing psychotherapy with Tami Gibson. Just restarted lexapro prescribed by PCP.  - Integrative Medicine referral for alternative management of mood    Tissue fibrosis/skin changes (Radiation): not addressed today. Has rad onc 6 month f/u 9/22.    Lymphedema (Surgery): recent flare. Seeing PT again and working on approval for supportive garments/apparatus.    Dental problems:  - recommend twice annual dental checks and cleanings    Health Maintenance:   - A standard cancer screening schedule should be followed for the patient including annual physical with routine screening (PAP smear, glucose, lipids, BP)    I personally spent 55 minutes face-to-face and non-face-to-face in the care of this patient, which includes all pre, intra, and post visit time on the date of service.     Mariana Kaufman, AGPCNP-BC  Nurse Practitioner  Adolescent and Young Adult Cancer Program  [facility]  11/04/2020      HPI:  Oncology History Overview Note   30 year old female with recently self palpated right breast lump. A targeted right breast ultrasound on 11/15/2019 showed an irregular mass with spiculated margins at the 7:00 position, 6 cm from the nipple, measuring 1.4 x 1.2 x 1.0 cm. There were 2 abnormal level 1 axillary lymph nodes measuring 1.4 x 1.3 cm and 1.2 x 0.9 cm. Additionally, there was a high level 1/level 2 axillary lymph node, measuring 2.1 x 1.3 cm with an effaced fatty hilum.      Malignant neoplasm of right breast in female, estrogen receptor positive (CMS-HCC)   11/09/2019 Initial Diagnosis    Malignant neoplasm of right breast in female, estrogen receptor positive (CMS-HCC)     11/15/2019 Biopsy    Invasive ductal carcinoma  - Nottingham combined histologic grade: 1  ER 91-100%, PR 91-100%, HER2 negative by IHC    Lymph node, right axilla, core biopsy  - Lymph node positive for metastatic carcinoma,      12/02/2019 -  Cancer Staged    Staging form: Breast, AJCC 8th Edition  - Clinical: Stage IB (cT1c, cN1, cM0, G1, ER+, PR+, HER2-) - Signed by [name], MD on 12/02/2019       12/09/2019 -  Chemotherapy    OP BREAST AC (Dose Dense) q2W X 4 CYCLES, THEN PACLITAXEL (DOSE DENSE) Q2W x 4 CYCLES  DOXOrubicin 60 mg/m2 IV on day 1, cyclophosphamide 600 mg/m2 IV on day 1, every 2 weeks for 4 cycles, then PACLitaxel 175 mg/m2 every 2 weeks for 4 cycles      04/20/2020 Surgery    Breast, right, simple mastectomy  -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 Extracapsular extension is identified      Breast, left, simple  mastectomy  -Benign breast tissue with no atypia, in situ or invasive carcinoma identified     04/20/2020 -  Cancer Staged    Staging form: Breast, AJCC 8th Edition  - Pathologic stage from 04/20/2020: No Stage Recommended (ypT1c, pN1a, cM0, G2, ER+, PR+, HER2-) - Signed by [name], MD on 04/25/2020       05/07/2020 Surgery    Lymph nodes, right axillary, dissection  -One of sixteen lymph nodes involved by metastatic adenocarcinoma (1/16)  -Metastatic focus measures 3 mm in greatest dimension  -No extracapsular extension is identified     06/28/2020 - 08/06/2020 Radiation    The total radiation dose will be 5000 cGy at 200 cGy/fraction for a total of 25 fractions, treated once a day     08/13/2020 Endocrine/Hormone Therapy    Letrozole 2.5mg  +Ovarian suppression (monthly lupron)     Malignant neoplasm of right breast in female, estrogen receptor positive (CMS-HCC)   05/07/2020 Surgery    Lymph nodes, right axillary, dissection  -One of sixteen lymph nodes involved by metastatic adenocarcinoma (1/16)  -Metastatic focus measures 3 mm in greatest dimension  -No extracapsular extension is identified     06/13/2020 -  Radiation    Radiation Therapy Treatment Details (Noted on 06/13/2020)  Site: Right Breast - Overlapping sites  Technique: 3D CRT  Goal: No goal specified  Planned Treatment Start Date: No planned start date specified     06/21/2020 -  Radiation    Radiation Therapy Treatment Details (Noted on 06/21/2020)  Site: Right Breast - Overlapping sites  Technique: 3D CRT  Goal: No goal specified  Planned Treatment Start Date: No planned start date specified       SUMMARY OF CANCER TREATMENT:  Diagnosis:  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+, HER2-)  Date of Diagnosis: 11/2019   Date off Therapy: chemo complete 10/21, surgery 11/21; 12/21, radiation 3/22; ovarian suppression (letrozole/lupron)/abemaciclib ongoing  Chemotherapy: ddAC-T (doxorubicin, cyclophosphamide, paclitaxel)    Total Anthracycline Dose:  240 mg/m2  Total Alkylator Dose: 2.4 g/m2    Radiation Therapy: 5,000 cGy right breast and regional nodes (undissected axilla, SCV, IMNs)  Surgery:  Bilateral skin-sparing mastectomy, right targeted axillary dissection (TAD), port removal (04/20/2020); right axillary lymph node dissection (05/07/2020)    Interval History:  This week has been hard. Her lymphedema and needing to reschedule 10 PT sessions feels like a setback. She's also frustrated with insurance not approving the nighttime lymphedema product. She's [place] this [place] with her [place]. That's her happy place.    She was feeling pretty anxious and contacted her PCP to restart lexapro, which she did a couple of days ago. She is also happy to be seeing Tami Gibson for therapy. It was really nice to meet her this week in person.    She generally was feeling better after surgery but then COVID hit and that was another setback. Paxlovid made everything taste like metal. That's still happening sometimes as is random nausea and bouts of not wanting to eat. She had saltines one night for dinner.    She had decided to do Optavia to try to lose some of the weight that she put on with treatment. It comes with a coach and mailed food focused on clean eating. She still has it and plans to get more focused on it.    She is finished having kids and using condoms for protection.    She used to be able  to juggle a lot of information - for instance, two people telling her things at once - and she could remember them. Now she needs to listen to one thing only and write it down. That's frustrating for her.    Denies bone pain, SOB, cough, headache, diarrhea/constipation. Managing her hot flashes. Able to sleep at night with fans and throwing off the covers.     ROS:  Other than as noted in the interval history, the other systems reviewed were negative.    Past Medical, Surgical, Social and Family History were reviewed and pertinent updates were made in the Electronic Medical Record    MEDS:    Current Outpatient Medications on File Prior to Encounter   Medication Sig   ??? abemaciclib (VERZENIO) 150 mg Tab tablet Take 1 tablet (150 mg total) by mouth Two (2) times a day. Take with or without food.   ??? acetaminophen (TYLENOL) 325 MG tablet Take 650 mg by mouth every six (6) hours as needed for pain.   ??? calcium carbonate (TUMS) 200 mg calcium (500 mg) chewable tablet Chew 1 tablet daily.   ??? docusate sodium (COLACE) 100 MG capsule Take 100 mg by mouth.    ??? ergocalciferol-1,250 mcg, 50,000 unit, (VITAMIN D2-1,250 MCG, 50,000 UNIT,) 1,250 mcg (50,000 unit) capsule Take 1,250 mcg by mouth once a week.   ??? escitalopram oxalate (LEXAPRO) 10 MG tablet Take 1 tablet (10 mg total) by mouth in the morning.   ??? letrozole (FEMARA) 2.5 mg tablet Take 1 tablet (2.5 mg total) by mouth daily.   ??? mometasone (ELOCON) 0.1 % cream Apply to breast twice daily   ??? omeprazole (PRILOSEC) 20 MG capsule Take 1 capsule (20 mg total) by mouth daily.   ??? ondansetron (ZOFRAN-ODT) 4 MG disintegrating tablet Take 1 tablet (4 mg total) by mouth every eight (8) hours as needed for nausea.   ??? PAXLOVID CO-PACK, EUA, (NIRMATRELVIR-RITONAVIR) 150 mg x 2- 100 mg tablet See package instructions.   ??? rimegepant (NURTEC ODT) 75 mg TbDL Take 75 mg by mouth.    ??? SUMAtriptan (IMITREX) 100 MG tablet TAKE ONE TABLET BY MOUTH AT ONSET OF MIGRAINE FOR UP TO 1 DOSE . IF SYMPTOMS PERSIST A SECOND DOSE MAY BE TAKEN IN 2 HOURS IF NEEDED   ??? topiramate (TOPAMAX) 50 MG tablet Take 50 mg by mouth Two (2) times a day.   ??? vitamin E-134 mg, 200 UNIT, 134 mg (200 UNIT) capsule Take 134 mg by mouth daily.     Current Facility-Administered Medications on File Prior to Encounter   Medication   ??? leuprolide (LUPRON) 3.75 mg injection     ALLERGIES:  No Known Allergies    Physical Exam:  Vitals:    11/02/20 0855   BP: 103/74   Pulse: 87   Resp: 18   Temp: 36.3 ??C (97.4 ??F)   TempSrc: Temporal   SpO2: 98%   Height: 168 cm (5' 6.14)     GENERAL: alert and interactive, appropriately responsive, NAD  HEENT: sclerae clear, neck supple  PULM: normal effort and rate, clear in all fields, no wheezes, rales or rhonchi  CV: s1s2, RRR, no MRGs  ABD: soft, nontender, no organomegaly or masses, normoactive bowel sounds  EXT: right arm in compression sleeve, otherwise, no swelling or edema  M/S: no joint swelling, no effusions  NEURO: normal mental status, CN intact, normal gait and cerebellar function.      Labs:    No visits with results  within 1 Day(s) from this visit.   Latest known visit with results is:   Lab on 10/31/2020   Component Date Value Ref Range Status   ??? Sodium 10/31/2020 136  135 - 145 mmol/L Final   ??? Potassium 10/31/2020 3.8  3.4 - 4.8 mmol/L Final   ??? Chloride 10/31/2020 107  98 - 107 mmol/L Final   ??? CO2 10/31/2020 24.0  20.0 - 31.0 mmol/L Final   ??? Anion Gap 10/31/2020 5  5 - 14 mmol/L Final   ??? BUN 10/31/2020 16  9 - 23 mg/dL Final   ??? Creatinine 10/31/2020 1.02 (A) 0.60 - 0.80 mg/dL Final   ??? BUN/Creatinine Ratio 10/31/2020 16   Final   ??? eGFR CKD-EPI (2021) Female 10/31/2020 76  >=60 mL/min/1.53m2 Final    eGFR calculated with CKD-EPI 2021 equation in accordance with [employer] and [employer] of Nephrology Task Force recommendations.   ??? Glucose 10/31/2020 82  70 - 179 mg/dL Final   ??? Calcium 16/03/9603 9.9  8.7 - 10.4 mg/dL Final   ??? Albumin 54/01/8118 3.8  3.4 - 5.0 g/dL Final   ??? Total Protein 10/31/2020 7.3  5.7 - 8.2 g/dL Final   ??? Total Bilirubin 10/31/2020 0.5  0.3 - 1.2 mg/dL Final   ??? AST 14/78/2956 16  <=34 U/L Final   ??? ALT 10/31/2020 12  10 - 49 U/L Final   ??? Alkaline Phosphatase 10/31/2020 117 (A) 46 - 116 U/L Final   ??? WBC 10/31/2020 3.4 (A) 3.6 - 11.2 10*9/L Final   ??? RBC 10/31/2020 4.00  3.95 - 5.13 10*12/L Final   ??? HGB 10/31/2020 10.6 (A) 11.3 - 14.9 g/dL Final   ??? HCT 21/30/8657 31.2 (A) 34.0 - 44.0 % Final   ??? MCV 10/31/2020 78.1  77.6 - 95.7 fL Final   ??? MCH 10/31/2020 26.6  25.9 - 32.4 pg Final   ??? MCHC 10/31/2020 34.0  32.0 - 36.0 g/dL Final   ??? RDW 84/69/6295 23.2 (A) 12.2 - 15.2 % Final   ??? MPV 10/31/2020 7.1  6.8 - 10.7 fL Final   ??? Platelet 10/31/2020 154  150 - 450 10*9/L Final   ??? Neutrophils % 10/31/2020 71.0  % Final   ??? Lymphocytes % 10/31/2020 17.9  % Final   ??? Monocytes % 10/31/2020 9.9  % Final   ??? Eosinophils % 10/31/2020 0.7  % Final   ??? Basophils % 10/31/2020 0.5  % Final   ??? Absolute Neutrophils 10/31/2020 2.4  1.8 - 7.8 10*9/L Final   ??? Absolute Lymphocytes 10/31/2020 0.6 (A) 1.1 - 3.6 10*9/L Final   ??? Absolute Monocytes 10/31/2020 0.3  0.3 - 0.8 10*9/L Final   ??? Absolute Eosinophils 10/31/2020 0.0  0.0 - 0.5 10*9/L Final ??? Absolute Basophils 10/31/2020 0.0  0.0 - 0.1 10*9/L Final   ??? Microcytosis 10/31/2020 Slight (A) Not Present Final   ??? Anisocytosis 10/31/2020 Marked (A) Not Present Final   ??? Hypochromasia 10/31/2020 Slight (A) Not Present Final   ??? Bilirubin, Direct 10/31/2020 0.10  0.00 - 0.30 mg/dL Final   ??? Smear Review Comments 10/31/2020 See Comment (A) Undefined Final    Slide reviewed.      ??? Burr Cells 10/31/2020 Present (A) Not Present Final   ??? Vitamin B-12 10/31/2020 277  211 - 911 pg/ml Final

## 2020-10-31 NOTE — Unmapped (Signed)
Pt received lupron 3.75mg  injection in right ventroGluteus. Tolerated it well. Applied guaze and band aid.

## 2020-11-01 DIAGNOSIS — T451X5A Adverse effect of antineoplastic and immunosuppressive drugs, initial encounter: Principal | ICD-10-CM

## 2020-11-01 DIAGNOSIS — R112 Nausea with vomiting, unspecified: Principal | ICD-10-CM

## 2020-11-01 NOTE — Unmapped (Signed)
Lexapro restarted per mychart conversation.

## 2020-11-02 ENCOUNTER — Ambulatory Visit: Admit: 2020-11-02 | Discharge: 2020-11-03 | Payer: PRIVATE HEALTH INSURANCE

## 2020-11-02 NOTE — Unmapped (Signed)
[facility]  Comprehensive Cancer Support Program       *non billable encounter*      Assessment:  Tami Gibson is a 30 y.o. female with a history of breast cancer. Referred by Luetta Nutting, PT for support navigating survivorship and long term impact of treatment (lymphedema). She describes a normal and healthy emotional response to treatment and is interested in engaging in therapy as she transitions from active treatment to survivorship.     Risk Assessment:  A suicide and violence risk assessment was performed as part of this evaluation. There is no acute risk for suicide or violence at this time.  While future psychiatric events cannot be accurately predicted, the patient does not currently require acute inpatient psychiatric care and does not currently meet [facility] involuntary commitment criteria.       Plan:  Will continue to follow for supportive counseling, pt also exploring community based therapist via her PCP.       Subjective:   Patient interviewed in a private place, accompanied by no one. She shared updates since our last visit experiencing increased lymphedema [place]. Feeling frustrated that side effects from treatment continue to impact her day to day, ability to participate in family activities. Pt and her husband also had covid recently, which added stress, and brought back acute anxiety from the beginning of the pandemic. She has decided that she would like to restart Lexapro and has reached out her PCP about this. Pt also has survivorship clinic appt scheduled for this Friday, which she is looking forward to. Provided supportive counseling, active listening, normalization, and psychoeducation. Pt engaged and open to meeting again.         Objective:    Mental Status Exam:  Speech/Language:    Normal rate, volume, tone, fluency   Mood:   anxious   Thought process and Associations:   Logical, linear, clear, coherent, goal directed Abnormal/psychotic thought content:     Denies SI, HI, self harm, delusions, obsessions, paranoid ideation, or ideas of reference   Perceptual disturbances:     Does not endorse auditory or visual hallucinations     Orientation:   Oriented to person, place, time, and general circumstances   Insight:     Intact   Judgment:    Intact   Impulse Control:   Intact     [name], LCSW  Comprehensive Cancer Support Program  Phone: [phone]  Pager: [phone]

## 2020-11-02 NOTE — Unmapped (Signed)
Adolescent and Young Adult Cancer Survivorship Clinic   Psychosocial Assessment & Plan       Date Seen: 11/02/2020    Seen By: Mariana Kaufman, NP and Troy Sine, LCSW    Patient Concerns brought to our visit today:   Lymphedema issues  Adjustment to survivorship      Survivorship Domains:    Domain Concerns Intervention/Plan    Mental Health  Increased anxiety and re-started SSRI prescribed by PCP  Sees [name], LCSW for therapy and appreciates this support  None   Sleep  None None   Exercise/health behaviors None None    Fertility Status  Continues with lupron   No interest in any additional children None   Sexual Health  Not sex drive and low libido but dealing with this is not currently a priority  None   Relationships None None   Finances/Insurance Dealing with insurance issues and coverage for compression garments Explored resubmitting insurance claim with increased documentation   Send resources for financial assistance with compression garments    School/Work Hard to balance work with additional medical appointments - we explored possibly shifting work hours and other outside the box options None   Cognitive functioning Trouble with short term memory and attention to due multiple drivers  Has tools to cope with this (ex: lists, not multitasking) None   Connection with other survivors Very interested in peer connection and support MyChart message with resources       Tami Gibson is appropriately struggling with the adjustment to cancer survivorship.  She is still receiving a lot of medical care and needing to re-engage in PT for her lymphedema.  She continues to get support from a variety of sources, is extremely self aware and is working on connecting with appropriate referrals for additional services.     Referrals Made:   Peer support resources  Integrative medicine     Follow Up Needed:   Available for follow up as needed

## 2020-11-02 NOTE — Unmapped (Addendum)
It was so great to see you!    We'll refer you to NP Zada Finders in Integrative Medicine.    Think about, talk with your husband and Okey Dupre about, maybe taking some time off or reducing your hours.    We can send you some info on trips with other folks that have dealt with cancer.    Also here are some options for virtual hang-outs: [email or web address]    I'll send you a pdf copy of your survivorship care plan.    We can talk more about maximizing your heart's health down the road.    We'll schedule again in 6 months.

## 2020-11-05 NOTE — Unmapped (Signed)
Encounter addended by: Thyra Breed, NP on: 11/05/2020 9:41 AM   Actions taken: Level of Service modified

## 2020-11-05 NOTE — Unmapped (Signed)
Thalia Party with [employer] contacted the Communication Center requesting to speak with the care team of [facility] to discuss:    Checking on status of prescription for compression device faxed 11/01/20    Please contact Ladona Ridgel at [phone].    Thank you,   Kelli Hope  [facility]   [phone]

## 2020-11-06 ENCOUNTER — Institutional Professional Consult (permissible substitution)
Admit: 2020-11-06 | Discharge: 2020-11-07 | Payer: PRIVATE HEALTH INSURANCE | Attending: Pharmacist | Primary: Pharmacist

## 2020-11-06 NOTE — Unmapped (Signed)
Pharmacist Phone Follow-Up    Cancer Team  Medical Oncology: Dr. 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  She started adjuvant endocrine therapy with leuprolide depot and letrozole and then started abemaciclib on 3/30 per the [facility] trial.    Tami Gibson was experiencing diarrhea approximately two times daily and controlling it with Imodium.  She was also having nausea that was being controlled with ondansetron ODT.  She was then diagnosed with Covid and started Paxlovid on 10/05/20.  She held abemaciclib while on Paxlovid and restarted the medication after she completed the anti-viral therapy.  She is now having severe abdominal pain and very watery stools, especially after eating.    Prior to Covid, she did not have abdominal pain and very watery stools.  It is unclear if this a post-Covid effect, a worsening of the side effects of abemaciclib, or both.  I recommended to hold abemaciclib x 2 weeks to see if her symptoms improve.     Plan:  1. Hold abemaciclib 150 mg BID x 2 weeks  2. CPP follow-up 11/20/20  ________________________________________________________________________     Interval History   Tami Gibson is a 30 y.o. female with ER+/PR+/HER2- breast cancer who I am calling to follow-up on tolerance to abemaciclib. Tami Gibson was diagnosed with Covid early May and was prescribed Paxlovid on 5/6.  She held abemaciclib while on Paxlovid.  Prior to getting Covid, she would have loose stools approximately twice a day.  She did not have pain and the diarrhea was tolerable.  After Covid, she now has severe abdominal pain and then has pure liquid stools.  She has noticed a slight improvement this week but last night she had severe abdominal pain and then was up for several hours with liquid stools.  She describes the abdominal pain as intense.  She has intermittent nausea and ondansetron ODT is effective for her.    Due to her severe abdominal pain, I advised Tami Gibson to hold abemaciclib for 2 weeks.  Since she was not experiencing this prior to Covid, I am hoping some additional time to recover will help.    Oral chemotherapy:  Abemaciclib 150 mg BID  Start date: 08/29/20  Pharmacy: [facility]     Adverse Effects:  1. Abdominal pain/watery diarrhea - intense pain is new since having Covid; diarrhea is worse post-Covid; recommended holding abemaciclib and she may use Imodium prn  2. Nausea - intermittent; uses ondansetron ODT    Drug interactions: None identified    Medication list reviewed and updated in Epic.    Patient verbalized understanding of the above information.     I spent 30 minutes on the phone with the patient on the date of service. I spent an additional 15 minutes on pre- and post-visit activities.     The patient was physically located in [place] or a state in which I am permitted to provide care. The patient and/or parent/guardian understood that s/he may incur co-pays and cost sharing, and agreed to the telemedicine visit. The visit was reasonable and appropriate under the circumstances given the patient's presentation at the time.    The patient and/or parent/guardian has been advised of the potential risks and limitations of this  mode of treatment (including, but not limited to, the absence of in-person examination) and has agreed to be treated using telemedicine. The patient's/patient's family's questions regarding telemedicine have been answered.     If the visit was completed in an ambulatory setting, the patient and/or parent/guardian has also been advised to contact their provider???s office for worsening conditions, and seek emergency medical treatment and/or call 911 if the patient deems either necessary.        Oncology History Overview Note   30 year old female with recently self palpated right breast lump. A targeted right breast ultrasound on 11/15/2019 showed an irregular mass with spiculated margins at the 7:00 position, 6 cm from the nipple, measuring 1.4 x 1.2 x 1.0 cm. There were 2 abnormal level 1 axillary lymph nodes measuring 1.4 x 1.3 cm and 1.2 x 0.9 cm. Additionally, there was a high level 1/level 2 axillary lymph node, measuring 2.1 x 1.3 cm with an effaced fatty hilum.      Malignant neoplasm of right breast in female, estrogen receptor positive (CMS-HCC)   11/09/2019 Initial Diagnosis    Malignant neoplasm of right breast in female, estrogen receptor positive (CMS-HCC)     11/15/2019 Biopsy    Invasive ductal carcinoma  - Nottingham combined histologic grade: 1  ER 91-100%, PR 91-100%, HER2 negative by IHC    Lymph node, right axilla, core biopsy  - Lymph node positive for metastatic carcinoma,      12/02/2019 -  Cancer Staged    Staging form: Breast, AJCC 8th Edition  - Clinical: Stage IB (cT1c, cN1, cM0, G1, ER+, PR+, HER2-) - Signed by [name], MD on 12/02/2019       12/09/2019 -  Chemotherapy    OP BREAST AC (Dose Dense) q2W X 4 CYCLES, THEN PACLITAXEL (DOSE DENSE) Q2W x 4 CYCLES  DOXOrubicin 60 mg/m2 IV on day 1, cyclophosphamide 600 mg/m2 IV on day 1, every 2 weeks for 4 cycles, then PACLitaxel 175 mg/m2 every 2 weeks for 4 cycles      04/20/2020 Surgery    Breast, right, simple mastectomy  -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 Extracapsular extension is identified      Breast, left, simple mastectomy  -Benign breast tissue with no atypia, in situ or invasive carcinoma identified     04/20/2020 -  Cancer Staged    Staging form: Breast, AJCC 8th Edition  - Pathologic stage from 04/20/2020: No Stage Recommended (ypT1c, pN1a, cM0, G2, ER+, PR+, HER2-) - Signed by [name], MD on 04/25/2020       05/07/2020 Surgery    Lymph nodes, right axillary, dissection  -One of sixteen lymph nodes involved by metastatic adenocarcinoma (1/16)  -Metastatic focus measures 3 mm in greatest dimension  -No extracapsular extension is identified     06/28/2020 - 08/06/2020 Radiation    The total radiation dose will be 5000 cGy at 200 cGy/fraction for a total of 25 fractions, treated once a day     08/13/2020 Endocrine/Hormone Therapy    Letrozole 2.5mg  +Ovarian suppression (monthly lupron)     Malignant neoplasm of right breast in female, estrogen receptor positive (CMS-HCC)   05/07/2020 Surgery    Lymph nodes, right axillary, dissection  -One of sixteen lymph nodes involved by metastatic adenocarcinoma (1/16)  -Metastatic focus measures 3 mm in greatest dimension  -  No extracapsular extension is identified     06/13/2020 -  Radiation    Radiation Therapy Treatment Details (Noted on 06/13/2020)  Site: Right Breast - Overlapping sites  Technique: 3D CRT  Goal: No goal specified  Planned Treatment Start Date: No planned start date specified     06/21/2020 -  Radiation    Radiation Therapy Treatment Details (Noted on 06/21/2020)  Site: Right Breast - Overlapping sites  Technique: 3D CRT  Goal: No goal specified  Planned Treatment Start Date: No planned start date specified          Pertinent Labs:   No visits with results within 1 Day(s) from this visit.   Latest known visit with results is:   Lab on 10/31/2020   Component Date Value Ref Range Status   ??? Sodium 10/31/2020 136  135 - 145 mmol/L Final   ??? Potassium 10/31/2020 3.8  3.4 - 4.8 mmol/L Final   ??? Chloride 10/31/2020 107  98 - 107 mmol/L Final   ??? CO2 10/31/2020 24.0  20.0 - 31.0 mmol/L Final   ??? Anion Gap 10/31/2020 5  5 - 14 mmol/L Final   ??? BUN 10/31/2020 16  9 - 23 mg/dL Final   ??? Creatinine 10/31/2020 1.02 (A) 0.60 - 0.80 mg/dL Final   ??? BUN/Creatinine Ratio 10/31/2020 16   Final   ??? eGFR CKD-EPI (2021) Female 10/31/2020 76  >=60 mL/min/1.4m2 Final    eGFR calculated with CKD-EPI 2021 equation in accordance with [employer] and [employer] of Nephrology Task Force recommendations.   ??? Glucose 10/31/2020 82  70 - 179 mg/dL Final   ??? Calcium 16/03/9603 9.9  8.7 - 10.4 mg/dL Final   ??? Albumin 54/01/8118 3.8  3.4 - 5.0 g/dL Final   ??? Total Protein 10/31/2020 7.3  5.7 - 8.2 g/dL Final   ??? Total Bilirubin 10/31/2020 0.5  0.3 - 1.2 mg/dL Final   ??? AST 14/78/2956 16  <=34 U/L Final   ??? ALT 10/31/2020 12  10 - 49 U/L Final   ??? Alkaline Phosphatase 10/31/2020 117 (A) 46 - 116 U/L Final   ??? WBC 10/31/2020 3.4 (A) 3.6 - 11.2 10*9/L Final   ??? RBC 10/31/2020 4.00  3.95 - 5.13 10*12/L Final   ??? HGB 10/31/2020 10.6 (A) 11.3 - 14.9 g/dL Final   ??? HCT 21/30/8657 31.2 (A) 34.0 - 44.0 % Final   ??? MCV 10/31/2020 78.1  77.6 - 95.7 fL Final   ??? MCH 10/31/2020 26.6  25.9 - 32.4 pg Final   ??? MCHC 10/31/2020 34.0  32.0 - 36.0 g/dL Final   ??? RDW 84/69/6295 23.2 (A) 12.2 - 15.2 % Final   ??? MPV 10/31/2020 7.1  6.8 - 10.7 fL Final   ??? Platelet 10/31/2020 154  150 - 450 10*9/L Final   ??? Neutrophils % 10/31/2020 71.0  % Final   ??? Lymphocytes % 10/31/2020 17.9  % Final   ??? Monocytes % 10/31/2020 9.9  % Final   ??? Eosinophils % 10/31/2020 0.7  % Final   ??? Basophils % 10/31/2020 0.5  % Final   ??? Absolute Neutrophils 10/31/2020 2.4  1.8 - 7.8 10*9/L Final   ??? Absolute Lymphocytes 10/31/2020 0.6 (A) 1.1 - 3.6 10*9/L Final   ??? Absolute Monocytes 10/31/2020 0.3  0.3 - 0.8 10*9/L Final   ??? Absolute Eosinophils 10/31/2020 0.0  0.0 - 0.5 10*9/L Final   ??? Absolute Basophils 10/31/2020 0.0  0.0 - 0.1 10*9/L Final   ???  Microcytosis 10/31/2020 Slight (A) Not Present Final   ??? Anisocytosis 10/31/2020 Marked (A) Not Present Final   ??? Hypochromasia 10/31/2020 Slight (A) Not Present Final   ??? Bilirubin, Direct 10/31/2020 0.10  0.00 - 0.30 mg/dL Final   ??? Smear Review Comments 10/31/2020 See Comment (A) Undefined Final    Slide reviewed.      ??? Burr Cells 10/31/2020 Present (A) Not Present Final   ??? Vitamin B-12 10/31/2020 277  211 - 911 pg/ml Final       Current medications:     Current Outpatient Medications   Medication Sig Dispense Refill   ??? abemaciclib (VERZENIO) 150 mg Tab tablet Take 1 tablet (150 mg total) by mouth Two (2) times a day. Take with or without food. 56 tablet 5   ??? acetaminophen (TYLENOL) 325 MG tablet Take 650 mg by mouth every six (6) hours as needed for pain.     ??? calcium carbonate (TUMS) 200 mg calcium (500 mg) chewable tablet Chew 1 tablet daily.     ??? docusate sodium (COLACE) 100 MG capsule Take 100 mg by mouth.      ??? ergocalciferol-1,250 mcg, 50,000 unit, (VITAMIN D2-1,250 MCG, 50,000 UNIT,) 1,250 mcg (50,000 unit) capsule Take 1,250 mcg by mouth once a week.     ??? escitalopram oxalate (LEXAPRO) 10 MG tablet Take 1 tablet (10 mg total) by mouth in the morning. 30 tablet 5   ??? letrozole (FEMARA) 2.5 mg tablet Take 1 tablet (2.5 mg total) by mouth daily. 90 tablet 3   ??? mometasone (ELOCON) 0.1 % cream Apply to breast twice daily 90 g 1   ??? omeprazole (PRILOSEC) 20 MG capsule Take 1 capsule (20 mg total) by mouth daily. 90 capsule 3   ??? ondansetron (ZOFRAN-ODT) 4 MG disintegrating tablet Take 1 tablet (4 mg total) by mouth every eight (8) hours as needed for nausea. 30 tablet 3   ??? PAXLOVID CO-PACK, EUA, (NIRMATRELVIR-RITONAVIR) 150 mg x 2- 100 mg tablet See package instructions. 30 tablet 0   ??? rimegepant (NURTEC ODT) 75 mg TbDL Take 75 mg by mouth.      ??? SUMAtriptan (IMITREX) 100 MG tablet TAKE ONE TABLET BY MOUTH AT ONSET OF MIGRAINE FOR UP TO 1 DOSE . IF SYMPTOMS PERSIST A SECOND DOSE MAY BE TAKEN IN 2 HOURS IF NEEDED     ??? topiramate (TOPAMAX) 50 MG tablet Take 50 mg by mouth Two (2) times a day.     ??? vitamin E-134 mg, 200 UNIT, 134 mg (200 UNIT) capsule Take 134 mg by mouth daily.       No current facility-administered medications for this visit.     Facility-Administered Medications Ordered in Other Visits   Medication Dose Route Frequency Provider Last Rate Last Admin   ??? leuprolide (LUPRON) 3.75 mg injection

## 2020-11-07 ENCOUNTER — Telehealth
Admit: 2020-11-07 | Discharge: 2020-11-08 | Payer: PRIVATE HEALTH INSURANCE | Attending: Nurse Practitioner | Primary: Nurse Practitioner

## 2020-11-07 NOTE — Unmapped (Signed)
[facility] [place]  OUTPATIENT PHYSICAL THERAPY  11/07/2020  Note Type: Treatment Note       Patient Name: Tami Gibson  Date of Birth:April 08, 1991  Diagnosis:   Encounter Diagnoses   Name Primary?   ??? Lymphedema syndrome, postmastectomy Yes   ??? Scar condition and fibrosis of skin    ??? Malignant neoplasm of right breast in female, estrogen receptor positive, unspecified site of breast (CMS-HCC)      Referring MD:  Jobe Marker*     Date of Onset of Impairment-10/20/2020  Date PT Care Plan Established or Reviewed-10/30/2020  Date PT Treatment Started-10/30/2020   Plan of Care Effective Date: 10-30-20 to 01-28-21      Session #:  2      Contraindications:  none  Precautions:  none  Red Flags:  history of cancer, right breast IB    Metastatic cancer:   no      ASSESSMENT/Progress toward goals:  Pt is progressing toward meeting goals and Patient's skin integrity is improving.  Skin is softening with treatment which allows for improved lymph uptake, and decreased risk of infection Has gotten Job[employer] Strong 20-30 sleeve and glove, RTW.  Awaiting night garment and pump.  Skin on arms is soft, cording noted at elbow.    Recommendations for treatment/garments:  1) Patient will obtain proper garments to address swelling and improve tissue integrity. Garment recommendations : Mediven Harmony CCL 2 sleeve and glove, Jovi pak night time compression  2) Pt will be seen clinically for rehabilitation to include treatments to address as needed: edema/lymphedema, pain, ROM, strength, balance and endurance deficits, as well as learning self care strategies to address these deficits  3) Pt will require a pneumatic compression pump because despite conservative treatment [id number] garments or devices, exercise, elevation, skin care and self MLD,the patient exhibits the following symptoms:hyperplasia, hyperpigmentation, fibrosis, truncal/abdominal swelling, chest/axillary swelling and unable to control swelling    Patient requires skilled Physical Therapy services  for the following problem list and secondary functional limitations:    Problem List:   Uncontrolled swelling and/or lack of appropriate compression garment    Secondary Functional Limitations:  Uncontrolled swelling can increase chances of  infection, and limit functional ROM     Patient Goals: Decrease swelling and use as many devices as needed for self care so that I get this under control    Physical Therapy Goals:   In 1-3 months: Pt and/or caregiver will:  Be able to demonstrate self MLD, donning/doffing garments to manage swelling   Decrease limb volume by 1-3 % for proper fit of clothing and to minimize infection risk    In 6-12 months: Pt and/or caregiver will:  Retain an optimal limb volume that allows pt to fit into appropriate compression garments and clothing to improve functional use of UE and QOL    Prognosis for goal achievement:   Good due to previous success.      PLAN:   Within a 90 day certification period, schedule permitting, decreasing frequency as able, patient will participate in the following as needed: Manual lymphatic drainage, compression bandaging or use of reduction kits, appropriate garment recommendations, skin care and therapeutic exercise, manual therapy, low level laser therapy, lymphatouch, taping, ROM and strength training, education on posture and body mechanics,pump/equipment recommendations, balance exercises,endurance exercises, and strategies to assist with sensation deficits and pain reduction.      Planned frequency and duration  of treatment:   1 x week x 10 weeks  Next Visit Plan:  Take circ, MLD, MFR, lymphatouch, taping to hand prn  pump check  Pt to continue with current plan of care to educate in self care, precautions, and management of symptoms, optimal edema reduction/maintenance of girth, soft tissue changes, fitting of appropriate compression garments, and progression of exercises to optimize functional ROM, strength, balance and endurance in all ADL's(home, work, recreational, community)    SUBJECTIVE:  Patient???s communication preference: verbal, written, visual, prn   History of Present Illness/ Pt reports:  Got a new sleeve and glove, it feels tighter than what I had before   Location of pain: No pain        OBJECTIVE:      Posture/Observations:  Forward head, rounded shoulders     Range of Motion/Flexibilty:   UE WFL   Axillary Web Syndrome location: no     Strength/MMT:   UE WFL        Girth Measurements: taken in cm    Date: 5-31 5-31            Right Left RIght Left Right Left RIght Left RIght Left   MCP 16.8 16.5           Mid palm 18 18           Thumb DIP 6            1st finger DIP 4.9            1st finger PIP 6            2nd finger DIP 4.9            2nd finger PIP 5.9            3rd finger DIP 4.5            3rd finger PIP 5.3            4th finger DIP 4.0            4th finger PIP 4.5                         Axilla             Mid breast nipple line             Ribs below breast             Natural Waistline             Umbilicus             Hips at  cm below umbilicus             Limb Volume            Involved arm: Right   Dominant arm: Right   Patient position: Seated   Total volume: right 2893.12 mL 10/30/2020 11:47 AM   Total volume: left 2898.97 mL 10/30/2020 11:47 AM   L/R difference -5.85 mL -0.20 %               Location of swelling:  right hand , wrist, forearm, elbow, arm, shoulder, axilla and chest    Skin condition:  Stemmer???s sign:yes.  increased skin thickness and fibrosis  Incision/Scar:   incision healing well postoperatively    Sensation :  Peripheral neuropathy:no  Light touch:     Decreased sensation under axilla, around scar, and posterior lateral upper arm   Proprioception:  not tested  Vibration:   not  tested    Gait:  WNL  Balance: WFL    Endurance/Fatigue: WFL  DME needs related to safety:none      Total Treatment Time: 53 min  Treatment Rendered:    Patient and/or caregiver and therapist were mask compliant per current [employer] COVID mask policy during the entire treatment session.      Manual x 53 min   Lymphatouch to affected areas with simultaneous MFR and MLD to appropriate anastomoses in order to decrease swelling.  Application of compression garment/devices prn to prevent re-accumulation of fluid.          Equipment provided/recommended:   N/A    Communication/consultation with other professionals:  Email to DME re: garment needs Jovi pak night time garment    Referrals made to the following providers:  none    Medical Necessity: This treatment is medically necessary throughout the course of this patient's life during and after cancer treatments to improve functional activities and /or to minimize the decline of functional abilities and worsening of symptoms,including infection and recurrent hospitalizations, through independent/home management strategies.  Lymphedema, a chronic, progressive condition for which there is no cure, is marked by the accumulation of protein-rich fluid in one or more quadrants of the body due to primary or secondary disruption of the lymphatic system.  The sustained accumulation results in tissue inflammation, an increase in fatty tissue, and development of obstructive connective tissue.  These changes may result in an increased risk of infection, disfigurement and a decrease in mobility and functional performance.   Pt will benefit from physical therapy by a certified lymphedema therapist to address : education in self care, precautions, and management of symptoms, optimal edema reduction/maintenance of girth, optimal soft tissue changes, fitting of appropriate compression garments, and progression of exercises to optimize functional ROM and strength, balance, and endurance in all ADL's(home, work, recreational, community).      I attest that I have reviewed the above information.  Signed: Clementeen Graham, PT   11/07/2020 10:03 AM

## 2020-11-07 NOTE — Unmapped (Signed)
Integrative Medicine Consultation - Virtual Visit during COVID-19 Pandemic    Tami Gibson was in a quiet, private location in her workplace and no one else was present for the visit.    I spent 60 minutes on the real-time audio and video with the patient on the date of service. I spent an additional 30 minutes on pre- and post-visit activities on the date of service.     The patient was physically located in [place] or a state in which I am permitted to provide care. The patient understood that she may incur co-pays and cost sharing, and agreed to the telemedicine visit. The visit was reasonable and appropriate under the circumstances given the patient's presentation at the time.    The patient has been advised of the potential risks and limitations of this mode of treatment (including, but not limited to, the absence of in-person examination) and has agreed to be treated using telemedicine. The patient's questions regarding telemedicine have been answered.     If the visit was completed in an ambulatory setting, the patient has also been advised to contact their provider???s office for worsening conditions, and seek emergency medical treatment and/or call 911 if the patient deems either necessary.    [facility] Integrative Medicine Service  CCSP New Patient Consultation Note    ------------------------------------------------------------------------------------------------    Assessment and Plan      Assessment:   Tami Gibson was diagnosed with Stage IB right breast cancer, ER+/PR+/HER2-, June 2021 and underwent a right mastectomy along with a prophylactic left mastectomy with reconstruction. Additionally she received ddAC with Taxol and adjuvant XRT and is now on Femara and Lupron, as well as Verzenio. Verzenio was recently held, starting 6/7, for 2 weeks due to persistent abdominal pain and diarrhea. However, it is not clear what the etiology is since she had COVID in May and since then has had abdominal discomfort. 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 Experiencing hot flashes, which occasionally interrupt sleep but able to get back to sleep fairly easily. Gained weight during active treatment and would like to lose weight. Considering a nutrition plan with coaching called [employer] for weight loss. Currently appetite is low following COVID, although she did not lose taste sensation. Recently had a flare in right sided arm lymphedema and is working with Luetta Nutting, PT of lymphatic drainage. Wears a compression garment and is getting a compression device soon. Has received information about [employer] and considering joining although may wait until the fall due to her two daughter's active swim lessons this summer.  Receiving emotional counseling with [name], MSW and was recently seen in the AYA survivorship clinic for additional support. Is very interested in strategies that may help improve her health and well-being, as well as possibly reduce risk for recurrence.    Plan:     1) Weight Loss:  Best plan for weight loss is through both diet and regular physical activity. Could consider the Optavia Diet Plan temporarily for 3-4 months for weight loss, as the coaching element may provide additional motivation/support for weight loss. However, since this plan includes processed foods, is costly and rather restrictive would not recommend long term use.    Nutrition - Recommend possibly moving towards an Anti-inflammatory/Mediterranean-style Diet with more fruits & vegetables, especially berries and cruciferous vegetables (at least 5-6/day, but preferably up to 9 servings/day of fruits & veggies), cooked culinary mushrooms, increased fish intake (especially fish  high in omega-3-fatty acids such as salmon, halibut, cod, sardines etc.), decrease red meat intake to no more than twice weekly, incorporate nuts, seeds (especially ground flax seed), beans, whole soy foods (tofu, tempeh, edamame, etc.) and whole grains (e.g., brown rice instead of white rice, whole grain pastas, quinoa, etc).  Add either extra virgin olive oil or avocado oil in salads, vegetables, etc. daily as healthy sources of dietary fats.  Add herbs and spices to meals, such as turmeric, ginger, cinnamon, mint, thyme, rosemary, oregano, garlic, onions, etc. as many have been found to have anti-inflammatory and/or anti-cancer effects.  Portion control is a way to decrease daily caloric calories, especially with nuts even though they are a good source of healthy fats, protein and some essential vitamins and minerals. Limit or avoid processed foods and sugary beverages. Aim to drink approximately 6-8 eight ounces of water intake daily.    Alcohol Intake -  American Cancer Society (ACS) recommendation is to limit alcohol intake to no more than one drink/day for women. Alcoholic drink defined as follows: 12 ounces of beer, 5 ounces of wine or 1.5 ounces of spirits. Since alcohol is a carcinogen and has been associated with breast cancer, recommend continuing to abstain from alcohol.    For more information about an Anti-inflammatory Lifestyle, please see the following handout in the link below:    [email or web address]    Cancer Survivor Nutrition Site from [facility] - tips for dealing with treatment side effects and healthy recipes    [email or web address]    Physical Activity - Highly recommend starting [employer] exercise program. Discussed the myriad of benefits from regular exercise, including weight loss, improved energy and sleep, as well as the relationship between exercise and decreased mortality for some cancers and all-cause mortality. Provided with the general recommendations for cancer patients of aiming for at least 150 minutes of moderate aerobic activity a week and strength training of all major muscle groups at least twice/week, as well as regular stretching for improved flexibility.  Discussed the importance of social support for helping to achieve goals. Recommend using a calendar or exercise app to aid in motivation.         2) Fatigue:  Recommendations with clinical evidence for Cancer-Related Fatigue (CRF) during treatment and/or after treatments     a. Physical Activity - both aerobic and strength training has the highest level of evidence for CRF than any other strategy. During treatment aim for 30 minutes three times/week or 45 minutes twice weekly of aerobic exercise and strength training 1-2 days/week. Post-treatment progressively move up to 150 minutes of moderate intensity aerobic exercise/week with twice weekly strength training of all major muscle groups.    A 2018 meta-analysis of 170 studies conducted among a wide range of cancer patients with different diagnoses and stages of disease, as well as hematopoietic stem cell transplant patients, revealed a statistically significant reduction in fatigue when compared to controls.   Herbert Moors et al. (2018). Physical activity reduces fatigue in patients with cancer and hematopoietic stem cell transplant recipients: A systematic review and meta-analysis of randomized trials. Crit Rev Oncol Hematol, 122, 52-59.]    b. Acupuncture - Once a week for 6 or more weeks.    A few RCTs among breast and lung cancer patients have found significant reductions in fatigue levels following a series of acupuncture treatments. One of the largest studies was among over 300 breast cancer patients. [Molassiotis, et al. (2012). Acupuncture for cancer-related fatigue  in patients with breast cancer: a pragmatic randomized controlled trial. J Clin Oncol, 30, 4470-4476.]    c. Gentle Yoga - One to two 60 to 90-minute classes per week for 12 weeks  and home-based daily practice.    A few small studies among patients with various cancer diagnoses revealed that a gentle form of Hatha yoga (e.g., slow flow with holding of poses) reduced fatigue after 8 to 12- week interventions.      3) Hot flashes:  Could consider acupuncture as there is some evidence on the benefit for reducing hot flash frequency/severity.    Acupuncture at [facility].  Appointments with Dr. Imogene Burn through the Comprehensive Cancer Support Program at [facility] can be arranged by calling [facility] at [phone].    Other options in the area are as follows:    [facility], [place] and [place] locations  [email or web address]    [facility]  [place], [place]  [phone]  [email or web address]    [facility], [place], [place]  [place], [place]  [phone]  [email or web address]      4) Emotional Health/Mind & Body Strategies:  Continue counseling with [name], MSW and recommend regular mindfulness practice, guided imagery, and/or progressive muscle relaxation. Consider a gentle yoga practice.      5) Supplements:  Of note, Prilosec is known to delete several nutrients including magnesium and B vitamins. Recommend a high quality daily Multivitamin for Women. [employer] is one brand quality checked by the USP.        6) Migraine prevention/severity of episodes:  Consider Magnesium 400-600 mg daily, which has been recommended by the American Headache Society. Taking it at bedtime may also help with sleep. Since magnesium can cause diarrhea start with lower dose and try magnesium glycinate since it has less GI effects.       I spent 60 minutes today with > 50% in face-to-face counseling.  ------------------------------------------------------------------------------------------------    Service  Service Date :11/07/2020  Nurse Practitioner: Zada Finders, PhD, MPH, ANP  Service:FAMILY MEDICINE::NO DIVISION     Patient  Name:Tami Gibson  VHQI:[id number]  Age:30 y.o.  GMW:[id number]    Present Illness  Tami Gibson is a very pleasant 30 y.o. year old female, referred by Mariana Kaufman, NP with the AYA survivorship program. Tami Gibson presents today to discuss integrative medicine strategies that may help improve her overall health and well-being, as well as reduce risk for breast cancer recurrence. In brief, she was diagnosed with ER+/PR+/HER2neg early stage right breast cancer June 2021 and underwent a right mastectomy, left prophylactic mastectomy, adjuvant radiation therapy, ddAC with Taxol and now on Femara and Lupron, as well as Verzenio. However, was advised by Tami Gibson, Pharmacist with the breast clinic, to hold the [facility] for 2 weeks due to abdominal pain with diarrhea, which at times has been severe. It is uncertain as to whether the abdominal symptoms are due to [facility] or post-COVID syndrome. She had COVID in May and since then has had increased fatigue, abdominal pain, diarrhea and low appetite. Had an episode of severe abdominal pain Monday night with diarrhea, but today is feeling considerably better. Started holding Verzenio on Tuesday, 6/7. Will monitor symptoms over the next 2 weeks and reevaluate when to restart Verzenio. Working with Luetta Nutting, PT for recent flare in right arm edema, which has been made worse with the heat and excessive use in helping daughter during swim lessons. Not currently doing any resistance  training or regular aerobic exercise, but interested in [employer]. Would like to lose weight she gained during chemotherapy and is planning to start the diet plan, Optavia, which includes coaching (of note, coaching is typically done by previous clients and not credentialed nutrition specialists). Working with [name], LCSW for emotional support/adjustment to survivorship phase. Married to a supportive husband and they have two daughters, ages 54 and 78. They have been enjoying going to her mother's [place] house about [place] hour from [place], on the weekends, which she indicates is a place that provides her with a sense of calm and peace. Working full-time at [employer] as a Nurse, adult on [employer], which is a position she really enjoys. As worked as a Architect in the past and is skilled at relaxation techniques such as PMR and guided imagery, but not regularly using them at this time. Occasionally listens to [employer] of relaxation music, which she finds helpful.      Oncology History  Oncology History Overview Note   30 year old female with recently self palpated right breast lump. A targeted right breast ultrasound on 11/15/2019 showed an irregular mass with spiculated margins at the 7:00 position, 6 cm from the nipple, measuring 1.4 x 1.2 x 1.0 cm. There were 2 abnormal level 1 axillary lymph nodes measuring 1.4 x 1.3 cm and 1.2 x 0.9 cm. Additionally, there was a high level 1/level 2 axillary lymph node, measuring 2.1 x 1.3 cm with an effaced fatty hilum.      Malignant neoplasm of right breast in female, estrogen receptor positive (CMS-HCC)   11/09/2019 Initial Diagnosis    Malignant neoplasm of right breast in female, estrogen receptor positive (CMS-HCC)     11/15/2019 Biopsy    Invasive ductal carcinoma  - Nottingham combined histologic grade: 1  ER 91-100%, PR 91-100%, HER2 negative by IHC    Lymph node, right axilla, core biopsy  - Lymph node positive for metastatic carcinoma,      12/02/2019 -  Cancer Staged    Staging form: Breast, AJCC 8th Edition  - Clinical: Stage IB (cT1c, cN1, cM0, G1, ER+, PR+, HER2-) - Signed by [name], MD on 12/02/2019       12/09/2019 -  Chemotherapy    OP BREAST AC (Dose Dense) q2W X 4 CYCLES, THEN PACLITAXEL (DOSE DENSE) Q2W x 4 CYCLES  DOXOrubicin 60 mg/m2 IV on day 1, cyclophosphamide 600 mg/m2 IV on day 1, every 2 weeks for 4 cycles, then PACLitaxel 175 mg/m2 every 2 weeks for 4 cycles      04/20/2020 Surgery    Breast, right, simple mastectomy  -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 Extracapsular extension is identified      Breast, left, simple mastectomy  -Benign breast tissue with no atypia, in situ or invasive carcinoma identified     04/20/2020 -  Cancer Staged    Staging form: Breast, AJCC 8th Edition  - Pathologic stage from 04/20/2020: No Stage Recommended (ypT1c, pN1a, cM0, G2, ER+, PR+, HER2-) - Signed by [name], MD on 04/25/2020       05/07/2020 Surgery    Lymph nodes, right axillary, dissection  -One of sixteen lymph nodes involved by metastatic adenocarcinoma (1/16)  -Metastatic focus measures 3 mm in greatest dimension  -No extracapsular extension is identified  06/28/2020 - 08/06/2020 Radiation    The total radiation dose will be 5000 cGy at 200 cGy/fraction for a total of 25 fractions, treated once a day     08/13/2020 Endocrine/Hormone Therapy    Letrozole 2.5mg  +Ovarian suppression (monthly lupron)     Malignant neoplasm of right breast in female, estrogen receptor positive (CMS-HCC)   05/07/2020 Surgery    Lymph nodes, right axillary, dissection  -One of sixteen lymph nodes involved by metastatic adenocarcinoma (1/16)  -Metastatic focus measures 3 mm in greatest dimension  -No extracapsular extension is identified     06/13/2020 -  Radiation    Radiation Therapy Treatment Details (Noted on 06/13/2020)  Site: Right Breast - Overlapping sites  Technique: 3D CRT  Goal: No goal specified  Planned Treatment Start Date: No planned start date specified     06/21/2020 -  Radiation    Radiation Therapy Treatment Details (Noted on 06/21/2020)  Site: Right Breast - Overlapping sites  Technique: 3D CRT  Goal: No goal specified  Planned Treatment Start Date: No planned start date specified         Past Medical history  Past Medical History:   Diagnosis Date   ??? Acne     started during teen yrs., Accutane 2008-2009 during college yrs   ??? Anemia     During teen years only, no anemia recently   ??? 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 Gestational hypertension 01/30/2017    end of pregnancy had hypertension, was induced   ??? Headache     migraine occasionally   ??? Hypertension     during week 37 week, pt induced and had C/S due to hypertension   ??? 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 Kidney stone    ??? Malignant neoplasm of right breast in female, 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/30/2019   ??? Pyelonephritis 09/2016    Kidney stones   ??? Urinary tract infection     Had 5-6 in Lifetime, esp high school and college age   ??? Varicella     during childhood   ??? Vitamin B 12 deficiency        Past Surgical History:   Procedure Laterality Date   ??? BREAST BIOPSY Right     IDC   ??? CESAREAN SECTION  01/30/2017    breech   ??? CHEMOTHERAPY     ??? IR INSERT PORT AGE GREATER THAN 5 YRS  12/08/2019    IR INSERT PORT AGE GREATER THAN 5 YRS 12/08/2019 Jobe Gibbon, MD IMG VIR H[facility]   ??? LUMBAR PUNCTURE DIAGNOSTIC [facility] HISTORICAL RESULT)  01/2015    to relieve ICP   ??? PR BX/REMV,LYMPH NODE,DEEP AXILL Right 04/20/2020    Procedure: BX/EXC LYMPH NODE; OPEN, DEEP AXILRY NODE;  Surgeon: Moss Mc, MD;  Location: MAIN OR [facility];  Service: Surgical Oncology Breast   ??? PR CESAREAN DELIVERY ONLY N/A 01/30/2017    Procedure: CESAREAN DELIVERY ONLY;  Surgeon: Asher Muir, MD;  Location: L&D C-SECTION OR SUITES [facility];  Service: Maternal-Fetal Medicine   ??? PR CESAREAN DELIVERY ONLY N/A 02/17/2019    Procedure: CESAREAN DELIVERY ONLY;  Surgeon: Lonny Prude, MD;  Location: L&D C-SECTION OR SUITES [facility];  Service: Maternal-Fetal Medicine   ??? PR IMPLNT BIO IMPLNT FOR SOFT TISSUE REINFORCEMENT Bilateral 04/20/2020    Procedure: IMPLANTATION BIOLOGIC IMPLANT(EG, ACELLULAR DERMAL MATRIX) FOR SOFT TISSUE REINFORCEMENT(EG,  BREAST, TRUNK); Surgeon: Arsenio Katz, MD;  Location: MAIN OR [facility];  Service: Plastics   ??? PR INTRAOPERATIVE SENTINEL LYMPH NODE ID W DYE INJECTION Right 04/20/2020    Procedure: INTRAOPERATIVE IDENTIFICATION SENTINEL LYMPH NODE(S) INCLUDE INJECTION NON-RADIOACTIVE DYE, WHEN PERFORMED;  Surgeon: Moss Mc, MD;  Location: MAIN OR [place];  Service: Surgical Oncology Breast   ??? PR INTRAOPERATIVE SENTINEL LYMPH NODE ID W DYE INJECTION Right 05/07/2020    Procedure: INTRAOPERATIVE IDENTIFICATION SENTINEL LYMPH NODE(S) INCLUDE INJECTION NON-RADIOACTIVE DYE, WHEN PERFORMED;  Surgeon: Moss Mc, MD;  Location: MAIN OR [place];  Service: Surgical Oncology Breast   ??? PR MASTECTOMY, SIMPLE, COMPLETE Bilateral 04/20/2020    Procedure: MASTECTOMY, SIMPLE, COMPLETE;  Surgeon: Moss Mc, MD;  Location: MAIN OR [place];  Service: Surgical Oncology Breast   ??? PR REMOVE ARMPITS LYMPH NODES COMPLT Right 05/07/2020    Procedure: AXILLARY LYMPHADENECTOMY; COMPLETE;  Surgeon: Moss Mc, MD;  Location: MAIN OR [place];  Service: Surgical Oncology Breast   ??? PR TISSUE EXPANDER PLACEMENT BREAST RECONSTRUCTION Bilateral 04/20/2020    Procedure: TISSUE EXPANDER PLACEMENT IN BREAST RECONSTRUCTION, INCLUDING SUBSEQUENT EXPANSION(S);  Surgeon: Arsenio Katz, MD;  Location: MAIN OR [facility];  Service: Plastics   ??? TONSILECTOMY, ADENOIDECTOMY, BILATERAL MYRINGOTOMY AND TUBES     ??? WISDOM TOOTH EXTRACTION  2004       Social/Family History  Social History     Socioeconomic History   ??? Marital status: Married     Spouse name: Dylan   ??? Number of children: 0   Occupational History   ??? Occupation: Recreational therapist    Tobacco Use   ??? Smoking status: Never Smoker   ??? Smokeless tobacco: Never Used   Vaping Use   ??? Vaping Use: Never used   Substance and Sexual Activity   ??? Alcohol use: Not Currently   ??? Drug use: No   ??? Sexual activity: Yes     Partners: Male     Birth control/protection: Pill   Other Topics Concern   ??? Exercise Yes   ??? Living Situation No   Social History Narrative    PRECONCEPTUAL ASSESSMENT:        Latayvia is a 30 y.o. Caucasian female    Caffeine:denies use    Cats: no    Exercise: not active    Diet: balanced    Family hx of of birth defects, chromosomal abnormality, intellectual disability, developmental delay: no.            PARTNER HISTORY:     Domingo Dimes is a 11 y.o.  Caucasian female.    Occupation:  Immunologist; [employer]    He has fathered children:  no    He is healthy with no chronic illness    Family history of  birth defects, chromosomal abnormality, intellectual disability, or developmental delay: no.              Social Determinants of Health     Financial Resource Strain: Low Risk    ??? Difficulty of Paying Living Expenses: Not very hard   Food Insecurity: No Food Insecurity   ??? Worried About Running Out of Food in the Last Year: Never true   ??? Ran Out of Food in the Last Year: Never true   Transportation Needs: No Transportation Needs   ??? Lack of Transportation (Medical): No   ??? Lack of Transportation (Non-Medical): No       Family History   Problem Relation Age of Onset   ???  Cancer Maternal Aunt 45        breast Ca   ??? Hypertension Father    ??? Other Father 25        Benign brain tumor   ??? Hypothyroidism Mother    ??? Cancer Paternal Aunt         Breast Ca dx in 62s   ??? ALS Maternal Aunt    ??? Ovarian cancer Maternal Grandmother 56   ??? No Known Problems Maternal Grandfather    ??? Cancer Paternal Grandmother         Unknown Cancer   ??? No Known Problems Paternal Grandfather    ??? No Known Problems Maternal Aunt    ??? No Known Problems Maternal Aunt    ??? Breast cancer Maternal Cousin         Dx in 57s   ??? No Known Problems Paternal Aunt    ??? No Known Problems Daughter    ??? No Known Problems Daughter    ??? No Known Problems Other    ??? BRCA 1/2 Neg Hx    ??? Colon cancer Neg Hx    ??? Endometrial cancer Neg Hx        Allergies    No Known Allergies    Body Mass Index  Noted as 34.91 kg/m2 from last clinic visit    Substance Use  Social History     Substance and Sexual Activity   Alcohol Use Not Currently      reports previous alcohol use.  Social History     Tobacco Use   Smoking Status Never Smoker   Smokeless Tobacco Never Used      reports that she has never smoked. She has never used smokeless tobacco.  Social History     Substance and Sexual Activity   Drug Use No         Pertinent Medications    Current Outpatient Medications:   ???  abemaciclib (VERZENIO) 150 mg Tab tablet, Take 1 tablet (150 mg total) by mouth Two (2) times a day. Take with or without food., Disp: 56 tablet, Rfl: 5  ???  acetaminophen (TYLENOL) 325 MG tablet, Take 650 mg by mouth every six (6) hours as needed for pain., Disp: , Rfl:   ???  calcium carbonate (TUMS) 200 mg calcium (500 mg) chewable tablet, Chew 1 tablet daily., Disp: , Rfl:   ???  docusate sodium (COLACE) 100 MG capsule, Take 100 mg by mouth. , Disp: , Rfl:   ???  ergocalciferol-1,250 mcg, 50,000 unit, (VITAMIN D2-1,250 MCG, 50,000 UNIT,) 1,250 mcg (50,000 unit) capsule, Take 1,250 mcg by mouth once a week., Disp: , Rfl:   ???  escitalopram oxalate (LEXAPRO) 10 MG tablet, Take 1 tablet (10 mg total) by mouth in the morning., Disp: 30 tablet, Rfl: 5  ???  letrozole (FEMARA) 2.5 mg tablet, Take 1 tablet (2.5 mg total) by mouth daily., Disp: 90 tablet, Rfl: 3  ???  mometasone (ELOCON) 0.1 % cream, Apply to breast twice daily, Disp: 90 g, Rfl: 1  ???  omeprazole (PRILOSEC) 20 MG capsule, Take 1 capsule (20 mg total) by mouth daily., Disp: 90 capsule, Rfl: 3  ???  ondansetron (ZOFRAN-ODT) 4 MG disintegrating tablet, Take 1 tablet (4 mg total) by mouth every eight (8) hours as needed for nausea., Disp: 30 tablet, Rfl: 3  ???  PAXLOVID CO-PACK, EUA, (NIRMATRELVIR-RITONAVIR) 150 mg x 2- 100 mg tablet, See package instructions., Disp: 30 tablet, Rfl: 0  ???  rimegepant (NURTEC ODT) 75 mg TbDL, Take 75 mg by mouth. , Disp: , Rfl:   ???  SUMAtriptan (IMITREX) 100 MG tablet, TAKE ONE TABLET BY MOUTH AT ONSET OF MIGRAINE FOR UP TO 1 DOSE . IF SYMPTOMS PERSIST A SECOND DOSE MAY BE TAKEN IN 2 HOURS IF NEEDED, Disp: , Rfl:   ???  topiramate (TOPAMAX) 50 MG tablet, Take 50 mg by mouth Two (2) times a day., Disp: , Rfl:   ???  vitamin E-134 mg, 200 UNIT, 134 mg (200 UNIT) capsule, Take 134 mg by mouth daily., Disp: , Rfl:   No current facility-administered medications for this visit.    Facility-Administered Medications Ordered in Other Visits:   ???  leuprolide (LUPRON) 3.75 mg injection, , , ,     Review of Systems  12 pt ROS negative except as noted in HPI.    Physical Examination  General - AAOx3, NAD. Well dressed.  Psychiatry - appropriate affect and mood. ST and LT memory intact, intact executive fn.      Zada Finders, PhD, MPH, ANP-BC

## 2020-11-08 NOTE — Unmapped (Signed)
It was such a pleasure meeting you today Tami Gibson and I truly hope some of the information was helpful. Please see a summary of recommendations based on our discussion today along with some additional resources.    1) Weight Loss:  Best plan for weight loss is through both diet and regular physical activity. Could consider the [employer] Diet Plan temporarily for 3-4 months for weight loss, as the coaching element may provide additional motivation/support for weight loss. However, since this plan includes processed foods, is costly and rather restrictive would not recommend long term use.    Nutrition - Recommend possibly moving towards an Anti-inflammatory/Mediterranean-style Diet with more fruits & vegetables, especially berries and cruciferous vegetables (at least 5-6/day, but preferably up to 9 servings/day of fruits & veggies), cooked culinary mushrooms, increased fish intake (especially fish high in omega-3-fatty acids such as salmon, halibut, cod, sardines etc.), decrease red meat intake to no more than twice weekly, incorporate nuts, seeds (especially ground flax seed), beans, whole soy foods (tofu, tempeh, edamame, etc.) and whole grains (e.g., brown rice instead of white rice, whole grain pastas, quinoa, etc).  Add either extra virgin olive oil or avocado oil in salads, vegetables, etc. daily as healthy sources of dietary fats.  Add herbs and spices to meals, such as turmeric, ginger, cinnamon, mint, thyme, rosemary, oregano, garlic, onions, etc. as many have been found to have anti-inflammatory and/or anti-cancer effects.  Portion control is a way to decrease daily caloric calories, especially with nuts even though they are a good source of healthy fats, protein and some essential vitamins and minerals. Limit or avoid processed foods and sugary beverages. Aim to drink approximately 6-8 eight ounces of water intake daily.    Alcohol Intake -  American Cancer Society (ACS) recommendation is to limit alcohol intake to no more than one drink/day for women. Alcoholic drink defined as follows: 12 ounces of beer, 5 ounces of wine or 1.5 ounces of spirits. Since alcohol is a carcinogen and has been associated with breast cancer, recommend continuing to abstain from alcohol.    For more information about an Anti-inflammatory Lifestyle, please see the following handout in the link below:    [email or web address]    Cancer Survivor Nutrition Site from [facility] - tips for dealing with treatment side effects and healthy recipes    [email or web address]    Physical Activity - Highly recommend starting Get REAL & HEEL exercise program. Discussed the myriad of benefits from regular exercise, including weight loss, improved energy and sleep, as well as the relationship between exercise and decreased mortality for some cancers and all-cause mortality. Provided with the general recommendations for cancer patients of aiming for at least 150 minutes of moderate aerobic activity a week and strength training of all major muscle groups at least twice/week, as well as regular stretching for improved flexibility.  Discussed the importance of social support for helping to achieve goals. Recommend using a calendar or exercise app to aid in motivation.         2) Fatigue:  Recommendations with clinical evidence for Cancer-Related Fatigue (CRF) during treatment and/or after treatments     a. Physical Activity - both aerobic and strength training has the highest level of evidence for CRF than any other strategy. During treatment aim for 30 minutes three times/week or 45 minutes twice weekly of aerobic exercise and strength training 1-2 days/week. Post-treatment progressively move up to 150 minutes of moderate intensity aerobic exercise/week with twice weekly strength training of  all major muscle groups.    A 2018 meta-analysis of 170 studies conducted among a wide range of cancer patients with different diagnoses and stages of disease, as well as hematopoietic stem cell transplant patients, revealed a statistically significant reduction in fatigue when compared to controls.   Herbert Moors et al. (2018). Physical activity reduces fatigue in patients with cancer and hematopoietic stem cell transplant recipients: A systematic review and meta-analysis of randomized trials. Crit Rev Oncol Hematol, 122, 52-59.]    b. Acupuncture - Once a week for 6 or more weeks.    A few RCTs among breast and lung cancer patients have found significant reductions in fatigue levels following a series of acupuncture treatments. One of the largest studies was among over 300 breast cancer patients. [Molassiotis, et al. (2012). Acupuncture for cancer-related fatigue in patients with breast cancer: a pragmatic randomized controlled trial. J Clin Oncol, 30, 4470-4476.]    c. Gentle Yoga - One to two 60 to 90-minute classes per week for 12 weeks  and home-based daily practice.    A few small studies among patients with various cancer diagnoses revealed that a gentle form of Hatha yoga (e.g., slow flow with holding of poses) reduced fatigue after 8 to 12- week interventions.      3) Hot flashes:  Could consider acupuncture as there is some evidence on the benefit for reducing hot flash frequency/severity.    Acupuncture at [facility].  Appointments with Dr. Imogene Burn through the Comprehensive Cancer Support Program at [facility] can be arranged by calling [facility] at [phone].    Other options in the area are as follows:    [facility], [place] and [place] locations  [email or web address]    [facility]  [place], [place]  [phone]  [email or web address]    [facility], [place], [place]  [place], [place]  [phone]  [email or web address]      4) Emotional Health/Mind & Body Strategies:  Continue counseling with [name], MSW and recommend regular mindfulness practice, guided imagery, and/or progressive muscle relaxation. Consider a gentle yoga practice.      5) Supplements:  Of note, Prilosec is known to delete several nutrients including magnesium and B vitamins. Recommend a high quality daily Multivitamin for Women. [employer] is one brand quality checked by the [employer].        6) Migraine prevention/severity of episodes:  Consider Magnesium 400-600 mg daily, which has been recommended by the American Headache Society. Taking it at bedtime may also help with sleep. Since magnesium can cause diarrhea start with lower dose and try magnesium glycinate since it has less GI effects.     7) Recommend the book entitled, Anticancer Living: Transform Your Life and Health with the Mix of Six by Boyce Medici, PhD and Paul Half, published 2018.     Please let me know if you have any further questions or if you would like a follow-up appointment.    Wishing you the best of health moving forward,  Mylo Red, PhD, MPH, ANP, [email or web address]

## 2020-11-09 NOTE — Unmapped (Signed)
Patient denied refills for Verzenio at this time, medication is currently on for two weeks.Patient states that she has 2.5 weeks of medication on hand. Will follow up in two weeks to schedule delivery.

## 2020-11-19 ENCOUNTER — Ambulatory Visit
Admit: 2020-11-19 | Discharge: 2020-11-20 | Payer: PRIVATE HEALTH INSURANCE | Attending: Adult Health | Primary: Adult Health

## 2020-11-19 DIAGNOSIS — Z170 Estrogen receptor positive status [ER+]: Principal | ICD-10-CM

## 2020-11-19 DIAGNOSIS — C50911 Malignant neoplasm of unspecified site of right female breast: Principal | ICD-10-CM

## 2020-11-19 NOTE — Unmapped (Signed)
Patient Name: Tami Gibson  Patient Age: 30 y.o.  Encounter Date: 11/19/2020    Referring Physician:   Durenda Hurt, MD  [address]  [address]  [place],  [place] [address]    Primary Care Provider:  Durenda Hurt, MD    Breast Cancer Treatment Team:  Surgical Oncology: Jobe Marker, MD  Surgical Oncology NP: Pollyann Samples, NP  Radiation Oncology: Rogelio Seen, MD  Medical Oncology: Adonis Brook, MD  Plastic Surgery: Lamarr Lulas, MD  Genetics: [employer] STAT panel    Reason for Visit: Breast cancer surveillance visit    HPI:  Tami Gibson is a 30 y.o. female who presents for followup after breast cancer surgery. Below is a synopsis of past and present health information pertinent to her breast cancer diagnosis, treatment and current health status. She is accompanied today by her husband, Tami Gibson who also contributes to the history. Tami Gibson was last seen in clinic on 05/17/2020. Since that visit she denies associated symptoms including pain, redness, fever, chills, breast mass, breast retraction, nipple inversion, breast swelling or nipple discharge.  She has been having difficulty with lymphedema.  This was progressive when she failed to wear her sleeve one day in the heat.  It took her approximately 1.5 weeks to regain control of her symptoms. She is now limiting her time out of her sleeve and is back working with PT. She also has a pump at home which also helps. She inquires about surgical intervention to see if this would help with her symptoms. Her abemaciclib is on hold for the past two weeks as she was required to hold this while on Paxlovid when she had COVID in May and resultant GI side effects when she restarted therapy.  She has an appointment with the oncology pharmacist to discuss restarting as her GI symptoms are now resolved.  She remains on letrozole and Lupron. She notes tolerable hot flashes for which she takes Vit E.  She inquires if she should undergo BSO.  She notes normal ROM.  She notes her strength in her upper extremity is not back to baseline.  She has been trying to lose the weight that she gained at the start of chemotherapy.  She states that she still feels puffy. She is satisfied with her reconstruction to date.  She is planning for DIEP reconstruction next March at time of TE exchange.  No other specific complaints today. No other changes to her medical, surgical, family or social history.     Tami Gibson initially presented with significant right breast pain.  Her PCP referred her for imaging evaluation.  Mammography/ultrasound showed a 2 cm spiculated mass in the lower outer right breast as well as at least 3 abnormal axillary nodes. Biopsy revealed right breast IDC, G1, ER/PR+ and HER2 -. At our initial visit, I determined Tami Gibson would need additional workup prior to final surgical planning. She had an enlarged axillary node at this time and was recommended biopsy of the node. This came back as positive for metastatic carcinoma and she was referred for neoadjuvant chemotherapy. She underwent ddAC followed by ddTaxol under the direction of Dr. Laney Pastor completed in October 2021. Given residual disease in the right axilla after neoadjuvant chemotherapy after bilateral SSM, right targeted axillary dissection (TAD) protocol and infusaport removal on 04/20/2020, the patient underwent right ALND with ARM on 05/07/2020.    Diagnosis:    1. 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 Bilateral skin-sparing mastectomy, right targeted axillary dissection (TAD) protocol and infusaport removal, performed on 04/20/2020.  2. Right axillary lymph node dissection with right axillary reverse mapping procedure, performed on 05/07/2020      Clinical Trial Participant:   1. No clinical trials at this time.    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       Medical history reviewed as below. I have also reviewed additional records as provided.  Oncology History Overview Note   30 year old female with recently self palpated right breast lump. A targeted right breast ultrasound on 11/15/2019 showed an irregular mass with spiculated margins at the 7:00 position, 6 cm from the nipple, measuring 1.4 x 1.2 x 1.0 cm. There were 2 abnormal level 1 axillary lymph nodes measuring 1.4 x 1.3 cm and 1.2 x 0.9 cm. Additionally, there was a high level 1/level 2 axillary lymph node, measuring 2.1 x 1.3 cm with an effaced fatty hilum.      Malignant neoplasm of right breast in female, estrogen receptor positive (CMS-HCC)   11/09/2019 Initial Diagnosis    Malignant neoplasm of right breast in female, estrogen receptor positive (CMS-HCC)     11/15/2019 Biopsy    Invasive ductal carcinoma  - Nottingham combined histologic grade: 1  ER 91-100%, PR 91-100%, HER2 negative by IHC    Lymph node, right axilla, core biopsy  - Lymph node positive for metastatic carcinoma,      12/02/2019 -  Cancer Staged    Staging form: Breast, AJCC 8th Edition  - Clinical: Stage IB (cT1c, cN1, cM0, G1, ER+, PR+, HER2-) - Signed by [name], MD on 12/02/2019       12/09/2019 -  Chemotherapy    OP BREAST AC (Dose Dense) q2W X 4 CYCLES, THEN PACLITAXEL (DOSE DENSE) Q2W x 4 CYCLES  DOXOrubicin 60 mg/m2 IV on day 1, cyclophosphamide 600 mg/m2 IV on day 1, every 2 weeks for 4 cycles, then PACLitaxel 175 mg/m2 every 2 weeks for 4 cycles      04/20/2020 Surgery    Breast, right, simple mastectomy  -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 Extracapsular extension is identified      Breast, left, simple mastectomy  -Benign breast tissue with no atypia, in situ or invasive carcinoma identified     04/20/2020 -  Cancer Staged    Staging form: Breast, AJCC 8th Edition  - Pathologic stage from 04/20/2020: No Stage Recommended (ypT1c, pN1a, cM0, G2, ER+, PR+, HER2-) - Signed by [name], MD on 04/25/2020       05/07/2020 Surgery    Lymph nodes, right axillary, dissection  -One of sixteen lymph nodes involved by metastatic adenocarcinoma (1/16)  -  Metastatic focus measures 3 mm in greatest dimension  -No extracapsular extension is identified     06/28/2020 - 08/06/2020 Radiation    The total radiation dose will be 5000 cGy at 200 cGy/fraction for a total of 25 fractions, treated once a day     08/13/2020 Endocrine/Hormone Therapy    Letrozole 2.5mg  +Ovarian suppression (monthly lupron)     Malignant neoplasm of right breast in female, estrogen receptor positive (CMS-HCC)   05/07/2020 Surgery    Lymph nodes, right axillary, dissection  -One of sixteen lymph nodes involved by metastatic adenocarcinoma (1/16)  -Metastatic focus measures 3 mm in greatest dimension  -No extracapsular extension is identified     06/13/2020 -  Radiation    Radiation Therapy Treatment Details (Noted on 06/13/2020)  Site: Right Breast - Overlapping sites  Technique: 3D CRT  Goal: No goal specified  Planned Treatment Start Date: No planned start date specified     06/21/2020 -  Radiation    Radiation Therapy Treatment Details (Noted on 06/21/2020)  Site: Right Breast - Overlapping sites  Technique: 3D CRT  Goal: No goal specified  Planned Treatment Start Date: No planned start date specified       Past Medical History:   Diagnosis Date   ??? Acne     started during teen yrs., Accutane 2008-2009 during college yrs   ??? Anemia     During teen years only, no anemia recently   ??? 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 Gestational hypertension 01/30/2017    end of pregnancy had hypertension, was induced   ??? Headache     migraine occasionally   ??? Hypertension     during week 37 week, pt induced and had C/S due to hypertension   ??? 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 Kidney stone    ??? Malignant neoplasm of right breast in female, 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/30/2019   ??? Pyelonephritis 09/2016    Kidney stones   ??? Urinary tract infection     Had 5-6 in Lifetime, esp high school and college age   ??? Varicella     during childhood   ??? Vitamin B 12 deficiency      Past Surgical History:   Procedure Laterality Date   ??? BREAST BIOPSY Right     IDC   ??? CESAREAN SECTION  01/30/2017    breech   ??? CHEMOTHERAPY     ??? IR INSERT PORT AGE GREATER THAN 5 YRS  12/08/2019    IR INSERT PORT AGE GREATER THAN 5 YRS 12/08/2019 Jobe Gibbon, MD IMG VIR H[facility]   ??? LUMBAR PUNCTURE DIAGNOSTIC [facility] HISTORICAL RESULT)  01/2015    to relieve ICP   ??? PR BX/REMV,LYMPH NODE,DEEP AXILL Right 04/20/2020    Procedure: BX/EXC LYMPH NODE; OPEN, DEEP AXILRY NODE;  Surgeon: Moss Mc, MD;  Location: MAIN OR [facility];  Service: Surgical Oncology Breast   ??? PR CESAREAN DELIVERY ONLY N/A 01/30/2017    Procedure: CESAREAN DELIVERY ONLY;  Surgeon: Asher Muir, MD;  Location: L&D C-SECTION OR SUITES [facility];  Service: Maternal-Fetal Medicine   ??? PR CESAREAN DELIVERY ONLY N/A 02/17/2019    Procedure: CESAREAN DELIVERY ONLY;  Surgeon: Lonny Prude, MD;  Location: L&D C-SECTION OR SUITES [facility];  Service: Maternal-Fetal Medicine   ??? PR IMPLNT BIO IMPLNT FOR SOFT TISSUE REINFORCEMENT Bilateral 04/20/2020    Procedure:  IMPLANTATION BIOLOGIC IMPLANT(EG, ACELLULAR DERMAL MATRIX) FOR SOFT TISSUE REINFORCEMENT(EG, BREAST, TRUNK);  Surgeon: Arsenio Katz, MD;  Location: MAIN OR [facility];  Service: Plastics   ??? PR INTRAOPERATIVE SENTINEL LYMPH NODE ID W DYE INJECTION Right 04/20/2020    Procedure: INTRAOPERATIVE IDENTIFICATION SENTINEL LYMPH NODE(S) INCLUDE INJECTION NON-RADIOACTIVE DYE, WHEN PERFORMED;  Surgeon: Moss Mc, MD;  Location: MAIN OR [place];  Service: Surgical Oncology Breast   ??? PR INTRAOPERATIVE SENTINEL LYMPH NODE ID W DYE INJECTION Right 05/07/2020    Procedure: INTRAOPERATIVE IDENTIFICATION SENTINEL LYMPH NODE(S) INCLUDE INJECTION NON-RADIOACTIVE DYE, WHEN PERFORMED;  Surgeon: Moss Mc, MD;  Location: MAIN OR [place];  Service: Surgical Oncology Breast   ??? PR MASTECTOMY, SIMPLE, COMPLETE Bilateral 04/20/2020    Procedure: MASTECTOMY, SIMPLE, COMPLETE;  Surgeon: Moss Mc, MD;  Location: MAIN OR [place];  Service: Surgical Oncology Breast   ??? PR REMOVE ARMPITS LYMPH NODES COMPLT Right 05/07/2020    Procedure: AXILLARY LYMPHADENECTOMY; COMPLETE;  Surgeon: Moss Mc, MD;  Location: MAIN OR ;  Service: Surgical Oncology Breast   ??? PR TISSUE EXPANDER PLACEMENT BREAST RECONSTRUCTION Bilateral 04/20/2020    Procedure: TISSUE EXPANDER PLACEMENT IN BREAST RECONSTRUCTION, INCLUDING SUBSEQUENT EXPANSION(S);  Surgeon: Arsenio Katz, MD;  Location: MAIN OR [facility];  Service: Plastics   ??? TONSILECTOMY, ADENOIDECTOMY, BILATERAL MYRINGOTOMY AND TUBES     ??? WISDOM TOOTH EXTRACTION  2004     Family History   Problem Relation Age of Onset   ??? Cancer Maternal Aunt 45        breast Ca   ??? Hypertension Father    ??? Other Father 28        Benign brain tumor   ??? Hypothyroidism Mother    ??? Cancer Paternal Aunt         Breast Ca dx in 58s   ??? ALS Maternal Aunt    ??? Ovarian cancer Maternal Grandmother 74   ??? No Known Problems Maternal Grandfather ??? Cancer Paternal Grandmother         Unknown Cancer   ??? No Known Problems Paternal Grandfather    ??? No Known Problems Maternal Aunt    ??? No Known Problems Maternal Aunt    ??? Breast cancer Maternal Cousin         Dx in 1s   ??? No Known Problems Paternal Aunt    ??? No Known Problems Daughter    ??? No Known Problems Daughter    ??? No Known Problems Other    ??? BRCA 1/2 Neg Hx    ??? Colon cancer Neg Hx    ??? Endometrial cancer Neg Hx      Social History     Socioeconomic History   ??? Marital status: Married     Spouse name: Dylan   ??? Number of children: 0   Occupational History   ??? Occupation: Recreational therapist    Tobacco Use   ??? Smoking status: Never Smoker   ??? Smokeless tobacco: Never Used   Vaping Use   ??? Vaping Use: Never used   Substance and Sexual Activity   ??? Alcohol use: Not Currently   ??? Drug use: No   ??? Sexual activity: Yes     Partners: Male     Birth control/protection: Pill   Other Topics Concern   ??? Exercise Yes   ??? Living Situation No   Social History Narrative    PRECONCEPTUAL ASSESSMENT:        Fedora is a  30 y.o. Caucasian female    Caffeine:denies use    Cats: no    Exercise: not active    Diet: balanced    Family hx of of birth defects, chromosomal abnormality, intellectual disability, developmental delay: no.            PARTNER HISTORY:     Tami Gibson is a 69 y.o.  Caucasian female.    Occupation:  Immunologist; [employer]    He has fathered children:  no    He is healthy with no chronic illness    Family history of  birth defects, chromosomal abnormality, intellectual disability, or developmental delay: no.              Social Determinants of Health     Financial Resource Strain: Low Risk    ??? Difficulty of Paying Living Expenses: Not very hard   Food Insecurity: No Food Insecurity   ??? Worried About Running Out of Food in the Last Year: Never true   ??? Ran Out of Food in the Last Year: Never true   Transportation Needs: No Transportation Needs   ??? Lack of Transportation (Medical): No   ??? Lack of Transportation (Non-Medical): No       Current Outpatient Medications:   ???  acetaminophen (TYLENOL) 325 MG tablet, Take 650 mg by mouth every six (6) hours as needed for pain., Disp: , Rfl:   ???  calcium carbonate (TUMS) 200 mg calcium (500 mg) chewable tablet, Chew 1 tablet daily., Disp: , Rfl:   ???  docusate sodium (COLACE) 100 MG capsule, Take 100 mg by mouth nightly as needed (PRN)., Disp: , Rfl:   ???  ergocalciferol-1,250 mcg, 50,000 unit, (DRISDOL) 1,250 mcg (50,000 unit) capsule, Take 1,250 mcg by mouth once a week., Disp: , Rfl:   ???  escitalopram oxalate (LEXAPRO) 10 MG tablet, Take 1 tablet (10 mg total) by mouth in the morning., Disp: 30 tablet, Rfl: 5  ???  letrozole (FEMARA) 2.5 mg tablet, Take 1 tablet (2.5 mg total) by mouth daily., Disp: 90 tablet, Rfl: 3  ???  mometasone (ELOCON) 0.1 % cream, Apply to breast twice daily, Disp: 90 g, Rfl: 1  ???  omeprazole (PRILOSEC) 20 MG capsule, Take 1 capsule (20 mg total) by mouth daily., Disp: 90 capsule, Rfl: 3  ???  ondansetron (ZOFRAN-ODT) 4 MG disintegrating tablet, Take 1 tablet (4 mg total) by mouth every eight (8) hours as needed for nausea., Disp: 30 tablet, Rfl: 3  ???  PAXLOVID CO-PACK, EUA, (NIRMATRELVIR-RITONAVIR) 150 mg x 2- 100 mg tablet, See package instructions., Disp: 30 tablet, Rfl: 0  ???  rimegepant (NURTEC ODT) 75 mg TbDL, Take 75 mg by mouth. , Disp: , Rfl:   ???  SUMAtriptan (IMITREX) 100 MG tablet, TAKE ONE TABLET BY MOUTH AT ONSET OF MIGRAINE FOR UP TO 1 DOSE . IF SYMPTOMS PERSIST A SECOND DOSE MAY BE TAKEN IN 2 HOURS IF NEEDED, Disp: , Rfl:   ???  topiramate (TOPAMAX) 25 MG tablet, TAKE 3 TABLETS BY MOUTH ONCE DAILY, Disp: , Rfl:   ???  topiramate (TOPAMAX) 50 MG tablet, Take 50 mg by mouth Two (2) times a day., Disp: , Rfl:   ???  vitamin E-134 mg, 200 UNIT, 134 mg (200 UNIT) capsule, Take 134 mg by mouth daily., Disp: , Rfl:   ???  abemaciclib (VERZENIO) 150 mg Tab tablet, Take 1 tablet (150 mg total) by mouth Two (2) times a day. Take with or without food. (Patient  not taking: No sig reported), Disp: 56 tablet, Rfl: 5  No current facility-administered medications for this visit.    Facility-Administered Medications Ordered in Other Visits:   ???  leuprolide (LUPRON) 3.75 mg injection, , , ,   No Known Allergies      Physical Examination:   Blood pressure 121/88, pulse 88, temperature 36.1 ??C (97 ??F), temperature source Temporal, resp. rate 18, height 167.6 cm (5' 6), weight 95 kg (209 lb 6.4 oz), SpO2 98 %, not currently breastfeeding.  General Appearance:  No acute distress, well appearing and well nourished.   Head:  Normocephalic, atraumatic.   Eyes:  Conjuctiva and lids appear normal. Pupils equal and round,   sclera anicteric.   Ears:  Overall appearance normal with no scars, lesions or               masses.  Hearing is grossly normal.   Neck: Supple, symmetrical, trachea midline, no adenopathy;   thyroid: no enlargement/tenderness/nodules.   Pulmonary:    Normal respiratory effort.    Musculoskeletal: Normal gait. Extremities without clubbing, cyanosis, or           edema.   Skin: Skin color, texture, turgor normal, no rashes or lesions.   Neurologic: No motor abnormalities noted. Sensation grossly intact.   Lymphatic:  Breast: No cervical or supraclavicular LAD noted.   A comprehensive examination of the breasts and chest wall was performed with the patient upright and supine with arms at her sides and above her head.   Right breast is surgically absent with well healed mastectomy scar. The native nipple is surgically absent. The breast has been reconstructed with implant based reconstruction.  No palpable nodules, skin lesions, erythema, or areas of concern. . infraclavicular and supraclavicular nodal examinations are negative. Arm circumference is increased consistent with lymphedema. Cosmetic outcome is excellent.  Left breast is surgically absent with well healed mastectomy scar. The native nipple is surgically absent. The breast has been reconstructed with implant based reconstruction.  No palpable nodules, skin lesions, erythema, or areas of concern. Marland Kitchen   Psychiatric: Judgement and insight appropriate. Oriented to person,         place, and time.       Imaging Review:  None indicated due to mastectomies.     I have personally reviewed the breast imaging, laboratory values, existing medical records, and pathology. I have summarized these findings and my interpretation in the oncology history and assessment above.    Impression:  Ms. Cutbirth is a 30 y.o. female with a history of right breast infiltrating ductal carcinoma, estrogen receptor positive, progesterone receptor positive and HER2 negative. See treatment summary above.   Staging  Cancer Staging  Malignant neoplasm of right breast in female, estrogen receptor positive (CMS-HCC)  Staging form: Breast, AJCC 8th Edition  - Clinical: Stage IB (cT1c, cN1, cM0, G1, ER+, PR+, HER2-) - Signed by [name], MD on 12/02/2019  - Pathologic stage from 04/20/2020: No Stage Recommended (ypT1c, pN1a, cM0, G2, ER+, PR+, HER2-) - Signed by [name], MD on 04/25/2020    There is no evidence of disease at 6 months.    Recommendations:  1. Continue current therapy as prescribed by medical oncology.   2. Discussed importance of daily stretching despite current normal ROM.  3. Follow-up with PT for lymphedema therapy as scheduled. Continue compression garments and sleeve.  I will reach out to Dr. Lafayette Dragon and Dr. Melodie Bouillon about the feasibility of lymph node transfer or  other surgical intervention for management of her symptoms. I will notify her of their recommendations and if this can be combined with her reconstructive surgery.  4. Continue Vit E for hot flashes. Denies the need for additional intervention  5. Briefly discussed the risks and benefits of BSO in the absence of a pathogenic gene mutation.  She is not interested in any further children. Discussed that this would allow her to stop her Lupron.  She was encouraged to discuss this with medical oncology at her next follow-up appointment.   6. The patient is attempting weight loss from weight gained from her chemotherapy.  She was encouraged to discuss the amount of weight she should/should not lose in the setting of upcoming DIEP reconstruction.     All of the patient's and family's questions and concerns were addressed during the visit and they are in agreement with the plan of care. Please do not hesitate to contact me with questions or concerns.    Cancer Staging  Malignant neoplasm of right breast in female, estrogen receptor positive (CMS-HCC)  Staging form: Breast, AJCC 8th Edition  - Clinical: Stage IB (cT1c, cN1, cM0, G1, ER+, PR+, HER2-) - Signed by [name], MD on 12/02/2019  - Pathologic stage from 04/20/2020: No Stage Recommended (ypT1c, pN1a, cM0, G2, ER+, PR+, HER2-) - Signed by [name], MD on 04/25/2020      Pollyann Samples, NP-C  Surgical Breast Oncology  11/19/2020  9:25 AM

## 2020-11-19 NOTE — Unmapped (Signed)
It was a pleasure to see you today in the Breast Surgical Oncology Clinic. Please call my nurse navigator, Alinda Dooms, if you have any interval questions or concerns.     Follow up in 6 months for exam.  Continue surveillance as outlined. NCCN recommends that you continue to see your primary care provider for all general health care recommended for a patient your age, including cancer screening tests.  Any symptoms should be brought to the attention of your provider.  You should see your medical oncologist every 3 to 6 months for the first 3 years, every 6 to 12 months for years 4 and 5, and annually thereafter. Visits with your surgeon and radiation oncologist should be rotated with your medical oncology team initially until you are released to medical oncology or a survivorship clinic. The surgery visit should typically be paired with the imaging appointment so that we can review mammogram findings and recommendations.     Surgical oncology contact information:  For appointments, please call [phone].     Nurse Navigator available Monday through Friday 8:30 am to 4:30 pm:   Alinda Dooms, RN, MSN   Phone: [phone]    For emergencies, evenings or weekends, please call [phone] and ask for the surgical oncology resident on call. Please be aware that this person is also responding to in-hospital emergencies and patient issues and may not answer your phone call immediately, but will return your call as soon as possible.

## 2020-11-20 ENCOUNTER — Institutional Professional Consult (permissible substitution)
Admit: 2020-11-20 | Discharge: 2020-11-21 | Payer: PRIVATE HEALTH INSURANCE | Attending: Pharmacist | Primary: Pharmacist

## 2020-11-20 NOTE — Unmapped (Signed)
Pharmacist Phone Follow-Up    Cancer Team  Medical Oncology: Dr. Laney Pastor  Reason for call: Oral chemotherapy management  Current treatment: Abemaciclib + Letrozole + Leuprolide depot     Assessment/Plan  Breast cancer:  Ms. 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  She started adjuvant endocrine therapy with leuprolide depot and letrozole and then started abemaciclib on 3/30 per the [facility] trial.    Ms. Tami Gibson was experiencing diarrhea approximately two times daily and controlling it with Imodium.  She was also had nausea that was being controlled with ondansetron ODT.  She was then diagnosed with Covid, started Paxlovid on 10/05/20, and held abemaciclib.  After restarting abemaciclib she started to have severe abdominal pain and very watery stools, especially after eating.  She held abemaciclib for 2 weeks to see if her symptoms improved.    After holding abemaciclib x 2 weeks, she reports that her diarrhea has completely resolved.  She will rechallenge abemaciclib 150 mg BID starting 11/21/20.  If her symptoms of severe abdominal pain/watery stools return, I recommend the dose of abemaciclib be reduced to 100 mg BID.     Plan:  1. Restart abemaciclib 150 mg BID  2. CPP follow-up 11/28/20  ________________________________________________________________________     Interval History   Ms.Tami Gibson is a 30 y.o. female with ER+/PR+/HER2- breast cancer who I am calling to follow-up on tolerance to abemaciclib. Ms. Blakeney was diagnosed with Covid early May and was prescribed Paxlovid on 5/6.  She held abemaciclib while on Paxlovid.  Prior to getting Covid, she would have loose stools approximately twice a day.  She did not have pain and the diarrhea was tolerable.  After Covid, she had severe, intense abdominal pain and then has pure liquid stools.  After holding abemaciclib she started to notice resolution of her symptoms after 3 days.  She is now having normal stools.    Oral chemotherapy:  Abemaciclib 150 mg BID  Start date: 08/29/20  Pharmacy: [facility]     Patient verbalized understanding of the above information.     I spent 15 minutes on the phone with the patient on the date of service. I spent an additional 10 minutes on pre- and post-visit activities.     The patient was physically located in [place] or a state in which I am permitted to provide care. The patient and/or parent/guardian understood that s/he may incur co-pays and cost sharing, and agreed to the telemedicine visit. The visit was reasonable and appropriate under the circumstances given the patient's presentation at the time.    The patient and/or parent/guardian has been advised of the potential risks and limitations of this mode of treatment (including, but not limited to, the absence of in-person examination) and has agreed to be treated using telemedicine. The patient's/patient's family's questions regarding telemedicine have been answered.     If the visit was completed in an ambulatory setting, the patient and/or parent/guardian has also been advised to contact their provider???s office for worsening conditions, and seek emergency medical treatment and/or call 911 if the patient deems either necessary.        Oncology History Overview Note   30 year old female with recently self palpated right breast lump. A targeted right breast ultrasound on 11/15/2019 showed an irregular mass with spiculated margins at the 7:00 position, 6 cm from the nipple, measuring 1.4 x  1.2 x 1.0 cm. There were 2 abnormal level 1 axillary lymph nodes measuring 1.4 x 1.3 cm and 1.2 x 0.9 cm. Additionally, there was a high level 1/level 2 axillary lymph node, measuring 2.1 x 1.3 cm with an effaced fatty hilum.      Malignant neoplasm of right breast in female, estrogen receptor positive (CMS-HCC)   11/09/2019 Initial Diagnosis    Malignant neoplasm of right breast in female, estrogen receptor positive (CMS-HCC)     11/15/2019 Biopsy    Invasive ductal carcinoma  - Nottingham combined histologic grade: 1  ER 91-100%, PR 91-100%, HER2 negative by IHC    Lymph node, right axilla, core biopsy  - Lymph node positive for metastatic carcinoma,      12/02/2019 -  Cancer Staged    Staging form: Breast, AJCC 8th Edition  - Clinical: Stage IB (cT1c, cN1, cM0, G1, ER+, PR+, HER2-) - Signed by [name], MD on 12/02/2019       12/09/2019 -  Chemotherapy    OP BREAST AC (Dose Dense) q2W X 4 CYCLES, THEN PACLITAXEL (DOSE DENSE) Q2W x 4 CYCLES  DOXOrubicin 60 mg/m2 IV on day 1, cyclophosphamide 600 mg/m2 IV on day 1, every 2 weeks for 4 cycles, then PACLitaxel 175 mg/m2 every 2 weeks for 4 cycles      04/20/2020 Surgery    Breast, right, simple mastectomy  -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 Extracapsular extension is identified      Breast, left, simple mastectomy  -Benign breast tissue with no atypia, in situ or invasive carcinoma identified     04/20/2020 -  Cancer Staged    Staging form: Breast, AJCC 8th Edition  - Pathologic stage from 04/20/2020: No Stage Recommended (ypT1c, pN1a, cM0, G2, ER+, PR+, HER2-) - Signed by [name], MD on 04/25/2020       05/07/2020 Surgery    Lymph nodes, right axillary, dissection  -One of sixteen lymph nodes involved by metastatic adenocarcinoma (1/16)  -Metastatic focus measures 3 mm in greatest dimension  -No extracapsular extension is identified     06/28/2020 - 08/06/2020 Radiation    The total radiation dose will be 5000 cGy at 200 cGy/fraction for a total of 25 fractions, treated once a day     08/13/2020 Endocrine/Hormone Therapy    Letrozole 2.5mg  +Ovarian suppression (monthly lupron)     Malignant neoplasm of right breast in female, estrogen receptor positive (CMS-HCC)   05/07/2020 Surgery    Lymph nodes, right axillary, dissection  -One of sixteen lymph nodes involved by metastatic adenocarcinoma (1/16)  -Metastatic focus measures 3 mm in greatest dimension  -No extracapsular extension is identified     06/13/2020 -  Radiation    Radiation Therapy Treatment Details (Noted on 06/13/2020)  Site: Right Breast - Overlapping sites  Technique: 3D CRT  Goal: No goal specified  Planned Treatment Start Date: No planned start date specified     06/21/2020 -  Radiation    Radiation Therapy Treatment Details (Noted on 06/21/2020)  Site: Right Breast - Overlapping sites  Technique: 3D CRT  Goal: No goal specified  Planned Treatment Start Date: No planned start date specified          Pertinent Labs:   No visits with results within 1 Day(s) from this visit.   Latest known visit with  results is:   Lab on 10/31/2020   Component Date Value Ref Range Status   ??? Sodium 10/31/2020 136  135 - 145 mmol/L Final   ??? Potassium 10/31/2020 3.8  3.4 - 4.8 mmol/L Final   ??? Chloride 10/31/2020 107  98 - 107 mmol/L Final   ??? CO2 10/31/2020 24.0  20.0 - 31.0 mmol/L Final   ??? Anion Gap 10/31/2020 5  5 - 14 mmol/L Final   ??? BUN 10/31/2020 16  9 - 23 mg/dL Final   ??? Creatinine 10/31/2020 1.02 (A) 0.60 - 0.80 mg/dL Final   ??? BUN/Creatinine Ratio 10/31/2020 16   Final   ??? eGFR CKD-EPI (2021) Female 10/31/2020 76  >=60 mL/min/1.20m2 Final    eGFR calculated with CKD-EPI 2021 equation in accordance with [employer] and [employer] of Nephrology Task Force recommendations.   ??? Glucose 10/31/2020 82  70 - 179 mg/dL Final   ??? Calcium 16/03/9603 9.9  8.7 - 10.4 mg/dL Final   ??? Albumin 54/01/8118 3.8  3.4 - 5.0 g/dL Final   ??? Total Protein 10/31/2020 7.3  5.7 - 8.2 g/dL Final   ??? Total Bilirubin 10/31/2020 0.5  0.3 - 1.2 mg/dL Final   ??? AST 14/78/2956 16  <=34 U/L Final   ??? ALT 10/31/2020 12  10 - 49 U/L Final   ??? Alkaline Phosphatase 10/31/2020 117 (A) 46 - 116 U/L Final   ??? WBC 10/31/2020 3.4 (A) 3.6 - 11.2 10*9/L Final   ??? RBC 10/31/2020 4.00  3.95 - 5.13 10*12/L Final   ??? HGB 10/31/2020 10.6 (A) 11.3 - 14.9 g/dL Final   ??? HCT 21/30/8657 31.2 (A) 34.0 - 44.0 % Final   ??? MCV 10/31/2020 78.1  77.6 - 95.7 fL Final   ??? MCH 10/31/2020 26.6  25.9 - 32.4 pg Final   ??? MCHC 10/31/2020 34.0  32.0 - 36.0 g/dL Final   ??? RDW 84/69/6295 23.2 (A) 12.2 - 15.2 % Final   ??? MPV 10/31/2020 7.1  6.8 - 10.7 fL Final   ??? Platelet 10/31/2020 154  150 - 450 10*9/L Final   ??? Neutrophils % 10/31/2020 71.0  % Final   ??? Lymphocytes % 10/31/2020 17.9  % Final   ??? Monocytes % 10/31/2020 9.9  % Final   ??? Eosinophils % 10/31/2020 0.7  % Final   ??? Basophils % 10/31/2020 0.5  % Final   ??? Absolute Neutrophils 10/31/2020 2.4  1.8 - 7.8 10*9/L Final   ??? Absolute Lymphocytes 10/31/2020 0.6 (A) 1.1 - 3.6 10*9/L Final   ??? Absolute Monocytes 10/31/2020 0.3  0.3 - 0.8 10*9/L Final   ??? Absolute Eosinophils 10/31/2020 0.0  0.0 - 0.5 10*9/L Final   ??? Absolute Basophils 10/31/2020 0.0  0.0 - 0.1 10*9/L Final   ??? Microcytosis 10/31/2020 Slight (A) Not Present Final   ??? Anisocytosis 10/31/2020 Marked (A) Not Present Final   ??? Hypochromasia 10/31/2020 Slight (A) Not Present Final   ??? Bilirubin, Direct 10/31/2020 0.10  0.00 - 0.30 mg/dL Final   ??? Smear Review Comments 10/31/2020 See Comment (A) Undefined Final    Slide reviewed.      ??? Burr Cells 10/31/2020 Present (A) Not Present Final   ??? Vitamin B-12 10/31/2020 277  211 - 911 pg/ml Final       Current medications:     Current Outpatient Medications   Medication Sig Dispense Refill   ??? abemaciclib (VERZENIO) 150 mg Tab tablet Take 1 tablet (150 mg  total) by mouth Two (2) times a day. Take with or without food. (Patient not taking: No sig reported) 56 tablet 5   ??? acetaminophen (TYLENOL) 325 MG tablet Take 650 mg by mouth every six (6) hours as needed for pain.     ??? calcium carbonate (TUMS) 200 mg calcium (500 mg) chewable tablet Chew 1 tablet daily.     ??? docusate sodium (COLACE) 100 MG capsule Take 100 mg by mouth nightly as needed (PRN).     ??? ergocalciferol-1,250 mcg, 50,000 unit, (DRISDOL) 1,250 mcg (50,000 unit) capsule Take 1,250 mcg by mouth once a week.     ??? escitalopram oxalate (LEXAPRO) 10 MG tablet Take 1 tablet (10 mg total) by mouth in the morning. 30 tablet 5   ??? letrozole (FEMARA) 2.5 mg tablet Take 1 tablet (2.5 mg total) by mouth daily. 90 tablet 3   ??? mometasone (ELOCON) 0.1 % cream Apply to breast twice daily 90 g 1   ??? omeprazole (PRILOSEC) 20 MG capsule Take 1 capsule (20 mg total) by mouth daily. 90 capsule 3   ??? ondansetron (ZOFRAN-ODT) 4 MG disintegrating tablet Take 1 tablet (4 mg total) by mouth every eight (8) hours as needed for nausea. 30 tablet 3   ??? PAXLOVID CO-PACK, EUA, (NIRMATRELVIR-RITONAVIR) 150 mg x 2- 100 mg tablet See package instructions. 30 tablet 0   ??? rimegepant (NURTEC ODT) 75 mg TbDL Take 75 mg by mouth.      ??? SUMAtriptan (IMITREX) 100 MG tablet TAKE ONE TABLET BY MOUTH AT ONSET OF MIGRAINE FOR UP TO 1 DOSE . IF SYMPTOMS PERSIST A SECOND DOSE MAY BE TAKEN IN 2 HOURS IF NEEDED     ??? topiramate (TOPAMAX) 25 MG tablet TAKE 3 TABLETS BY MOUTH ONCE DAILY     ??? topiramate (TOPAMAX) 50 MG tablet Take 50 mg by mouth Two (2) times a day.     ??? vitamin E-134 mg, 200 UNIT, 134 mg (200 UNIT) capsule Take 134 mg by mouth daily.       No current facility-administered medications for this visit.     Facility-Administered Medications Ordered in Other Visits   Medication Dose Route Frequency Provider Last Rate Last Admin   ??? leuprolide (LUPRON) 3.75 mg injection

## 2020-11-21 ENCOUNTER — Telehealth: Admit: 2020-11-21 | Discharge: 2020-11-22 | Payer: PRIVATE HEALTH INSURANCE | Attending: Clinical | Primary: Clinical

## 2020-11-21 DIAGNOSIS — C50911 Malignant neoplasm of unspecified site of right female breast: Principal | ICD-10-CM

## 2020-11-21 DIAGNOSIS — I890 Lymphedema, not elsewhere classified: Principal | ICD-10-CM

## 2020-11-21 DIAGNOSIS — Z170 Estrogen receptor positive status [ER+]: Principal | ICD-10-CM

## 2020-11-22 ENCOUNTER — Ambulatory Visit
Admit: 2020-11-22 | Discharge: 2020-12-09 | Payer: PRIVATE HEALTH INSURANCE | Attending: Rehabilitative and Restorative Service Providers" | Primary: Rehabilitative and Restorative Service Providers"

## 2020-11-22 NOTE — Unmapped (Signed)
[facility]  Comprehensive Cancer Support Program   Telehealth Encounter    *non billable encounter*      Encounter Description: This encounter was conducted via live, face-to-face video conference in the setting of State of Emergency due to COVID-19 Pandemic.     The patient reports they are currently: not at home. I spent 50 minutes on the real-time audio and video with the patient on the date of service. I spent an additional 15 minutes on pre- and post-visit activities on the date of service.     The patient was physically located in [place] or a state in which I am permitted to provide care. The patient and/or parent/guardian understood that s/he may incur co-pays and cost sharing, and agreed to the telemedicine visit. The visit was reasonable and appropriate under the circumstances given the patient's presentation at the time.    The patient and/or parent/guardian has been advised of the potential risks and limitations of this mode of treatment (including, but not limited to, the absence of in-person examination) and has agreed to be treated using telemedicine. The patient's/patient's family's questions regarding telemedicine have been answered.     If the visit was completed in an ambulatory setting, the patient and/or parent/guardian has also been advised to contact their provider???s office for worsening conditions, and seek emergency medical treatment and/or call 911 if the patient deems either necessary.      Assessment:  Tami Gibson is a 30 y.o. female with a history of breast cancer. Referred by Luetta Nutting, PT for support navigating survivorship and long term impact of treatment (lymphedema). She describes a normal and healthy emotional response to treatment and is interested in engaging in therapy as she transitions from active treatment to survivorship.     Risk Assessment:  A suicide and violence risk assessment was performed as part of this evaluation. There is no acute risk for suicide or violence at this time.  While future psychiatric events cannot be accurately predicted, the patient does not currently require acute inpatient psychiatric care and does not currently meet [facility] involuntary commitment criteria.       Plan:  Will continue to follow for supportive counseling.      Subjective:   Patient interviewed in a private place, accompanied by no one. She shared updates since our last visit including her year anniversary of her diagnosis. [place] and enjoyed the space for quiet reflection, grief, and rest. Has been especially busy lately with multiple doctors appointments. Notes that it's difficult to manage taking time off her her medical appointments, working full time, and parenting two young children. Has been taking lexapro since the beginning of June and feels that it is helping to keep her stress more manageable. Has been using compression device at night and thinks this is also helping with swelling from lymphedema. Feels daunted by the ongoing care and treatment needed to manage her lymphedema. Has appt in July for consult for surgery to treat lymphedema and is hopeful this may help. Provided supportive counseling, active listening, normalization, and psychoeducation. Pt engaged and open to meeting again.       Objective:    Mental Status Exam:  Speech/Language:    Normal rate, volume, tone, fluency   Mood:   euthymic   Thought process and Associations:   Logical, linear, clear, coherent, goal directed   Abnormal/psychotic thought content:     Denies SI, HI, self harm,  delusions, obsessions, paranoid ideation, or ideas of reference   Perceptual disturbances:     Does not endorse auditory or visual hallucinations     Orientation:   Oriented to person, place, time, and general circumstances   Insight:     Intact   Judgment:    Intact   Impulse Control:   Intact     [name], LCSW  Comprehensive Cancer Support Program  Phone: [phone]  Pager: [phone]

## 2020-11-22 NOTE — Unmapped (Signed)
[facility] Refill Coordination Note    Specialty Medication(s) to be Shipped:   Hematology/Oncology: Verzenio 150mg     Other medication(s) to be shipped: No additional medications requested for fill at this time     Tami Gibson, DOB: [birth year]  Phone: [phone] (home)       All above HIPAA information was verified with patient.     Was a Nurse, learning disability used for this call? No    Completed refill call assessment today to schedule patient's medication shipment from the [facility] [phone]).  All relevant notes have been reviewed.     Specialty medication(s) and dose(s) confirmed: Regimen is correct and unchanged.   Changes to medications: [name] reports no changes at this time.  Changes to insurance: No  New side effects reported not previously addressed with a pharmacist or physician: None reported  Questions for the pharmacist: No    Confirmed patient received a Conservation officer, historic buildings and a Surveyor, mining with first shipment. The patient will receive a drug information handout for each medication shipped and additional FDA Medication Guides as required.       DISEASE/MEDICATION-SPECIFIC INFORMATION        N/A    SPECIALTY MEDICATION ADHERENCE     Medication Adherence    Patient reported X missed doses in the last month: 0  Specialty Medication: Verzenio 150mg   Patient is on additional specialty medications: No  Informant: patient              Were doses missed due to medication being on hold? No    Verzenio 150 mg: 8 days of medicine on hand       REFERRAL TO PHARMACIST     Referral to the pharmacist: Not needed      [facility]     Shipping address confirmed in Epic.     Delivery Scheduled: Yes, Expected medication delivery date: 11/27/20.     Medication will be delivered via Next Day Courier to the prescription address in Epic WAM.    Tami Gibson   [facility] Specialty Technician

## 2020-11-22 NOTE — Unmapped (Signed)
[facility] THERAPY SERVICES [place]  OUTPATIENT PHYSICAL THERAPY  11/22/2020  Note Type: Treatment Note       Patient Name: Tami Gibson  Date of Birth:1991-05-19  Diagnosis:   Encounter Diagnoses   Name Primary?   ??? Lymphedema syndrome, postmastectomy Yes   ??? Scar condition and fibrosis of skin    ??? Malignant neoplasm of right breast in female, estrogen receptor positive, unspecified site of breast (CMS-HCC)      Referring MD:  Jobe Marker*     Date of Onset of Impairment-10/20/2020  Date PT Care Plan Established or Reviewed-10/30/2020  Date PT Treatment Started-10/30/2020   Plan of Care Effective Date: 10-30-20 to 01-28-21      Session #:  3      Contraindications:  none  Precautions:  none  Red Flags:  history of cancer, right breast IB    Metastatic cancer:   no      ASSESSMENT/Progress toward goals:  Pt is progressing toward meeting goals and Patient's skin integrity is improving.  Skin is softening with treatment which allows for improved lymph uptake, and decreased risk of infection Has gotten Jobst Bella Strong 20-30 sleeve and glove, RTW.  Using night garment and pump.  Mild swelling remains, but skin is soft.  Very mild cording.  Pt is doing an excellent job at self care.  Taping to hand effective in reduction.      Recommendations for treatment/garments:  1) Patient will obtain proper garments to address swelling and improve tissue integrity. Garment recommendations : Mediven Harmony CCL 2 sleeve and glove, Jovi pak night time compression  2) Pt will be seen clinically for rehabilitation to include treatments to address as needed: edema/lymphedema, pain, ROM, strength, balance and endurance deficits, as well as learning self care strategies to address these deficits  3) Pt will require a pneumatic compression pump because despite conservative treatment VH:[id number] garments or devices, exercise, elevation, skin care and self MLD,the patient exhibits the following symptoms:hyperplasia, hyperpigmentation, fibrosis, truncal/abdominal swelling, chest/axillary swelling and unable to control swelling    Patient requires skilled Physical Therapy services  for the following problem list and secondary functional limitations:    Problem List:   Uncontrolled swelling and/or lack of appropriate compression garment    Secondary Functional Limitations:  Uncontrolled swelling can increase chances of  infection, and limit functional ROM     Patient Goals: Decrease swelling and use as many devices as needed for self care so that I get this under control    Physical Therapy Goals:   In 1-3 months: Pt and/or caregiver will:  Be able to demonstrate self MLD, donning/doffing garments to manage swelling   Decrease limb volume by 1-3 % for proper fit of clothing and to minimize infection risk    In 6-12 months: Pt and/or caregiver will:  Retain an optimal limb volume that allows pt to fit into appropriate compression garments and clothing to improve functional use of UE and QOL    Prognosis for goal achievement:   Good due to previous success.      PLAN:   Within a 90 day certification period, schedule permitting, decreasing frequency as able, patient will participate in the following as needed: Manual lymphatic drainage, compression bandaging or use of reduction kits, appropriate garment recommendations, skin care and therapeutic exercise, manual therapy, low level laser therapy, lymphatouch, taping, ROM and strength training, education on posture and body mechanics,pump/equipment recommendations, balance exercises,endurance exercises, and strategies to assist with sensation deficits and pain reduction.  Planned frequency and duration  of treatment:   1 x week x 10 weeks    Next Visit Plan:  Take circ, MLD, MFR, lymphatouch, taping to hand prn  pump check  Pt to continue with current plan of care to educate in self care, precautions, and management of symptoms, optimal edema reduction/maintenance of girth, soft tissue changes, fitting of appropriate compression garments, and progression of exercises to optimize functional ROM, strength, balance and endurance in all ADL's(home, work, recreational, community)    SUBJECTIVE:  Patient???s communication preference: verbal, written, visual, prn   History of Present Illness/ Pt reports:  I've been doing everything, day and night sleeve, the pump etc.      Location of pain: No pain        OBJECTIVE:      Posture/Observations:  Forward head, rounded shoulders     Range of Motion/Flexibilty:   UE WFL   Axillary Web Syndrome location: no     Strength/MMT:   UE WFL        Girth Measurements: taken in cm    Date: 5-31 5-31 6-23 6-23          Right Left RIght Left Right Left RIght Left RIght Left   MCP 16.8 16.5 19 18          Mid palm 18 18 19  18.5         Thumb DIP 6            1st finger DIP 4.9            1st finger PIP 6            2nd finger DIP 4.9            2nd finger PIP 5.9            3rd finger DIP 4.5            3rd finger PIP 5.3            4th finger DIP 4.0            4th finger PIP 4.5                         Axilla             Mid breast nipple line             Ribs below breast             Natural Waistline             Umbilicus             Hips at  cm below umbilicus             Limb Volume                        Location of swelling:  right hand , wrist, forearm, elbow, arm, shoulder, axilla and chest    Skin condition:  Stemmer???s sign:no.  increased skin thickness and fibrosis, softening with treatment  Incision/Scar:   incision healing well postoperatively    Sensation :  Peripheral neuropathy:no  Light touch:     Decreased sensation under axilla, around scar, and posterior lateral upper arm         Total Treatment Time: 53 min  Treatment Rendered:    Patient and/or caregiver and therapist were mask compliant per current [place] COVID mask policy  during the entire treatment session.      Manual x 53 min   Taping and MLD to appropriate anastomoses in order to decrease swelling.  Application of compression garment/devices prn to prevent re-accumulation of fluid. hand taping          Equipment provided/recommended:   kinesio tape hand    Communication/consultation with other professionals:  N/A    Referrals made to the following providers:  none    Medical Necessity: This treatment is medically necessary throughout the course of this patient's life during and after cancer treatments to improve functional activities and /or to minimize the decline of functional abilities and worsening of symptoms,including infection and recurrent hospitalizations, through independent/home management strategies.  Lymphedema, a chronic, progressive condition for which there is no cure, is marked by the accumulation of protein-rich fluid in one or more quadrants of the body due to primary or secondary disruption of the lymphatic system.  The sustained accumulation results in tissue inflammation, an increase in fatty tissue, and development of obstructive connective tissue.  These changes may result in an increased risk of infection, disfigurement and a decrease in mobility and functional performance.   Pt will benefit from physical therapy by a certified lymphedema therapist to address : education in self care, precautions, and management of symptoms, optimal edema reduction/maintenance of girth, optimal soft tissue changes, fitting of appropriate compression garments, and progression of exercises to optimize functional ROM and strength, balance, and endurance in all ADL's(home, work, recreational, community).      I attest that I have reviewed the above information.  Signed: Clementeen Graham, PT   11/22/2020 2:16 PM

## 2020-11-26 MED FILL — VERZENIO 150 MG TABLET: ORAL | 28 days supply | Qty: 56 | Fill #3

## 2020-11-28 ENCOUNTER — Institutional Professional Consult (permissible substitution): Admit: 2020-11-28 | Discharge: 2020-11-29 | Payer: PRIVATE HEALTH INSURANCE

## 2020-11-28 MED ADMIN — leuprolide (LUPRON) injection 3.75 mg: 3.75 mg | INTRAMUSCULAR | @ 20:00:00 | Stop: 2020-11-28

## 2020-11-28 NOTE — Unmapped (Signed)
Pt received lupron 3.75mg  injection in right ventroGluteus. Tolerated it well. Applied guaze and band aid.

## 2020-11-29 DIAGNOSIS — Z170 Estrogen receptor positive status [ER+]: Principal | ICD-10-CM

## 2020-11-29 DIAGNOSIS — C50911 Malignant neoplasm of unspecified site of right female breast: Principal | ICD-10-CM

## 2020-11-29 MED ORDER — ABEMACICLIB 100 MG TABLET
ORAL_TABLET | Freq: Two times a day (BID) | ORAL | 5 refills | 28 days | Status: CP
Start: 2020-11-29 — End: ?
  Filled 2020-12-05: qty 56, 28d supply, fill #0

## 2020-11-30 NOTE — Unmapped (Signed)
Clinical Assessment Needed For: Dose Change  Medication: Verzenio  Last Fill Date/Day Supply: 11/26/20 / 28  Copay $0  Was previous dose already scheduled to fill: No    Notes to Pharmacist: None

## 2020-11-30 NOTE — Unmapped (Signed)
Pharmacist Phone Follow-Up    Cancer Team  Medical Oncology: Dr. 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  She started adjuvant endocrine therapy with leuprolide depot and letrozole and then started abemaciclib on 3/30 per the [facility] trial.    Tami Gibson was experiencing diarrhea approximately two times daily and controlling it with Imodium.  She also had nausea that was being controlled with ondansetron ODT.  She was then diagnosed with Covid, started Paxlovid on 10/05/20, and held abemaciclib.  After restarting abemaciclib she started to have severe abdominal pain and very watery stools, especially after eating.  She held abemaciclib for 2 weeks to see if her symptoms improved.  Her symptoms resolved and she restarted abemaciclib 150 mg BID.      After restarting abemaciclib, she reports that she again has diarrhea, approximately 3-4 stools daily (without using Imodium).  She also has abdominal cramping 1-2 times daily.  Her GI adverse effects have improved with the 2 week holiday but they are still worse then before having Covid.  Will dose reduce her abemaciclib to 100 mg BID to improve tolerance.     Plan:  1. Reduce abemaciclib to 100 mg BID.  Prescription sent to pharmacy.  2. Continue Imodium prn diarrhea  3. 12/12/20 MD/Lab appointments  ________________________________________________________________________     Interval History   Tami Gibson is a 30 y.o. female with ER+/PR+/HER2- breast cancer who I am calling to follow-up on tolerance to abemaciclib. Ms. Gelber was diagnosed with Covid early May and was prescribed Paxlovid on 5/6.  She held abemaciclib while on Paxlovid.  Prior to getting Covid, she would have loose stools approximately twice a day.  She did not have pain and the diarrhea was tolerable.  After Covid, she had severe, intense abdominal pain and then has pure liquid stools.  After holding abemaciclib she started to notice resolution of her symptoms after 3 days and then her symptoms completely resolved.      After restarting abemaciclib 150 mg BID, she is now having 3-4 loose stools daily.  She is not using Imodium, per her choice.  I did review importance of using Imodium.  She is also having abdominal pain 1-2 times per day.  The pain starts in her abdomen and extends to her back.  She rates this pain, on average, a 3 or 4 out of 10.  Before the 2 week drug holiday the pain was a 10 out of 10.  Sometimes the pain wakes her up at night.      Per conversation with Dr. Laney Pastor, we will reduce her dose of abemaciclib to see if this improves her GI symptoms.    Oral chemotherapy:     11/29/20: Abemacilcib 100 mg BID (dose reduced for GI AE)   08/29/20: Abemaciclib 150 mg BID  Start date: 08/29/20  Pharmacy: [facility]     Patient verbalized understanding of the above information.     I spent 15 minutes on the phone with the patient on the date of service. I spent an additional 10 minutes on pre- and post-visit activities.     The patient was physically located in [place] or a state in which I am permitted to provide care. The  patient and/or parent/guardian understood that s/he may incur co-pays and cost sharing, and agreed to the telemedicine visit. The visit was reasonable and appropriate under the circumstances given the patient's presentation at the time.    The patient and/or parent/guardian has been advised of the potential risks and limitations of this mode of treatment (including, but not limited to, the absence of in-person examination) and has agreed to be treated using telemedicine. The patient's/patient's family's questions regarding telemedicine have been answered.     If the visit was completed in an ambulatory setting, the patient and/or parent/guardian has also been advised to contact their provider???s office for worsening conditions, and seek emergency medical treatment and/or call 911 if the patient deems either necessary.        Oncology History Overview Note   30 year old female with recently self palpated right breast lump. A targeted right breast ultrasound on 11/15/2019 showed an irregular mass with spiculated margins at the 7:00 position, 6 cm from the nipple, measuring 1.4 x 1.2 x 1.0 cm. There were 2 abnormal level 1 axillary lymph nodes measuring 1.4 x 1.3 cm and 1.2 x 0.9 cm. Additionally, there was a high level 1/level 2 axillary lymph node, measuring 2.1 x 1.3 cm with an effaced fatty hilum.      Malignant neoplasm of right breast in female, estrogen receptor positive (CMS-HCC)   11/09/2019 Initial Diagnosis    Malignant neoplasm of right breast in female, estrogen receptor positive (CMS-HCC)     11/15/2019 Biopsy    Invasive ductal carcinoma  - Nottingham combined histologic grade: 1  ER 91-100%, PR 91-100%, HER2 negative by IHC    Lymph node, right axilla, core biopsy  - Lymph node positive for metastatic carcinoma,      12/02/2019 -  Cancer Staged    Staging form: Breast, AJCC 8th Edition  - Clinical: Stage IB (cT1c, cN1, cM0, G1, ER+, PR+, HER2-) - Signed by [name], MD on 12/02/2019       12/09/2019 -  Chemotherapy    OP BREAST AC (Dose Dense) q2W X 4 CYCLES, THEN PACLITAXEL (DOSE DENSE) Q2W x 4 CYCLES  DOXOrubicin 60 mg/m2 IV on day 1, cyclophosphamide 600 mg/m2 IV on day 1, every 2 weeks for 4 cycles, then PACLitaxel 175 mg/m2 every 2 weeks for 4 cycles      04/20/2020 Surgery    Breast, right, simple mastectomy  -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 Extracapsular extension is identified      Breast, left, simple mastectomy  -Benign breast tissue with no atypia, in situ or invasive carcinoma identified     04/20/2020 -  Cancer Staged    Staging form: Breast, AJCC 8th Edition  - Pathologic stage from 04/20/2020: No Stage Recommended (ypT1c, pN1a, cM0, G2, ER+, PR+, HER2-) - Signed by [name], MD on 04/25/2020       05/07/2020 Surgery    Lymph nodes, right axillary, dissection  -One of sixteen lymph nodes involved by metastatic adenocarcinoma (1/16)  -Metastatic focus measures 3 mm in greatest dimension  -No extracapsular extension is identified     06/28/2020 - 08/06/2020 Radiation    The total radiation dose will be 5000 cGy at 200 cGy/fraction for a total of 25 fractions, treated once a day     08/13/2020 Endocrine/Hormone Therapy    Letrozole  2.5mg  +Ovarian suppression (monthly lupron)     Malignant neoplasm of right breast in female, estrogen receptor positive (CMS-HCC)   05/07/2020 Surgery    Lymph nodes, right axillary, dissection  -One of sixteen lymph nodes involved by metastatic adenocarcinoma (1/16)  -Metastatic focus measures 3 mm in greatest dimension  -No extracapsular extension is identified     06/13/2020 -  Radiation    Radiation Therapy Treatment Details (Noted on 06/13/2020)  Site: Right Breast - Overlapping sites  Technique: 3D CRT  Goal: No goal specified  Planned Treatment Start Date: No planned start date specified     06/21/2020 -  Radiation    Radiation Therapy Treatment Details (Noted on 06/21/2020)  Site: Right Breast - Overlapping sites  Technique: 3D CRT  Goal: No goal specified  Planned Treatment Start Date: No planned start date specified          Pertinent Labs:   No visits with results within 1 Day(s) from this visit.   Latest known visit with results is:   Lab on 10/31/2020   Component Date Value Ref Range Status   ??? Sodium 10/31/2020 136  135 - 145 mmol/L Final   ??? Potassium 10/31/2020 3.8  3.4 - 4.8 mmol/L Final   ??? Chloride 10/31/2020 107  98 - 107 mmol/L Final   ??? CO2 10/31/2020 24.0  20.0 - 31.0 mmol/L Final   ??? Anion Gap 10/31/2020 5  5 - 14 mmol/L Final   ??? BUN 10/31/2020 16  9 - 23 mg/dL Final   ??? Creatinine 10/31/2020 1.02 (A) 0.60 - 0.80 mg/dL Final   ??? BUN/Creatinine Ratio 10/31/2020 16   Final   ??? eGFR CKD-EPI (2021) Female 10/31/2020 76  >=60 mL/min/1.34m2 Final    eGFR calculated with CKD-EPI 2021 equation in accordance with [employer] and [employer] of Nephrology Task Force recommendations.   ??? Glucose 10/31/2020 82  70 - 179 mg/dL Final   ??? Calcium 16/03/9603 9.9  8.7 - 10.4 mg/dL Final   ??? Albumin 54/01/8118 3.8  3.4 - 5.0 g/dL Final   ??? Total Protein 10/31/2020 7.3  5.7 - 8.2 g/dL Final   ??? Total Bilirubin 10/31/2020 0.5  0.3 - 1.2 mg/dL Final   ??? AST 14/78/2956 16  <=34 U/L Final   ??? ALT 10/31/2020 12  10 - 49 U/L Final   ??? Alkaline Phosphatase 10/31/2020 117 (A) 46 - 116 U/L Final   ??? WBC 10/31/2020 3.4 (A) 3.6 - 11.2 10*9/L Final   ??? RBC 10/31/2020 4.00  3.95 - 5.13 10*12/L Final   ??? HGB 10/31/2020 10.6 (A) 11.3 - 14.9 g/dL Final   ??? HCT 21/30/8657 31.2 (A) 34.0 - 44.0 % Final   ??? MCV 10/31/2020 78.1  77.6 - 95.7 fL Final   ??? MCH 10/31/2020 26.6  25.9 - 32.4 pg Final   ??? MCHC 10/31/2020 34.0  32.0 - 36.0 g/dL Final   ??? RDW 84/69/6295 23.2 (A) 12.2 - 15.2 % Final   ??? MPV 10/31/2020 7.1  6.8 - 10.7 fL Final   ??? Platelet 10/31/2020 154  150 - 450 10*9/L Final   ??? Neutrophils % 10/31/2020 71.0  % Final   ??? Lymphocytes % 10/31/2020 17.9  % Final   ??? Monocytes % 10/31/2020 9.9  % Final   ??? Eosinophils % 10/31/2020 0.7  % Final   ??? Basophils % 10/31/2020 0.5  % Final   ??? Absolute Neutrophils 10/31/2020 2.4  1.8 - 7.8 10*9/L  Final   ??? Absolute Lymphocytes 10/31/2020 0.6 (A) 1.1 - 3.6 10*9/L Final   ??? Absolute Monocytes 10/31/2020 0.3  0.3 - 0.8 10*9/L Final   ??? Absolute Eosinophils 10/31/2020 0.0  0.0 - 0.5 10*9/L Final   ??? Absolute Basophils 10/31/2020 0.0  0.0 - 0.1 10*9/L Final   ??? Microcytosis 10/31/2020 Slight (A) Not Present Final   ??? Anisocytosis 10/31/2020 Marked (A) Not Present Final   ??? Hypochromasia 10/31/2020 Slight (A) Not Present Final   ??? Bilirubin, Direct 10/31/2020 0.10  0.00 - 0.30 mg/dL Final   ??? Smear Review Comments 10/31/2020 See Comment (A) Undefined Final    Slide reviewed.      ??? Burr Cells 10/31/2020 Present (A) Not Present Final   ??? Vitamin B-12 10/31/2020 277  211 - 911 pg/ml Final       Current medications:     Current Outpatient Medications   Medication Sig Dispense Refill   ??? abemaciclib (VERZENIO) 100 mg Tab tablet Take 1 tablet (100 mg total) by mouth Two (2) times a day. Take with or without food. 56 tablet 5   ??? acetaminophen (TYLENOL) 325 MG tablet Take 650 mg by mouth every six (6) hours as needed for pain.     ??? calcium carbonate (TUMS) 200 mg calcium (500 mg) chewable tablet Chew 1 tablet daily.     ??? docusate sodium (COLACE) 100 MG capsule Take 100 mg by mouth nightly as needed (PRN).     ??? ergocalciferol-1,250 mcg, 50,000 unit, (DRISDOL) 1,250 mcg (50,000 unit) capsule Take 1,250 mcg by mouth once a week.     ??? escitalopram oxalate (LEXAPRO) 10 MG tablet Take 1 tablet (10 mg total) by mouth in the morning. 30 tablet 5   ??? letrozole (FEMARA) 2.5 mg tablet Take 1 tablet (2.5 mg total) by mouth daily. 90 tablet 3   ??? mometasone (ELOCON) 0.1 % cream Apply to breast twice daily 90 g 1   ??? omeprazole (PRILOSEC) 20 MG capsule Take 1 capsule (20 mg total) by mouth daily. 90 capsule 3   ??? ondansetron (ZOFRAN-ODT) 4 MG disintegrating tablet Take 1 tablet (4 mg total) by mouth every eight (8) hours as needed for nausea. 30 tablet 3   ??? rimegepant (NURTEC ODT) 75 mg TbDL Take 75 mg by mouth.      ??? SUMAtriptan (IMITREX) 100 MG tablet TAKE ONE TABLET BY MOUTH AT ONSET OF MIGRAINE FOR UP TO 1 DOSE . IF SYMPTOMS PERSIST A SECOND DOSE MAY BE TAKEN IN 2 HOURS IF NEEDED     ??? topiramate (TOPAMAX) 25 MG tablet TAKE 3 TABLETS BY MOUTH ONCE DAILY     ??? topiramate (TOPAMAX) 50 MG tablet Take 50 mg by mouth Two (2) times a day.     ??? vitamin E-134 mg, 200 UNIT, 134 mg (200 UNIT) capsule Take 134 mg by mouth daily.       No current facility-administered medications for this visit.     Facility-Administered Medications Ordered in Other Visits   Medication Dose Route Frequency Provider Last Rate Last Admin   ??? leuprolide (LUPRON) 3.75 mg injection

## 2020-12-04 NOTE — Unmapped (Signed)
[facility] Refill Coordination Note    Specialty Medication(s) to be Shipped:   Hematology/Oncology: Verzenio 100mg     Other medication(s) to be shipped: No additional medications requested for fill at this time     Tami Gibson, DOB: 1990-06-22  Phone: [phone] (home)       All above HIPAA information was verified with patient.     Was a Nurse, learning disability used for this call? No    Completed refill call assessment today to schedule patient's medication shipment from the [facility] [phone]).  All relevant notes have been reviewed.     Specialty medication(s) and dose(s) confirmed: Patient reports changes to the regimen as follows: Take 1 tablet (100 mg total) by mouth Two (2) times a day. Take with or without food. **new rx is on profile**   Changes to medications: Tami Gibson reports no changes at this time.  Changes to insurance: No  New side effects reported not previously addressed with a pharmacist or physician: None reported  Questions for the pharmacist: No    Confirmed patient received a Conservation officer, historic buildings and a Surveyor, mining with first shipment. The patient will receive a drug information handout for each medication shipped and additional FDA Medication Guides as required.       DISEASE/MEDICATION-SPECIFIC INFORMATION        N/A    SPECIALTY MEDICATION ADHERENCE     Medication Adherence    Patient reported X missed doses in the last month: 0  Specialty Medication: Verzenio 100mg   Patient is on additional specialty medications: No        Were doses missed due to medication being on hold? No    Verzenio 100 mg: 0 days of medicine on hand     REFERRAL TO PHARMACIST     Referral to the pharmacist: Not needed      [facility]     Shipping address confirmed in Epic.     Delivery Scheduled: Yes, Expected medication delivery date: 12/05/2020.     Medication will be delivered via Same Day Courier to the prescription address in Epic WAM.    Tami Gibson   [facility] Specialty Technician

## 2020-12-05 NOTE — Unmapped (Signed)
TC to Tami Gibson re: she had episode of dizziness, tunnel vision X 2, hot and cold flashes at work this morning while here at work, sat down and had one of the episodes of tunnel vision while sitting; also has cough and congestion since this past weekend when she [place] in her sinuses. She also has continued diarrhea from [employer] she should be evaluated at either Urgent Care or her PCP. She states she has a plan to continue to work today, isolated from other team members, and will either call her PCP for first available appt or will go to local Urgent Care offices.   Jamse Belfast, PA, Tomi Likens, MD, and Raelene Bott, CPP copied on this note.

## 2020-12-05 NOTE — Unmapped (Signed)
[facility] Triage Note     Patient: Tami Gibson     Reason for call:  return call    Time call returned: 0849     Phone Assessment: Pt states she has had had some dizziness with hot/cold sweats and tunnel vision. She states she was in a co workers office when she first started feeling dizzy and sat on floor but it has continued.   She has also been experiencing some congestion and [place]  No fever, no n/v, some diarrhea but relates that to the verzenio.       Triage Recommendations: RN to consult with team and infusion NP.

## 2020-12-05 NOTE — Unmapped (Signed)
Hi,     Tami Gibson has contacted the Communication Center in regards to the following symptom:     Dizziness, Lightheadedness, tunnel vision, cough    Please contact [name] at [phone]    A page or telephone call has been made to the corresponding clinic.     Thank you,  Kelli Hope   [facility]   [phone]

## 2020-12-07 NOTE — Unmapped (Signed)
TC to Ms Symons following up her tunnel vision/cough/congestion. Tunnel vision has resolved, cough/congestion continue--she is using guaifenesin with codeine and mucinex; cough is worse at night. She feels these symptoms directly related [place] and [place] water washed up into her sinuses.     She has an appt with her neurologist on Monday.     I will let her know what the process is for completing the self screen for her appt with Dr Laney Pastor next week.

## 2020-12-12 ENCOUNTER — Ambulatory Visit: Admit: 2020-12-12 | Discharge: 2020-12-12 | Payer: PRIVATE HEALTH INSURANCE

## 2020-12-12 ENCOUNTER — Other Ambulatory Visit: Admit: 2020-12-12 | Discharge: 2020-12-12 | Payer: PRIVATE HEALTH INSURANCE

## 2020-12-12 DIAGNOSIS — C50911 Malignant neoplasm of unspecified site of right female breast: Principal | ICD-10-CM

## 2020-12-12 DIAGNOSIS — Z170 Estrogen receptor positive status [ER+]: Principal | ICD-10-CM

## 2020-12-12 LAB — CBC W/ AUTO DIFF
BASOPHILS ABSOLUTE COUNT: 0 10*9/L (ref 0.0–0.1)
BASOPHILS RELATIVE PERCENT: 0.7 %
EOSINOPHILS ABSOLUTE COUNT: 0 10*9/L (ref 0.0–0.5)
EOSINOPHILS RELATIVE PERCENT: 0.9 %
HEMATOCRIT: 31.3 % — ABNORMAL LOW (ref 34.0–44.0)
HEMOGLOBIN: 10.7 g/dL — ABNORMAL LOW (ref 11.3–14.9)
LYMPHOCYTES ABSOLUTE COUNT: 0.9 10*9/L — ABNORMAL LOW (ref 1.1–3.6)
LYMPHOCYTES RELATIVE PERCENT: 21.2 %
MEAN CORPUSCULAR HEMOGLOBIN CONC: 34.3 g/dL (ref 32.0–36.0)
MEAN CORPUSCULAR HEMOGLOBIN: 27.9 pg (ref 25.9–32.4)
MEAN CORPUSCULAR VOLUME: 81.2 fL (ref 77.6–95.7)
MEAN PLATELET VOLUME: 7 fL (ref 6.8–10.7)
MONOCYTES ABSOLUTE COUNT: 0.2 10*9/L — ABNORMAL LOW (ref 0.3–0.8)
MONOCYTES RELATIVE PERCENT: 5.5 %
NEUTROPHILS ABSOLUTE COUNT: 3.1 10*9/L (ref 1.8–7.8)
NEUTROPHILS RELATIVE PERCENT: 71.7 %
PLATELET COUNT: 182 10*9/L (ref 150–450)
RED BLOOD CELL COUNT: 3.86 10*12/L — ABNORMAL LOW (ref 3.95–5.13)
RED CELL DISTRIBUTION WIDTH: 16.7 % — ABNORMAL HIGH (ref 12.2–15.2)
WBC ADJUSTED: 4.4 10*9/L (ref 3.6–11.2)

## 2020-12-12 LAB — COMPREHENSIVE METABOLIC PANEL
ALBUMIN: 4 g/dL (ref 3.4–5.0)
ALKALINE PHOSPHATASE: 110 U/L (ref 46–116)
ALT (SGPT): 11 U/L (ref 10–49)
ANION GAP: 7 mmol/L (ref 5–14)
AST (SGOT): 16 U/L (ref <=34)
BILIRUBIN TOTAL: 0.5 mg/dL (ref 0.3–1.2)
BLOOD UREA NITROGEN: 17 mg/dL (ref 9–23)
BUN / CREAT RATIO: 16
CALCIUM: 9.7 mg/dL (ref 8.7–10.4)
CHLORIDE: 105 mmol/L (ref 98–107)
CO2: 24 mmol/L (ref 20.0–31.0)
CREATININE: 1.04 mg/dL — ABNORMAL HIGH
EGFR CKD-EPI (2021) FEMALE: 73 mL/min/{1.73_m2} (ref >=60)
GLUCOSE RANDOM: 87 mg/dL (ref 70–179)
POTASSIUM: 3.5 mmol/L (ref 3.4–4.8)
PROTEIN TOTAL: 7.8 g/dL (ref 5.7–8.2)
SODIUM: 136 mmol/L (ref 135–145)

## 2020-12-13 ENCOUNTER — Ambulatory Visit
Admit: 2020-12-13 | Discharge: 2021-01-08 | Payer: PRIVATE HEALTH INSURANCE | Attending: Rehabilitative and Restorative Service Providers" | Primary: Rehabilitative and Restorative Service Providers"

## 2020-12-13 NOTE — Unmapped (Signed)
[facility] THERAPY SERVICES [place]  OUTPATIENT PHYSICAL THERAPY  12/13/2020  Note Type: Treatment Note       Patient Name: Tami Gibson  Date of Birth:Oct 06, 1990  Diagnosis:   Encounter Diagnoses   Name Primary?   ??? Lymphedema syndrome, postmastectomy Yes   ??? Scar condition and fibrosis of skin    ??? Malignant neoplasm of right breast in female, estrogen receptor positive, unspecified site of breast (CMS-HCC)      Referring MD:  Jobe Marker*     Date of Onset of Impairment-10/20/2020  Date PT Care Plan Established or Reviewed-10/30/2020  Date PT Treatment Started-10/30/2020   Plan of Care Effective Date: 10-30-20 to 01-28-21      Session #:  4      Contraindications:  none  Precautions:  none  Red Flags:  history of cancer, right breast IB    Metastatic cancer:   no      ASSESSMENT/Progress toward goals:  Pt is progressing toward meeting goals and Patient's skin integrity is improving.  Skin is softening with treatment which allows for improved lymph uptake, and decreased risk of infection Has gotten Jobst Bella Strong 20-30 sleeve and glove, RTW.  Using night garment and pump. Very mild cording.  Pt is doing an excellent job at self care.  Right arm is smaller than left today.      Recommendations for treatment/garments:  1) Patient will obtain proper garments to address swelling and improve tissue integrity. Garment recommendations : Mediven Harmony CCL 2 sleeve and glove, Jovi pak night time compression  2) Pt will be seen clinically for rehabilitation to include treatments to address as needed: edema/lymphedema, pain, ROM, strength, balance and endurance deficits, as well as learning self care strategies to address these deficits  3) Pt will require a pneumatic compression pump because despite conservative treatment ZO:[id number] garments or devices, exercise, elevation, skin care and self MLD,the patient exhibits the following symptoms:hyperplasia, hyperpigmentation, fibrosis, truncal/abdominal swelling, chest/axillary swelling and unable to control swelling    Patient requires skilled Physical Therapy services  for the following problem list and secondary functional limitations:    Problem List:   Uncontrolled swelling and/or lack of appropriate compression garment    Secondary Functional Limitations:  Uncontrolled swelling can increase chances of  infection, and limit functional ROM     Patient Goals: Decrease swelling and use as many devices as needed for self care so that I get this under control    Physical Therapy Goals:   In 1-3 months: Pt and/or caregiver will:  Be able to demonstrate self MLD, donning/doffing garments to manage swelling  MET 12-13-20  Decrease limb volume by 1-3 % for proper fit of clothing and to minimize infection risk MEt 12-13-20    In 6-12 months: Pt and/or caregiver will:  Retain an optimal limb volume that allows pt to fit into appropriate compression garments and clothing to improve functional use of UE and QOL    Prognosis for goal achievement:   Good due to previous success.      PLAN:   Within a 90 day certification period, schedule permitting, decreasing frequency as able, patient will participate in the following as needed: Manual lymphatic drainage, compression bandaging or use of reduction kits, appropriate garment recommendations, skin care and therapeutic exercise, manual therapy, low level laser therapy, lymphatouch, taping, ROM and strength training, education on posture and body mechanics,pump/equipment recommendations, balance exercises,endurance exercises, and strategies to assist with sensation deficits and pain reduction.  Planned frequency and duration  of treatment:   1 x week x 10 weeks    Next Visit Plan:  Take circ, MLD, MFR, lymphatouch, taping to hand prn    Pt to continue with current plan of care to educate in self care, precautions, and management of symptoms, optimal edema reduction/maintenance of girth, soft tissue changes, fitting of appropriate compression garments, and progression of exercises to optimize functional ROM, strength, balance and endurance in all ADL's(home, work, recreational, community)    SUBJECTIVE:  Patient???s communication preference: verbal, written, visual, prn   History of Present Illness/ Pt reports:  [place] kept arm in water when floating around, otherwise with sleeve, using pump Going [place], [place] Glove occasionally feels tight, instructed to remove prn   Location of pain: No pain        OBJECTIVE:      Posture/Observations:  Forward head, rounded shoulders     Range of Motion/Flexibilty:   UE WFL   Axillary Web Syndrome location: no     Strength/MMT:   UE WFL        Girth Measurements: taken in cm    Date: 5-31 5-31 6-23 6-23 7-14 7-14        Right Left RIght Left Right Left RIght Left RIght Left   MCP 16.8 16.5 19 18  16.5 16       Mid palm 18 18 19  18.5 18.5 17       Thumb DIP 6            1st finger DIP 4.9            1st finger PIP 6            2nd finger DIP 4.9            2nd finger PIP 5.9            3rd finger DIP 4.5            3rd finger PIP 5.3            4th finger DIP 4.0            4th finger PIP 4.5                         Axilla             Mid breast nipple line             Ribs below breast             Natural Waistline             Umbilicus             Hips at  cm below umbilicus             Limb Volume                        Location of swelling:  right hand , wrist, forearm, elbow, arm, shoulder, axilla and chest    Skin condition:  Stemmer???s sign:no.  increased skin thickness and fibrosis, softening with treatment  Incision/Scar:   incision healing well postoperatively    Sensation :  Peripheral neuropathy:no  Light touch:     Decreased sensation under axilla, around scar, and posterior lateral upper arm         Total Treatment Time:  53 min  Treatment Rendered:    Patient and/or caregiver and therapist were mask compliant per current [facility] COVID mask policy during the entire treatment session.      Manual x 53 min   Axillary Web syndrome release , Scar massage, Lymphatouch to affected areas with simultaneous MFR and MLD to appropriate anastomoses in order to decrease swelling.  Application of compression garment/devices prn to prevent re-accumulation of fluid. hand taping          Equipment provided/recommended:   N/A   Communication/consultation with other professionals:  N/A    Referrals made to the following providers:  none    Medical Necessity: This treatment is medically necessary throughout the course of this patient's life during and after cancer treatments to improve functional activities and /or to minimize the decline of functional abilities and worsening of symptoms,including infection and recurrent hospitalizations, through independent/home management strategies.  Lymphedema, a chronic, progressive condition for which there is no cure, is marked by the accumulation of protein-rich fluid in one or more quadrants of the body due to primary or secondary disruption of the lymphatic system.  The sustained accumulation results in tissue inflammation, an increase in fatty tissue, and development of obstructive connective tissue.  These changes may result in an increased risk of infection, disfigurement and a decrease in mobility and functional performance.   Pt will benefit from physical therapy by a certified lymphedema therapist to address : education in self care, precautions, and management of symptoms, optimal edema reduction/maintenance of girth, optimal soft tissue changes, fitting of appropriate compression garments, and progression of exercises to optimize functional ROM and strength, balance, and endurance in all ADL's(home, work, recreational, community).      I attest that I have reviewed the above information.  Signed: Clementeen Graham, PT   12/13/2020 1:10 PM

## 2020-12-13 NOTE — Unmapped (Signed)
It was a pleasure to see you today in the [facility].      For clinical concerns during working hours, please call [phone].     For clinical trial questions please call the study coordinator.     For emergencies, evenings or weekends, please call [phone] and ask for the oncology fellow on call.  Reasons to call the emergency line may include:  - Fever of 100.5 or greater  - Nausea and/or vomiting not relieved with nausea medicine  - Diarrhea or constipation not relieved with bowel regimen  - Severe pain not relieved with usual pain regimen

## 2020-12-13 NOTE — Unmapped (Signed)
Breast Oncology Return Patient Evaluation  Referring Physician: Durenda Hurt, Md  [address]  [address]  [place],  [place] [address].  PCP: Durenda Hurt, MD  Breast Med/Onc: Adonis Brook, MD    Cancer Team  Surgical Oncology: Jobe Marker, MD  Radiation Oncology: Rayetta Humphrey, MD  Medical Oncology: Adonis Brook, MD  Plastic Surgery: Levan Hurst, MD  Genetics: Audree Camel, MD    Reason for Visit: A 30 y.o. female with breast cancer referred for consultation for recommendations concerning the management of breast cancer.    Assessment/Plan:   Cancer Staging  Malignant neoplasm of right breast in female, estrogen receptor positive (CMS-HCC)  Staging form: Breast, AJCC 8th Edition  - Clinical: Stage IB (cT1c, cN1, cM0, G1, ER+, PR+, HER2-) - Signed by [name], MD on 12/02/2019  - Pathologic stage from 04/20/2020: No Stage Recommended (ypT1c, pN1a, cM0, G2, ER+, PR+, HER2-) - Signed by [name], MD on 04/25/2020  ??  #Stage IB (cT1c, cN1, cM0, G1, ER+, PR+, HER2-)right breast cancer    Neoadjuvant chemotherapy  Completed ddAC followed by DDTaxol on 03/21/20    Locoregional therapy  -s/p Right mastectomy with SLN and prophylactic left mastectomy  -Pathology consistent with residual disease, MD Dareen Piano residual cancer burden class: RCB-III  With 2/3 positive LNs   - ALND completed on 05/07/2020-1/16 lymph nodes involved. Total 3 LNs involved by metastatic adenocarcinoma  -Completed adjuvant RT on 08/06/2020    Adjuvant therapy  Ovarian suppression (lupron) + Letrozole - SOT 08/09/2020  Adjuvant Abemaciclib - SOT 08/29/2020. Dose reduced to 100mg  BID in June 2022 to improve tolerance      Staging scans  -Patient had baseline staging scans which are negative for any metastatic disease  -Patient requesting to repeat her staging scans. She is having a really hard time moving on after her diagnosis and feels like repeating scans will help reassure her that theres is no cancer left in her body.   -Repeat scans on March 2022 showed no evidence of metastasis    #Hot flashes  -Patient endorses hot flashes since initiation of aromatase inhibition therapy  -Discussed pharmacologic options including oxybutynin, gabapentin, or cross-tapering escitalopram (managed by PCP for anxiety) for venlafaxine, but patient opts for no pharmacologic therapy at this time    #Syncope/Presyncope  -Patient has had multiple episodes of presyncope with tunnel vision and one episode of syncope with preceding tunnel vision and lightheadedness all occurring approximately 4 to 5 days after chemo infusions.  No positional component and she is trying to stay well-hydrated.    -Extensive work-up has been unrevealing, including labs, chest CTA, brain MRI, CT head.  TTE prior to start of chemotherapy showed structurally normal heart.  She is on both Zofran and Lexapro, both which can prolong QT, but recent EKGs reassuring with normal QT interval, no preexcitation.  2 week Zio-patch w/o evidence of any concerning arrhythmia  -Overall, presentation seems most consistent with vasovagal etiology, unlikely to be cardiogenic. No symptoms since she was done with [facility].  - Cardiology following - followup limited TTE unremarkable  -Resolved    # Bone health  -Baseline bone mineral density evaluation prior to starting an aromatase inhibitor was normal  -Repeat in March 2024  ??  ??# Supportive care  - Psychosocial: Supportive mother and husband. Follows with [facility] and [facility]  -Heartburn: Daily PPI prescribed  -Hotflashes: as above  -Lymphedema: Continues to wear her compression sleeve and follows with physical therapy.  -  Diarrhea: Imodium as needed    DISPO:  --Fu in 3 months, labs prior  --Continue lupron monthly with nurse visits    -----------------------------------------------------------------------------------------------------------------------------------------------    HPI: Tami Gibson is a 30 y.o. female who is seen for routine follow-up of management of breast cancer.     Interval History:  Tami Gibson was experiencing diarrhea approximately two times daily and controlling it with Imodium.  She also had nausea that was being controlled with ondansetron ODT.  She was then diagnosed with Covid, started Paxlovid on 10/05/20, and held abemaciclib.  After restarting abemaciclib she started to have severe abdominal pain and very watery stools, especially after eating.  She held abemaciclib for 2 weeks to see if her symptoms improved.  Her symptoms resolved and she restarted abemaciclib 150 mg BID.    ??  After restarting abemaciclib, she reports that she again has diarrhea, approximately 3-4 stools daily (without using Imodium).  She also has abdominal cramping 1-2 times daily.  Her GI adverse effects have improved with the 2 week holiday but they are still worse then before having Covid.  Abema was dose reduced  to 100 mg BID to improve tolerance. Since then, she reports she has been feeling much better and tolerating it quite well with improved diarrhea and nausea.          --Performance status=0    Breast Cancer History  Oncology History Overview Note   30 year old female with recently self palpated right breast lump. A targeted right breast ultrasound on 11/15/2019 showed an irregular mass with spiculated margins at the 7:00 position, 6 cm from the nipple, measuring 1.4 x 1.2 x 1.0 cm. There were 2 abnormal level 1 axillary lymph nodes measuring 1.4 x 1.3 cm and 1.2 x 0.9 cm. Additionally, there was a high level 1/level 2 axillary lymph node, measuring 2.1 x 1.3 cm with an effaced fatty hilum.      Malignant neoplasm of right breast in female, estrogen receptor positive (CMS-HCC)   11/09/2019 Initial Diagnosis    Malignant neoplasm of right breast in female, estrogen receptor positive (CMS-HCC)     11/15/2019 Biopsy    Invasive ductal carcinoma  - [place] combined histologic grade: 1  ER 91-100%, PR 91-100%, HER2 negative by IHC    Lymph node, right axilla, core biopsy  - Lymph node positive for metastatic carcinoma,      12/02/2019 -  Cancer Staged    Staging form: Breast, AJCC 8th Edition  - Clinical: Stage IB (cT1c, cN1, cM0, G1, ER+, PR+, HER2-) - Signed by [name], MD on 12/02/2019       12/09/2019 -  Chemotherapy    OP BREAST AC (Dose Dense) q2W X 4 CYCLES, THEN PACLITAXEL (DOSE DENSE) Q2W x 4 CYCLES  DOXOrubicin 60 mg/m2 IV on day 1, cyclophosphamide 600 mg/m2 IV on day 1, every 2 weeks for 4 cycles, then PACLitaxel 175 mg/m2 every 2 weeks for 4 cycles      04/20/2020 Surgery    Breast, right, simple mastectomy  -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 Extracapsular extension is identified      Breast, left, simple mastectomy  -Benign breast tissue with no atypia, in situ or invasive carcinoma identified     04/20/2020 -  Cancer Staged    Staging form: Breast, AJCC 8th Edition  -  Pathologic stage from 04/20/2020: No Stage Recommended (ypT1c, pN1a, cM0, G2, ER+, PR+, HER2-) - Signed by [name], MD on 04/25/2020       05/07/2020 Surgery    Lymph nodes, right axillary, dissection  -One of sixteen lymph nodes involved by metastatic adenocarcinoma (1/16)  -Metastatic focus measures 3 mm in greatest dimension  -No extracapsular extension is identified     06/28/2020 - 08/06/2020 Radiation    The total radiation dose will be 5000 cGy at 200 cGy/fraction for a total of 25 fractions, treated once a day     08/13/2020 Endocrine/Hormone Therapy    Letrozole 2.5mg  +Ovarian suppression (monthly lupron)     Malignant neoplasm of right breast in female, estrogen receptor positive (CMS-HCC)   05/07/2020 Surgery    Lymph nodes, right axillary, dissection  -One of sixteen lymph nodes involved by metastatic adenocarcinoma (1/16)  -Metastatic focus measures 3 mm in greatest dimension  -No extracapsular extension is identified     06/13/2020 -  Radiation    Radiation Therapy Treatment Details (Noted on 06/13/2020)  Site: Right Breast - Overlapping sites  Technique: 3D CRT  Goal: No goal specified  Planned Treatment Start Date: No planned start date specified     06/21/2020 -  Radiation    Radiation Therapy Treatment Details (Noted on 06/21/2020)  Site: Right Breast - Overlapping sites  Technique: 3D CRT  Goal: No goal specified  Planned Treatment Start Date: No planned start date specified         Past Medical History  Past Medical History:   Diagnosis Date   ??? Acne     started during teen yrs., Accutane 2008-2009 during college yrs   ??? Anemia     During teen years only, no anemia recently   ??? 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 Gestational hypertension 01/30/2017    end of pregnancy had hypertension, was induced   ??? Headache     migraine occasionally   ??? Hypertension     during week 37 week, pt induced and had C/S due to hypertension   ??? 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 Kidney stone    ??? Malignant neoplasm of right breast in female, 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/30/2019   ??? Pyelonephritis 09/2016    Kidney stones   ??? Urinary tract infection     Had 5-6 in Lifetime, esp high school and college age   ??? Varicella     during childhood   ??? Vitamin B 12 deficiency        Reproductive/GYN History  OB History   Gravida Para Term Preterm AB Living   2 2 2  0 0 2   SAB IAB Ectopic Molar Multiple Live Births   0 0 0 0 0 2      # Outcome Date GA Lbr Len/2nd Weight Sex Delivery Anes PTL Lv   2 Term 02/17/19 [redacted]w[redacted]d  3545 g (7 lb 13 oz) F CS-LTranv Spinal, EPI N LIV      Complications: Failure to Progress in First Stage      Name: Murphey,CARTER RAE      Apgar1: 8  Apgar5: 9   1 Term 01/30/17 [redacted]w[redacted]d  2875 g (6 lb 5.4 oz) F CS-LTranv Spinal, EPI N LIV      Complications: Hypertension, Breech birth      Name: Knapik,OAKLEY Apgar1: 8  Apgar5: 9  Obstetric Comments   OB-History reviewed by Lucilla Lame RN on 02/22/2019.   Gardasil series completed   Last pap:  2017; NIL   No abn paps   No hx STI's       Surgical History  Past Surgical History:   Procedure Laterality Date   ??? BREAST BIOPSY Right     IDC   ??? CESAREAN SECTION  01/30/2017    breech   ??? CHEMOTHERAPY     ??? IR INSERT PORT AGE GREATER THAN 5 YRS  12/08/2019    IR INSERT PORT AGE GREATER THAN 5 YRS 12/08/2019 Jobe Gibbon, MD IMG VIR [facility]   ??? LUMBAR PUNCTURE DIAGNOSTIC [facility] HISTORICAL RESULT)  01/2015    to relieve ICP   ??? PR BX/REMV,LYMPH NODE,DEEP AXILL Right 04/20/2020    Procedure: BX/EXC LYMPH NODE; OPEN, DEEP AXILRY NODE;  Surgeon: Moss Mc, MD;  Location: MAIN OR [facility];  Service: Surgical Oncology Breast   ??? PR CESAREAN DELIVERY ONLY N/A 01/30/2017    Procedure: CESAREAN DELIVERY ONLY;  Surgeon: Asher Muir, MD;  Location: L&D C-SECTION OR SUITES [facility];  Service: Maternal-Fetal Medicine   ??? PR CESAREAN DELIVERY ONLY N/A 02/17/2019    Procedure: CESAREAN DELIVERY ONLY;  Surgeon: Lonny Prude, MD;  Location: L&D C-SECTION OR SUITES [facility];  Service: Maternal-Fetal Medicine   ??? PR IMPLNT BIO IMPLNT FOR SOFT TISSUE REINFORCEMENT Bilateral 04/20/2020    Procedure: IMPLANTATION BIOLOGIC IMPLANT(EG, ACELLULAR DERMAL MATRIX) FOR SOFT TISSUE REINFORCEMENT(EG, BREAST, TRUNK);  Surgeon: Arsenio Katz, MD;  Location: MAIN OR [facility];  Service: Plastics   ??? PR INTRAOPERATIVE SENTINEL LYMPH NODE ID W DYE INJECTION Right 04/20/2020    Procedure: INTRAOPERATIVE IDENTIFICATION SENTINEL LYMPH NODE(S) INCLUDE INJECTION NON-RADIOACTIVE DYE, WHEN PERFORMED;  Surgeon: Moss Mc, MD;  Location: MAIN OR [place];  Service: Surgical Oncology Breast   ??? PR INTRAOPERATIVE SENTINEL LYMPH NODE ID W DYE INJECTION Right 05/07/2020    Procedure: INTRAOPERATIVE IDENTIFICATION SENTINEL LYMPH NODE(S) INCLUDE INJECTION NON-RADIOACTIVE DYE, WHEN PERFORMED;  Surgeon: Moss Mc, MD;  Location: MAIN OR [place];  Service: Surgical Oncology Breast   ??? PR MASTECTOMY, SIMPLE, COMPLETE Bilateral 04/20/2020    Procedure: MASTECTOMY, SIMPLE, COMPLETE;  Surgeon: Moss Mc, MD;  Location: MAIN OR [place];  Service: Surgical Oncology Breast   ??? PR REMOVE ARMPITS LYMPH NODES COMPLT Right 05/07/2020    Procedure: AXILLARY LYMPHADENECTOMY; COMPLETE;  Surgeon: Moss Mc, MD;  Location: MAIN OR [place];  Service: Surgical Oncology Breast   ??? PR TISSUE EXPANDER PLACEMENT BREAST RECONSTRUCTION Bilateral 04/20/2020    Procedure: TISSUE EXPANDER PLACEMENT IN BREAST RECONSTRUCTION, INCLUDING SUBSEQUENT EXPANSION(S);  Surgeon: Arsenio Katz, MD;  Location: MAIN OR [facility];  Service: Plastics   ??? TONSILECTOMY, ADENOIDECTOMY, BILATERAL MYRINGOTOMY AND TUBES     ??? WISDOM TOOTH EXTRACTION  2004       Medications    Current Outpatient Medications:   ???  abemaciclib (VERZENIO) 100 mg Tab tablet, Take 1 tablet (100 mg total) by mouth Two (2) times a day. Take with or without food., Disp: 56 tablet, Rfl: 5  ???  acetaminophen (TYLENOL) 325 MG tablet, Take 650 mg by mouth every six (6) hours as needed for pain., Disp: , Rfl:   ???  calcium carbonate (TUMS) 200 mg calcium (500 mg) chewable tablet, Chew 1 tablet daily., Disp: , Rfl:   ???  docusate sodium (COLACE) 100 MG capsule, Take 100 mg by mouth nightly as needed (PRN)., Disp: ,  Rfl:   ???  ergocalciferol-1,250 mcg, 50,000 unit, (DRISDOL) 1,250 mcg (50,000 unit) capsule, Take 1,250 mcg by mouth once a week., Disp: , Rfl:   ???  escitalopram oxalate (LEXAPRO) 10 MG tablet, Take 1 tablet (10 mg total) by mouth in the morning., Disp: 30 tablet, Rfl: 5  ???  fexofenadine (ALLEGRA) 180 MG tablet, Take 180 mg by mouth., Disp: , Rfl:   ???  letrozole (FEMARA) 2.5 mg tablet, Take 1 tablet (2.5 mg total) by mouth daily., Disp: 90 tablet, Rfl: 3  ???  omeprazole (PRILOSEC) 20 MG capsule, Take 1 capsule (20 mg total) by mouth daily., Disp: 90 capsule, Rfl: 3  ???  ondansetron (ZOFRAN-ODT) 4 MG disintegrating tablet, Take 1 tablet (4 mg total) by mouth every eight (8) hours as needed for nausea., Disp: 30 tablet, Rfl: 3  ???  rimegepant (NURTEC ODT) 75 mg TbDL, Take 75 mg by mouth. , Disp: , Rfl:   ???  SUMAtriptan (IMITREX) 100 MG tablet, TAKE ONE TABLET BY MOUTH AT ONSET OF MIGRAINE FOR UP TO 1 DOSE . IF SYMPTOMS PERSIST A SECOND DOSE MAY BE TAKEN IN 2 HOURS IF NEEDED, Disp: , Rfl:   ???  topiramate (TOPAMAX) 25 MG tablet, TAKE 3 TABLETS BY MOUTH ONCE DAILY, Disp: , Rfl:   ???  topiramate (TOPAMAX) 50 MG tablet, Take 50 mg by mouth Two (2) times a day., Disp: , Rfl:   ???  vitamin E-134 mg, 200 UNIT, 134 mg (200 UNIT) capsule, Take 134 mg by mouth daily., Disp: , Rfl:   No current facility-administered medications for this visit.    Facility-Administered Medications Ordered in Other Visits:   ???  leuprolide (LUPRON) 3.75 mg injection, , , ,     Allergies  No Known Allergies    Personal and Social History  Social History     Social History Narrative    PRECONCEPTUAL ASSESSMENT:        Dennice is a 30 y.o. Caucasian female    Caffeine:denies use    Cats: no    Exercise: not active    Diet: balanced    Family hx of of birth defects, chromosomal abnormality, intellectual disability, developmental delay: no.            PARTNER HISTORY:     Domingo Dimes is a 77 y.o.  Caucasian female.    Occupation:  Immunologist; [employer]    He has fathered children:  no    He is healthy with no chronic illness    Family history of  birth defects, chromosomal abnormality, intellectual disability, or developmental delay: no.                Family History  Family History   Problem Relation Age of Onset   ??? Cancer Maternal Aunt 45        breast Ca   ??? Hypertension Father    ??? Other Father 26        Benign brain tumor   ??? Hypothyroidism Mother    ??? Cancer Paternal Aunt         Breast Ca dx in 75s   ??? ALS Maternal Aunt    ??? Ovarian cancer Maternal Grandmother 3   ??? No Known Problems Maternal Grandfather    ??? Cancer Paternal Grandmother         Unknown Cancer   ??? No Known Problems Paternal Grandfather    ??? No Known Problems Maternal Aunt    ??? No Known  Problems Maternal Aunt    ??? Breast cancer Maternal Cousin         Dx in 46s   ??? No Known Problems Paternal Aunt    ??? No Known Problems Daughter    ??? No Known Problems Daughter    ??? No Known Problems Other    ??? BRCA 1/2 Neg Hx    ??? Colon cancer Neg Hx    ??? Endometrial cancer Neg Hx          Review of Systems: A 12-system review of systems was obtained including: Constitutional, Eyes, ENT, Cardiovascular, Respiratory, GI, GU, Musculoskeletal, Skin, Neurological, Psychiatric, Endocrine, Heme/Lymphatic, and Allergic/Immunologic systems. It is negative or non-contributory to the patient???s management except for the following: See Interval History. For details see the Symptom Report Form (MIM#1170), which I have reviewed, signed and will be scanned into the record.    Physical Examination:  Vital Signs: BP 118/71  - Pulse 83  - Temp 36.3 ??C (97.4 ??F) (Temporal)  - Resp 17  - Ht 167.6 cm (5' 5.98)  - Wt 93.5 kg (206 lb 3.2 oz)  - SpO2 100%  - BMI 33.30 kg/m??   General:  Healthy-appearing female in no acute distress..  Cardiovasc:  No heaves, regular, no additional sounds. No lower extremity edema.    Respiratory:  Chest clear to percussion and auscultation, unlabored  Gastrointestinal:  Soft, nontender, no hepatomegaly.   Musculoskeletal:  No bony pain or tenderness.   Skin and Subcutaneous Tissues: No significant rash.  Psychiatric: Mood is normal.  No other symptoms.   Neuro:  Alert and oriented.  Gait and coordination normal  Upper Extremity Lymphedema: None.   Breasts: Bilateral mastectomies with expanders in place. Mild tenderness at surgical incision sites. Performed with assistance of Julieanne Cotton serving as female chaperone.  Heme/Lymphatic/Immunologic: No supraclavicular or cervical adenopathy    I have personally reviewed the following diagnostic studies:    Breast imaging and pathology reviewed. See Results for details

## 2020-12-14 NOTE — Unmapped (Cosign Needed)
CC: Post op     HPI:  Tami Gibson is a 30 y.o. 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 Teola Bradley) and immediate reconstruction with tissue expander placement on 04/20/20 who presents today for post op check. Patients final pathology revealed residual disease with 2/3 positive lymph nodes and she underwent ALND with Dr. Teola Bradley on 05/07/20.     She underwent adjuvant radiation (completed 08/06/20). She has been fully expanded 600/600 as of 09/11/20. She is scheduled for bilateral DIEP flaps in 07/2021.    She reports that she started CDT with PT before she ever noticed any swelling of her RUE. She states she completed therapy and then in May she noticed RUE swelling on a day where she was not wearing her garment. She went back to PT for CDT and has now been wearing a compression garment around the clock (Juzo 20-27mmhg). She has also been using a lymphedema pump. She notes that since restarting CDT with PT her RUE swelling has improved. She has never had any issues with cellulitis of the extremity. She has never had any imaging for lymphedema. She was scheduled to see Dr. Donata Duff on Wednesday but states that she cancelled this appointment because she has something going on this weekend and did not want to come back to clinic twice in the same week. She states she isn't sure she would want a second surgery for lymphedema if she would still have to wear a compression garment all the time.        Past Medical History:  Past Medical History:   Diagnosis Date   ??? Acne     started during teen yrs., Accutane 2008-2009 during college yrs   ??? Anemia     During teen years only, no anemia recently   ??? 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 Gestational hypertension 01/30/2017    end of pregnancy had hypertension, was induced   ??? Headache     migraine occasionally   ??? Hypertension     during week 37 week, pt induced and had C/S due to hypertension   ??? 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 Kidney stone    ??? Malignant neoplasm of right breast in female, 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/30/2019   ??? Pyelonephritis 09/2016    Kidney stones   ??? Urinary tract infection     Had 5-6 in Lifetime, esp high school and college age   ??? Varicella     during childhood   ??? Vitamin B 12 deficiency        Past Surgical History:  Past Surgical History:   Procedure Laterality Date   ??? BREAST BIOPSY Right     IDC   ??? CESAREAN SECTION  01/30/2017    breech   ??? CHEMOTHERAPY     ??? IR INSERT PORT AGE GREATER THAN 5 YRS  12/08/2019    IR INSERT PORT AGE GREATER THAN 5 YRS 12/08/2019 Jobe Gibbon, MD IMG VIR [facility]   ??? LUMBAR PUNCTURE DIAGNOSTIC [facility] HISTORICAL RESULT)  01/2015    to relieve ICP   ??? PR BX/REMV,LYMPH NODE,DEEP AXILL Right 04/20/2020    Procedure: BX/EXC LYMPH NODE; OPEN, DEEP AXILRY NODE;  Surgeon: Moss Mc, MD;  Location: MAIN OR [facility];  Service: Surgical Oncology Breast   ??? PR CESAREAN DELIVERY ONLY  N/A 01/30/2017    Procedure: CESAREAN DELIVERY ONLY;  Surgeon: Asher Muir, MD;  Location: L&D C-SECTION OR SUITES [facility];  Service: Maternal-Fetal Medicine   ??? PR CESAREAN DELIVERY ONLY N/A 02/17/2019    Procedure: CESAREAN DELIVERY ONLY;  Surgeon: Lonny Prude, MD;  Location: L&D C-SECTION OR SUITES [facility];  Service: Maternal-Fetal Medicine   ??? PR IMPLNT BIO IMPLNT FOR SOFT TISSUE REINFORCEMENT Bilateral 04/20/2020    Procedure: IMPLANTATION BIOLOGIC IMPLANT(EG, ACELLULAR DERMAL MATRIX) FOR SOFT TISSUE REINFORCEMENT(EG, BREAST, TRUNK);  Surgeon: Arsenio Katz, MD;  Location: MAIN OR [facility];  Service: Plastics   ??? PR INTRAOPERATIVE SENTINEL LYMPH NODE ID W DYE INJECTION Right 04/20/2020    Procedure: INTRAOPERATIVE IDENTIFICATION SENTINEL LYMPH NODE(S) INCLUDE INJECTION NON-RADIOACTIVE DYE, WHEN PERFORMED;  Surgeon: Moss Mc, MD; Location: MAIN OR [place];  Service: Surgical Oncology Breast   ??? PR INTRAOPERATIVE SENTINEL LYMPH NODE ID W DYE INJECTION Right 05/07/2020    Procedure: INTRAOPERATIVE IDENTIFICATION SENTINEL LYMPH NODE(S) INCLUDE INJECTION NON-RADIOACTIVE DYE, WHEN PERFORMED;  Surgeon: Moss Mc, MD;  Location: MAIN OR [place];  Service: Surgical Oncology Breast   ??? PR MASTECTOMY, SIMPLE, COMPLETE Bilateral 04/20/2020    Procedure: MASTECTOMY, SIMPLE, COMPLETE;  Surgeon: Moss Mc, MD;  Location: MAIN OR [place];  Service: Surgical Oncology Breast   ??? PR REMOVE ARMPITS LYMPH NODES COMPLT Right 05/07/2020    Procedure: AXILLARY LYMPHADENECTOMY; COMPLETE;  Surgeon: Moss Mc, MD;  Location: MAIN OR [place];  Service: Surgical Oncology Breast   ??? PR TISSUE EXPANDER PLACEMENT BREAST RECONSTRUCTION Bilateral 04/20/2020    Procedure: TISSUE EXPANDER PLACEMENT IN BREAST RECONSTRUCTION, INCLUDING SUBSEQUENT EXPANSION(S);  Surgeon: Arsenio Katz, MD;  Location: MAIN OR [facility];  Service: Plastics   ??? TONSILECTOMY, ADENOIDECTOMY, BILATERAL MYRINGOTOMY AND TUBES     ??? WISDOM TOOTH EXTRACTION  2004       Medications:  Current Outpatient Medications   Medication Sig Dispense Refill   ??? abemaciclib (VERZENIO) 100 mg Tab tablet Take 1 tablet (100 mg total) by mouth Two (2) times a day. Take with or without food. 56 tablet 5   ??? acetaminophen (TYLENOL) 325 MG tablet Take 650 mg by mouth every six (6) hours as needed for pain.     ??? calcium carbonate (TUMS) 200 mg calcium (500 mg) chewable tablet Chew 1 tablet daily.     ??? docusate sodium (COLACE) 100 MG capsule Take 100 mg by mouth nightly as needed (PRN).     ??? ergocalciferol-1,250 mcg, 50,000 unit, (DRISDOL) 1,250 mcg (50,000 unit) capsule Take 1,250 mcg by mouth once a week.     ??? escitalopram oxalate (LEXAPRO) 10 MG tablet Take 1 tablet (10 mg total) by mouth in the morning. 30 tablet 5   ??? fexofenadine (ALLEGRA) 180 MG tablet Take 180 mg by mouth.     ??? letrozole (FEMARA) 2.5 mg tablet Take 1 tablet (2.5 mg total) by mouth daily. 90 tablet 3   ??? omeprazole (PRILOSEC) 20 MG capsule Take 1 capsule (20 mg total) by mouth daily. 90 capsule 3   ??? ondansetron (ZOFRAN-ODT) 4 MG disintegrating tablet Take 1 tablet (4 mg total) by mouth every eight (8) hours as needed for nausea. 30 tablet 3   ??? rimegepant (NURTEC ODT) 75 mg TbDL Take 75 mg by mouth.      ??? SUMAtriptan (IMITREX) 100 MG tablet TAKE ONE TABLET BY MOUTH AT ONSET OF MIGRAINE FOR UP TO 1 DOSE . IF SYMPTOMS PERSIST A SECOND DOSE MAY  BE TAKEN IN 2 HOURS IF NEEDED     ??? topiramate (TOPAMAX) 25 MG tablet TAKE 3 TABLETS BY MOUTH ONCE DAILY     ??? topiramate (TOPAMAX) 50 MG tablet Take 50 mg by mouth Two (2) times a day.     ??? vitamin E-134 mg, 200 UNIT, 134 mg (200 UNIT) capsule Take 134 mg by mouth daily.       No current facility-administered medications for this visit.     Facility-Administered Medications Ordered in Other Visits   Medication Dose Route Frequency Provider Last Rate Last Admin   ??? leuprolide (LUPRON) 3.75 mg injection                Allergies:  No Known Allergies    Family History:   Family History   Problem Relation Age of Onset   ??? Cancer Maternal Aunt 45        breast Ca   ??? Hypertension Father    ??? Other Father 41        Benign brain tumor   ??? Hypothyroidism Mother    ??? Cancer Paternal Aunt         Breast Ca dx in 51s   ??? ALS Maternal Aunt    ??? Ovarian cancer Maternal Grandmother 32   ??? No Known Problems Maternal Grandfather    ??? Cancer Paternal Grandmother         Unknown Cancer   ??? No Known Problems Paternal Grandfather    ??? No Known Problems Maternal Aunt    ??? No Known Problems Maternal Aunt    ??? Breast cancer Maternal Cousin         Dx in 83s   ??? No Known Problems Paternal Aunt    ??? No Known Problems Daughter    ??? No Known Problems Daughter    ??? No Known Problems Other    ??? BRCA 1/2 Neg Hx    ??? Colon cancer Neg Hx    ??? Endometrial cancer Neg Hx     The patient reports no family history for bleeding disorders or anesthetic problems.    Social History:  Social History     Socioeconomic History   ??? Marital status: Married     Spouse name: Dylan   ??? Number of children: 0   Occupational History   ??? Occupation: Recreational therapist    Tobacco Use   ??? Smoking status: Never Smoker   ??? Smokeless tobacco: Never Used   Vaping Use   ??? Vaping Use: Never used   Substance and Sexual Activity   ??? Alcohol use: Not Currently   ??? Drug use: No   ??? Sexual activity: Yes     Partners: Male     Birth control/protection: Pill   Other Topics Concern   ??? Exercise Yes   ??? Living Situation No   Social History Narrative    PRECONCEPTUAL ASSESSMENT:        Tami Gibson is a 30 y.o. Caucasian female    Caffeine:denies use    Cats: no    Exercise: not active    Diet: balanced    Family hx of of birth defects, chromosomal abnormality, intellectual disability, developmental delay: no.            PARTNER HISTORY:     Domingo Dimes is a 82 y.o.  Caucasian female.    Occupation:  Immunologist; [employer]    He has fathered children:  no    He is healthy  with no chronic illness    Family history of  birth defects, chromosomal abnormality, intellectual disability, or developmental delay: no.              Social Determinants of Health     Financial Resource Strain: Low Risk    ??? Difficulty of Paying Living Expenses: Not very hard   Food Insecurity: No Food Insecurity   ??? Worried About Running Out of Food in the Last Year: Never true   ??? Ran Out of Food in the Last Year: Never true   Transportation Needs: No Transportation Needs   ??? Lack of Transportation (Medical): No   ??? Lack of Transportation (Non-Medical): No       ROS:   Otherwise, 12 point review of systems was completed and is negative except as per HPI.      Vitals:   Vitals:    12/17/20 0757   BP: 120/78   Pulse: 92   Resp: 18   Temp: 36.4 ??C (97.5 ??F)   SpO2: 98%        Gen: AA in NAD  HEENT: NCAT, EOMI, PERRL, Non icteric sclerae, MMM  CV: RRR  RESP: quiet respirations on room air  EXT: warm and well perfused, compression garment to RUE  BREASTS: bilateral breasts surgically absent.  NAC surgically absent bilaterally.  Horizontally oriented incisions well healed without keloid/hypertrophic scarring.  expander in appropriate position.  Mild blunting of inferior poles of bilateral breasts.    Impression and Plan:  Tami Gibson is here today for post op check after B SSM with right TAD (Dr. Teola Bradley) and immediate reconstruction with tissue expander placement on 04/20/20. She has completed adjuvant radiation and is fully expanded at 600/600. She has bilateral DIEP flap for breast reconstruction scheduled for 07/2021    - Plan for bilateral DIEP flap, already scheduled for 07/2021  - Discussed that should Dr Donata Duff find her a reasonable candidate for LNT that this could potentially be performed at the time of the DIEP flap, and that we could discuss possibly him performing both procedures for her should that be what she desires  - Ordered lymphoscintigram and CVI studies  - RTC January    No follow-ups on file.

## 2020-12-17 ENCOUNTER — Ambulatory Visit: Admit: 2020-12-17 | Discharge: 2020-12-18 | Payer: PRIVATE HEALTH INSURANCE

## 2020-12-17 DIAGNOSIS — Z9889 Other specified postprocedural states: Principal | ICD-10-CM

## 2020-12-17 NOTE — Unmapped (Signed)
APPOINTMENTS / QUESTIONS  For questions regarding appointments or your surgical care, Monday through Friday, 8 am to 4:30 pm    [facility] Surgery  [address],   [place] [address]  T: [phone]  F: [phone]    Ambulatory Surgery Center  1st Floor, Ambulatory Care Center  [address]  [place], [place] [address]  T: [phone]  Website:  [facility]    [facility]  [address] [place], [place] [address]  Ground Floor [facility]  T: [phone]  F: [phone]    [facility]  [address], [place], [place] [address]  T: [phone]  F: [phone]    [facility]  [address], [place], [place] [address]   1st Floor [facility]  T: [phone]  F: [phone]    [facility]  [address] [place], [place] [address]  Third Floor, [address]  T: [phone]   F: [phone]    Administrative Office  [address]  [place], [place] [address]  T:  [phone]  F:  [phone]      Physicians:  Jackquline Bosch MD:   Nurse:  [name] BSN, RN, CPSN [phone] [email or web address]  Nurse:  Mila Palmer BSN, RN, [place]  [phone] [email or web address]  Surgery Scheduler: Lysle Morales  [phone] [email or web address]     Epimenio Sarin MD:   Nurse:  Vista Mink BSN, RN [phone]  [email or web address]   Surgery Scheduler: Lysle Morales  [phone] [email or web address]    Tarry Kos MD:   Nurse:  [name] BSN, RN, CPSN [phone] [email or web address]  Surgery Scheduler:   Lysle Morales  [phone] [email or web address]    Adeyemi Clydie Braun MD:  Nurse: Mila Palmer BSN, RN, [facility]  [phone] [email or web address]  Surgery Scheduler: Kristine Royal [phone] [email or web address]    Darleene Cleaver MD:  Nurse: Venetia Maxon BSN, RN, CPSN  [phone]  [email or web address]  Surgery Scheduler: Kristine Royal [phone] [email or web address]    Nurse Practitioner:  Ezekiel Slocumb, DNP, NP-C:   Nurse: Venetia Maxon BSN, RN, CPSN  [phone]  [email or web address]  Nurse: Britt Boozer, MSN. RN  [email or web address]  Surgery Scheduler: Kristine Royal [phone] [email or web address]    Surgery Scheduling:  You will receive a phone call from the surgery schedulers regarding date for surgery in 1-2 weeks from  your appointment.    Please call one of the above numbers if you have not received a phone call 2 weeks after your appointment with the provider.      AFTER HOURS/HOLIDAYS:  For emergencies after-hours (after 5pm, or weekends): call the [facility] operator [phone]) and ask to page the Plastic Surgery resident on call. You will be directed to a surgery resident who likely is not immediately aware of the details of your case, but can help you deal with any emergencies that cannot wait until regular business hours.  Please be aware that this person is responding to many in-hospital emergencies and patient issues and may not answer your phone call immediately, but will return your call as soon as possible.    Financial Counselor: Deloria Lair  T: [phone]  F: [phone]  E-mail:  [email or web address]     Billing Questions/Financial Navigation:  [phone]    Precare Location:  [facility] Pre-Procedure Services at [facility])   [address]  [place], [place] [address] ([address] [place] - old [address] near [employer])  Blood Work Location:  [address], [place] [address]. There are no suite numbers, but it is located on the first floor there. Go to the main desk and tell them you are there for walk-in labs.  Precare:   The day prior to your scheduled surgery, Pre-Care will call you with instructions.  If you have not heard from them by 4PM, and would like to check on the status of your surgery, please call:  [facility]: [phone]  [facility]: [phone]  [facility]: [phone]  [place]:  [phone]    Weather Hotline:  [phone]    [facility] Imaging Contact Information: Please call to schedule  *Radiology (CT, X-ray, Ultrasound) [phone]  *Mammography & Breast Ultrasound [phone]  *MRI [phone]  *Interventional Radiology [phone]    [facility] forms and paperwork:  There is a $25 fee for each form that needs completed.  Please allow  a 2 week turn around for forms to be completed and faxed.

## 2020-12-18 NOTE — Unmapped (Signed)
[facility] THERAPY SERVICES   OUTPATIENT PHYSICAL THERAPY  12/18/2020  Note Type: Treatment Note       Patient Name: Tami Gibson  Date of Birth:Sep 02, 1990  Diagnosis:   Encounter Diagnoses   Name Primary?   ??? Lymphedema syndrome, postmastectomy Yes   ??? Scar condition and fibrosis of skin    ??? Malignant neoplasm of right breast in female, estrogen receptor positive, unspecified site of breast (CMS-HCC)      Referring MD:  Jobe Marker*     Date of Onset of Impairment-10/20/2020  Date PT Care Plan Established or Reviewed-10/30/2020  Date PT Treatment Started-10/30/2020   Plan of Care Effective Date: 10-30-20 to 01-28-21      Session #:  5      Contraindications:  none  Precautions:  none  Red Flags:  history of cancer, right breast IB    Metastatic cancer:   no      ASSESSMENT/Progress toward goals:  Pt is progressing toward meeting goals and Patient's skin integrity is improving.  Skin is softening with treatment which allows for improved lymph uptake, and decreased risk of infection Has gotten Jobst Bella Strong 20-30 sleeve and glove, RTW.  Using night garment and pump. Very mild cording.  Pt is doing an excellent job at self care.  Right arm continues to stay smaller than left UE.  Pt was able to go bowling over the weekend without an increase in arm swelling.       Recommendations for treatment/garments:  1) Patient will obtain proper garments to address swelling and improve tissue integrity. Garment recommendations : Mediven Harmony CCL 2 sleeve and glove, Jovi pak night time compression  2) Pt will be seen clinically for rehabilitation to include treatments to address as needed: edema/lymphedema, pain, ROM, strength, balance and endurance deficits, as well as learning self care strategies to address these deficits  3) Pt will require a pneumatic compression pump because despite conservative treatment ZO:[id number] garments or devices, exercise, elevation, skin care and self MLD,the patient exhibits the following symptoms:hyperplasia, hyperpigmentation, fibrosis, truncal/abdominal swelling, chest/axillary swelling and unable to control swelling    Patient requires skilled Physical Therapy services  for the following problem list and secondary functional limitations:    Problem List:   Uncontrolled swelling and/or lack of appropriate compression garment    Secondary Functional Limitations:  Uncontrolled swelling can increase chances of  infection, and limit functional ROM     Patient Goals: Decrease swelling and use as many devices as needed for self care so that I get this under control    Physical Therapy Goals:   In 1-3 months: Pt and/or caregiver will:  Be able to demonstrate self MLD, donning/doffing garments to manage swelling  MET 12-13-20  Decrease limb volume by 1-3 % for proper fit of clothing and to minimize infection risk MEt 12-13-20    In 6-12 months: Pt and/or caregiver will:  Retain an optimal limb volume that allows pt to fit into appropriate compression garments and clothing to improve functional use of UE and QOL    Prognosis for goal achievement:   Good due to previous success.      PLAN:   Pt to continue with current plan of care to educate in self care, precautions, and management of symptoms, optimal edema reduction/maintenance of girth, soft tissue changes, fitting of appropriate compression garments, and progression of exercises to optimize functional ROM, strength, balance and endurance in all ADL's(home, work, recreational, community)    Planned frequency and  duration  of treatment:   1 x week x 10 weeks    Next Visit Plan:  Take circ, MLD, MFR, lymphatouch, taping to hand prn      SUBJECTIVE:  Patient???s communication preference: verbal, written, visual, prn   History of Present Illness/ Pt reports:  Able to go bowling this weekend, arm did not swell    Location of pain: No pain        OBJECTIVE:      Posture/Observations:  Forward head, rounded shoulders     Range of Motion/Flexibilty:   UE WFL   Axillary Web Syndrome location: no     Strength/MMT:   UE WFL        Girth Measurements: taken in cm    Date: 5-31 5-31 6-23 6-23 7-14 7-14 7-19 7-19      Right Left RIght Left Right Left RIght Left RIght Left   MCP 16.8 16.5 19 18  16.5 16 16.7 16     Mid palm 18 18 19  18.5 18.5 17 18  16.4     Thumb DIP 6            1st finger DIP 4.9            1st finger PIP 6            2nd finger DIP 4.9            2nd finger PIP 5.9            3rd finger DIP 4.5            3rd finger PIP 5.3            4th finger DIP 4.0            4th finger PIP 4.5                         Axilla             Mid breast nipple line             Ribs below breast             Natural Waistline             Umbilicus             Hips at  cm below umbilicus             Limb Volume                        Location of swelling:  right hand , wrist, forearm, elbow, arm, shoulder, axilla and chest    Skin condition:  Stemmer???s sign:no.  increased skin thickness and fibrosis, softening with treatment  Incision/Scar:   incision healing well postoperatively    Sensation :  Peripheral neuropathy:no  Light touch:     Decreased sensation under axilla, around scar, and posterior lateral upper arm         Total Treatment Time: 53 min  Treatment Rendered:    Patient and/or caregiver and therapist were mask compliant per current [place] COVID mask policy during the entire treatment session.      Manual x 53 min   Axillary Web syndrome release , Scar massage, Lymphatouch to affected areas with simultaneous MFR and MLD to appropriate anastomoses in order to decrease swelling.  Application of compression garment/devices prn to prevent re-accumulation of fluid.  Equipment provided/recommended:   N/A   Communication/consultation with other professionals:  N/A    Referrals made to the following providers:  none    Medical Necessity: This treatment is medically necessary throughout the course of this patient's life during and after cancer treatments to improve functional activities and /or to minimize the decline of functional abilities and worsening of symptoms,including infection and recurrent hospitalizations, through independent/home management strategies.  Lymphedema, a chronic, progressive condition for which there is no cure, is marked by the accumulation of protein-rich fluid in one or more quadrants of the body due to primary or secondary disruption of the lymphatic system.  The sustained accumulation results in tissue inflammation, an increase in fatty tissue, and development of obstructive connective tissue.  These changes may result in an increased risk of infection, disfigurement and a decrease in mobility and functional performance.   Pt will benefit from physical therapy by a certified lymphedema therapist to address : education in self care, precautions, and management of symptoms, optimal edema reduction/maintenance of girth, optimal soft tissue changes, fitting of appropriate compression garments, and progression of exercises to optimize functional ROM and strength, balance, and endurance in all ADL's(home, work, recreational, community).      I attest that I have reviewed the above information.  Signed: Clementeen Graham, PT   12/18/2020 11:50 AM

## 2020-12-26 ENCOUNTER — Ambulatory Visit: Admit: 2020-12-26 | Discharge: 2020-12-27 | Payer: PRIVATE HEALTH INSURANCE

## 2020-12-26 ENCOUNTER — Institutional Professional Consult (permissible substitution): Admit: 2020-12-26 | Discharge: 2020-12-27 | Payer: PRIVATE HEALTH INSURANCE

## 2020-12-26 DIAGNOSIS — C50911 Malignant neoplasm of unspecified site of right female breast: Principal | ICD-10-CM

## 2020-12-26 DIAGNOSIS — Z170 Estrogen receptor positive status [ER+]: Principal | ICD-10-CM

## 2020-12-26 DIAGNOSIS — Z9889 Other specified postprocedural states: Principal | ICD-10-CM

## 2020-12-26 MED ADMIN — leuprolide (LUPRON) injection 3.75 mg: 3.75 mg | INTRAMUSCULAR | @ 14:00:00 | Stop: 2020-12-26

## 2020-12-26 NOTE — Unmapped (Signed)
Pt tolerated Lupron injection to L dorsogluteal without difficulty. Band aid and gauze placed over injection site. [facility] left [facility] ambulatory,steady gait, NAD, no questions, complaints, nor concerns voiced at d/c.

## 2020-12-26 NOTE — Unmapped (Signed)
[facility] Refill Coordination Note    Specialty Medication(s) to be Shipped:   Hematology/Oncology: Verzenio 100mg     Other medication(s) to be shipped: No additional medications requested for fill at this time     Tami Gibson, DOB: 17-Jul-1990  Phone: [phone] (home)       All above HIPAA information was verified with patient.     Was a Nurse, learning disability used for this call? No    Completed refill call assessment today to schedule patient's medication shipment from the [facility] [phone]).  All relevant notes have been reviewed.     Specialty medication(s) and dose(s) confirmed: Regimen is correct and unchanged.   Changes to medications: Tenia reports no changes at this time.  Changes to insurance: No  New side effects reported not previously addressed with a pharmacist or physician: None reported  Questions for the pharmacist: No    Confirmed patient received a Conservation officer, historic buildings and a Surveyor, mining with first shipment. The patient will receive a drug information handout for each medication shipped and additional FDA Medication Guides as required.       DISEASE/MEDICATION-SPECIFIC INFORMATION        N/A    SPECIALTY MEDICATION ADHERENCE     Medication Adherence    Patient reported X missed doses in the last month: 0  Specialty Medication: Verzenio 100mg   Patient is on additional specialty medications: No  Informant: patient              Were doses missed due to medication being on hold? No    Verzenio 100 mg: 10 days of medicine on hand       REFERRAL TO PHARMACIST     Referral to the pharmacist: Not needed      [facility]     Shipping address confirmed in Epic.     Delivery Scheduled: Yes, Expected medication delivery date: 01/02/21.     Medication will be delivered via Same Day Courier to the prescription address in Epic WAM.    [name]   [facility] Specialty Technician

## 2020-12-27 NOTE — Unmapped (Signed)
[facility]ERAP[facility] SERVICES [place]  OUTPATIENT PHYSICAL THERAPY  12/27/2020  Note Type: Treatment Note       Patient Name: Tami Gibson  Date of Birth:[birth year]  Diagnosis:   Encounter Diagnoses   Name Primary?   ??? Lymphedema syndrome, postmastectomy Yes   ??? Scar condition and fibrosis of skin    ??? Malignant neoplasm of right breast in female, estrogen receptor positive, unspecified site of breast (CMS-HCC)      Referring MD:  Jobe Marker*     Date of Onset of Impairment-10/20/2020  Date PT Care Plan Established or Reviewed-10/30/2020  Date PT Treatment Started-10/30/2020   Plan of Care Effective Date: 10-30-20 to 01-28-21      Session #:  6      Contraindications:  none  Precautions:  none  Red Flags:  history of cancer, right breast IB    Metastatic cancer:   no      ASSESSMENT/Progress toward goals:  Pt is progressing toward meeting goals and Patient's skin integrity is improving.  Skin is softening with treatment which allows for improved lymph uptake, and decreased risk of infection Has gotten Jobst Bella Strong 20-30 sleeve and glove, RTW.  Using night garment and pump. Very mild cording.  Pt is doing an excellent job at self care.  Right arm continues to stay smaller than left UE.  Pt was able to go participate in a weekend retreat with tubing and activities and was able to keep her swelling to a minimum.       Recommendations for treatment/garments:  1) Patient will obtain proper garments to address swelling and improve tissue integrity. Garment recommendations : Mediven Harmony CCL 2 sleeve and glove, Jovi pak night time compression  2) Pt will be seen clinically for rehabilitation to include treatments to address as needed: edema/lymphedema, pain, ROM, strength, balance and endurance deficits, as well as learning self care strategies to address these deficits  3) Pt will require a pneumatic compression pump because despite conservative treatment UE:[id number] garments or devices, exercise, elevation, skin care and self MLD,the patient exhibits the following symptoms:hyperplasia, hyperpigmentation, fibrosis, truncal/abdominal swelling, chest/axillary swelling and unable to control swelling    Patient requires skilled Physical Therapy services  for the following problem list and secondary functional limitations:    Problem List:   Uncontrolled swelling and/or lack of appropriate compression garment    Secondary Functional Limitations:  Uncontrolled swelling can increase chances of  infection, and limit functional ROM     Patient Goals: Decrease swelling and use as many devices as needed for self care so that I get this under control    Physical Therapy Goals:   In 1-3 months: Pt and/or caregiver will:  Be able to demonstrate self MLD, donning/doffing garments to manage swelling  MET 12-13-20  Decrease limb volume by 1-3 % for proper fit of clothing and to minimize infection risk MEt 12-13-20    In 6-12 months: Pt and/or caregiver will:  Retain an optimal limb volume that allows pt to fit into appropriate compression garments and clothing to improve functional use of UE and QOL      PLAN:   Pt to continue with current plan of care to educate in self care, precautions, and management of symptoms, optimal edema reduction/maintenance of girth, soft tissue changes, fitting of appropriate compression garments, and progression of exercises to optimize functional ROM, strength, balance and endurance in all ADL's(home, work, recreational, community)    Planned frequency and duration  of treatment:  1 x week x 10 weeks    Next Visit Plan:  Take circ, MLD, MFR, lymphatouch, taping to hand prn      SUBJECTIVE:  Patient???s communication preference: verbal, written, visual, prn   History of Present Illness/ Pt reports:  Able to participate in retreat  this weekend, arm did not swell    Location of pain: No pain        OBJECTIVE:      Posture/Observations:  Forward head, rounded shoulders     Range of Motion/Flexibilty: UE WFL   Axillary Web Syndrome location: no     Strength/MMT:   UE WFL        Girth Measurements: taken in cm    Date: 5-31 5-31 6-23 6-23 7-14 7-14 7-19 7-19 7-28 7-28    Right Left RIght Left Right Left RIght Left RIght Left   MCP 16.8 16.5 19 18  16.5 16 16.7 16 17 16    Mid palm 18 18 19  18.5 18.5 17 18  16.4 18 16.4   Thumb DIP 6        6    1st finger DIP 4.9        4.5    1st finger PIP 6        5.5    2nd finger DIP 4.9        4.6    2nd finger PIP 5.9        5.9    3rd finger DIP 4.5        4.4    3rd finger PIP 5.3        5.4    4th finger DIP 4.0        4.1    4th finger PIP 4.5        4.5                 Axilla             Mid breast nipple line             Ribs below breast             Natural Waistline             Umbilicus             Hips at  cm below umbilicus             Limb Volume                        Location of swelling:  right hand , wrist, forearm, elbow, arm, shoulder, axilla and chest    Skin condition:  Stemmer???s sign:no.  increased skin thickness and fibrosis, softening with treatment  Incision/Scar:   incision healing well postoperatively    Sensation :  Peripheral neuropathy:no  Light touch:     Decreased sensation under axilla, around scar, and posterior lateral upper arm         Total Treatment Time: 53 min  Treatment Rendered:    Patient and/or caregiver and therapist were mask compliant per current [place] COVID mask policy during the entire treatment session.      Manual x 53 min   Axillary Web syndrome release , Scar massage, Lymphatouch to affected areas with simultaneous MFR and MLD to appropriate anastomoses in order to decrease swelling.  Application of compression garment/devices prn to prevent re-accumulation of fluid.           Equipment provided/recommended:  N/A   Communication/consultation with other professionals:  N/A    Referrals made to the following providers:  none    Medical Necessity: This treatment is medically necessary throughout the course of this patient's life during and after cancer treatments to improve functional activities and /or to minimize the decline of functional abilities and worsening of symptoms,including infection and recurrent hospitalizations, through independent/home management strategies.  Lymphedema, a chronic, progressive condition for which there is no cure, is marked by the accumulation of protein-rich fluid in one or more quadrants of the body due to primary or secondary disruption of the lymphatic system.  The sustained accumulation results in tissue inflammation, an increase in fatty tissue, and development of obstructive connective tissue.  These changes may result in an increased risk of infection, disfigurement and a decrease in mobility and functional performance.   Pt will benefit from physical therapy by a certified lymphedema therapist to address : education in self care, precautions, and management of symptoms, optimal edema reduction/maintenance of girth, optimal soft tissue changes, fitting of appropriate compression garments, and progression of exercises to optimize functional ROM and strength, balance, and endurance in all ADL's(home, work, recreational, community).      I attest that I have reviewed the above information.  Signed: Clementeen Graham, PT   12/27/2020 4:01 PM

## 2021-01-01 MED FILL — VERZENIO 100 MG TABLET: ORAL | 28 days supply | Qty: 56 | Fill #1

## 2021-01-07 NOTE — Unmapped (Signed)
[facility] THERAPY SERVICES   OUTPATIENT PHYSICAL THERAPY  01/07/2021  Note Type: Treatment Note       Patient Name: Tami Gibson  Date of Birth:1990/12/30  Diagnosis:   Encounter Diagnoses   Name Primary?   ??? Lymphedema syndrome, postmastectomy Yes   ??? Scar condition and fibrosis of skin    ??? Malignant neoplasm of right breast in female, estrogen receptor positive, unspecified site of breast (CMS-HCC)      Referring MD:  Jobe Marker*     Date of Onset of Impairment-10/20/2020  Date PT Care Plan Established or Reviewed-10/30/2020  Date PT Treatment Started-10/30/2020   Plan of Care Effective Date: 10-30-20 to 01-28-21      Session #:  7      Contraindications:  none  Precautions:  none  Red Flags:  history of cancer, right breast IB    Metastatic cancer:   no      ASSESSMENT/Progress toward goals:  Pt is progressing toward meeting goals and Patient's skin integrity is improving.  Skin is softening with treatment which allows for improved lymph uptake, and decreased risk of infection Has gotten Jobst Bella Strong 20-30 sleeve and glove, RTW.  Using night garment and pump. Very mild cording in axilla present.  ROM is WFL of right shoulder.   Pt is doing an excellent job at self care.  Right arm continues to stay smaller than left UE.  Pt was able [place]  [place].   Pt can wear old lighter sleeve if doing water sports out of water, if she feels she is swelling with those activities.     Recommendations for treatment/garments:  1) Patient will obtain proper garments to address swelling and improve tissue integrity. Garment recommendations : Mediven Harmony CCL 2 sleeve and glove, Jovi pak night time compression  2) Pt will be seen clinically for rehabilitation to include treatments to address as needed: edema/lymphedema, pain, ROM, strength, balance and endurance deficits, as well as learning self care strategies to address these deficits  3) Pt will require a pneumatic compression pump because despite conservative treatment ZO:[id number] garments or devices, exercise, elevation, skin care and self MLD,the patient exhibits the following symptoms:hyperplasia, hyperpigmentation, fibrosis, truncal/abdominal swelling, chest/axillary swelling and unable to control swelling    Patient requires skilled Physical Therapy services  for the following problem list and secondary functional limitations:    Problem List:   Uncontrolled swelling and/or lack of appropriate compression garment    Secondary Functional Limitations:  Uncontrolled swelling can increase chances of  infection, and limit functional ROM     Patient Goals: Decrease swelling and use as many devices as needed for self care so that I get this under control    Physical Therapy Goals:   In 1-3 months: Pt and/or caregiver will:  Be able to demonstrate self MLD, donning/doffing garments to manage swelling  MET 12-13-20  Decrease limb volume by 1-3 % for proper fit of clothing and to minimize infection risk MEt 12-13-20    In 6-12 months: Pt and/or caregiver will:  Retain an optimal limb volume that allows pt to fit into appropriate compression garments and clothing to improve functional use of UE and QOL      PLAN:   Pt to continue with current plan of care to educate in self care, precautions, and management of symptoms, optimal edema reduction/maintenance of girth, soft tissue changes, fitting of appropriate  compression garments, and progression of exercises to optimize functional ROM, strength, balance and endurance in all ADL's(home, work, recreational, community)    Planned frequency and duration  of treatment:   1 x week x 10 weeks    Next Visit Plan:  Take circ after airflight, MLD, MFR, lymphatouch, taping to hand prn      SUBJECTIVE:  Patient???s communication preference: verbal, written, visual, prn   History of Present Illness/ Pt reports:  Able to participate in tubing and trial of kneeboarding  this weekend, arm did not swell  Flying to [place] in a few weeks    Location of pain: No pain        OBJECTIVE:      Posture/Observations:  Forward head, rounded shoulders     Range of Motion/Flexibilty:   UE WFL   Axillary Web Syndrome location: no     Strength/MMT:   UE WFL        Girth Measurements: taken in cm    Date: 8-8 8-8            Right Left RIght Left Right Left RIght Left RIght Left   MCP 16.2 16           Mid palm 17 16.6           Thumb DIP             1st finger DIP             1st finger PIP             2nd finger DIP             2nd finger PIP             3rd finger DIP             3rd finger PIP             4th finger DIP             4th finger PIP                          Axilla             Mid breast nipple line             Ribs below breast             Natural Waistline             Umbilicus             Hips at  cm below umbilicus             Limb Volume                        Location of swelling:  right hand , wrist, forearm, elbow, arm, shoulder, axilla and chest    Skin condition:  Stemmer???s sign:no.  increased skin thickness and fibrosis, softening with treatment  Incision/Scar:   incision healing well postoperatively    Sensation :  Peripheral neuropathy:no  Light touch:     Decreased sensation under axilla, around scar, and posterior lateral upper arm         Total Treatment Time: 53 min  Treatment Rendered:    Patient and/or caregiver and therapist were mask compliant per current [place] COVID mask policy during the entire treatment session.      Manual x 53 min  Axillary Web syndrome release , Scar massage, Lymphatouch to affected areas with simultaneous MFR and MLD to appropriate anastomoses in order to decrease swelling.  Application of compression garment/devices prn to prevent re-accumulation of fluid. Tape to scar          Equipment provided/recommended:   kinesio tape   Communication/consultation with other professionals:  N/A    Referrals made to the following providers:  none    Medical Necessity: This treatment is medically necessary throughout the course of this patient's life during and after cancer treatments to improve functional activities and /or to minimize the decline of functional abilities and worsening of symptoms,including infection and recurrent hospitalizations, through independent/home management strategies.  Lymphedema, a chronic, progressive condition for which there is no cure, is marked by the accumulation of protein-rich fluid in one or more quadrants of the body due to primary or secondary disruption of the lymphatic system.  The sustained accumulation results in tissue inflammation, an increase in fatty tissue, and development of obstructive connective tissue.  These changes may result in an increased risk of infection, disfigurement and a decrease in mobility and functional performance.   Pt will benefit from physical therapy by a certified lymphedema therapist to address : education in self care, precautions, and management of symptoms, optimal edema reduction/maintenance of girth, optimal soft tissue changes, fitting of appropriate compression garments, and progression of exercises to optimize functional ROM and strength, balance, and endurance in all ADL's(home, work, recreational, community).      I attest that I have reviewed the above information.  Signed: Clementeen Graham, PT   01/07/2021 8:35 AM

## 2021-01-08 ENCOUNTER — Ambulatory Visit: Admit: 2021-01-08 | Discharge: 2021-01-10 | Payer: PRIVATE HEALTH INSURANCE

## 2021-01-08 MED ADMIN — diazePAM (VALIUM) injection 2.5 mg: 2.5 mg | INTRAVENOUS | @ 17:00:00 | Stop: 2021-01-08

## 2021-01-08 MED ADMIN — diazePAM (VALIUM) injection 2.5 mg: 2.5 mg | INTRAVENOUS | @ 16:00:00 | Stop: 2021-01-08

## 2021-01-08 MED ADMIN — lidocaine (LMX) 4 % cream: TOPICAL | @ 16:00:00 | Stop: 2021-01-08

## 2021-01-08 MED ADMIN — Tc-99m Filtered Sulfur Colloid (fSC): 3 | INTRADERMAL | @ 17:00:00 | Stop: 2021-01-08

## 2021-01-08 NOTE — Unmapped (Signed)
The medicines given to you today for your procedure will stay in your body for some time. You may feel dizzy or lose your sense of balance. The use of your muscles may be changed. Your judgment may be affected. Your reaction time, for example, when driving a car, will be slower. You may not see any of these changes in yourself. In general, you should completely recover from these medicines by tomorrow. For your own safety, we have some strict rules for you to follow for the next 12 hours.     The 7D???s   Do not DRIVE.   Do not use appliances or equipment that could be DANGEROUS, like power tools, stove, burners, lawnmowers, garbage disposals.   Watch out for DIZZINESS. Walk slowly and take your time. Sudden changes of position can also cause nausea.   Do not make any important DECISIONS. You may change your mind tomorrow.   Do not DRINK alcoholic beverages. The drugs in your body may cause your reaction to alcohol to be dangerous.   DIET: If you feel nauseated or ???sick to your stomach???, drink clear liquids like 7-Up, broth, apple juice, ginger ale, tea, and cola or eat jello. If these liquids do not make you ???sick to your stomach???, try eating soft foods like potatoes, rice, pasta and cereal. Tomorrow you can eat regular foods.   DISCUSS any questions you may have with your primary care doctor or the doctor who ordered your test today.

## 2021-01-10 ENCOUNTER — Institutional Professional Consult (permissible substitution): Admit: 2021-01-10 | Discharge: 2021-01-11 | Payer: PRIVATE HEALTH INSURANCE

## 2021-01-10 MED ORDER — AMOXICILLIN 875 MG-POTASSIUM CLAVULANATE 125 MG TABLET
ORAL_TABLET | Freq: Two times a day (BID) | ORAL | 0 refills | 7.00000 days | Status: CP
Start: 2021-01-10 — End: 2021-01-17

## 2021-01-10 NOTE — Unmapped (Addendum)
If you have any problems with your prescription if you received one or any questions after your virtual practice visit please reach out to our clinic at [phone].    *I have diagnosed you with a sinus infection.   *This can be caused by a bacteria or a virus.  *We are covering for bacterial sinusitis and if symptoms are not improving in several days you may start the prescribed antibiotic.   *There are a few things that you can do to help relieve some of your sinus congestion:  -Application of moist heat to your face.  -Breathing in inhaled steam from a warm shower or a bowl of warm water.    -Also over-the-counter saline nasal spray to help clear the mucus.   -Antihistamines like Zyrtec or Claritin (generics are fine) help decrease the amount of mucus produced  -Adding steroid nasal spray like Flonase will decrease inflammation to the nasal passages.  -Mucinex (generic guaifenesin) which helps make secretions thinner and promote drainage    All the above medication recommendations are available over the counter.  *Also avoidance of tobacco smoke and environmental allergies will help with general health and decrease in nasal secretions.    *Pain control - Ibuprofen and Tylenol as needed.   *Also increase fluids and rest.  Follow-up with your primary care within 1 week to ensure symptoms are resolving.    *Return to care or go to the ER if your symptoms persist or worsen in any way.     *Thank you for choosing [facility]! It was my pleasure to participate in your medical care today.

## 2021-01-10 NOTE — Unmapped (Signed)
[facility] ENT Encounter  This medical encounter was conducted virtually using [email or web address] protocols.    Patient ID: Tami Gibson is a 30 y.o. female who presents by telephone interaction for evaluation.    I have identified myself to the patient and conveyed my credentials to [facility].   Patient has signed informed consent on file in medical record.    Present on Phone Call: Is there someone else in the room? No..    Assessment/Plan:    Diagnoses and all orders for this visit:    Rhinosinusitis  -     amoxicillin-clavulanate (AUGMENTIN) 875-125 mg per tablet; Take 1 tablet by mouth Two (2) times a day for 7 days.    Patient's COVID-19 test was negative today.  Today is day 6 of symptoms and that home test should be accurate.  Discussed ongoing symptomatic care for patient for several more days and if not improving may fill and start the Augmentin.  Did discuss bacterial pneumonia.  It seems unlikely based on what she is describing to me but did discuss if she starts running a fever, worsening cough or shortness of breath she should go to urgent care for chest x-ray.  Patient vocalized understanding and all questions answered at this time.    -- Discussed the new prescription noted above, including potential side effects, drug interactions, instructions for taking the medication, and the consequences of not taking it.  -- Patient verbalized an understanding of today's assessment and recommendations, as well as the purpose of ongoing medications.    Referred to UC for further evaluation.  ER not deemed necessary at this time.  For any worsening of current symptoms including onset of fever, shortness of breath or worsening cough    Medication adherence and barriers to the treatment plan have been addressed. Opportunities to optimize healthy behaviors have been discussed. Patient / caregiver voiced understanding.        Subjective:     HPI  Tami Gibson is 30 y.o. and presents today in the [facility] with ENT symptoms.  The PCP for this patient is Durenda Hurt, MD. Pt reports onset of sinus congestion and cough since 8/9. Pt reports her voice is more hoarse than usual now. Cough is more productive now. No fever, no SOB. Has not done a COVID-19 test home test yet but has one that she can do now.        ROS  Review of Systems     All other ROS per HPI.    I have reviewed the problem list, past medical history, past family history, medications, and allergies and have updated/reconciled them if needed.            Objective:   This visit was not converted from a video visit to a telephone visit for the following reason: Not applicable    As part of this Telephone Visit, no in-person exam was conducted.             The patient reports they are currently: at home. I spent 11 minutes on the phone with the patient on the date of service. I spent an additional 5 minutes on pre- and post-visit activities on the date of service.     The patient was physically located in [place] or a state in which I am permitted to provide care. The patient and/or parent/guardian understood that s/he may incur co-pays and cost sharing, and agreed to the telemedicine visit. The visit  was reasonable and appropriate under the circumstances given the patient's presentation at the time.    The patient and/or parent/guardian has been advised of the potential risks and limitations of this mode of treatment (including, but not limited to, the absence of in-person examination) and has agreed to be treated using telemedicine. The patient's/patient's family's questions regarding telemedicine have been answered.     If the visit was completed in an ambulatory setting, the patient and/or parent/guardian has also been advised to contact their provider???s office for worsening conditions, and seek emergency medical treatment and/or call 911 if the patient deems either necessary.

## 2021-01-14 ENCOUNTER — Telehealth
Admit: 2021-01-14 | Discharge: 2021-01-15 | Payer: PRIVATE HEALTH INSURANCE | Attending: Internal Medicine | Primary: Internal Medicine

## 2021-01-14 DIAGNOSIS — J069 Acute upper respiratory infection, unspecified: Principal | ICD-10-CM

## 2021-01-14 NOTE — Unmapped (Signed)
TeleHealth Video Encounter  This medical encounter was conducted virtually using [email or web address]  TeleHealth protocols.    Patient ID: Tami Gibson is a 30 y.o. female who presents by video interaction for evaluation of URI    Present on Video Call: patient at home    Assessment/Plan:      1. Upper respiratory tract infection, unspecified type  Likely viral URI since Augmentin is not making much of a difference. Still given her high risk, advised to complete 7 day course of antibiotics. Counseled on post-viral cough and advised to use expectorant like Mucinex only during first part of day. At night switch to pure cough suppressant like Delsym. May also add naproxen and Benadryl at bedtime for nighttime cough control. Continue aggressive hydration and rest.       Preventive services addressed today  We did not review preventive services today    -- Patient verbalized an understanding of today's assessment and recommendations, as well as the purpose of ongoing medications.    No follow-ups on file.    Medication adherence and barriers to the treatment plan have been addressed. Opportunities to optimize healthy behaviors have been discussed. Patient / caregiver voiced understanding.        Subjective:     HPI  -Symptoms started 8 days ago with typical cold. Currently having a lot of cough that's waking her up at night. Cough is mostly dry but occasionally coughs out yellow mucous. Feels ongoing nasal congestion and runny nose and post-nasal drip. This is better somewhat but feels congested in chest. Did see urgent care televisit 5 days ago and was prescribed Augmentin. Doesn't seem to be helping that much.  Taking Mucinex and robitussin as well. No fevers or chills.     ROS  As per HPI.    The following portions of the patient's history were reviewed and updated as appropriate: allergies, current medications, past medical history, past social history and problem list.          Objective:     As part of this Video Visit, no in-person exam was conducted.  Video interaction permitted the following observations.    General: No acute distress  RESP: Relaxed respiratory effort.   NEURO: Normal coordination.  No tremors observed.  PSYCH: Alert and oriented.  Speech fluent and sensible.  Calm affect.                The patient reports they are currently: at home. I spent 7 minutes on the real-time audio and video with the patient on the date of service. I spent an additional 2 minutes on pre- and post-visit activities on the date of service.     The patient was physically located in [place] or a state in which I am permitted to provide care. The patient and/or parent/guardian understood that s/he may incur co-pays and cost sharing, and agreed to the telemedicine visit. The visit was reasonable and appropriate under the circumstances given the patient's presentation at the time.    The patient and/or parent/guardian has been advised of the potential risks and limitations of this mode of treatment (including, but not limited to, the absence of in-person examination) and has agreed to be treated using telemedicine. The patient's/patient's family's questions regarding telemedicine have been answered.     If the visit was completed in an ambulatory setting, the patient and/or parent/guardian has also been advised to contact their provider???s office for worsening conditions, and seek emergency medical treatment and/or call 911 if  the patient deems either necessary.

## 2021-01-14 NOTE — Unmapped (Signed)
Called Treva to inquire if she is still seeing benefit from the nighttime lymphedema medical equipment Network engineer). I received inquiry from medical equipment company requesting re-justification for insurance purposes.    Tami Gibson indicates that it has been very helpful and that she noticed a big difference when she did an all day outdoor camp in July (vs. The bad swelling/exacerbation she experienced in May/June). She will soon be traveling for business by airplane and understands that will be the true test per her PT.    Angelo also inquired about ways to manage vaginal dryness/dyspareunia. We spoke briefly about lube + moisture. She is using lube with only a bit of relief. She is not sure if it is water based or not. Recommended trying a silicone based lube to reduce friction longer. Regarding moisturization of vaginal tissues, recommended applying organic coconut oil 2-3x/week after showering. Prarthana will reach out if she wants to schedule a longer, more formal appointment.    Mariana Kaufman, AGPCNP-BC  Nurse Practitioner  Adolescent and Young Adult Cancer Program  [facility]  01/14/2021

## 2021-01-21 ENCOUNTER — Institutional Professional Consult (permissible substitution): Admit: 2021-01-21 | Discharge: 2021-01-22 | Payer: PRIVATE HEALTH INSURANCE

## 2021-01-21 DIAGNOSIS — Z170 Estrogen receptor positive status [ER+]: Principal | ICD-10-CM

## 2021-01-21 DIAGNOSIS — C50911 Malignant neoplasm of unspecified site of right female breast: Principal | ICD-10-CM

## 2021-01-21 MED ADMIN — leuprolide (LUPRON) 3.75 mg injection: @ 20:00:00 | Stop: 2021-01-21

## 2021-01-21 NOTE — Unmapped (Signed)
Lupron administered in right buttock.

## 2021-01-22 DIAGNOSIS — J310 Chronic rhinitis: Principal | ICD-10-CM

## 2021-01-22 DIAGNOSIS — J329 Chronic sinusitis, unspecified: Principal | ICD-10-CM

## 2021-01-22 MED ORDER — AMOXICILLIN 875 MG-POTASSIUM CLAVULANATE 125 MG TABLET
ORAL_TABLET | Freq: Two times a day (BID) | ORAL | 0 refills | 7.00000 days | Status: CP
Start: 2021-01-22 — End: 2021-01-29

## 2021-01-24 NOTE — Unmapped (Signed)
[facility] Refill Coordination Note    Specialty Medication(s) to be Shipped:   Hematology/Oncology: Verzenio 100mg     Other medication(s) to be shipped: No additional medications requested for fill at this time     Ressie Gibson, DOB: 10-20-1990  Phone: [phone] (home)       All above HIPAA information was verified with patient.     Was a Nurse, learning disability used for this call? No    Completed refill call assessment today to schedule patient's medication shipment from the [facility] [phone]).  All relevant notes have been reviewed.     Specialty medication(s) and dose(s) confirmed: Regimen is correct and unchanged.   Changes to medications: Tami Gibson reports no changes at this time.  Changes to insurance: No  New side effects reported not previously addressed with a pharmacist or physician: None reported  Questions for the pharmacist: No    Confirmed patient received a Conservation officer, historic buildings and a Surveyor, mining with first shipment. The patient will receive a drug information handout for each medication shipped and additional FDA Medication Guides as required.       DISEASE/MEDICATION-SPECIFIC INFORMATION        N/A    SPECIALTY MEDICATION ADHERENCE     Medication Adherence    Patient reported X missed doses in the last month: 0  Specialty Medication: Verzenio 100mg   Patient is on additional specialty medications: No  Informant: patient              Were doses missed due to medication being on hold? No    Verzenio 100 mg: 7 days of medicine on hand       REFERRAL TO PHARMACIST     Referral to the pharmacist: Not needed      [facility]     Shipping address confirmed in Epic.     Delivery Scheduled: Yes, Expected medication delivery date: 01/28/21.     Medication will be delivered via Same Day Courier to the prescription address in Epic WAM.    [name]   [facility] Specialty Technician

## 2021-01-28 MED FILL — VERZENIO 100 MG TABLET: ORAL | 28 days supply | Qty: 56 | Fill #2

## 2021-01-29 ENCOUNTER — Ambulatory Visit
Admit: 2021-01-29 | Payer: PRIVATE HEALTH INSURANCE | Attending: Rehabilitative and Restorative Service Providers" | Primary: Rehabilitative and Restorative Service Providers"

## 2021-01-29 NOTE — Unmapped (Signed)
[facility] THER[facility] SERVICES   OUTPATIENT PHYSICAL THERAPY  01/29/2021  Note Type: Treatment Note       Patient Name: Tami Gibson  Date of Birth:1991/03/04  Diagnosis:   Encounter Diagnoses   Name Primary?   ??? Lymphedema syndrome, postmastectomy Yes   ??? Scar condition and fibrosis of skin    ??? Malignant neoplasm of right breast in female, estrogen receptor positive, unspecified site of breast (CMS-HCC)      Referring MD:  Jobe Marker*     Date of Onset of Impairment-10/20/2020  Date PT Care Plan Established or Reviewed-10/30/2020  Date PT Treatment Started-10/30/2020   Plan of Care Effective Date: 10-30-20 to 01-28-21; 01-29-21 to 04-29-21      Session #:  8      Contraindications:  none  Precautions:  none  Red Flags:  history of cancer, right breast IB    Metastatic cancer:   no      ASSESSMENT/Progress toward goals:  Pt is progressing toward meeting goals, Patient's skin integrity is improving.  Skin is softening with treatment which allows for improved lymph uptake, and decreased risk of infection, Pt is demonstrating proficiency at self care of edema/lymphedema and Pt is demonstrating continued limb/truncal swelling reduction and improve tissue integrity   Pt continues to do  an excellent job at self care.  Right arm continues to stay smaller than left UE.  Pt was able to fly on aircraft to [facility] without worsening lymphedema .  Recommend see in one month, rather than weekly.  Has not contacted [employer] yet for strengthening.    Recommendations for treatment/garments:  1) Patient will obtain proper garments to address swelling and improve tissue integrity. Garment recommendations : Mediven Harmony CCL 2 sleeve and glove, Jovi pak night time compression  2) Pt will be seen clinically for rehabilitation to include treatments to address as needed: edema/lymphedema, pain, ROM, strength, balance and endurance deficits, as well as learning self care strategies to address these deficits  3) Pt will require a pneumatic compression pump because despite conservative treatment ZO:[id number] garments or devices, exercise, elevation, skin care and self MLD,the patient exhibits the following symptoms:hyperplasia, hyperpigmentation, fibrosis, truncal/abdominal swelling, chest/axillary swelling and unable to control swelling    Patient requires skilled Physical Therapy services  for the following problem list and secondary functional limitations:    Problem List:   Uncontrolled swelling and/or lack of appropriate compression garment    Secondary Functional Limitations:  Uncontrolled swelling can increase chances of  infection, and limit functional ROM     Patient Goals: Decrease swelling and use as many devices as needed for self care so that I get this under control    Physical Therapy Goals:       In 6-12 months: Pt and/or caregiver will:  Retain an optimal limb volume that allows pt to fit into appropriate compression garments and clothing to improve functional use of UE and QOL      PLAN:   Pt to continue with current plan of care to educate in self care, precautions, and management of symptoms, optimal edema reduction/maintenance of girth, soft tissue changes, fitting of appropriate compression garments, and progression of exercises to optimize functional ROM, strength, balance and endurance in all ADL's(home, work, recreational, community)    Planned frequency and duration  of treatment:   See in one month    Next Visit Plan:  Take circ , prep for discharge if no significant change  MLD, MFR, lymphatouch,  SUBJECTIVE:  Patient???s communication preference: verbal, written, visual, prn   History of Present Illness/ Pt reports:  [place] trip went well, no pain , no swelling     Location of pain: No pain        OBJECTIVE:      Posture/Observations:  Forward head, rounded shoulders     Range of Motion/Flexibilty:   UE WFL   Axillary Web Syndrome location: no     Strength/MMT:   UE WFL        Girth Measurements: taken in cm    Date: 8-8 8-8 8-30 8-30          Right Left RIght Left Right Left RIght Left RIght Left   MCP 16.2 16 17 16          Mid palm 17 16.6 17.5 17.6         Thumb DIP             1st finger DIP             1st finger PIP             2nd finger DIP             2nd finger PIP             3rd finger DIP             3rd finger PIP             4th finger DIP             4th finger PIP                          Axilla             Mid breast nipple line             Ribs below breast             Natural Waistline             Umbilicus             Hips at  cm below umbilicus             Limb Volume                        Location of swelling:  right hand , wrist, forearm, elbow, arm, shoulder, axilla and chest    Skin condition:  Stemmer???s sign:no.  increased skin thickness and fibrosis, softening with treatment  Incision/Scar:   incision healing well postoperatively    Sensation :  Peripheral neuropathy:no  Light touch:     Decreased sensation under axilla, around scar, and posterior lateral upper arm         Total Treatment Time: 53 min  Treatment Rendered:    Patient and/or caregiver and therapist were mask compliant per current Center COVID mask policy during the entire treatment session.      Manual x 53 min   Axillary Web syndrome release , Scar massage, Lymphatouch to affected areas with simultaneous MFR and MLD to appropriate anastomoses in order to decrease swelling.  Application of compression garment/devices prn to prevent re-accumulation of fluid.           Equipment provided/recommended:   N/A   Communication/consultation with other professionals:  N/A    Referrals made to the following providers:  none    Medical Necessity:  This treatment is medically necessary throughout the course of this patient's life during and after cancer treatments to improve functional activities and /or to minimize the decline of functional abilities and worsening of symptoms,including infection and recurrent hospitalizations, through independent/home management strategies.  Lymphedema, a chronic, progressive condition for which there is no cure, is marked by the accumulation of protein-rich fluid in one or more quadrants of the body due to primary or secondary disruption of the lymphatic system.  The sustained accumulation results in tissue inflammation, an increase in fatty tissue, and development of obstructive connective tissue.  These changes may result in an increased risk of infection, disfigurement and a decrease in mobility and functional performance.   Pt will benefit from physical therapy by a certified lymphedema therapist to address : education in self care, precautions, and management of symptoms, optimal edema reduction/maintenance of girth, optimal soft tissue changes, fitting of appropriate compression garments, and progression of exercises to optimize functional ROM and strength, balance, and endurance in all ADL's(home, work, recreational, community).      I attest that I have reviewed the above information.  Signed: Clementeen Graham, PT   01/29/2021 11:32 AM

## 2021-02-04 NOTE — Unmapped (Unsigned)
Radiation Oncology Follow Up Visit Note    Encounter Date: 02/06/2021  Patient Name: Tami Gibson  Patient Age: 30 y.o.  Patient DOB: [birth year]    Primary Care Provider: Durenda Hurt, MD    Referring Physician: Larna Daughters Abdou    Diagnosis: No diagnosis found.    Cancer Staging  Malignant neoplasm of right breast in female, estrogen receptor positive (CMS-HCC)  Staging form: Breast, AJCC 8th Edition  - Clinical: Stage IB (cT1c, cN1, cM0, G1, ER+, PR+, HER2-) - Signed by [name], MD on 12/02/2019  - Pathologic stage from 04/20/2020: No Stage Recommended (ypT1c, pN1a, cM0, G2, ER+, PR+, HER2-) - Signed by [name], MD on 04/25/2020      Treatment Site: Right chest wall and regional nodes  50Gy/5fx    Duration since treatment: ~6 months (completed 08/06/2020)    Assessment:  30 year old woman with a cT1cN1 IDC of the right breast (ER/PR pos, HER2 neg) s/p neoadjuvant ddACT followed by right simple mastectomy (immediate recon) with TAD-- > ALND with residual 1.4cm of disesae, RCB-III, excised with negative margins, and 3/19 total nodes, largest 9mm with ECE present (ypT1cN1a). She is now s/p adjuvant PMRT to the right chest wall and nodes 50Gy/23fx completed on 08/06/2020    - Last evaluated by medical oncology on 12/12/2020 with NED  - She had restaging scans on 09/19/20 NM Bone, CT CAP which showed NED  - Todays Exam shows ***  - Symptomatically ***       Plan:  - Follow up with medical oncology on 03/13/2021  - Follow up with surgical oncology scheduled on 05/20/2021  - Follow up with our clinic in March 2023  - Continue ovarian suppression with lupron and letrozole per medical oncology  - Continue abemaciclib per medical oncology      Interval History:  ***    A comprehensive review of 10 systems was negative except for pertinent positives noted in the interval history.    Physical Exam:  There were no vitals taken for this visit.  Karnofsky/Lansky Performance Status:  {Karnofsky/Lansky Performance Status:22908}  General/Constitutional: Well-appearing, NAD   HEENT: Normocephalic, atraumatic, no scleral icterus   Skin: No suspicious lesions or rashes  Pulmonary: No respiratory distress or increased work of breathing   Abdominal: Non distended  Musculoskeletal: Full range of motion in the extremities, without edema   Neurologic: Alert and oriented to conversation  Psychiatric: Appropriate affect and judgement  Lymphatic: No preauricular, cervical or supraclavicular lymphadenopathy.  Breast: ***  GU: ***  Pelvis: External Genitalia ***   Speculum ***   Digital ***    Imaging:  ***    Pathology:  ***      Electronically Signed:  Sunday Shams, MD  Radiation Oncology Pgy-3  [facility]

## 2021-02-13 ENCOUNTER — Telehealth: Admit: 2021-02-13 | Discharge: 2021-02-14 | Payer: PRIVATE HEALTH INSURANCE | Attending: Clinical | Primary: Clinical

## 2021-02-14 ENCOUNTER — Ambulatory Visit: Admit: 2021-02-14 | Discharge: 2021-02-15 | Payer: PRIVATE HEALTH INSURANCE

## 2021-02-14 DIAGNOSIS — Z20822 Contact with and (suspected) exposure to covid-19: Principal | ICD-10-CM

## 2021-02-14 NOTE — Unmapped (Signed)
A swab for Covid-19 testing was obtained and sent to the lab per [facility] Policy

## 2021-02-15 DIAGNOSIS — C50911 Malignant neoplasm of unspecified site of right female breast: Principal | ICD-10-CM

## 2021-02-15 DIAGNOSIS — Z170 Estrogen receptor positive status [ER+]: Principal | ICD-10-CM

## 2021-02-15 NOTE — Unmapped (Signed)
Tami Gibson  Comprehensive Cancer Support Program   Telehealth Encounter    *non billable encounter*      Encounter Description: This encounter was conducted via telephone in the setting of State of Emergency due to COVID-19 Pandemic.     The patient reports they are currently: not at home. I spent 40 minutes on the phone with the patient on the date of service. I spent an additional 15 minutes on pre- and post-visit activities on the date of service.     The patient was physically located in [place] or a state in which I am permitted to provide Gibson. The patient and/or parent/guardian understood that s/he may incur co-pays and cost sharing, and agreed to the telemedicine visit. The visit was reasonable and appropriate under the circumstances given the patient's presentation at the time.    The patient and/or parent/guardian has been advised of the potential risks and limitations of this mode of treatment (including, but not limited to, the absence of in-person examination) and has agreed to be treated using telemedicine. The patient's/patient's family's questions regarding telemedicine have been answered.     If the visit was completed in an ambulatory setting, the patient and/or parent/guardian has also been advised to contact their provider???s office for worsening conditions, and seek emergency medical treatment and/or call 911 if the patient deems either necessary.      Assessment:  Tami Gibson is a 30 y.o. female with a history of breast cancer. Referred by Luetta Nutting, PT for support navigating survivorship and long term impact of treatment (lymphedema). She describes a normal and healthy emotional response to treatment and is interested in engaging in therapy as she transitions from active treatment to survivorship.     Risk Assessment:  A suicide and violence risk assessment was performed as part of this evaluation. There is no acute risk for suicide or violence at this time.  While future psychiatric events cannot be accurately predicted, the patient does not currently require acute inpatient psychiatric Gibson and does not currently meet [facility] involuntary commitment criteria.       Plan:  Will continue to follow for supportive counseling.      Subjective:   Patient interviewed in a private place, accompanied by no one. She shared that she is doing well overall. Flew to [facility] and didn't experience increase in lymphedema, which had been a concern. Reports feeling like she has established a new normal and her weeks aren't interrupted by multiple doctors appointments. Denies anxiety or distress related to cancer history. Has reached out to local support groups and is looking forward to some in-person events to continue building her community of support. Provided supportive counseling, active listening, normalization, and psychoeducation. Pt engaged and open to meeting again.       Objective:    Mental Status Exam:  Speech/Language:    Normal rate, volume, tone, fluency   Mood:   euthymic   Thought process and Associations:   Logical, linear, clear, coherent, goal directed   Abnormal/psychotic thought content:     Denies SI, HI, self harm, delusions, obsessions, paranoid ideation, or ideas of reference   Perceptual disturbances:     Does not endorse auditory or visual hallucinations     Orientation:   Oriented to person, place, time, and general circumstances   Insight:     Intact   Judgment:    Intact   Impulse Control:   Intact     [name], LCSW  Comprehensive Cancer Support Program  Phone: [phone]  Pager: [phone]

## 2021-02-18 ENCOUNTER — Institutional Professional Consult (permissible substitution): Admit: 2021-02-18 | Discharge: 2021-02-19 | Payer: PRIVATE HEALTH INSURANCE

## 2021-02-18 DIAGNOSIS — C50911 Malignant neoplasm of unspecified site of right female breast: Principal | ICD-10-CM

## 2021-02-18 DIAGNOSIS — Z170 Estrogen receptor positive status [ER+]: Principal | ICD-10-CM

## 2021-02-18 MED ADMIN — leuprolide (LUPRON) injection 3.75 mg: 3.75 mg | INTRAMUSCULAR | @ 15:00:00 | Stop: 2021-02-18

## 2021-02-18 MED ADMIN — leuprolide (LUPRON) 3.75 mg injection: INTRAMUSCULAR | @ 15:00:00 | Stop: 2021-02-18

## 2021-02-18 NOTE — Unmapped (Signed)
[facility] Clinical Assessment & Refill Coordination Note    Tami Gibson, Tami Gibson: April 09, 1991  Phone: [phone] (home)     All above HIPAA information was verified with patient.     Was a Nurse, learning disability used for this call? No    Specialty Medication(s):   Hematology/Oncology: Verzenio 100mg      Current Outpatient Medications   Medication Sig Dispense Refill   ??? abemaciclib (VERZENIO) 100 mg Tab tablet Take 1 tablet (100 mg total) by mouth Two (2) times a day. Take with or without food. 56 tablet 5   ??? acetaminophen (TYLENOL) 325 MG tablet Take 650 mg by mouth every six (6) hours as needed for pain.     ??? calcium carbonate (TUMS) 200 mg calcium (500 mg) chewable tablet Chew 1 tablet daily.     ??? docusate sodium (COLACE) 100 MG capsule Take 100 mg by mouth nightly as needed (PRN).     ??? ergocalciferol-1,250 mcg, 50,000 unit, (DRISDOL) 1,250 mcg (50,000 unit) capsule Take 1,250 mcg by mouth once a week.     ??? escitalopram oxalate (LEXAPRO) 10 MG tablet Take 1 tablet (10 mg total) by mouth in the morning. 30 tablet 5   ??? fexofenadine (ALLEGRA) 180 MG tablet Take 180 mg by mouth.     ??? letrozole (FEMARA) 2.5 mg tablet Take 1 tablet (2.5 mg total) by mouth daily. 90 tablet 3   ??? omeprazole (PRILOSEC) 20 MG capsule Take 1 capsule (20 mg total) by mouth daily. 90 capsule 3   ??? ondansetron (ZOFRAN-ODT) 4 MG disintegrating tablet Take 1 tablet (4 mg total) by mouth every eight (8) hours as needed for nausea. 30 tablet 3   ??? rimegepant (NURTEC ODT) 75 mg TbDL Take 75 mg by mouth.      ??? SUMAtriptan (IMITREX) 100 MG tablet TAKE ONE TABLET BY MOUTH AT ONSET OF MIGRAINE FOR UP TO 1 DOSE . IF SYMPTOMS PERSIST A SECOND DOSE MAY BE TAKEN IN 2 HOURS IF NEEDED     ??? topiramate (TOPAMAX) 25 MG tablet TAKE 3 TABLETS BY MOUTH ONCE DAILY     ??? topiramate (TOPAMAX) 50 MG tablet Take 50 mg by mouth Two (2) times a day.     ??? vitamin E-134 mg, 200 UNIT, 134 mg (200 UNIT) capsule Take 134 mg by mouth daily. No current facility-administered medications for this visit.     Facility-Administered Medications Ordered in Other Visits   Medication Dose Route Frequency Provider Last Rate Last Admin   ??? leuprolide (LUPRON) 3.75 mg injection                 Changes to medications: Senaida reports no changes at this time.    No Known Allergies    Changes to allergies: No    SPECIALTY MEDICATION ADHERENCE     Verzenio 100 mg: 10 days of medicine on hand     Medication Adherence    Patient reported X missed doses in the last month: 0  Specialty Medication: Verzenio 100mg           Specialty medication(s) dose(s) confirmed: Regimen is correct and unchanged.     Are there any concerns with adherence? No    Adherence counseling provided? Not needed    CLINICAL MANAGEMENT AND INTERVENTION      Clinical Benefit Assessment:    Do you feel the medicine is effective or helping your condition? Yes    Clinical Benefit counseling provided? Not needed  Adverse Effects Assessment:    Are you experiencing any side effects? No    Are you experiencing difficulty administering your medicine? No    Quality of Life Assessment:    Quality of Life      Oncology  1. What impact has your specialty medication had on the reduction of your daily pain or discomfort level?: None  2. On a scale of 1-10, how would you rate your ability to manage side effects associated with your specialty medication? (1=no issues, 10 = unable to take medication due to side effects): 1            How many days over the past month did your condition/medication  keep you from your normal activities? For example, brushing your teeth or getting up in the morning. 0    Have you discussed this with your provider? Not needed    Acute Infection Status:    Acute infections noted within Epic:  No active infections  Patient reported infection: None    Therapy Appropriateness:    Is therapy appropriate? Yes, therapy is appropriate and should be continued    DISEASE/MEDICATION-SPECIFIC INFORMATION      N/A    PATIENT SPECIFIC NEEDS     - Does the patient have any physical, cognitive, or cultural barriers? No    - Is the patient high risk? Yes, patient is taking oral chemotherapy. Appropriateness of therapy as been assessed    - Does the patient require a Care Management Plan? No     - Does the patient require physician intervention or other additional services (i.e. nutrition, smoking cessation, social work)? No      SHIPPING     Specialty Medication(s) to be Shipped:   Hematology/Oncology: Verzenio 100mg     Other medication(s) to be shipped: No additional medications requested for fill at this time     Changes to insurance: No    Delivery Scheduled: Yes, Expected medication delivery date: 02/25/21.     Medication will be delivered via Same Day Courier to the confirmed prescription address in [place].    The patient will receive a drug information handout for each medication shipped and additional FDA Medication Guides as required.  Verified that patient has previously received a Conservation officer, historic buildings and a Surveyor, mining.    The patient or caregiver noted above participated in the development of this care plan and knows that they can request review of or adjustments to the care plan at any time.      All of the patient's questions and concerns have been addressed.    Rollen Sox   [facility] Specialty Pharmacist

## 2021-02-18 NOTE — Unmapped (Signed)
1131 Administered Lupron and patient tolerated well. Applied band-aid .

## 2021-02-25 ENCOUNTER — Ambulatory Visit
Admit: 2021-02-25 | Payer: PRIVATE HEALTH INSURANCE | Attending: Rehabilitative and Restorative Service Providers" | Primary: Rehabilitative and Restorative Service Providers"

## 2021-02-25 MED FILL — VERZENIO 100 MG TABLET: ORAL | 28 days supply | Qty: 56 | Fill #3

## 2021-02-26 NOTE — Unmapped (Signed)
[facility]ERAP[facility] SERVICES [place]  OUTPATIENT PHYSICAL THERAPY  02/25/2021  Note Type: Treatment Note       Patient Name: Tami Gibson  Date of Birth:1990/09/11  Diagnosis:   Encounter Diagnoses   Name Primary?   ??? Lymphedema syndrome, postmastectomy Yes   ??? Scar condition and fibrosis of skin    ??? Malignant neoplasm of right breast in female, estrogen receptor positive, unspecified site of breast (CMS-HCC)      Referring MD:  Jobe Marker*     Date of Onset of Impairment-10/20/2020  Date PT Care Plan Established or Reviewed-10/30/2020  Date PT Treatment Started-10/30/2020   Plan of Care Effective Date: 10-30-20 to 01-28-21; 01-29-21 to 04-29-21      Session #:  9      Contraindications:  none  Precautions:  none  Red Flags:  history of cancer, right breast IB    Metastatic cancer:   no      ASSESSMENT/Progress toward goals:  Pt is progressing toward meeting goals, Patient's skin integrity is improving.  Skin is softening with treatment which allows for improved lymph uptake, and decreased risk of infection, Pt is demonstrating proficiency at self care of edema/lymphedema and Pt is demonstrating continued limb/truncal swelling reduction and improve tissue integrity Was on vacation at the beach, limb volumes are increased bilaterally today.  Would recommend recheck in 1-3 months to check stability of wearing garments.      Recommendations for treatment/garments:  1) Patient will obtain proper garments to address swelling and improve tissue integrity. Garment recommendations : Mediven Harmony CCL 2 sleeve and glove, Jovi pak night time compression  2) Pt will be seen clinically for rehabilitation to include treatments to address as needed: edema/lymphedema, pain, ROM, strength, balance and endurance deficits, as well as learning self care strategies to address these deficits  3) Pt will require a pneumatic compression pump because despite conservative treatment ZO:[id number] garments or devices, exercise, elevation, skin care and self MLD,the patient exhibits the following symptoms:hyperplasia, hyperpigmentation, fibrosis, truncal/abdominal swelling, chest/axillary swelling and unable to control swelling    Patient requires skilled Physical Therapy services  for the following problem list and secondary functional limitations:    Problem List:   Uncontrolled swelling and/or lack of appropriate compression garment    Secondary Functional Limitations:  Uncontrolled swelling can increase chances of  infection, and limit functional ROM     Patient Goals: Decrease swelling and use as many devices as needed for self care so that I get this under control    Physical Therapy Goals:       In 6-12 months: Pt and/or caregiver will:  Retain an optimal limb volume that allows pt to fit into appropriate compression garments and clothing to improve functional use of UE and QOL      PLAN:   Pt to continue with current plan of care to educate in self care, precautions, and management of symptoms, optimal edema reduction/maintenance of girth, soft tissue changes, fitting of appropriate compression garments, and progression of exercises to optimize functional ROM, strength, balance and endurance in all ADL's(home, work, recreational, community)    Planned frequency and duration  of treatment:   See  1 x in the next 3 months     Next Visit Plan:  Take circ , prep for discharge if no significant change  MLD, MFR, lymphatouch,      SUBJECTIVE:  Patient???s communication preference: verbal, written, visual, prn   History of Present Illness/ Pt reports:  Vacation at  beach, didn't wear sleeve when swimming etc.     Location of pain: No pain        OBJECTIVE:      Posture/Observations:  Forward head, rounded shoulders     Range of Motion/Flexibilty:   UE WFL   Axillary Web Syndrome location: no     Strength/MMT:   UE WFL        Girth Measurements: taken in cm    Date: 8-8 8-8 8-30 8-30 9-26 9-26        Right Left RIght Left Right Left RIght Left RIght Left   MCP 16.2 16 17 16 18  17.9       Mid palm 17 16.6 17.5 17.6 20 18.5       Thumb DIP             1st finger DIP             1st finger PIP             2nd finger DIP             2nd finger PIP             3rd finger DIP             3rd finger PIP             4th finger DIP             4th finger PIP                          Axilla             Mid breast nipple line             Ribs below breast             Natural Waistline             Umbilicus             Hips at  cm below umbilicus             Limb Volume                        Location of swelling:  right hand , wrist, forearm, elbow, arm, shoulder, axilla and chest    Skin condition:  Stemmer???s sign:no.  increased skin thickness and fibrosis, softening with treatment  Incision/Scar:   incision healing well postoperatively    Sensation :  Peripheral neuropathy:no  Light touch:     Decreased sensation under axilla, around scar, and posterior lateral upper arm         Total Treatment Time: 53 min  Treatment Rendered:    Patient and/or caregiver and therapist were mask compliant per current [place] COVID mask policy during the entire treatment session.      Manual x 53 min   Axillary Web syndrome release , Scar massage, Lymphatouch to affected areas with simultaneous MFR and MLD to appropriate anastomoses in order to decrease swelling.  Application of compression garment/devices prn to prevent re-accumulation of fluid.           Equipment provided/recommended:   N/A   Communication/consultation with other professionals:  N/A    Referrals made to the following providers:  none    Medical Necessity: This treatment is medically necessary throughout the course of this patient's life during and after cancer treatments to improve functional  activities and /or to minimize the decline of functional abilities and worsening of symptoms,including infection and recurrent hospitalizations, through independent/home management strategies.  Lymphedema, a chronic, progressive condition for which there is no cure, is marked by the accumulation of protein-rich fluid in one or more quadrants of the body due to primary or secondary disruption of the lymphatic system.  The sustained accumulation results in tissue inflammation, an increase in fatty tissue, and development of obstructive connective tissue.  These changes may result in an increased risk of infection, disfigurement and a decrease in mobility and functional performance.   Pt will benefit from physical therapy by a certified lymphedema therapist to address : education in self care, precautions, and management of symptoms, optimal edema reduction/maintenance of girth, optimal soft tissue changes, fitting of appropriate compression garments, and progression of exercises to optimize functional ROM and strength, balance, and endurance in all ADL's(home, work, recreational, community).      I attest that I have reviewed the above information.  SignedClementeen Graham, PT   02/25/2021 6:05 PM

## 2021-02-26 NOTE — Unmapped (Signed)
Radiation Oncology Follow Up Visit Note    Patient Name: Tami Gibson  Patient Age: 30 y.o.  Encounter Date: 02/28/2021      Diagnoses:  1. Malignant neoplasm of right breast in female, estrogen receptor positive, unspecified site of breast (CMS-HCC)        Assessment:  30 year old woman with cT1N1 IDC of the right breast (ER/PR pos, HER2 neg) s/p neoadjuvant ddACT followed by right simple mastectomy (immediate recon) with TAD-- > ALND with residual 1.4cm of disesae, RCB-III, excised with negative margins, and 3/19 total nodes, largest 9mm with ECE present (ypT1cN1a). She is now s/p adjuvant PMRT to the right chest wall and nodes 50Gy/75fx completed on 08/06/2020.    Interval Since Completion of Treatment:  6 months    Disease Status: No evidence of disease: 02/28/21    Toxicity/Adverse Effects: Lymphedema:  Grade 2    Description:  Well controlled with compression sleeve, PT, and pump      Care Plan: Continue with current therapy: lupron, letrozole, abemaciclib    Plan:  Scheduled to follow-up with Dr. Laney Pastor (medical oncology): 03/13/2021  Scheduled to follow-up with Gaylyn Cheers (surgical oncology): 05/20/2021   Will likely undergo TE exchange in December although this is pending plan for lymphedema surgery  We will schedule follow-up with Dr. Baird Lyons (radiation oncology) in 9 months      Interval History:  Tami Gibson returns today for a regularly scheduled follow-up. She was last seen in clinic 08/06/2020.     Since her last visit, she has been doing fairly well. Following her surgery and radiation, she has had continual grade 2 lymphedema in her right arm that is well controlled with compression sleeves and PT. She is considering surgical revision with our plastic team. She has not noted any new changes in her breasts and is looking forward to exchange for permanent implants. She has not noted any new masses. She also denies any shortness of breath, chest pain, new cardiac events, or other changes in her health.      Past Medical, Surgical, Family and Social Histories reviewed and updated in the electronic medical record.  Oncology History Overview Note   30 year old female with recently self palpated right breast lump. A targeted right breast ultrasound on 11/15/2019 showed an irregular mass with spiculated margins at the 7:00 position, 6 cm from the nipple, measuring 1.4 x 1.2 x 1.0 cm. There were 2 abnormal level 1 axillary lymph nodes measuring 1.4 x 1.3 cm and 1.2 x 0.9 cm. Additionally, there was a high level 1/level 2 axillary lymph node, measuring 2.1 x 1.3 cm with an effaced fatty hilum.      Malignant neoplasm of right breast in female, estrogen receptor positive (CMS-HCC)   11/09/2019 Initial Diagnosis    Malignant neoplasm of right breast in female, estrogen receptor positive (CMS-HCC)     11/15/2019 Biopsy    Invasive ductal carcinoma  - Nottingham combined histologic grade: 1  ER 91-100%, PR 91-100%, HER2 negative by IHC    Lymph node, right axilla, core biopsy  - Lymph node positive for metastatic carcinoma,      12/02/2019 -  Cancer Staged    Staging form: Breast, AJCC 8th Edition  - Clinical: Stage IB (cT1c, cN1, cM0, G1, ER+, PR+, HER2-) - Signed by [name], MD on 12/02/2019       12/09/2019 -  Chemotherapy    OP BREAST AC (Dose Dense) q2W X 4 CYCLES, THEN PACLITAXEL (DOSE  DENSE) Q2W x 4 CYCLES  DOXOrubicin 60 mg/m2 IV on day 1, cyclophosphamide 600 mg/m2 IV on day 1, every 2 weeks for 4 cycles, then PACLitaxel 175 mg/m2 every 2 weeks for 4 cycles      04/20/2020 Surgery    Breast, right, simple mastectomy  -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 Extracapsular extension is identified      Breast, left, simple mastectomy  -Benign breast tissue with no atypia, in situ or invasive carcinoma identified     04/20/2020 -  Cancer Staged Staging form: Breast, AJCC 8th Edition  - Pathologic stage from 04/20/2020: No Stage Recommended (ypT1c, pN1a, cM0, G2, ER+, PR+, HER2-) - Signed by [name], MD on 04/25/2020       05/07/2020 Surgery    Lymph nodes, right axillary, dissection  -One of sixteen lymph nodes involved by metastatic adenocarcinoma (1/16)  -Metastatic focus measures 3 mm in greatest dimension  -No extracapsular extension is identified     06/28/2020 - 08/06/2020 Radiation    The total radiation dose will be 5000 cGy at 200 cGy/fraction for a total of 25 fractions, treated once a day     08/13/2020 Endocrine/Hormone Therapy    Letrozole 2.5mg  +Ovarian suppression (monthly lupron)     Malignant neoplasm of right breast in female, estrogen receptor positive (CMS-HCC)   05/07/2020 Surgery    Lymph nodes, right axillary, dissection  -One of sixteen lymph nodes involved by metastatic adenocarcinoma (1/16)  -Metastatic focus measures 3 mm in greatest dimension  -No extracapsular extension is identified     06/13/2020 -  Radiation    Radiation Therapy Treatment Details (Noted on 06/13/2020)  Site: Right Breast - Overlapping sites  Technique: 3D CRT  Goal: No goal specified  Planned Treatment Start Date: No planned start date specified     06/21/2020 -  Radiation    Radiation Therapy Treatment Details (Noted on 06/21/2020)  Site: Right Breast - Overlapping sites  Technique: 3D CRT  Goal: No goal specified  Planned Treatment Start Date: No planned start date specified           Physical Exam:  Vitals:    02/28/21 0915   Temp: 35.8 ??C (96.4 ??F)     Karnofsky Performance Status: 90,  Able to carry on normal activity; minor signs or symptoms of disease (ECOG equivalent 0)  General:  No acute distress, alert and oriented X 4   Neuro:  Normal Gait and Cognition. CN II-XII focally intact  HEENT: Moist mucous membranes  Neck: Supple, midline  Cardio: Normal rate.  Lungs: Normal work of breathing  MSK: No joint pains or effusions  Psych: Normal mood and affect.  Converses clearly and emotionally appropriate.   Breast:  Examination in the sitting and recumbent positions finds good symmetry s/p bilateral mastectomy with TE in place. The right chest TE is smooth, round, soft and non tender without nodularity.  There are no nipple abnormalities, skin changes or masses bilaterally.    Labs:    No results found for: CA125, CEA, AFPTM, CA199, HCGTM, HE4, PSADIAG    No results found for: WBC, HGB, HCT, PLT, LDH, CREATININE, AST, ALT, MG      The above case was reviewed, patient was seen and examined, and the plan of care was discussed with Dr. Baird Lyons    Electronically Signed:  Sunday Shams, MD  Radiation Oncology Pgy-3  [facility]    -I saw and evaluated/examined the patient, participating in the key portions of the service.  I discussed the findings, assessment, and plan of care with the resident. I personally reviewed all relevant data associated with this encounter including imaging, labwork, and prior records. I have edited and agree with the findings and plan as documented in the resident's note.  Dana L. Baird Lyons, MD  Assistant Professor  Department of Radiation Oncology

## 2021-02-28 ENCOUNTER — Ambulatory Visit: Admit: 2021-02-28 | Payer: PRIVATE HEALTH INSURANCE | Attending: Radiation Oncology | Primary: Radiation Oncology

## 2021-02-28 ENCOUNTER — Ambulatory Visit
Admit: 2021-02-28 | Discharge: 2021-03-01 | Payer: PRIVATE HEALTH INSURANCE | Attending: Radiation Oncology | Primary: Radiation Oncology

## 2021-02-28 NOTE — Unmapped (Signed)
SteLast TxDose  Rx:Rt Sclav: 08/06/2020: 5,000/5,000 cGy  Rx:Rt Chestwa: 08/06/2020: 5,000/5,000 cGy    02/28/2021      Subjective/Assessment/Recommendations: pt is here for follow up    1. Fatigue: No issues  2. Pain: Denies pain  3. Skin: Intact  4. Lymphedema: Present, compression sleeve  5. Prescription Needs: None  6. Psychosocial: Has home support  7. Surveillance: N/A  8: Hormones: Yes

## 2021-03-13 ENCOUNTER — Other Ambulatory Visit: Admit: 2021-03-13 | Discharge: 2021-03-14 | Payer: PRIVATE HEALTH INSURANCE

## 2021-03-13 ENCOUNTER — Ambulatory Visit: Admit: 2021-03-13 | Discharge: 2021-03-14 | Payer: PRIVATE HEALTH INSURANCE

## 2021-03-13 ENCOUNTER — Telehealth: Admit: 2021-03-13 | Discharge: 2021-03-14 | Payer: PRIVATE HEALTH INSURANCE | Attending: Clinical | Primary: Clinical

## 2021-03-13 DIAGNOSIS — C50911 Malignant neoplasm of unspecified site of right female breast: Principal | ICD-10-CM

## 2021-03-13 DIAGNOSIS — Z170 Estrogen receptor positive status [ER+]: Principal | ICD-10-CM

## 2021-03-13 LAB — COMPREHENSIVE METABOLIC PANEL
ALBUMIN: 4.1 g/dL (ref 3.4–5.0)
ALKALINE PHOSPHATASE: 110 U/L (ref 46–116)
ALT (SGPT): 20 U/L (ref 10–49)
ANION GAP: 9 mmol/L (ref 5–14)
AST (SGOT): 26 U/L (ref <=34)
BILIRUBIN TOTAL: 0.5 mg/dL (ref 0.3–1.2)
BLOOD UREA NITROGEN: 17 mg/dL (ref 9–23)
BUN / CREAT RATIO: 15
CALCIUM: 9.8 mg/dL (ref 8.7–10.4)
CHLORIDE: 104 mmol/L (ref 98–107)
CO2: 25 mmol/L (ref 20.0–31.0)
CREATININE: 1.1 mg/dL — ABNORMAL HIGH
EGFR CKD-EPI (2021) FEMALE: 69 mL/min/{1.73_m2} (ref >=60)
GLUCOSE RANDOM: 81 mg/dL (ref 70–179)
POTASSIUM: 3.8 mmol/L (ref 3.4–4.8)
PROTEIN TOTAL: 7.4 g/dL (ref 5.7–8.2)
SODIUM: 138 mmol/L (ref 135–145)

## 2021-03-13 LAB — CBC W/ AUTO DIFF
BASOPHILS ABSOLUTE COUNT: 0 10*9/L (ref 0.0–0.1)
BASOPHILS RELATIVE PERCENT: 0.8 %
EOSINOPHILS ABSOLUTE COUNT: 0.1 10*9/L (ref 0.0–0.5)
EOSINOPHILS RELATIVE PERCENT: 1.5 %
HEMATOCRIT: 34.6 % (ref 34.0–44.0)
HEMOGLOBIN: 11.8 g/dL (ref 11.3–14.9)
LYMPHOCYTES ABSOLUTE COUNT: 1 10*9/L — ABNORMAL LOW (ref 1.1–3.6)
LYMPHOCYTES RELATIVE PERCENT: 28.7 %
MEAN CORPUSCULAR HEMOGLOBIN CONC: 34 g/dL (ref 32.0–36.0)
MEAN CORPUSCULAR HEMOGLOBIN: 27.1 pg (ref 25.9–32.4)
MEAN CORPUSCULAR VOLUME: 79.7 fL (ref 77.6–95.7)
MEAN PLATELET VOLUME: 7.7 fL (ref 6.8–10.7)
MONOCYTES ABSOLUTE COUNT: 0.3 10*9/L (ref 0.3–0.8)
MONOCYTES RELATIVE PERCENT: 8.7 %
NEUTROPHILS ABSOLUTE COUNT: 2.1 10*9/L (ref 1.8–7.8)
NEUTROPHILS RELATIVE PERCENT: 60.3 %
PLATELET COUNT: 192 10*9/L (ref 150–450)
RED BLOOD CELL COUNT: 4.35 10*12/L (ref 3.95–5.13)
RED CELL DISTRIBUTION WIDTH: 14.8 % (ref 12.2–15.2)
WBC ADJUSTED: 3.6 10*9/L (ref 3.6–11.2)

## 2021-03-13 NOTE — Unmapped (Signed)
It was a pleasure to see you today in the Breast Medical Oncology Clinic.      For clinical concerns during working hours, please call [phone]. My nurse navigator is Nani Gasser, RN    For clinical trial questions please call the study coordinator.     For emergencies, evenings or weekends, please call [phone] and ask for the oncology fellow on call.  Reasons to call the emergency line may include:  - Fever of 100.5 or greater  - Nausea and/or vomiting not relieved with nausea medicine  - Diarrhea or constipation not relieved with bowel regimen  - Severe pain not relieved with usual pain regimen

## 2021-03-13 NOTE — Unmapped (Signed)
Peripheral stick done by Burns Spain, 23g L H, labs drawn and sent.

## 2021-03-13 NOTE — Unmapped (Signed)
CC: Post op     HPI:  Tami Gibson is a 30 y.o. 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 Teola Bradley) and immediate reconstruction with tissue expander placement on 04/20/20 who presents today for post op check. Patients final pathology revealed residual disease with 2/3 positive lymph nodes and she underwent ALND with Dr. Teola Bradley on 05/07/20. She is on letrozole. She underwent adjuvant radiation (completed 08/06/20). She has been fully expanded 600/600 as of 09/11/20. She is scheduled for bilateral DIEP flaps in 07/2021.     At her last visit, we discussed the lymphedema changes of her RUE. She is interested in learning the results of of her lymphedema studies. She has appointment with Dr Donata Duff scheduled for December. She states her decongestive therapy with compression and PT have been working. She also is interested in discussing the post op course for her DIEP flap. She also was worried about a change in her right TE that she feels with pressure, like when sleeping on it.     Past Medical History:  Past Medical History:   Diagnosis Date   ??? Acne     started during teen yrs., Accutane 2008-2009 during college yrs   ??? Anemia     During teen years only, no anemia recently   ??? 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 Gestational hypertension 01/30/2017    end of pregnancy had hypertension, was induced   ??? Headache     migraine occasionally   ??? Hypertension     during week 37 week, pt induced and had C/S due to hypertension   ??? 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 Kidney stone    ??? Malignant neoplasm of right breast in female, 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/30/2019   ??? Pyelonephritis 09/2016    Kidney stones   ??? Urinary tract infection     Had 5-6 in Lifetime, esp high school and college age   ??? Varicella     during childhood   ??? Vitamin B 12 deficiency Past Surgical History:  Past Surgical History:   Procedure Laterality Date   ??? BREAST BIOPSY Right     IDC   ??? CESAREAN SECTION  01/30/2017    breech   ??? CHEMOTHERAPY     ??? IR INSERT PORT AGE GREATER THAN 5 YRS  12/08/2019    IR INSERT PORT AGE GREATER THAN 5 YRS 12/08/2019 Jobe Gibbon, MD IMG VIR [facility]   ??? LUMBAR PUNCTURE DIAGNOSTIC [facility] HISTORICAL RESULT)  01/2015    to relieve ICP   ??? PR BX/REMV,LYMPH NODE,DEEP AXILL Right 04/20/2020    Procedure: BX/EXC LYMPH NODE; OPEN, DEEP AXILRY NODE;  Surgeon: Moss Mc, MD;  Location: MAIN OR [facility];  Service: Surgical Oncology Breast   ??? PR CESAREAN DELIVERY ONLY N/A 01/30/2017    Procedure: CESAREAN DELIVERY ONLY;  Surgeon: Asher Muir, MD;  Location: L&D C-SECTION OR SUITES [facility];  Service: Maternal-Fetal Medicine   ??? PR CESAREAN DELIVERY ONLY N/A 02/17/2019    Procedure: CESAREAN DELIVERY ONLY;  Surgeon: Lonny Prude, MD;  Location: L&D C-SECTION OR SUITES [facility];  Service: Maternal-Fetal Medicine   ??? PR IMPLNT BIO IMPLNT FOR SOFT TISSUE REINFORCEMENT Bilateral 04/20/2020    Procedure: IMPLANTATION BIOLOGIC IMPLANT(EG, ACELLULAR DERMAL MATRIX) FOR SOFT TISSUE REINFORCEMENT(EG, BREAST, TRUNK);  Surgeon: Jacki Cones  Lafayette Dragon, MD;  Location: MAIN OR [facility];  Service: Plastics   ??? PR INTRAOPERATIVE SENTINEL LYMPH NODE ID W DYE INJECTION Right 04/20/2020    Procedure: INTRAOPERATIVE IDENTIFICATION SENTINEL LYMPH NODE(S) INCLUDE INJECTION NON-RADIOACTIVE DYE, WHEN PERFORMED;  Surgeon: Moss Mc, MD;  Location: MAIN OR [place];  Service: Surgical Oncology Breast   ??? PR INTRAOPERATIVE SENTINEL LYMPH NODE ID W DYE INJECTION Right 05/07/2020    Procedure: INTRAOPERATIVE IDENTIFICATION SENTINEL LYMPH NODE(S) INCLUDE INJECTION NON-RADIOACTIVE DYE, WHEN PERFORMED;  Surgeon: Moss Mc, MD;  Location: MAIN OR [place];  Service: Surgical Oncology Breast   ??? PR MASTECTOMY, SIMPLE, COMPLETE Bilateral 04/20/2020 Procedure: MASTECTOMY, SIMPLE, COMPLETE;  Surgeon: Moss Mc, MD;  Location: MAIN OR [place];  Service: Surgical Oncology Breast   ??? PR REMOVE ARMPITS LYMPH NODES COMPLT Right 05/07/2020    Procedure: AXILLARY LYMPHADENECTOMY; COMPLETE;  Surgeon: Moss Mc, MD;  Location: MAIN OR [place];  Service: Surgical Oncology Breast   ??? PR TISSUE EXPANDER PLACEMENT BREAST RECONSTRUCTION Bilateral 04/20/2020    Procedure: TISSUE EXPANDER PLACEMENT IN BREAST RECONSTRUCTION, INCLUDING SUBSEQUENT EXPANSION(S);  Surgeon: Arsenio Katz, MD;  Location: MAIN OR [facility];  Service: Plastics   ??? TONSILECTOMY, ADENOIDECTOMY, BILATERAL MYRINGOTOMY AND TUBES     ??? WISDOM TOOTH EXTRACTION  2004       Medications:  Current Outpatient Medications   Medication Sig Dispense Refill   ??? abemaciclib (VERZENIO) 100 mg Tab tablet Take 1 tablet (100 mg total) by mouth Two (2) times a day. Take with or without food. 56 tablet 5   ??? acetaminophen (TYLENOL) 325 MG tablet Take 650 mg by mouth every six (6) hours as needed for pain.     ??? calcium carbonate (TUMS) 200 mg calcium (500 mg) chewable tablet Chew 1 tablet daily.     ??? docusate sodium (COLACE) 100 MG capsule Take 100 mg by mouth nightly as needed (PRN).     ??? ergocalciferol-1,250 mcg, 50,000 unit, (DRISDOL) 1,250 mcg (50,000 unit) capsule Take 1,250 mcg by mouth once a week.     ??? escitalopram oxalate (LEXAPRO) 10 MG tablet Take 1 tablet (10 mg total) by mouth in the morning. 30 tablet 5   ??? fexofenadine (ALLEGRA) 180 MG tablet Take 180 mg by mouth.     ??? letrozole (FEMARA) 2.5 mg tablet Take 1 tablet (2.5 mg total) by mouth daily. 90 tablet 3   ??? omeprazole (PRILOSEC) 20 MG capsule Take 1 capsule (20 mg total) by mouth daily. 90 capsule 3   ??? ondansetron (ZOFRAN-ODT) 4 MG disintegrating tablet Take 1 tablet (4 mg total) by mouth every eight (8) hours as needed for nausea. 30 tablet 3   ??? rimegepant (NURTEC ODT) 75 mg TbDL Take 75 mg by mouth.  (Patient not taking: Reported on 03/13/2021)     ??? SUMAtriptan (IMITREX) 100 MG tablet TAKE ONE TABLET BY MOUTH AT ONSET OF MIGRAINE FOR UP TO 1 DOSE . IF SYMPTOMS PERSIST A SECOND DOSE MAY BE TAKEN IN 2 HOURS IF NEEDED     ??? topiramate (TOPAMAX) 25 MG tablet TAKE 3 TABLETS BY MOUTH ONCE DAILY     ??? topiramate (TOPAMAX) 50 MG tablet Take 50 mg by mouth Two (2) times a day.     ??? vitamin E-134 mg, 200 UNIT, 134 mg (200 UNIT) capsule Take 134 mg by mouth daily.       No current facility-administered medications for this visit.     Facility-Administered Medications Ordered in Other Visits  Medication Dose Route Frequency Provider Last Rate Last Admin   ??? leuprolide (LUPRON) 3.75 mg injection                Allergies:  No Known Allergies    Family History:   Family History   Problem Relation Age of Onset   ??? Cancer Maternal Aunt 45        breast Ca   ??? Hypertension Father    ??? Other Father 50        Benign brain tumor   ??? Hypothyroidism Mother    ??? Cancer Paternal Aunt         Breast Ca dx in 63s   ??? ALS Maternal Aunt    ??? Ovarian cancer Maternal Grandmother 52   ??? No Known Problems Maternal Grandfather    ??? Cancer Paternal Grandmother         Unknown Cancer   ??? No Known Problems Paternal Grandfather    ??? No Known Problems Maternal Aunt    ??? No Known Problems Maternal Aunt    ??? Breast cancer Maternal Cousin         Dx in 41s   ??? No Known Problems Paternal Aunt    ??? No Known Problems Daughter    ??? No Known Problems Daughter    ??? No Known Problems Other    ??? BRCA 1/2 Neg Hx    ??? Colon cancer Neg Hx    ??? Endometrial cancer Neg Hx     The patient reports no family history for bleeding disorders or anesthetic problems.    Social History:  Social History     Socioeconomic History   ??? Marital status: Married     Spouse name: Dylan   ??? Number of children: 0   Occupational History   ??? Occupation: Recreational therapist    Tobacco Use   ??? Smoking status: Never Smoker   ??? Smokeless tobacco: Never Used   Vaping Use   ??? Vaping Use: Never used Substance and Sexual Activity   ??? Alcohol use: Not Currently   ??? Drug use: No   ??? Sexual activity: Yes     Partners: Male     Birth control/protection: Pill   Other Topics Concern   ??? Exercise Yes   ??? Living Situation No   Social History Narrative    PRECONCEPTUAL ASSESSMENT:        Laguana is a 30 y.o. Caucasian female    Caffeine:denies use    Cats: no    Exercise: not active    Diet: balanced    Family hx of of birth defects, chromosomal abnormality, intellectual disability, developmental delay: no.            PARTNER HISTORY:     Domingo Dimes is a 22 y.o.  Caucasian female.    Occupation:  Immunologist; [employer]    He has fathered children:  no    He is healthy with no chronic illness    Family history of  birth defects, chromosomal abnormality, intellectual disability, or developmental delay: no.              Social Determinants of Health     Financial Resource Strain: Low Risk    ??? Difficulty of Paying Living Expenses: Not very hard   Food Insecurity: No Food Insecurity   ??? Worried About Running Out of Food in the Last Year: Never true   ??? Ran Out of Food in the Last Year:  Never true   Transportation Needs: No Transportation Needs   ??? Lack of Transportation (Medical): No   ??? Lack of Transportation (Non-Medical): No       ROS:   Otherwise, 12 point review of systems was completed and is negative except as per HPI.      Vitals:   Vitals:    03/14/21 0815   BP: 108/79   Pulse: 88   Resp: 18   Temp: 36.5 ??C (97.7 ??F)   SpO2: 98%        Gen: AA in NAD  HEENT: NCAT, EOMI, PERRL, Non icteric sclerae, MMM  CV: RRR  RESP: quiet respirations on room air  EXT: warm and well perfused, compression garment to RUE  BREASTS: bilateral breasts surgically absent.  NAC surgically absent bilaterally.  Horizontally oriented incisions well healed without keloid/hypertrophic scarring. Expander in appropriate position.  Mild blunting of inferior poles of bilateral breasts.    Impression and Plan:  Leisa Gault is here today for post op check after B SSM with right TAD (Dr. Teola Bradley) and immediate reconstruction with tissue expander placement on 04/20/20. She has completed adjuvant radiation and is fully expanded at 600/600. She has bilateral DIEP flap for breast reconstruction scheduled for 07/2021.     - Reassured her that there is no abnormality of the right TE and the difference she is feeling is likely from radiation changes   - Reviewed DIEP flap procedure and anticipated post operative course   - I will touch base with Dr Marita Kansas regarding if she is a reasonable candidate for LNT and that this could potentially be performed at the time of the DIEP flap, and that we could discuss possibly him performing both procedures for her should that be what she desires  - Follow up with Dr. Marita Kansas as scheduled for December 2022      No follow-ups on file.     ______________________________________________________________________    Documentation assistance was provided by Marga Melnick, Scribe, on March 14, 2021 at 8:42 AM for Victorino Dike C. [place] Dragon, MD    The documentation recorded by the scribe accurately reflects the service I personally performed and the decisions made by me Janet Berlin.

## 2021-03-13 NOTE — Unmapped (Signed)
APPOINTMENTS / QUESTIONS  For questions regarding appointments or your surgical care, Monday through Friday, 8 am to 4:30 pm    [facility]  [address],   [place] [address]  T: [phone]  F: [phone]    Ambulatory Surgery Center  1st Floor, Ambulatory Care Center  [address]  [place], [place] [address]  T: [phone]  Website:  [facility]    [facility]  [address] [place], [place] [address]  Ground Floor [facility]  T: [phone]  F: [phone]    [facility]  [address], [place], [place] [address]  T: [phone]  F: [phone]    [facility]  [address], [place], [place] [address]   1st Floor [facility]  T: [phone]  F: [phone]    [facility]  [address] [place], [place] [address]  Third Floor, [address]  T: [phone]   F: [phone]    Administrative Office  [address]  [place], [place] [address]  T:  [phone]  F:  [phone]      Physicians:  Epimenio Sarin MD:   Nurse:  Vista Mink BSN, RN [phone]  [email or web address]   Surgery Scheduler: Lysle Morales  [phone] [email or web address]    Tarry Kos MD:   Nurse:  [name] BSN, RN, CPSN [phone] [email or web address]  Surgery Scheduler:   Lysle Morales  [phone] [email or web address]    Adeyemi Clydie Braun MD:  Nurse: Mila Palmer BSN, RN, [facility]  [phone] [email or web address]  Surgery Scheduler: Kristine Royal [phone] [email or web address]    Darleene Cleaver MD:  Nurse: Venetia Maxon BSN, RN, CPSN  [phone]  [email or web address]  Surgery Scheduler: Kristine Royal [phone] [email or web address]    Advance Practice Providers:  Ezekiel Slocumb, DNP, NP-C:   Nurse: Britt Boozer, MSN. RN [phone]  [email or web address]  Surgery Scheduler: Kristine Royal [phone] [email or web address]    Rush Landmark, PA-C:   Nurse: Venetia Maxon BSN, RN, CPSN  [phone] [email or web address]  Surgery Scheduler: Kristine Royal [phone] [email or web address]    Financial Counselor:   Deloria Lair  T: [phone]  F: [phone]  E-mail:  [email or web address]     Surgery Scheduling:  You will receive a phone call from the surgery schedulers regarding date for surgery in 1-2 weeks from  your appointment.    Please call one of the above numbers if you have not received a phone call 2 weeks after your appointment with the provider.      [facility] Contact Information: Please call to schedule  *Radiology (CT, X-ray, Ultrasound) [phone]  *Mammography & Breast Ultrasound [phone]  *MRI [phone]  *Interventional Radiology [phone]    AFTER HOURS/HOLIDAYS:  For emergencies after-hours (after 5pm, or weekends): call the [facility] operator [phone]) and ask to page the Plastic Surgery resident on call. You will be directed to a surgery resident who likely is not immediately aware of the details of your case, but can help you deal with any emergencies that cannot wait until regular business hours.  Please be aware that this person is responding to many in-hospital emergencies and patient issues and may not answer your phone call immediately, but will return your call as soon as possible.    Billing Questions/Financial Navigation:  [phone]    Precare Location:  [facility] Pre-Procedure Services at [facility])   [address]  [place], [place] [address] [facility] -  old [address] Aide Building near [employer])    Blood Work Location:  [address], [place] [address]. There are no suite numbers, but it is located on the first floor there. Go to the main desk and tell them you are there for walk-in labs.  Precare:   The day prior to your scheduled surgery, Pre-Care will call you with instructions.  If you have not heard from them by 4PM, and would like to check on the status of your surgery, please call:  [facility]: [phone]  [facility]: [phone]  [facility]: [phone]  [place]:  [phone]    Minor Procedure Room:  Please do not wear any jewelry to your procedure.    Weather Hotline:  [phone]    [facility] forms and paperwork:  Please allow  a 2 week turn around for forms to be completed and faxed.

## 2021-03-13 NOTE — Unmapped (Unsigned)
Breast Oncology Return Patient Evaluation  Referring Physician: [name], Md  [address] Hem/onc  [place],  [place] [address].  PCP: Durenda Hurt, MD  Breast Med/Onc: Adonis Brook, MD    Cancer Team  Surgical Oncology: Jobe Marker, MD  Radiation Oncology: Rayetta Humphrey, MD  Medical Oncology: Adonis Brook, MD  Plastic Surgery: Levan Hurst, MD  Genetics: Audree Camel, MD    Reason for Visit: A 30 y.o. female with breast cancer referred for consultation for recommendations concerning the management of breast cancer.    Assessment/Plan:   Cancer Staging  Malignant neoplasm of right breast in female, estrogen receptor positive (CMS-HCC)  Staging form: Breast, AJCC 8th Edition  - Clinical: Stage IB (cT1c, cN1, cM0, G1, ER+, PR+, HER2-) - Signed by [name], MD on 12/02/2019  - Pathologic stage from 04/20/2020: No Stage Recommended (ypT1c, pN1a, cM0, G2, ER+, PR+, HER2-) - Signed by [name], MD on 04/25/2020  ??  #Stage IB (cT1c, cN1, cM0, G1, ER+, PR+, HER2-)right breast cancer    Neoadjuvant chemotherapy  Completed ddAC followed by DDTaxol on 03/21/20    Locoregional therapy  -s/p Right mastectomy with SLN and prophylactic left mastectomy  -Pathology consistent with residual disease, MD Dareen Piano residual cancer burden class: RCB-III  With 2/3 positive LNs   - ALND completed on 05/07/2020-1/16 lymph nodes involved. Total 3 LNs involved by metastatic adenocarcinoma  -Completed adjuvant RT on 08/06/2020    Adjuvant therapy  Ovarian suppression (lupron) + Letrozole - SOT 08/09/2020  Adjuvant Abemaciclib - SOT 08/29/2020. Dose reduced to 100mg  BID in June 2022 to improve tolerance      Staging scans  -Patient had baseline staging scans which are negative for any metastatic disease  -Patient requesting to repeat her staging scans. She is having a really hard time moving on after her diagnosis and feels like repeating scans will help reassure her that theres is no cancer left in her body.   -Repeat scans on March 2022 showed no evidence of metastasis    #Hot flashes  -Patient endorses hot flashes since initiation of aromatase inhibition therapy  -Discussed pharmacologic options including oxybutynin, gabapentin, or cross-tapering escitalopram (managed by PCP for anxiety) for venlafaxine, but patient opts for no pharmacologic therapy at this time    #Syncope/Presyncope  -Patient has had multiple episodes of presyncope with tunnel vision and one episode of syncope with preceding tunnel vision and lightheadedness all occurring approximately 4 to 5 days after chemo infusions.  No positional component and she is trying to stay well-hydrated.    -Extensive work-up has been unrevealing, including labs, chest CTA, brain MRI, CT head.  TTE prior to start of chemotherapy showed structurally normal heart.  She is on both Zofran and Lexapro, both which can prolong QT, but recent EKGs reassuring with normal QT interval, no preexcitation.  2 week Zio-patch w/o evidence of any concerning arrhythmia  -Overall, presentation seems most consistent with vasovagal etiology, unlikely to be cardiogenic. No symptoms since she was done with [facility].  - Cardiology following - followup limited TTE unremarkable  -Resolved    # Bone health  -Baseline bone mineral density evaluation prior to starting an aromatase inhibitor was normal  -Repeat in March 2024  ??  ??# Supportive care  - Psychosocial: Supportive mother and husband. Follows with [facility] and [facility]  -Heartburn: Daily PPI prescribed  -Hotflashes: as above  -Lymphedema: Continues to wear her compression sleeve and follows with physical therapy.  -  Diarrhea: Imodium as needed    DISPO:  --Fu in 3 months, labs prior  --Continue lupron monthly with nurse visits    -----------------------------------------------------------------------------------------------------------------------------------------------    HPI: Tami Gibson is a 30 y.o. female who is seen for routine follow-up of management of breast cancer.     Interval History:  She is tolerating abemaciclib well at this reduced dose. Although, fatigue has been something she has been experiencing since the change in weather.     --Performance status=0    Breast Cancer History  Oncology History Overview Note   31 year old female with recently self palpated right breast lump. A targeted right breast ultrasound on 11/15/2019 showed an irregular mass with spiculated margins at the 7:00 position, 6 cm from the nipple, measuring 1.4 x 1.2 x 1.0 cm. There were 2 abnormal level 1 axillary lymph nodes measuring 1.4 x 1.3 cm and 1.2 x 0.9 cm. Additionally, there was a high level 1/level 2 axillary lymph node, measuring 2.1 x 1.3 cm with an effaced fatty hilum.      Malignant neoplasm of right breast in female, estrogen receptor positive (CMS-HCC)   11/09/2019 Initial Diagnosis    Malignant neoplasm of right breast in female, estrogen receptor positive (CMS-HCC)     11/15/2019 Biopsy    Invasive ductal carcinoma  - Nottingham combined histologic grade: 1  ER 91-100%, PR 91-100%, HER2 negative by IHC    Lymph node, right axilla, core biopsy  - Lymph node positive for metastatic carcinoma,      12/02/2019 -  Cancer Staged    Staging form: Breast, AJCC 8th Edition  - Clinical: Stage IB (cT1c, cN1, cM0, G1, ER+, PR+, HER2-) - Signed by [name], MD on 12/02/2019       12/09/2019 -  Chemotherapy    OP BREAST AC (Dose Dense) q2W X 4 CYCLES, THEN PACLITAXEL (DOSE DENSE) Q2W x 4 CYCLES  DOXOrubicin 60 mg/m2 IV on day 1, cyclophosphamide 600 mg/m2 IV on day 1, every 2 weeks for 4 cycles, then PACLitaxel 175 mg/m2 every 2 weeks for 4 cycles      04/20/2020 Surgery    Breast, right, simple mastectomy  -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 Extracapsular extension is identified      Breast, left, simple mastectomy  -Benign breast tissue with no atypia, in situ or invasive carcinoma identified     04/20/2020 -  Cancer Staged    Staging form: Breast, AJCC 8th Edition  - Pathologic stage from 04/20/2020: No Stage Recommended (ypT1c, pN1a, cM0, G2, ER+, PR+, HER2-) - Signed by [name], MD on 04/25/2020       05/07/2020 Surgery    Lymph nodes, right axillary, dissection  -One of sixteen lymph nodes involved by metastatic adenocarcinoma (1/16)  -Metastatic focus measures 3 mm in greatest dimension  -No extracapsular extension is identified     06/28/2020 - 08/06/2020 Radiation    The total radiation dose will be 5000 cGy at 200 cGy/fraction for a total of 25 fractions, treated once a day     08/13/2020 Endocrine/Hormone Therapy    Letrozole 2.5mg  +Ovarian suppression (monthly lupron)     Malignant neoplasm of right breast in female, estrogen receptor positive (CMS-HCC)   05/07/2020 Surgery    Lymph nodes, right axillary, dissection  -One of sixteen lymph nodes involved by metastatic  adenocarcinoma (1/16)  -Metastatic focus measures 3 mm in greatest dimension  -No extracapsular extension is identified     06/13/2020 -  Radiation    Radiation Therapy Treatment Details (Noted on 06/13/2020)  Site: Right Breast - Overlapping sites  Technique: 3D CRT  Goal: No goal specified  Planned Treatment Start Date: No planned start date specified     06/21/2020 -  Radiation    Radiation Therapy Treatment Details (Noted on 06/21/2020)  Site: Right Breast - Overlapping sites  Technique: 3D CRT  Goal: No goal specified  Planned Treatment Start Date: No planned start date specified         Past Medical History  Past Medical History:   Diagnosis Date   ??? Acne     started during teen yrs., Accutane 2008-2009 during college yrs   ??? Anemia     During teen years only, no anemia recently   ??? 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 Gestational hypertension 01/30/2017    end of pregnancy had hypertension, was induced   ??? Headache     migraine occasionally   ??? Hypertension     during week 37 week, pt induced and had C/S due to hypertension   ??? 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 Kidney stone    ??? Malignant neoplasm of right breast in female, 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/30/2019   ??? Pyelonephritis 09/2016    Kidney stones   ??? Urinary tract infection     Had 5-6 in Lifetime, esp high school and college age   ??? Varicella     during childhood   ??? Vitamin B 12 deficiency        Reproductive/GYN History  OB History   Gravida Para Term Preterm AB Living   2 2 2  0 0 2   SAB IAB Ectopic Molar Multiple Live Births   0 0 0 0 0 2      # Outcome Date GA Lbr Len/2nd Weight Sex Delivery Anes PTL Lv   2 Term 02/17/19 [redacted]w[redacted]d  3545 g (7 lb 13 oz) F CS-LTranv Spinal, EPI N LIV      Complications: Failure to Progress in First Stage      Name: Tami Gibson,Tami Gibson      Apgar1: 8  Apgar5: 9   1 Term 01/30/17 [redacted]w[redacted]d  2875 g (6 lb 5.4 oz) F CS-LTranv Spinal, EPI N LIV      Complications: Hypertension, Breech birth      Name: Bascomb,OAKLEY      Apgar1: 8  Apgar5: 9      Obstetric Comments   OB-History reviewed by Lucilla Lame RN on 02/22/2019.   Gardasil series completed   Last pap:  2017; NIL   No abn paps   No hx STI's       Surgical History  Past Surgical History:   Procedure Laterality Date   ??? BREAST BIOPSY Right     IDC   ??? CESAREAN SECTION  01/30/2017    breech   ??? CHEMOTHERAPY     ??? IR INSERT PORT AGE GREATER THAN 5 YRS  12/08/2019    IR INSERT PORT AGE GREATER THAN 5 YRS 12/08/2019 Jobe Gibbon, MD IMG VIR H&V [facility]   ??? LUMBAR PUNCTURE DIAGNOSTIC [facility] HISTORICAL RESULT)  01/2015    to relieve ICP   ??? PR BX/REMV,LYMPH NODE,DEEP AXILL Right 04/20/2020  Procedure: BX/EXC LYMPH NODE; OPEN, DEEP AXILRY NODE;  Surgeon: Moss Mc, MD;  Location: MAIN OR [facility];  Service: Surgical Oncology Breast   ??? PR CESAREAN DELIVERY ONLY N/A 01/30/2017 Procedure: CESAREAN DELIVERY ONLY;  Surgeon: Asher Muir, MD;  Location: L&D C-SECTION OR SUITES [facility];  Service: Maternal-Fetal Medicine   ??? PR CESAREAN DELIVERY ONLY N/A 02/17/2019    Procedure: CESAREAN DELIVERY ONLY;  Surgeon: Lonny Prude, MD;  Location: L&D C-SECTION OR SUITES [facility];  Service: Maternal-Fetal Medicine   ??? PR IMPLNT BIO IMPLNT FOR SOFT TISSUE REINFORCEMENT Bilateral 04/20/2020    Procedure: IMPLANTATION BIOLOGIC IMPLANT(EG, ACELLULAR DERMAL MATRIX) FOR SOFT TISSUE REINFORCEMENT(EG, BREAST, TRUNK);  Surgeon: Arsenio Katz, MD;  Location: MAIN OR [facility];  Service: Plastics   ??? PR INTRAOPERATIVE SENTINEL LYMPH NODE ID W DYE INJECTION Right 04/20/2020    Procedure: INTRAOPERATIVE IDENTIFICATION SENTINEL LYMPH NODE(S) INCLUDE INJECTION NON-RADIOACTIVE DYE, WHEN PERFORMED;  Surgeon: Moss Mc, MD;  Location: MAIN OR [place];  Service: Surgical Oncology Breast   ??? PR INTRAOPERATIVE SENTINEL LYMPH NODE ID W DYE INJECTION Right 05/07/2020    Procedure: INTRAOPERATIVE IDENTIFICATION SENTINEL LYMPH NODE(S) INCLUDE INJECTION NON-RADIOACTIVE DYE, WHEN PERFORMED;  Surgeon: Moss Mc, MD;  Location: MAIN OR [place];  Service: Surgical Oncology Breast   ??? PR MASTECTOMY, SIMPLE, COMPLETE Bilateral 04/20/2020    Procedure: MASTECTOMY, SIMPLE, COMPLETE;  Surgeon: Moss Mc, MD;  Location: MAIN OR [place];  Service: Surgical Oncology Breast   ??? PR REMOVE ARMPITS LYMPH NODES COMPLT Right 05/07/2020    Procedure: AXILLARY LYMPHADENECTOMY; COMPLETE;  Surgeon: Moss Mc, MD;  Location: MAIN OR [place];  Service: Surgical Oncology Breast   ??? PR TISSUE EXPANDER PLACEMENT BREAST RECONSTRUCTION Bilateral 04/20/2020    Procedure: TISSUE EXPANDER PLACEMENT IN BREAST RECONSTRUCTION, INCLUDING SUBSEQUENT EXPANSION(S);  Surgeon: Arsenio Katz, MD;  Location: MAIN OR [facility];  Service: Plastics   ??? TONSILECTOMY, ADENOIDECTOMY, BILATERAL MYRINGOTOMY AND TUBES     ??? WISDOM TOOTH EXTRACTION  2004       Medications    Current Outpatient Medications:   ???  abemaciclib (VERZENIO) 100 mg Tab tablet, Take 1 tablet (100 mg total) by mouth Two (2) times a day. Take with or without food., Disp: 56 tablet, Rfl: 5  ???  acetaminophen (TYLENOL) 325 MG tablet, Take 650 mg by mouth every six (6) hours as needed for pain., Disp: , Rfl:   ???  calcium carbonate (TUMS) 200 mg calcium (500 mg) chewable tablet, Chew 1 tablet daily., Disp: , Rfl:   ???  docusate sodium (COLACE) 100 MG capsule, Take 100 mg by mouth nightly as needed (PRN)., Disp: , Rfl:   ???  ergocalciferol-1,250 mcg, 50,000 unit, (DRISDOL) 1,250 mcg (50,000 unit) capsule, Take 1,250 mcg by mouth once a week., Disp: , Rfl:   ???  escitalopram oxalate (LEXAPRO) 10 MG tablet, Take 1 tablet (10 mg total) by mouth in the morning., Disp: 30 tablet, Rfl: 5  ???  fexofenadine (ALLEGRA) 180 MG tablet, Take 180 mg by mouth., Disp: , Rfl:   ???  letrozole (FEMARA) 2.5 mg tablet, Take 1 tablet (2.5 mg total) by mouth daily., Disp: 90 tablet, Rfl: 3  ???  omeprazole (PRILOSEC) 20 MG capsule, Take 1 capsule (20 mg total) by mouth daily., Disp: 90 capsule, Rfl: 3  ???  ondansetron (ZOFRAN-ODT) 4 MG disintegrating tablet, Take 1 tablet (4 mg total) by mouth every eight (8) hours as needed for nausea., Disp: 30 tablet, Rfl: 3  ???  SUMAtriptan (IMITREX) 100 MG tablet, TAKE ONE TABLET BY MOUTH AT ONSET OF MIGRAINE FOR UP TO 1 DOSE . IF SYMPTOMS PERSIST A SECOND DOSE MAY BE TAKEN IN 2 HOURS IF NEEDED, Disp: , Rfl:   ???  topiramate (TOPAMAX) 25 MG tablet, TAKE 3 TABLETS BY MOUTH ONCE DAILY, Disp: , Rfl:   ???  topiramate (TOPAMAX) 50 MG tablet, Take 50 mg by mouth Two (2) times a day., Disp: , Rfl:   ???  vitamin E-134 mg, 200 UNIT, 134 mg (200 UNIT) capsule, Take 134 mg by mouth daily., Disp: , Rfl:   ???  rimegepant (NURTEC ODT) 75 mg TbDL, Take 75 mg by mouth.  (Patient not taking: Reported on 03/13/2021), Disp: , Rfl:   No current facility-administered medications for this visit.    Facility-Administered Medications Ordered in Other Visits:   ???  leuprolide (LUPRON) 3.75 mg injection, , , ,     Allergies  No Known Allergies    Personal and Social History  Social History     Social History Narrative    PRECONCEPTUAL ASSESSMENT:        Nelwyn is a 30 y.o. Caucasian female    Caffeine:denies use    Cats: no    Exercise: not active    Diet: balanced    Family hx of of birth defects, chromosomal abnormality, intellectual disability, developmental delay: no.            PARTNER HISTORY:     Domingo Dimes is a 29 y.o.  Caucasian female.    Occupation:  Immunologist; [employer]    He has fathered children:  no    He is healthy with no chronic illness    Family history of  birth defects, chromosomal abnormality, intellectual disability, or developmental delay: no.                Family History  Family History   Problem Relation Age of Onset   ??? Cancer Maternal Aunt 45        breast Ca   ??? Hypertension Father    ??? Other Father 56        Benign brain tumor   ??? Hypothyroidism Mother    ??? Cancer Paternal Aunt         Breast Ca dx in 49s   ??? ALS Maternal Aunt    ??? Ovarian cancer Maternal Grandmother 48   ??? No Known Problems Maternal Grandfather    ??? Cancer Paternal Grandmother         Unknown Cancer   ??? No Known Problems Paternal Grandfather    ??? No Known Problems Maternal Aunt    ??? No Known Problems Maternal Aunt    ??? Breast cancer Maternal Cousin         Dx in 60s   ??? No Known Problems Paternal Aunt    ??? No Known Problems Daughter    ??? No Known Problems Daughter    ??? No Known Problems Other    ??? BRCA 1/2 Neg Hx    ??? Colon cancer Neg Hx    ??? Endometrial cancer Neg Hx          Review of Systems: A 12-system review of systems was obtained including: Constitutional, Eyes, ENT, Cardiovascular, Respiratory, GI, GU, Musculoskeletal, Skin, Neurological, Psychiatric, Endocrine, Heme/Lymphatic, and Allergic/Immunologic systems. It is negative or non-contributory to the patient???s management except for the following: See Interval History. For details see the Symptom Report Form (MIM#1170), which I  have reviewed, signed and will be scanned into the record.    Physical Examination:  Vital Signs: BP 104/70  - Pulse 84  - Temp 36.1 ??C (97 ??F) (Oral)  - Resp 16  - Ht 167.6 cm (5' 6)  - Wt 96.2 kg (212 lb 1.6 oz)  - SpO2 99%  - BMI 34.23 kg/m??   General:  Healthy-appearing female in no acute distress..  Cardiovasc:  No heaves, regular, no additional sounds. No lower extremity edema.    Respiratory:  Chest clear to percussion and auscultation, unlabored  Gastrointestinal:  Soft, nontender, no hepatomegaly.   Musculoskeletal:  No bony pain or tenderness.   Skin and Subcutaneous Tissues: No significant rash.  Psychiatric: Mood is normal.  No other symptoms.   Neuro:  Alert and oriented.  Gait and coordination normal  Upper Extremity Lymphedema: Mild RUE edema  Breasts: Bilateral mastectomies with expanders in place. Well healed scars.  Heme/Lymphatic/Immunologic: No supraclavicular or cervical adenopathy    I have personally reviewed the following diagnostic studies:    Breast imaging and pathology reviewed. See Results for details have personally reviewed the following diagnostic studies:    Breast imaging and pathology reviewed. See Results for details

## 2021-03-14 ENCOUNTER — Ambulatory Visit: Admit: 2021-03-14 | Discharge: 2021-03-15 | Payer: PRIVATE HEALTH INSURANCE

## 2021-03-14 DIAGNOSIS — Z9889 Other specified postprocedural states: Principal | ICD-10-CM

## 2021-03-15 NOTE — Unmapped (Signed)
[facility]  Comprehensive Cancer Support Program   Telehealth Encounter    *non billable encounter*      Encounter Description: This encounter was conducted via telephone in the setting of State of Emergency due to COVID-19 Pandemic.     The patient reports they are currently: not at home. I spent 40 minutes on the phone with the patient on the date of service. I spent an additional 15 minutes on pre- and post-visit activities on the date of service.     The patient was physically located in [place] or a state in which I am permitted to provide care. The patient and/or parent/guardian understood that s/he may incur co-pays and cost sharing, and agreed to the telemedicine visit. The visit was reasonable and appropriate under the circumstances given the patient's presentation at the time.    The patient and/or parent/guardian has been advised of the potential risks and limitations of this mode of treatment (including, but not limited to, the absence of in-person examination) and has agreed to be treated using telemedicine. The patient's/patient's family's questions regarding telemedicine have been answered.     If the visit was completed in an ambulatory setting, the patient and/or parent/guardian has also been advised to contact their provider???s office for worsening conditions, and seek emergency medical treatment and/or call 911 if the patient deems either necessary.      Assessment:  Tami Gibson is a 30 y.o. female with a history of breast cancer. Referred by Tami Gibson, PT for support navigating survivorship and long term impact of treatment (lymphedema). She describes a normal and healthy emotional response to treatment and is interested in engaging in therapy as she transitions from active treatment to survivorship.     Risk Assessment:  A suicide and violence risk assessment was performed as part of this evaluation. There is no acute risk for suicide or violence at this time.  While future psychiatric events cannot be accurately predicted, the patient does not currently require acute inpatient psychiatric care and does not currently meet [facility] involuntary commitment criteria.       Plan:  Will continue to follow for supportive counseling.      Subjective:   Patient interviewed in a private place, accompanied by no one. Shared updates since our last visit. Had visit with med onc team this morning. Labs looked good, which surprised Tami Gibson because she has been really tired lately. Shared experience of being at the beach and visiting the same spots where they were last year right before her diagnosis (our last normal thing). Notes feeling overwhelmed, angry, and frustrated with how her family and friends responded when she explained how difficult the experience was. Describes people's tendency to try to fix, or use faith as inspiration, when Tami Gibson states she just needs someone to say Damn, that sucks. She described ways that she was able to identify what kind of support she wanted and ask for that directly from her husband. Provided validation around this process and her ability to tolerate and move through painful affect.  Provided supportive counseling, active listening, normalization, and psychoeducation. Pt engaged and open to meeting again appt scheduled for when I am back from leave.       Objective:    Mental Status Exam:  Speech/Language:    Normal rate, volume, tone, fluency   Mood:   euthymic   Thought process and Associations:   Logical, linear, clear, coherent, goal directed   Abnormal/psychotic thought content:  Denies SI, HI, self harm, delusions, obsessions, paranoid ideation, or ideas of reference   Perceptual disturbances:     Does not endorse auditory or visual hallucinations     Orientation:   Oriented to person, place, time, and general circumstances   Insight:     Intact   Judgment:    Intact   Impulse Control:   Intact     [name], LCSW  Comprehensive Cancer Support Program  Phone: [phone]  Pager: [phone]

## 2021-03-18 NOTE — Unmapped (Signed)
[facility] Refill Coordination Note    Specialty Medication(s) to be Shipped:   Hematology/Oncology: Verzenio 100mg     Other medication(s) to be shipped: No additional medications requested for fill at this time     Tami Gibson, DOB: 04-22-1991  Phone: [phone] (home)       All above HIPAA information was verified with patient.     Was a Nurse, learning disability used for this call? No    Completed refill call assessment today to schedule patient's medication shipment from the [facility] [phone]).  All relevant notes have been reviewed.     Specialty medication(s) and dose(s) confirmed: Regimen is correct and unchanged.   Changes to medications: Areeba reports no changes at this time.  Changes to insurance: No  New side effects reported not previously addressed with a pharmacist or physician: None reported  Questions for the pharmacist: No    Confirmed patient received a Conservation officer, historic buildings and a Surveyor, mining with first shipment. The patient will receive a drug information handout for each medication shipped and additional FDA Medication Guides as required.       DISEASE/MEDICATION-SPECIFIC INFORMATION        N/A    SPECIALTY MEDICATION ADHERENCE     Medication Adherence    Patient reported X missed doses in the last month: 0  Specialty Medication: Verzenio 100mg   Patient is on additional specialty medications: No  Informant: patient              Were doses missed due to medication being on hold? No    Verzenio 100 mg: 10 days of medicine on hand       REFERRAL TO PHARMACIST     Referral to the pharmacist: Not needed      [facility]     Shipping address confirmed in Epic.     Delivery Scheduled: Yes, Expected medication delivery date: 03/21/21.     Medication will be delivered via Next Day Courier to the prescription address in Epic WAM.    [name]   [facility] Specialty Technician

## 2021-03-20 ENCOUNTER — Institutional Professional Consult (permissible substitution): Admit: 2021-03-20 | Discharge: 2021-03-21 | Payer: PRIVATE HEALTH INSURANCE

## 2021-03-20 DIAGNOSIS — C50911 Malignant neoplasm of unspecified site of right female breast: Principal | ICD-10-CM

## 2021-03-20 DIAGNOSIS — Z170 Estrogen receptor positive status [ER+]: Principal | ICD-10-CM

## 2021-03-20 MED ADMIN — leuprolide (LUPRON) injection 3.75 mg: 3.75 mg | INTRAMUSCULAR | @ 14:00:00 | Stop: 2021-03-20

## 2021-03-20 MED ADMIN — leuprolide (LUPRON) 3.75 mg injection: INTRAMUSCULAR | @ 14:00:00 | Stop: 2021-03-20

## 2021-03-20 MED FILL — VERZENIO 100 MG TABLET: ORAL | 28 days supply | Qty: 56 | Fill #4

## 2021-03-20 NOTE — Unmapped (Signed)
1026 Administered Lupron and patient tolerated well. Applied band-aid .

## 2021-04-08 ENCOUNTER — Ambulatory Visit
Admit: 2021-04-08 | Discharge: 2021-04-08 | Payer: PRIVATE HEALTH INSURANCE | Attending: Rehabilitative and Restorative Service Providers" | Primary: Rehabilitative and Restorative Service Providers"

## 2021-04-09 NOTE — Unmapped (Signed)
[facility] THERAPY SERVICES [place]  OUTPATIENT PHYSICAL THERAPY  04/08/2021  Note Type: Treatment Note       Patient Name: Tami Gibson  Date of Birth:1991/01/02  Diagnosis:   Encounter Diagnoses   Name Primary?   ??? Lymphedema syndrome, postmastectomy Yes   ??? Scar condition and fibrosis of skin    ??? Malignant neoplasm of right breast in female, estrogen receptor positive, unspecified site of breast (CMS-HCC)      Referring MD:  Jobe Marker*     Date of Onset of Impairment-10/20/2020  Date PT Care Plan Established or Reviewed-10/30/2020  Date PT Treatment Started-10/30/2020   Plan of Care Effective Date: 10-30-20 to 01-28-21; 01-29-21 to 04-29-21      Session #:  10      Contraindications:  none  Precautions:  none  Red Flags:  history of cancer, right breast IB    Metastatic cancer:   no      ASSESSMENT/Progress toward goals:  Pt is progressing toward meeting goals, Patient's skin integrity is improving.  Skin is softening with treatment which allows for improved lymph uptake, and decreased risk of infection, Pt is demonstrating proficiency at self care of edema/lymphedema and Pt is demonstrating continued limb/truncal swelling reduction and improve tissue integrity Right UE limb volume increased, had busy weekend.  Is compliant with all self care, will need recheck in one month to check limb volume.  Pt notes hand swelling continues, still has not gotten glove from DME.      Recommendations for treatment/garments:  1) Patient will obtain proper garments to address swelling and improve tissue integrity. Garment recommendations : Mediven Harmony CCL 2 sleeve and glove, Jovi pak night time compression  2) Pt will be seen clinically for rehabilitation to include treatments to address as needed: edema/lymphedema, pain, ROM, strength, balance and endurance deficits, as well as learning self care strategies to address these deficits  3) Pt will require a pneumatic compression pump because despite conservative treatment [id number] garments or devices, exercise, elevation, skin care and self MLD,the patient exhibits the following symptoms:hyperplasia, hyperpigmentation, fibrosis, truncal/abdominal swelling, chest/axillary swelling and unable to control swelling    Patient requires skilled Physical Therapy services  for the following problem list and secondary functional limitations:    Problem List:   Uncontrolled swelling and/or lack of appropriate compression garment    Secondary Functional Limitations:  Uncontrolled swelling can increase chances of  infection, and limit functional ROM     Patient Goals: Decrease swelling and use as many devices as needed for self care so that I get this under control    Physical Therapy Goals:       In 6-12 months: Pt and/or caregiver will:  Retain an optimal limb volume that allows pt to fit into appropriate compression garments and clothing to improve functional use of UE and QOL      PLAN:       Planned frequency and duration  of treatment:   See  1 x in the next 1 months     Next Visit Plan:  Take circ , prep for discharge if no significant change  MLD, MFR, lymphatouch,      SUBJECTIVE:  Patient???s communication preference: verbal, written, visual, prn   History of Present Illness/ Pt reports:  Has been compliant with self care, had a busy weekend, used arm a lot, notes hand swelling    Location of pain: No pain        OBJECTIVE:  Posture/Observations:  Forward head, rounded shoulders     Range of Motion/Flexibilty:   UE WFL   Axillary Web Syndrome location: no     Strength/MMT:   UE WFL        Girth Measurements: taken in cm    Date: 8-8 8-8 8-30 8-30 9-26 9-26 11-7 11-7      Right Left RIght Left Right Left RIght Left RIght Left   MCP 16.2 16 17 16 18  17.9 18.6 17.5     Mid palm 17 16.6 17.5 17.6 20 18.5 20 17.5     Thumb DIP             1st finger DIP             1st finger PIP             2nd finger DIP             2nd finger PIP             3rd finger DIP 3rd finger PIP             4th finger DIP             4th finger PIP                          Axilla             Mid breast nipple line             Ribs below breast             Natural Waistline             Umbilicus             Hips at  cm below umbilicus             Limb Volume                        Location of swelling:  right hand , wrist, forearm, elbow, arm, shoulder, axilla and chest    Skin condition:  Stemmer???s sign:no.  increased skin thickness and fibrosis, softening with treatment  Incision/Scar:   incision healing well postoperatively    Sensation :  Peripheral neuropathy:no  Light touch:     Decreased sensation under axilla, around scar, and posterior lateral upper arm         Total Treatment Time: 53 min  Treatment Rendered:    Patient and/or caregiver and therapist were mask compliant per current [place] COVID mask policy during the entire treatment session.      Manual x 53 min   Axillary Web syndrome release , Scar massage, Lymphatouch to affected areas with simultaneous MFR and MLD to appropriate anastomoses in order to decrease swelling.  Application of compression garment/devices prn to prevent re-accumulation of fluid.           Equipment provided/recommended:   N/A   Communication/consultation with other professionals:  N/A    Referrals made to the following providers:  none    Medical Necessity: This treatment is medically necessary throughout the course of this patient's life during and after cancer treatments to improve functional activities and /or to minimize the decline of functional abilities and worsening of symptoms,including infection and recurrent hospitalizations, through independent/home management strategies.  Lymphedema, a chronic, progressive condition for which there is no cure, is marked by the accumulation of protein-rich  fluid in one or more quadrants of the body due to primary or secondary disruption of the lymphatic system.  The sustained accumulation results in tissue inflammation, an increase in fatty tissue, and development of obstructive connective tissue.  These changes may result in an increased risk of infection, disfigurement and a decrease in mobility and functional performance.   Pt will benefit from physical therapy by a certified lymphedema therapist to address : education in self care, precautions, and management of symptoms, optimal edema reduction/maintenance of girth, optimal soft tissue changes, fitting of appropriate compression garments, and progression of exercises to optimize functional ROM and strength, balance, and endurance in all ADL's(home, work, recreational, community).      I attest that I have reviewed the above information.  Signed: Clementeen Graham, PT   04/08/2021 5:27 PM

## 2021-04-12 DIAGNOSIS — C50911 Malignant neoplasm of unspecified site of right female breast: Principal | ICD-10-CM

## 2021-04-12 DIAGNOSIS — Z170 Estrogen receptor positive status [ER+]: Principal | ICD-10-CM

## 2021-04-12 NOTE — Unmapped (Signed)
[facility] Refill Coordination Note    Specialty Medication(s) to be Shipped:   Hematology/Oncology: Verzenio 100mg     Other medication(s) to be shipped: No additional medications requested for fill at this time     Tami Gibson, DOB: 07/11/1990  Phone: [phone] (home)       All above HIPAA information was verified with patient.     Was a Nurse, learning disability used for this call? No    Completed refill call assessment today to schedule patient's medication shipment from the [facility] [phone]).  All relevant notes have been reviewed.     Specialty medication(s) and dose(s) confirmed: Regimen is correct and unchanged.   Changes to medications: Tami Gibson reports no changes at this time.  Changes to insurance: No  New side effects reported not previously addressed with a pharmacist or physician: None reported  Questions for the pharmacist: No    Confirmed patient received a Conservation officer, historic buildings and a Surveyor, mining with first shipment. The patient will receive a drug information handout for each medication shipped and additional FDA Medication Guides as required.       DISEASE/MEDICATION-SPECIFIC INFORMATION        N/A    SPECIALTY MEDICATION ADHERENCE     Medication Adherence    Patient reported X missed doses in the last month: 0  Specialty Medication: Verzenio 100mg   Patient is on additional specialty medications: No  Informant: patient              Were doses missed due to medication being on hold? No    Verzenio 100 mg: 7 days of medicine on hand     REFERRAL TO PHARMACIST     Referral to the pharmacist: Not needed      [facility]     Shipping address confirmed in Epic.     Delivery Scheduled: Yes, Expected medication delivery date: 04/17/21.     Medication will be delivered via Next Day Courier to the prescription address in Epic WAM.    Tami Gibson   [facility] Specialty Technician

## 2021-04-16 MED FILL — VERZENIO 100 MG TABLET: ORAL | 28 days supply | Qty: 56 | Fill #5

## 2021-04-17 ENCOUNTER — Institutional Professional Consult (permissible substitution): Admit: 2021-04-17 | Discharge: 2021-04-18 | Payer: PRIVATE HEALTH INSURANCE

## 2021-04-17 DIAGNOSIS — Z170 Estrogen receptor positive status [ER+]: Principal | ICD-10-CM

## 2021-04-17 DIAGNOSIS — C50911 Malignant neoplasm of unspecified site of right female breast: Principal | ICD-10-CM

## 2021-04-17 MED ADMIN — leuprolide (LUPRON) injection 3.75 mg: 3.75 mg | INTRAMUSCULAR | @ 13:00:00 | Stop: 2021-04-17

## 2021-04-17 MED ADMIN — leuprolide (LUPRON) 3.75 mg injection: INTRAMUSCULAR | @ 13:00:00 | Stop: 2021-04-17

## 2021-04-17 NOTE — Unmapped (Signed)
5784 Administered Lupron and patient tolerated well. Applied band-aid .

## 2021-05-08 ENCOUNTER — Telehealth: Admit: 2021-05-08 | Discharge: 2021-05-09 | Payer: PRIVATE HEALTH INSURANCE | Attending: Clinical | Primary: Clinical

## 2021-05-09 ENCOUNTER — Ambulatory Visit
Admit: 2021-05-09 | Payer: PRIVATE HEALTH INSURANCE | Attending: Rehabilitative and Restorative Service Providers" | Primary: Rehabilitative and Restorative Service Providers"

## 2021-05-09 DIAGNOSIS — Z170 Estrogen receptor positive status [ER+]: Principal | ICD-10-CM

## 2021-05-09 DIAGNOSIS — C50911 Malignant neoplasm of unspecified site of right female breast: Principal | ICD-10-CM

## 2021-05-09 MED ORDER — VERZENIO 100 MG TABLET
ORAL_TABLET | Freq: Two times a day (BID) | ORAL | 5 refills | 28 days
Start: 2021-05-09 — End: ?

## 2021-05-09 NOTE — Unmapped (Addendum)
[facility] THERAPY SERVICES [place]  OUTPATIENT PHYSICAL THERAPY  05/09/2021  Note Type: Discharge Note       Patient Name: Tami Gibson  Date of Birth:21-Sep-1990  Diagnosis:   Encounter Diagnoses   Name Primary?   ??? Lymphedema syndrome, postmastectomy Yes   ??? Scar condition and fibrosis of skin    ??? Malignant neoplasm of right breast in female, estrogen receptor positive, unspecified site of breast (CMS-HCC)      Referring MD:  Jobe Marker*     Date of Onset of Impairment-10/20/2020  Date PT Care Plan Established or Reviewed-10/30/2020  Date PT Treatment Started-10/30/2020   Plan of Care Effective Date: 10-30-20 to 01-28-21; 01-29-21 to 04-29-21      Session #:  11      Contraindications:  none  Precautions:  none  Red Flags:  history of cancer, right breast IB    Metastatic cancer:   no      ASSESSMENT/Progress toward goals:  Pt is progressing toward meeting goals, Patient's skin integrity is improving.  Skin is softening with treatment which allows for improved lymph uptake, and decreased risk of infection, Pt is demonstrating proficiency at self care of edema/lymphedema and Pt is demonstrating continued limb/truncal swelling reduction and improve tissue integrity Right UE limb volume increased, had busy weekend.  Is compliant with all self care, will need recheck in one month to check limb volume.  Pt has obtained custom glove from DME.  Limb volumes are stable.  Pt is ready for discharge    Recommendations for treatment/garments:  1) Patient will obtain proper garments to address swelling and improve tissue integrity. Garment recommendations : Mediven Harmony CCL 2 sleeve and glove, Jovi pak night time compression  2) Pt will be seen clinically for rehabilitation to include treatments to address as needed: edema/lymphedema, pain, ROM, strength, balance and endurance deficits, as well as learning self care strategies to address these deficits  3) Pt will require a pneumatic compression pump because despite conservative treatment [id number] garments or devices, exercise, elevation, skin care and self MLD,the patient exhibits the following symptoms:hyperplasia, hyperpigmentation, fibrosis, truncal/abdominal swelling, chest/axillary swelling and unable to control swelling    Patient requires skilled Physical Therapy services  for the following problem list and secondary functional limitations:    Problem List:   Uncontrolled swelling and/or lack of appropriate compression garment    Secondary Functional Limitations:  Uncontrolled swelling can increase chances of  infection, and limit functional ROM     Patient Goals: Decrease swelling and use as many devices as needed for self care so that I get this under control    Physical Therapy Goals:     MET 05-09-21  In 6-12 months: Pt and/or caregiver will:  Retain an optimal limb volume that allows pt to fit into appropriate compression garments and clothing to improve functional use of UE and QOL      PLAN:   Discharge to self care   SUBJECTIVE:  Patient???s communication preference: verbal, written, visual, prn   History of Present Illness/ Pt reports:  Has been compliant with self care, itchy and scratching in axilla and arm  (pt advised NOT to do this for risk of infection, understood)    Location of pain: No pain        OBJECTIVE:      Posture/Observations:  Forward head, rounded shoulders     Range of Motion/Flexibilty:   UE WFL   Axillary Web Syndrome location: no     Strength/MMT:  UE [facility]        Girth Measurements: taken in cm    Date: 8-8 8-8 8-30 8-30 9-26 9-26 11-7 11-7 12-8 12-8    Right Left RIght Left Right Left RIght Left RIght Left   MCP 16.2 16 17 16 18  17.9 18.6 17.5 18.5 17.5   Mid palm 17 16.6 17.5 17.6 20 18.5 20 17.5 19.6 18.7   Thumb DIP             1st finger DIP             1st finger PIP             2nd finger DIP             2nd finger PIP             3rd finger DIP             3rd finger PIP             4th finger DIP             4th finger PIP Axilla             Mid breast nipple line             Ribs below breast             Natural Waistline             Umbilicus             Hips at  cm below umbilicus             Limb Volume                        Location of swelling:  right hand , wrist, forearm, elbow, arm, shoulder, axilla and chest, decreased with treatments     Skin condition:  Stemmer???s sign:no.  increased skin thickness and fibrosis, softening with treatment  Incision/Scar:   incision healing well postoperatively    Sensation :  Peripheral neuropathy:no  Light touch:     Decreased sensation under axilla, around scar, and posterior lateral upper arm   Itchiness in left axilla and arm      Total Treatment Time: 53 min  Treatment Rendered:    Patient and/or caregiver and therapist were mask compliant per current [place] COVID mask policy during the entire treatment session.      Manual x 45 min   Axillary Web syndrome release , Scar massage, Lymphatouch to affected areas with simultaneous MFR and MLD to appropriate anastomoses in order to decrease swelling.  Application of compression garment/devices prn to prevent re-accumulation of fluid.       Self Care: 8 min: reviewed all self care, pt independent    Equipment provided/recommended:   N/A   Communication/consultation with other professionals:  Email to DME re: garment needs  P and O, Jobst Bella Strong 20-30 mm Hg size 7 long, glove, Jobst bella strong AC1 20-30 mm Hg    Referrals made to the following providers:  none    Medical Necessity: This treatment is medically necessary throughout the course of this patient's life during and after cancer treatments to improve functional activities and /or to minimize the decline of functional abilities and worsening of symptoms,including infection and recurrent hospitalizations, through independent/home management strategies.  Lymphedema, a chronic, progressive condition for which there is no cure, is marked by the accumulation of protein-rich  fluid in one or more quadrants of the body due to primary or secondary disruption of the lymphatic system.  The sustained accumulation results in tissue inflammation, an increase in fatty tissue, and development of obstructive connective tissue.  These changes may result in an increased risk of infection, disfigurement and a decrease in mobility and functional performance.   Pt will benefit from physical therapy by a certified lymphedema therapist to address : education in self care, precautions, and management of symptoms, optimal edema reduction/maintenance of girth, optimal soft tissue changes, fitting of appropriate compression garments, and progression of exercises to optimize functional ROM and strength, balance, and endurance in all ADL's(home, work, recreational, community).      I attest that I have reviewed the above information.  Signed: Clementeen Graham, PT   05/09/2021 8:31 AM

## 2021-05-10 ENCOUNTER — Ambulatory Visit
Admit: 2021-05-10 | Discharge: 2021-05-11 | Payer: PRIVATE HEALTH INSURANCE | Attending: Plastic and Reconstructive Surgery | Primary: Plastic and Reconstructive Surgery

## 2021-05-10 DIAGNOSIS — C50911 Malignant neoplasm of unspecified site of right female breast: Principal | ICD-10-CM

## 2021-05-10 DIAGNOSIS — I890 Lymphedema, not elsewhere classified: Principal | ICD-10-CM

## 2021-05-10 DIAGNOSIS — Z170 Estrogen receptor positive status [ER+]: Principal | ICD-10-CM

## 2021-05-10 MED ORDER — ABEMACICLIB 100 MG TABLET
ORAL_TABLET | Freq: Two times a day (BID) | ORAL | 5 refills | 28 days | Status: CP
Start: 2021-05-10 — End: ?
  Filled 2021-05-14: qty 56, 28d supply, fill #0

## 2021-05-10 NOTE — Unmapped (Signed)
PLASTIC SURGERY CLINIC NOTE      Patient Name: Tami Gibson  Patient Age: 30 y.o.  Encounter Date: 05/10/2021    REFERRING PHYSICIAN:  Rudie Meyer, ANP  [address]  [facility]; [address] [place],  [place] [address]    CONSULTING PHYSICIANS:  Patient Care Team:  Durenda Hurt, MD as PCP - General (Internal Medicine)  Roby Lofts, MD  Jacqlyn Larsen, LCSW (Social Work)  [name], MD as Attending Provider (Medical Oncology)  Rogelio Seen, MD as Consulting Physician (Radiation Oncology)    PRIMARY CARE PROVIDER:  Durenda Hurt, MD    REASON FOR VISIT:   RUE lymphedema      ASSESSMENT:   30 y.o. 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    Patient's overall presentation is consistent with Stage I, likely early stage II lymphedema. We discussed the nature of her condition. We discussed options for treatment including, lymphovenous bypass vs vascularized lymph node transfer. The implications, as well as risks and benefits of each option were discussed with the patient. Specifically, there was a question from her primary reconstructive surgeon (Dr. Lafayette Dragon) regarding the feasibility of VLNT at the timing of her planning DIEP flap surgery in March. With regards to VLNT, we discussed that preferred locations include inguinal vs intra-abdominal node harvest. With inguinal nodes, there is still a samll risk of lymphedema in the donor extremity (lower limb).  Given her clinical stage of lymphedema and symptomatology, she is an excellent candidate for lymphovenous bypass and the risk/benefit analysis would favor this physiologic procedure over VLNT.   We also discussed that should she have a poor response to a LVA and have worsening disease in the future, the option for a VLNT will still be there as we could harvest from the omentum.    In the meantime, she should continue with medical management with compression and physical therapy.      PLAN:  - Continue current conservative treatment with Class II lymphedema garment around the clock and lymphedema maintenance therapy  - Recommend weight loss to reduce BMI as well as increased physical activity  - Follow up in 6 months    Attestation  I saw, examined and evaluated the patient, participating in the key elements of the service.    I discussed the findings, assessment and plan with the resident and agree with resident???s findings and plan as documented in the resident's note.    Clydie Braun MD, SM    Assistant Professor,  Division of Plastic Surgery,  [facility]  [place], [place]    HISTORY OF PRESENT ILLNESS:  Tami Gibson is a 30 y.o. female who is seen in consultation at the request of Valentino Saxon, Redgie Grayer* for evaluation of right upper extremity lymphedema.    Briefly, patient has a history of right breast cancer s/p NACT and bilateral SSM + R SLNB with prepectoral TE placement on 04/20/2020. Postoperative course was marked by need for right completion right axillary dissection (DOS 05/07/2020) and adjuvant radiation therapy, which was completed in March 2022. She is planned to undergo bilateral DIEP flap reconstruction with Dr. Lafayette Dragon in March 2023. She is interested in exploring  surgical options for lymphedema.    She is right-hand dominant. She notices predominantly right hand swelling. She has done complete decongestive therapy x 2, most recently from May to December 2022, with recent discharge from PT within the past week. She does NOT have a custom sleeve, but shares she has a custom glove. She wears an off the shelf sleeve during the day with the glove, and wears a different nighttime garment. She also uses the lymphedema pump at night for roughly 1 hour. She overall shares she has not had any lifestyle changing arm swelling as she has been very proactive regarding this. 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 She denies any prior infections of her extremity.     REVIEW OF SYSTEMS:  Negative, except for as noted in HPI    ALLERGIES:  has No Known Allergies.    MEDICATIONS:  Current Outpatient Medications   Medication Sig Dispense Refill   ??? abemaciclib (VERZENIO) 100 mg Tab tablet Take 1 tablet (100 mg total) by mouth Two (2) times a day. Take with or without food. 56 tablet 5   ??? acetaminophen (TYLENOL) 325 MG tablet Take 650 mg by mouth every six (6) hours as needed for pain.     ??? calcium carbonate (TUMS) 200 mg calcium (500 mg) chewable tablet Chew 1 tablet daily.     ??? docusate sodium (COLACE) 100 MG capsule Take 100 mg by mouth nightly as needed (PRN).     ??? ergocalciferol-1,250 mcg, 50,000 unit, (DRISDOL) 1,250 mcg (50,000 unit) capsule Take 1,250 mcg by mouth once a week.     ??? escitalopram oxalate (LEXAPRO) 10 MG tablet Take 1 tablet (10 mg total) by mouth in the morning. 30 tablet 5   ??? fexofenadine (ALLEGRA) 180 MG tablet Take 180 mg by mouth.     ??? letrozole (FEMARA) 2.5 mg tablet Take 1 tablet (2.5 mg total) by mouth daily. 90 tablet 3   ??? omeprazole (PRILOSEC) 20 MG capsule Take 1 capsule (20 mg total) by mouth daily. 90 capsule 3   ??? ondansetron (ZOFRAN-ODT) 4 MG disintegrating tablet Take 1 tablet (4 mg total) by mouth every eight (8) hours as needed for nausea. 30 tablet 3   ??? rimegepant (NURTEC ODT) 75 mg TbDL Take 75 mg by mouth.  (Patient not taking: Reported on 03/13/2021)     ??? SUMAtriptan (IMITREX) 100 MG tablet TAKE ONE TABLET BY MOUTH AT ONSET OF MIGRAINE FOR UP TO 1 DOSE . IF SYMPTOMS PERSIST A SECOND DOSE MAY BE TAKEN IN 2 HOURS IF NEEDED     ??? topiramate (TOPAMAX) 25 MG tablet TAKE 3 TABLETS BY MOUTH ONCE DAILY     ??? topiramate (TOPAMAX) 50 MG tablet Take 25 mg by mouth Two (2) times a day.     ??? vitamin E-134 mg, 200 UNIT, 134 mg (200 UNIT) capsule Take 134 mg by mouth daily.       No current facility-administered medications for this visit.     Facility-Administered Medications Ordered in Other Visits   Medication Dose Route Frequency Provider Last Rate Last Admin   ??? leuprolide (LUPRON) 3.75 mg injection                MEDICAL HISTORY:  Past Medical History:   Diagnosis Date   ??? Acne     started  during teen yrs., Accutane 2008-2009 during college yrs   ??? Anemia     During teen years only, no anemia recently   ??? 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 Gestational hypertension 01/30/2017    end of pregnancy had hypertension, was induced   ??? Headache     migraine occasionally   ??? Hypertension     during week 37 week, pt induced and had C/S due to hypertension   ??? 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 Kidney stone    ??? Malignant neoplasm of right breast in female, 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/30/2019   ??? Pyelonephritis 09/2016    Kidney stones   ??? Urinary tract infection     Had 5-6 in [place], esp high school and college age   ??? Varicella     during childhood   ??? Vitamin B 12 deficiency        SURGICAL HISTORY:  Past Surgical History:   Procedure Laterality Date   ??? BREAST BIOPSY Right     IDC   ??? CESAREAN SECTION  01/30/2017    breech   ??? CHEMOTHERAPY     ??? IR INSERT PORT AGE GREATER THAN 5 YRS  12/08/2019    IR INSERT PORT AGE GREATER THAN 5 YRS 12/08/2019 Jobe Gibbon, MD IMG VIR [facility]   ??? LUMBAR PUNCTURE DIAGNOSTIC [facility] HISTORICAL RESULT)  01/2015    to relieve ICP   ??? PR BX/REMV,LYMPH NODE,DEEP AXILL Right 04/20/2020    Procedure: BX/EXC LYMPH NODE; OPEN, DEEP AXILRY NODE;  Surgeon: Moss Mc, MD;  Location: MAIN OR [facility];  Service: Surgical Oncology Breast   ??? PR CESAREAN DELIVERY ONLY N/A 01/30/2017    Procedure: CESAREAN DELIVERY ONLY;  Surgeon: Asher Muir, MD;  Location: L&D C-SECTION OR SUITES [facility];  Service: Maternal-Fetal Medicine   ??? PR CESAREAN DELIVERY ONLY N/A 02/17/2019    Procedure: CESAREAN DELIVERY ONLY;  Surgeon: Lonny Prude, MD;  Location: L&D C-SECTION OR SUITES [facility];  Service: Maternal-Fetal Medicine   ??? PR IMPLNT BIO IMPLNT FOR SOFT TISSUE REINFORCEMENT Bilateral 04/20/2020    Procedure: IMPLANTATION BIOLOGIC IMPLANT(EG, ACELLULAR DERMAL MATRIX) FOR SOFT TISSUE REINFORCEMENT(EG, BREAST, TRUNK);  Surgeon: Arsenio Katz, MD;  Location: MAIN OR [facility];  Service: Plastics   ??? PR INTRAOPERATIVE SENTINEL LYMPH NODE ID W DYE INJECTION Right 04/20/2020    Procedure: INTRAOPERATIVE IDENTIFICATION SENTINEL LYMPH NODE(S) INCLUDE INJECTION NON-RADIOACTIVE DYE, WHEN PERFORMED;  Surgeon: Moss Mc, MD;  Location: MAIN OR [place];  Service: Surgical Oncology Breast   ??? PR INTRAOPERATIVE SENTINEL LYMPH NODE ID W DYE INJECTION Right 05/07/2020    Procedure: INTRAOPERATIVE IDENTIFICATION SENTINEL LYMPH NODE(S) INCLUDE INJECTION NON-RADIOACTIVE DYE, WHEN PERFORMED;  Surgeon: Moss Mc, MD;  Location: MAIN OR [place];  Service: Surgical Oncology Breast   ??? PR MASTECTOMY, SIMPLE, COMPLETE Bilateral 04/20/2020    Procedure: MASTECTOMY, SIMPLE, COMPLETE;  Surgeon: Moss Mc, MD;  Location: MAIN OR [place];  Service: Surgical Oncology Breast   ??? PR REMOVE ARMPITS LYMPH NODES COMPLT Right 05/07/2020    Procedure: AXILLARY LYMPHADENECTOMY; COMPLETE;  Surgeon: Moss Mc, MD;  Location: MAIN OR [place];  Service: Surgical Oncology Breast   ??? PR TISSUE EXPANDER PLACEMENT BREAST RECONSTRUCTION Bilateral 04/20/2020    Procedure: TISSUE EXPANDER PLACEMENT IN BREAST RECONSTRUCTION, INCLUDING SUBSEQUENT EXPANSION(S);  Surgeon: Arsenio Katz, MD; Location: MAIN OR [facility];  Service: Plastics   ???  TONSILECTOMY, ADENOIDECTOMY, BILATERAL MYRINGOTOMY AND TUBES     ??? 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 She has never used smokeless tobacco.  Alcohol use:  reports that she does not currently use alcohol.  Drug use:  reports no history of drug use.    FAMILY HISTORY:  family history includes ALS in her maternal aunt; Breast cancer in her maternal cousin; Cancer in her paternal aunt and paternal grandmother; Cancer (age of onset: 71) in her maternal aunt; Hypertension in her father; Hypothyroidism in her mother; No Known Problems in her daughter, daughter, maternal aunt, maternal aunt, maternal grandfather, paternal aunt, paternal grandfather, and another family member; Other (age of onset: 61) in her father; Ovarian cancer (age of onset: 47) in her maternal grandmother.        Objective :    Vital Signs for this encounter:  BSA: 2.13 meters squared  BP 109/81  - Pulse 78  - Ht 167.6 cm (5' 6)  - Wt 97.1 kg (214 lb)  - BMI 34.54 kg/m??     General Appearance:  Well developed, well nourished female. No distress   Neurologic: Alert and oriented x 3. Grossly motor intact   HEENT:  Normocephalic, atraumatic, anicteric sclera   Cardiovascular:   Regular rate   Pulmonary: Normal respiratory effort on room air   Abdomen: Soft, non-tender, non-distended.    Musculoskeletal: Full range of motion. Full strength.  Trace pitting edema of the dorsal right hand extending proximal to ulnar    Skin: Warm, dry. No rashes or lesions.        DIAGNOSTIC STUDIES:     Lymphoscintigram (12/2020)  FINDINGS:   There is uptake in multiple left axillary lymph nodes. There is no uptake in the right axillary region although there is intermittent appearance of foci of uptake along the right upper extremity, probably right upper arm which are felt to represent lymphatic track such as on the image at 182 minutes.  ??  IMPRESSION:  -No uptake is seen in the right axillary region, although there is intermittent appearance of foci of uptake along the right upper extremity, probably right upper arm which are felt to represent lymphatic track.

## 2021-05-10 NOTE — Unmapped (Signed)
[facility]  Comprehensive Cancer Support Program   Telehealth Encounter    *non billable encounter*      Encounter Description: This encounter was conducted via telephone in the setting of State of Emergency due to COVID-19 Pandemic.     The patient reports they are currently: not at home. I spent 40 minutes on the phone with the patient on the date of service. I spent an additional 15 minutes on pre- and post-visit activities on the date of service.     The patient was physically located in [place] or a state in which I am permitted to provide care. The patient and/or parent/guardian understood that s/he may incur co-pays and cost sharing, and agreed to the telemedicine visit. The visit was reasonable and appropriate under the circumstances given the patient's presentation at the time.    The patient and/or parent/guardian has been advised of the potential risks and limitations of this mode of treatment (including, but not limited to, the absence of in-person examination) and has agreed to be treated using telemedicine. The patient's/patient's family's questions regarding telemedicine have been answered.     If the visit was completed in an ambulatory setting, the patient and/or parent/guardian has also been advised to contact their provider???s office for worsening conditions, and seek emergency medical treatment and/or call 911 if the patient deems either necessary.      Assessment:  Tami Gibson is a 30 y.o. female with a history of breast cancer. Referred by Luetta Nutting, PT for support navigating survivorship and long term impact of treatment (lymphedema). She describes a normal and healthy emotional response to treatment and is interested in engaging in therapy as she transitions from active treatment to survivorship.     Risk Assessment:  A suicide and violence risk assessment was performed as part of this evaluation. There is no acute risk for suicide or violence at this time.  While future psychiatric events cannot be accurately predicted, the patient does not currently require acute inpatient psychiatric care and does not currently meet [facility] involuntary commitment criteria.       Plan:  Will continue to follow for supportive counseling.      Subjective:   Patient interviewed in a private place, accompanied by no one. Shared updates since our last visit. Overall is doing very well. Starting to anticipate surgery in March. Unexpectedly saw photos on her phone that she had taken prior to her first surgery, which was emotional to see. Feels good about her ability to navigate that experience though. Has been doing more in-person support events, which she enjoys. Looking into retreats (first descents, etc) for the future after she has healed from surgery. Provided supportive counseling, active listening, normalization, and psychoeducation. Follow-up scheduled for January at pt request.     Objective:    Mental Status Exam:  Speech/Language:    Normal rate, volume, tone, fluency   Mood:   euthymic   Thought process and Associations:   Logical, linear, clear, coherent, goal directed   Abnormal/psychotic thought content:     Denies SI, HI, self harm, delusions, obsessions, paranoid ideation, or ideas of reference   Perceptual disturbances:     Does not endorse auditory or visual hallucinations     Orientation:   Oriented to person, place, time, and general circumstances   Insight:     Intact   Judgment:    Intact   Impulse Control:   Intact     [name], LCSW  Comprehensive Cancer Support Program  Phone: [phone]  Pager: [phone]

## 2021-05-10 NOTE — Unmapped (Signed)
[facility] Refill Coordination Note    Specialty Medication(s) to be Shipped:   Hematology/Oncology: Verzenio 100mg     Other medication(s) to be shipped: No additional medications requested for fill at this time     Tami Gibson, DOB: 25-Feb-1991  Phone: [phone] (home)       All above HIPAA information was verified with patient.     Was a Nurse, learning disability used for this call? No    Completed refill call assessment today to schedule patient's medication shipment from the [facility] [phone]).  All relevant notes have been reviewed.     Specialty medication(s) and dose(s) confirmed: Regimen is correct and unchanged.   Changes to medications: Chanita reports no changes at this time.  Changes to insurance: No  New side effects reported not previously addressed with a pharmacist or physician: None reported  Questions for the pharmacist: No    Confirmed patient received a Conservation officer, historic buildings and a Surveyor, mining with first shipment. The patient will receive a drug information handout for each medication shipped and additional FDA Medication Guides as required.       DISEASE/MEDICATION-SPECIFIC INFORMATION        N/A    SPECIALTY MEDICATION ADHERENCE     Medication Adherence    Patient reported X missed doses in the last month: 0  Specialty Medication: VERZENIO 100 mg Tab tablet (abemaciclib)  Patient is on additional specialty medications: No  Informant: patient              Were doses missed due to medication being on hold? No    Verzenio 100 mg: 10 days of medicine on hand     REFERRAL TO PHARMACIST     Referral to the pharmacist: Not needed      [facility]     Shipping address confirmed in Epic.     Delivery Scheduled: Yes, Expected medication delivery date: 05/15/21.  However, Rx request for refills was sent to the provider as there are none remaining.     Medication will be delivered via Next Day Courier to the prescription address in Epic WAM.    [name] [facility] Specialty Technician

## 2021-05-14 DIAGNOSIS — I890 Lymphedema, not elsewhere classified: Principal | ICD-10-CM

## 2021-05-15 ENCOUNTER — Institutional Professional Consult (permissible substitution): Admit: 2021-05-15 | Discharge: 2021-05-16 | Payer: PRIVATE HEALTH INSURANCE

## 2021-05-15 DIAGNOSIS — C50911 Malignant neoplasm of unspecified site of right female breast: Principal | ICD-10-CM

## 2021-05-15 DIAGNOSIS — Z170 Estrogen receptor positive status [ER+]: Principal | ICD-10-CM

## 2021-05-15 MED ADMIN — leuprolide (LUPRON) 3.75 mg injection: INTRAMUSCULAR | @ 13:00:00 | Stop: 2021-05-15

## 2021-05-15 MED ADMIN — leuprolide (LUPRON) injection 3.75 mg: 3.75 mg | INTRAMUSCULAR | @ 13:00:00 | Stop: 2021-05-15

## 2021-05-15 NOTE — Unmapped (Signed)
1610 Administered Lupron and patient tolerated well. Applied band-aid .

## 2021-05-22 MED ORDER — ESCITALOPRAM 10 MG TABLET
ORAL_TABLET | 3 refills | 0 days | Status: CP
Start: 2021-05-22 — End: ?

## 2021-05-22 NOTE — Unmapped (Signed)
Patient is requesting the following refill  Requested Prescriptions     Pending Prescriptions Disp Refills   ??? escitalopram oxalate (LEXAPRO) 10 MG tablet [Pharmacy Med Name: Escitalopram Oxalate 10 MG Oral Tablet] 30 tablet 0     Sig: TAKE 1 TABLET BY MOUTH IN THE MORNING       Order pended. Please advise. Thanks    Last OV: 01/14/21  Next OV: Visit date not found

## 2021-05-23 ENCOUNTER — Ambulatory Visit
Admit: 2021-05-23 | Discharge: 2021-05-24 | Payer: PRIVATE HEALTH INSURANCE | Attending: Rehabilitative and Restorative Service Providers" | Primary: Rehabilitative and Restorative Service Providers"

## 2021-05-24 NOTE — Unmapped (Signed)
Prosthetics and Orthotics Evaluation      Tami Gibson is a 30 y.o. female who presents for evaluation of a Right arm compression sleeve    Current Outpatient Medications on File Prior to Visit   Medication Sig Dispense Refill   ??? abemaciclib (VERZENIO) 100 mg Tab tablet Take 1 tablet (100 mg total) by mouth Two (2) times a day. Take with or without food. 56 tablet 5   ??? acetaminophen (TYLENOL) 325 MG tablet Take 650 mg by mouth every six (6) hours as needed for pain.     ??? calcium carbonate (TUMS) 200 mg calcium (500 mg) chewable tablet Chew 1 tablet daily.     ??? docusate sodium (COLACE) 100 MG capsule Take 100 mg by mouth nightly as needed (PRN).     ??? ergocalciferol-1,250 mcg, 50,000 unit, (DRISDOL) 1,250 mcg (50,000 unit) capsule Take 1,250 mcg by mouth once a week.     ??? escitalopram oxalate (LEXAPRO) 10 MG tablet TAKE 1 TABLET BY MOUTH IN THE MORNING 90 tablet 3   ??? fexofenadine (ALLEGRA) 180 MG tablet Take 180 mg by mouth.     ??? letrozole (FEMARA) 2.5 mg tablet Take 1 tablet (2.5 mg total) by mouth daily. 90 tablet 3   ??? omeprazole (PRILOSEC) 20 MG capsule Take 1 capsule (20 mg total) by mouth daily. 90 capsule 3   ??? ondansetron (ZOFRAN-ODT) 4 MG disintegrating tablet Take 1 tablet (4 mg total) by mouth every eight (8) hours as needed for nausea. 30 tablet 3   ??? rimegepant (NURTEC ODT) 75 mg TbDL Take 75 mg by mouth.  (Patient not taking: Reported on 03/13/2021)     ??? SUMAtriptan (IMITREX) 100 MG tablet TAKE ONE TABLET BY MOUTH AT ONSET OF MIGRAINE FOR UP TO 1 DOSE . IF SYMPTOMS PERSIST A SECOND DOSE MAY BE TAKEN IN 2 HOURS IF NEEDED     ??? topiramate (TOPAMAX) 25 MG tablet TAKE 3 TABLETS BY MOUTH ONCE DAILY     ??? topiramate (TOPAMAX) 50 MG tablet Take 25 mg by mouth Two (2) times a day.     ??? vitamin E-134 mg, 200 UNIT, 134 mg (200 UNIT) capsule Take 134 mg by mouth daily.       Current Facility-Administered Medications on File Prior to Visit   Medication Dose Route Frequency Provider Last Rate Last Admin   ??? leuprolide (LUPRON) 3.75 mg injection              Past Medical History:   Diagnosis Date   ??? Acne     started during teen yrs., Accutane 2008-2009 during college yrs   ??? Anemia     During teen years only, no anemia recently   ??? 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 Gestational hypertension 01/30/2017    end of pregnancy had hypertension, was induced   ??? Headache     migraine occasionally   ??? Hypertension     during week 37 week, pt induced and had C/S due to hypertension   ??? 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 Kidney stone    ??? Malignant neoplasm of right breast in female, 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/30/2019   ??? Pyelonephritis 09/2016    Kidney stones   ??? Urinary tract infection     Had 5-6 in Lifetime, esp high school and college age   ??? Varicella     during childhood   ???  Vitamin B 12 deficiency      Past Surgical History:   Procedure Laterality Date   ??? BREAST BIOPSY Right     IDC   ??? CESAREAN SECTION  01/30/2017    breech   ??? CHEMOTHERAPY     ??? IR INSERT PORT AGE GREATER THAN 5 YRS  12/08/2019    IR INSERT PORT AGE GREATER THAN 5 YRS 12/08/2019 Jobe Gibbon, MD IMG VIR H[facility]   ??? LUMBAR PUNCTURE DIAGNOSTIC [facility] HISTORICAL RESULT)  01/2015    to relieve ICP   ??? PR BX/REMV,LYMPH NODE,DEEP AXILL Right 04/20/2020    Procedure: BX/EXC LYMPH NODE; OPEN, DEEP AXILRY NODE;  Surgeon: Moss Mc, MD;  Location: MAIN OR [facility];  Service: Surgical Oncology Breast   ??? PR CESAREAN DELIVERY ONLY N/A 01/30/2017    Procedure: CESAREAN DELIVERY ONLY;  Surgeon: Asher Muir, MD;  Location: L&D C-SECTION OR SUITES [facility];  Service: Maternal-Fetal Medicine   ??? PR CESAREAN DELIVERY ONLY N/A 02/17/2019    Procedure: CESAREAN DELIVERY ONLY;  Surgeon: Lonny Prude, MD;  Location: L&D C-SECTION OR SUITES [facility];  Service: Maternal-Fetal Medicine   ??? PR IMPLNT BIO IMPLNT FOR SOFT TISSUE REINFORCEMENT Bilateral 04/20/2020    Procedure: IMPLANTATION BIOLOGIC IMPLANT(EG, ACELLULAR DERMAL MATRIX) FOR SOFT TISSUE REINFORCEMENT(EG, BREAST, TRUNK);  Surgeon: Arsenio Katz, MD;  Location: MAIN OR [facility];  Service: Plastics   ??? PR INTRAOPERATIVE SENTINEL LYMPH NODE ID W DYE INJECTION Right 04/20/2020    Procedure: INTRAOPERATIVE IDENTIFICATION SENTINEL LYMPH NODE(S) INCLUDE INJECTION NON-RADIOACTIVE DYE, WHEN PERFORMED;  Surgeon: Moss Mc, MD;  Location: MAIN OR [place];  Service: Surgical Oncology Breast   ??? PR INTRAOPERATIVE SENTINEL LYMPH NODE ID W DYE INJECTION Right 05/07/2020    Procedure: INTRAOPERATIVE IDENTIFICATION SENTINEL LYMPH NODE(S) INCLUDE INJECTION NON-RADIOACTIVE DYE, WHEN PERFORMED;  Surgeon: Moss Mc, MD;  Location: MAIN OR [place];  Service: Surgical Oncology Breast   ??? PR MASTECTOMY, SIMPLE, COMPLETE Bilateral 04/20/2020    Procedure: MASTECTOMY, SIMPLE, COMPLETE;  Surgeon: Moss Mc, MD;  Location: MAIN OR [place];  Service: Surgical Oncology Breast   ??? PR REMOVE ARMPITS LYMPH NODES COMPLT Right 05/07/2020    Procedure: AXILLARY LYMPHADENECTOMY; COMPLETE;  Surgeon: Moss Mc, MD;  Location: MAIN OR [place];  Service: Surgical Oncology Breast   ??? PR TISSUE EXPANDER PLACEMENT BREAST RECONSTRUCTION Bilateral 04/20/2020    Procedure: TISSUE EXPANDER PLACEMENT IN BREAST RECONSTRUCTION, INCLUDING SUBSEQUENT EXPANSION(S);  Surgeon: Arsenio Katz, MD;  Location: MA[facility] OR [facility];  Service: Plastics   ??? TONSILECTOMY, ADENOIDECTOMY, BILATERAL MYRINGOTOMY AND TUBES     ??? WISDOM TOOTH EXTRACTION  2004     Family History   Problem Relation Age of Onset   ??? Cancer Maternal Aunt 45        breast Ca   ??? Hypertension Father    ??? Other Father 25        Benign brain tumor   ??? Hypothyroidism Mother    ??? Cancer Paternal Aunt         Breast Ca dx in 77s   ??? ALS Maternal Aunt    ??? Ovarian cancer Maternal Grandmother 13   ??? No Known Problems Maternal Grandfather ??? Cancer Paternal Grandmother         Unknown Cancer   ??? No Known Problems Paternal Grandfather    ??? No Known Problems Maternal Aunt    ??? No Known Problems Maternal Aunt    ??? Breast cancer Maternal  Cousin         Dx in 67s   ??? No Known Problems Paternal Aunt    ??? No Known Problems Daughter    ??? No Known Problems Daughter    ??? No Known Problems Other    ??? BRCA 1/2 Neg Hx    ??? Colon cancer Neg Hx    ??? Endometrial cancer Neg Hx      Social History     Tobacco Use   ??? Smoking status: Never   ??? Smokeless tobacco: Never   Vaping Use   ??? Vaping Use: Never used   Substance Use Topics   ??? Alcohol use: Not Currently   ??? Drug use: No       History of P&O care: has used compression sleeve and gloves in the past      Physical Exam:  There were no vitals taken for this visit.    Goals:      Reduce swelling in right arm    Assessment: It was a pleasure to meet with [facility] today.  We discussed her compression garment use.  She is good with her current gloves, but her arm sleeves are getting stretched out and have holes.  A few seam holes were mended today but she is in need of replacements.  Measurements taken today for new compression sleeves and it appears that her current size 7 ready to wear devices are reasonable    Replacement Device required due ZO:[id number] device was used on a regular basis prior to needing replacement and Previous device was used solely by intended user and remained in their possession throughout its life  Custom item is needed: Ready to wear sleeves still appear reasonable  Counseled regarding:brace reccomendations, fitting timelines and fitting details / process    patient expressed understanding    Plan:  Device Prefabricated device to be ordered  Return TypeDelivery  Return Visitother will call when items arrive - ideally during this year.    Authorization expectationsauth is approved    P&O Fabrication:  Device Jobst arm sleeve  Manufacturer:  Jobst    Preferences:  Color Selection Black if possible  Configuration Preferences Size 7 long  Strap Preferences   Other Preferences 20-45mmHg    Sourcing expected to be completed in 1 week    Mena Pauls  05/23/2021

## 2021-05-27 ENCOUNTER — Telehealth: Admit: 2021-05-27 | Discharge: 2021-05-28 | Payer: PRIVATE HEALTH INSURANCE

## 2021-05-27 DIAGNOSIS — J069 Acute upper respiratory infection, unspecified: Principal | ICD-10-CM

## 2021-05-27 NOTE — Unmapped (Signed)
[facility] ENT Encounter  This medical encounter was conducted virtually using [email or web address]  [email or web address] protocols.    Patient ID: Tami Gibson is a 30 y.o. female who presents by video interaction for evaluation.    I have identified myself to the patient and conveyed my credentials to [facility].   Patient has signed informed consent on file in medical record.    Present on Video Call: Is there someone else in the room? No.     Assessment/Plan:      Problem List Items Addressed This Visit        Respiratory    Viral URI with cough - Primary     Started with URI symptoms 3 days ago.  Sneezing, runny nose, congestion, cough, loss of voice.  Negative COVID.  No fever.  Recommend likely viral.   Try sudafed and mucinex DM during the day, nyquil at bedtime.   Follow up if symptoms persist or worsen.  Okay to go to work if she can wear a mask and feels well enough to.  Patient verbalizes understanding and has no further questions at this time.          Medication adherence and barriers to the treatment plan have been addressed. Opportunities to optimize healthy behaviors have been discussed. Patient / caregiver voiced understanding.     Subjective:     HPI  Tami Gibson is 30 y.o. and presents today in the [facility] with ENT symptoms.  The PCP for this patient is Durenda Hurt, MD.     Patient reports she started with sneezing, runny nose, congestion, productive cough, losing voice.  Symptoms started Friday 2/23.  Negative COVID.  No fever.  Children and MIL are sick with similar symptoms.  Taking dayquil and nyqil, zicam elderberry lozenge, Emergen-C packets.    ROS  Review of Systems     All other ROS per HPI.    I have reviewed the problem list, past medical history, past family history, medications, and allergies and have updated/reconciled them if needed.     Objective:   Physical Exam  As part of this Video Visit, no in-person exam was conducted.  Video interaction permitted the following observations.    General: No acute distress.   HEENT: No visible mass or abnormality of neck.   RESP: Relaxed respiratory effort. No conversational dyspnea.   SKIN: No rashes noted.  NEURO: Normal coordination.  No tremors observed.  PSYCH: Alert and oriented.  Speech fluent and sensible.  Calm affect.       The patient reports they are currently: at home. I spent 7 minutes on the real-time audio and video with the patient on the date of service. I spent an additional 3 minutes on pre- and post-visit activities on the date of service.     The patient was physically located in [place] or a state in which I am permitted to provide care. The patient and/or parent/guardian understood that s/he may incur co-pays and cost sharing, and agreed to the telemedicine visit. The visit was reasonable and appropriate under the circumstances given the patient's presentation at the time.    The patient and/or parent/guardian has been advised of the potential risks and limitations of this mode of treatment (including, but not limited to, the absence of in-person examination) and has agreed to be treated using telemedicine. The patient's/patient's family's questions regarding telemedicine have been answered.     If the visit was completed in an ambulatory setting,  the patient and/or parent/guardian has also been advised to contact their provider???s office for worsening conditions, and seek emergency medical treatment and/or call 911 if the patient deems either necessary.

## 2021-05-28 NOTE — Unmapped (Signed)
Started with URI symptoms 3 days ago.  Sneezing, runny nose, congestion, cough, loss of voice.  Negative COVID.  No fever.  Recommend likely viral.   Try sudafed and mucinex DM during the day, nyquil at bedtime.   Follow up if symptoms persist or worsen.  Okay to go to work if she can wear a mask and feels well enough to.  Patient verbalizes understanding and has no further questions at this time.

## 2021-05-30 ENCOUNTER — Ambulatory Visit: Admit: 2021-05-30 | Discharge: 2021-05-31 | Payer: PRIVATE HEALTH INSURANCE

## 2021-05-30 NOTE — Unmapped (Signed)
[facility] [place]  Prosthetics & Orthotics  [phone]    RUE Compression Sleeves Delivery    Actions taken today     Devices provided:   Device Data    Base Device  Quantity: 2  Side: Right  Description: Jobst Bella Strong, 20-68mmHg, size 7, silicone, blk, long, compression arm sleeve  Part Number: [id number]  Serial Number: [id number]  Manufacturer: Other (Jobst)  Warranty: 90 Days  Action: Delivered             Adjustments made    Size    Alignment    Other          Goals     Short Term:  1. Reduce swelling in R arm  2. Wear device full time  Long Term:  1. Improve extremity blood flow  2. Reduce chance of blood clots  3. Reduce and prevent future swelling      Assessment   Assessment: Tami Gibson was seen today for fitting and delivery of R arm sleeves (x2). PMHx of lymphedema. She has used compression garments in the past. Sleeve was fit without incident and fit well to patient contours. Providing appropriate compression. Patient able to don and placed glove over sleeve without issue. Patient is an experienced compression garment user. She is familiar with device use, purpose, and confirms comfort with sleeve. Encouraged them to contact O&P if any adjustments needed.     Device checked for safety and security during the visit today.  Manufacturer instructions provided with sleeves.        Plan    Return Visit:  As needed

## 2021-06-06 NOTE — Unmapped (Signed)
[facility] Refill Coordination Note    Specialty Medication(s) to be Shipped:   Hematology/Oncology: Verzenio 100mg     Other medication(s) to be shipped: No additional medications requested for fill at this time     Tami Gibson, DOB: [birth year]  Phone: [phone] (home)       All above HIPAA information was verified with patient.     Was a Nurse, learning disability used for this call? No    Completed refill call assessment today to schedule patient's medication shipment from the [facility] [phone]).  All relevant notes have been reviewed.     Specialty medication(s) and dose(s) confirmed: Regimen is correct and unchanged.   Changes to medications: Tami Gibson reports no changes at this time.  Changes to insurance: No  New side effects reported not previously addressed with a pharmacist or physician: None reported  Questions for the pharmacist: No    Confirmed patient received a Conservation officer, historic buildings and a Surveyor, mining with first shipment. The patient will receive a drug information handout for each medication shipped and additional FDA Medication Guides as required.       DISEASE/MEDICATION-SPECIFIC INFORMATION        N/A    SPECIALTY MEDICATION ADHERENCE     Medication Adherence    Patient reported X missed doses in the last month: 0  Specialty Medication: Verzenio 100mg   Patient is on additional specialty medications: No  Informant: patient              Were doses missed due to medication being on hold? No    Verzenio 100 mg: 10 days of medicine on hand     REFERRAL TO PHARMACIST     Referral to the pharmacist: Not needed      [facility]     Shipping address confirmed in Epic.     Delivery Scheduled: Yes, Expected medication delivery date: 06/12/21.     Medication will be delivered via Next Day Courier to the prescription address in Epic WAM.    [name]   [facility] Specialty Technician

## 2021-06-07 ENCOUNTER — Ambulatory Visit: Admit: 2021-06-07 | Discharge: 2021-06-08 | Payer: PRIVATE HEALTH INSURANCE

## 2021-06-07 DIAGNOSIS — C50911 Malignant neoplasm of unspecified site of right female breast: Principal | ICD-10-CM

## 2021-06-07 DIAGNOSIS — Z170 Estrogen receptor positive status [ER+]: Principal | ICD-10-CM

## 2021-06-07 NOTE — Unmapped (Signed)
Tami Gibson, great to meet you today.  Glad to hear that things are going well.  Please don't hesitate to reach out if our team can help with anything in the coming months with surgery.    Plan to see Tami Gibson in 6 months for annual survivorship visit.

## 2021-06-07 NOTE — Unmapped (Signed)
Adolescent and Young Adult (AYA) Cancer Program Visit      Date Seen: 06/07/2021    Most Relevant AYA Topics Discussed:   Brief survivorship visit   Upcoming reconstructive surgery     Notes:  Met with Tami Gibson and Dr. West Carbo for f/u in survivorship clinic.  Tami Gibson is doing well, overall.  She continues to work and engage in all of the aspects of her life she values.  She has reconstructive surgery in March and is appropriately anxious about this, but managing that anxiety well.     No other major survivorship issues.     Plan/Intervention/Resources Given:   See back in survivorship clinic in 6 months with Tami Pall Kashvi Prevette, LCSW

## 2021-06-08 NOTE — Unmapped (Signed)
[facility]oles[facility] Survivorship [facility]: New Patient    Name: Tami Gibson  Age: 31 y.o.  Date: 06/07/2021    Primary Care Provider:  Durenda Hurt, MD    Referring Provider:  Dr. Adonis Brook    Reason for visit:   We are seeing Tami Gibson at the request of Dr. Adonis Brook to evaluate and provide recommendations for AYA-focused cancer survivorship care.    Assessment:  Tami Gibson is a 31 y.o. yo female with a history of Stage IB (cT1c, cN1, cM0, G1, ER+, PR+, HER2-) right breast cancer. She has been off active therapy (chemo, surgery, radiation) since 3/22 and is now receiving hormone-directed + CDK inhibitor. She presents for evaluation and recommendations for cancer survivorship care.     Today was a follow up visit with me and Lauren Lux for a symptom and psychosocial check in. Has upcoming breast reconstruction planned and is appropriately anxious about this all the family needs that must be in order prior to the prolonged rehabilitation. We discussed support resources and encouraged her to contact our team during this time with any questions or needs.     Plan Summary:  - Return to AYA survivorship clinic in six months  - Continue ongoing care with breast oncologist Dr. Laney Pastor including management of lupron, letrozole and abemiciclib  - Labs: CBC w/ diff 10//22 normal; cr 1.10; normal hepatic function  - Next echo 2026  - Next DEXA 2024  - Continue lexapro and one-on-one therapy with LCSW Tami Gibson  - Lauren Lux saw Tami Gibson as well and provided psychosocial support and resources  ??  Plan Detail:  Potential long-term adverse effects include:    Secondary Cancer (Doxorubicin, Cyclophosphamide, Radiation): - no concerning symptoms, CBC 10/22 normal  - Annual survivorship evaluation including risk-based history, physical exam, CBC.   - Recommend annual skin check and use of SPF >=30 sunscreen    Early heart disease (Doxorubicin, Radiation): CAD, arrhythmias or cardiomyopathy; TAD 240mg /m2  - EKG: baseline following completion of therapy (8/21: normal sinus rhythm, nonspecific T wave abnormality)  - Echocardiogram: following completion of therapy (10/21 WNL EF 55-60%) [and every 5 years (COG LTFU Guidelines)]; baseline study prior to treatment (7/21 - WNL EF 55%) - next due 2026  - BP today in clinic 117/80, within goal range  Attention should be given to CVD risk factors including the maintenance of a healthy weight (today's BMI is Body mass index is 35.54 kg/m??. which is classified as obese), participation in regular exercise, consumption of a healthy diet, limitation of alcohol consumption, and avoidance of tobacco use. Evaluation for dyslipidemia and diabetes should be performed by the patient's PCP as recommended.  - We discussed importance of regular physical activity such as walking    Decreased fertility/premature menopause/sexual dysfunction (Cyclophosphamide): no further children desired. Lupron + condoms for contraception. Low libido but not currently interested in interventions given other priorities.  - Has discussed interventions for vaginal dryness and discomfort with Melissa in past conversations.   - vitamin E has helped significantly with medication-related hot flashes    Bladder problems (Cyclophosphamide): no complaints;    Peripheral neuropathies (Paclitaxel): no complaints    Scarring of lung tissue (Radiation): no SOB/cough    Decreased bone density (letrozole, lupron): baseline DEXA 09/12/20 showed normal bone mineral density.  - Being managed by breast oncologist given ongoing aromatase inhibitor. On vitamin D/calcium supplementation. Next DEXA due 2024 per breast oncologist.    Cognitive difficulties: c/o issues with short  term memory and attention. No intervention currently warranted as patient has tools to manage.    Mood and anxiety disorders: anxiety about upcoming reconstruction  - LCSW Lauren Lux joined visit for assessment. Ongoing psychotherapy with Tami Gibson. - Continuing 10mg  lexapro prescribed by PCP.    Tissue fibrosis/skin changes (Radiation): has rad onc 6 month f/u in the coming month    Lymphedema (Surgery): continues use of supportive garments/apparatus.    Dental problems:  - recommend twice annual dental checks and cleanings    Health Maintenance:   - A standard cancer screening schedule should be followed for the patient including annual physical with routine screening (PAP smear, glucose, lipids, BP)    I personally spent 55 minutes face-to-face and non-face-to-face in the care of this patient, which includes all pre, intra, and post visit time on the date of service.     Vallery Ridge, MD MSc  Adolescent and Young Adult Cancer Program  [facility]  06/08/2021      HPI:  Oncology History Overview Note   31 year old female with recently self palpated right breast lump. A targeted right breast ultrasound on 11/15/2019 showed an irregular mass with spiculated margins at the 7:00 position, 6 cm from the nipple, measuring 1.4 x 1.2 x 1.0 cm. There were 2 abnormal level 1 axillary lymph nodes measuring 1.4 x 1.3 cm and 1.2 x 0.9 cm. Additionally, there was a high level 1/level 2 axillary lymph node, measuring 2.1 x 1.3 cm with an effaced fatty hilum.      Malignant neoplasm of right breast in female, estrogen receptor positive (CMS-HCC)   11/09/2019 Initial Diagnosis    Malignant neoplasm of right breast in female, estrogen receptor positive (CMS-HCC)     11/15/2019 Biopsy    Invasive ductal carcinoma  - Nottingham combined histologic grade: 1  ER 91-100%, PR 91-100%, HER2 negative by IHC    Lymph node, right axilla, core biopsy  - Lymph node positive for metastatic carcinoma,      12/02/2019 -  Cancer Staged    Staging form: Breast, AJCC 8th Edition  - Clinical: Stage IB (cT1c, cN1, cM0, G1, ER+, PR+, HER2-) - Signed by [name], MD on 12/02/2019       12/09/2019 - 03/22/2020 Chemotherapy    OP BREAST AC (Dose Dense) q2W X 4 CYCLES, THEN PACLITAXEL (DOSE DENSE) Q2W x 4 CYCLES  DOXOrubicin 60 mg/m2 IV on day 1, cyclophosphamide 600 mg/m2 IV on day 1, every 2 weeks for 4 cycles, then PACLitaxel 175 mg/m2 every 2 weeks for 4 cycles      04/20/2020 Surgery    Breast, right, simple mastectomy  -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 Extracapsular extension is identified      Breast, left, simple mastectomy  -Benign breast tissue with no atypia, in situ or invasive carcinoma identified     04/20/2020 -  Cancer Staged    Staging form: Breast, AJCC 8th Edition  - Pathologic stage from 04/20/2020: No Stage Recommended (ypT1c, pN1a, cM0, G2, ER+, PR+, HER2-) - Signed by [name], MD on 04/25/2020       05/07/2020 Surgery    Lymph nodes, right axillary, dissection  -One of sixteen lymph nodes involved by metastatic adenocarcinoma (1/16)  -Metastatic focus measures 3 mm in greatest dimension  -No extracapsular extension is identified  06/28/2020 - 08/06/2020 Radiation    The total radiation dose will be 5000 cGy at 200 cGy/fraction for a total of 25 fractions, treated once a day     08/13/2020 Endocrine/Hormone Therapy    Letrozole 2.5mg  +Ovarian suppression (monthly lupron)     Malignant neoplasm of right breast in female, estrogen receptor positive (CMS-HCC)   05/07/2020 Surgery    Lymph nodes, right axillary, dissection  -One of sixteen lymph nodes involved by metastatic adenocarcinoma (1/16)  -Metastatic focus measures 3 mm in greatest dimension  -No extracapsular extension is identified     06/13/2020 -  Radiation    Radiation Therapy Treatment Details (Noted on 06/13/2020)  Site: Right Breast - Overlapping sites  Technique: 3D CRT  Goal: No goal specified  Planned Treatment Start Date: No planned start date specified     06/21/2020 -  Radiation    Radiation Therapy Treatment Details (Noted on 06/21/2020)  Site: Right Breast - Overlapping sites  Technique: 3D CRT  Goal: No goal specified  Planned Treatment Start Date: No planned start date specified       SUMMARY OF CANCER TREATMENT:  Diagnosis:  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+, HER2-)  Date of Diagnosis: 11/2019   Date off Therapy: chemo complete 10/21, surgery 11/21; 12/21, radiation 3/22; ovarian suppression (letrozole/lupron)/abemaciclib ongoing  Chemotherapy: ddAC-T (doxorubicin, cyclophosphamide, paclitaxel)    Total Anthracycline Dose:  240 mg/m2  Total Alkylator Dose: 2.4 g/m2    Radiation Therapy: 5,000 cGy right breast and regional nodes (undissected axilla, SCV, IMNs)  Surgery:  Bilateral skin-sparing mastectomy, right targeted axillary dissection (TAD), port removal (04/20/2020); right axillary lymph node dissection (05/07/2020)    Interval History:  Tami Gibson is generally doing well. Has anxiety with upcoming breast reconstruction. Feels well. Mood has been good, continues on lexapro 10mg . Lymphedema has been managed with compression garments and use of pneumatic pressure device overnight. Adverse effects of hormone therapy are manageable. She has had significant improvement with vitamin E supplement.     Past Medical, Surgical, Social and Family History were reviewed and pertinent updates were made in the Electronic Medical Record    MEDS:    Current Outpatient Medications on File Prior to Encounter   Medication Sig   ??? abemaciclib (VERZENIO) 100 mg Tab tablet Take 1 tablet (100 mg total) by mouth Two (2) times a day. Take with or without food.   ??? acetaminophen (TYLENOL) 325 MG tablet Take 650 mg by mouth every six (6) hours as needed for pain.   ??? calcium carbonate (TUMS) 200 mg calcium (500 mg) chewable tablet Chew 1 tablet daily.   ??? docusate sodium (COLACE) 100 MG capsule Take 100 mg by mouth nightly as needed (PRN).   ??? ergocalciferol-1,250 mcg, 50,000 unit, (DRISDOL) 1,250 mcg (50,000 unit) capsule Take 1,250 mcg by mouth once a week.   ??? escitalopram oxalate (LEXAPRO) 10 MG tablet TAKE 1 TABLET BY MOUTH IN THE MORNING   ??? fexofenadine (ALLEGRA) 180 MG tablet Take 180 mg by mouth.   ??? letrozole (FEMARA) 2.5 mg tablet Take 1 tablet (2.5 mg total) by mouth daily.   ??? omeprazole (PRILOSEC) 20 MG capsule Take 1 capsule (20 mg total) by mouth daily.   ??? ondansetron (ZOFRAN-ODT) 4 MG disintegrating tablet Take 1 tablet (4 mg total) by mouth every eight (8) hours as needed for nausea.   ??? rimegepant (NURTEC ODT) 75 mg TbDL Take 75 mg by mouth.  (Patient not taking: Reported on 03/13/2021)   ???  SUMAtriptan (IMITREX) 100 MG tablet TAKE ONE TABLET BY MOUTH AT ONSET OF MIGRAINE FOR UP TO 1 DOSE . IF SYMPTOMS PERSIST A SECOND DOSE MAY BE TAKEN IN 2 HOURS IF NEEDED   ??? topiramate (TOPAMAX) 25 MG tablet TAKE 3 TABLETS BY MOUTH ONCE DAILY   ??? topiramate (TOPAMAX) 50 MG tablet Take 25 mg by mouth Two (2) times a day.   ??? vitamin E-134 mg, 200 UNIT, 134 mg (200 UNIT) capsule Take 134 mg by mouth daily.     Current Facility-Administered Medications on File Prior to Encounter   Medication   ??? leuprolide (LUPRON) 3.75 mg injection     ALLERGIES:  No Known Allergies    Physical Exam:  Vitals:    06/07/21 0951   BP: 117/80   Pulse: 76   Resp: 17   Temp: 36.7 ??C (98.1 ??F)   TempSrc: Oral   SpO2: 99%   Weight: 99.9 kg (220 lb 3.2 oz)     GENERAL: alert and interactive, appropriately responsive, NAD  HEENT: sclerae clear, PERRL  PULM: normal effort and rate, clear in all fields, no wheezes, rales or rhonchi  CV: s1s2, RRR, no MRGs  EXT: right arm in compression sleeve, otherwise, no swelling or edema, good pulses and cap refill  NEURO: normal mental status, CN intact, normal gait and cerebellar function.      Labs:    No visits with results within 1 Day(s) from this visit.   Latest known visit with results is:   Lab on 03/13/2021   Component Date Value Ref Range Status   ??? Sodium 03/13/2021 138  135 - 145 mmol/L Final   ??? Potassium 03/13/2021 3.8  3.4 - 4.8 mmol/L Final   ??? Chloride 03/13/2021 104  98 - 107 mmol/L Final   ??? CO2 03/13/2021 25.0  20.0 - 31.0 mmol/L Final   ??? Anion Gap 03/13/2021 9  5 - 14 mmol/L Final   ??? BUN 03/13/2021 17  9 - 23 mg/dL Final   ??? Creatinine 03/13/2021 1.10 (H)  0.60 - 0.80 mg/dL Final   ??? BUN/Creatinine Ratio 03/13/2021 15   Final   ??? eGFR CKD-EPI (2021) Female 03/13/2021 69  >=60 mL/min/1.66m2 Final    eGFR calculated with CKD-EPI 2021 equation in accordance with [employer] and [employer] of Nephrology Task Force recommendations.   ??? Glucose 03/13/2021 81  70 - 179 mg/dL Final   ??? Calcium 16/03/9603 9.8  8.7 - 10.4 mg/dL Final   ??? Albumin 54/01/8118 4.1  3.4 - 5.0 g/dL Final   ??? Total Protein 03/13/2021 7.4  5.7 - 8.2 g/dL Final   ??? Total Bilirubin 03/13/2021 0.5  0.3 - 1.2 mg/dL Final   ??? AST 14/78/2956 26  <=34 U/L Final   ??? ALT 03/13/2021 20  10 - 49 U/L Final   ??? Alkaline Phosphatase 03/13/2021 110  46 - 116 U/L Final   ??? WBC 03/13/2021 3.6  3.6 - 11.2 10*9/L Final   ??? RBC 03/13/2021 4.35  3.95 - 5.13 10*12/L Final   ??? HGB 03/13/2021 11.8  11.3 - 14.9 g/dL Final   ??? HCT 21/30/8657 34.6  34.0 - 44.0 % Final   ??? MCV 03/13/2021 79.7  77.6 - 95.7 fL Final   ??? MCH 03/13/2021 27.1  25.9 - 32.4 pg Final   ??? MCHC 03/13/2021 34.0  32.0 - 36.0 g/dL Final   ??? RDW 84/69/6295 14.8  12.2 - 15.2 % Final   ???  MPV 03/13/2021 7.7  6.8 - 10.7 fL Final   ??? Platelet 03/13/2021 192  150 - 450 10*9/L Final   ??? Neutrophils % 03/13/2021 60.3  % Final   ??? Lymphocytes % 03/13/2021 28.7  % Final   ??? Monocytes % 03/13/2021 8.7  % Final   ??? Eosinophils % 03/13/2021 1.5  % Final   ??? Basophils % 03/13/2021 0.8  % Final   ??? Absolute Neutrophils 03/13/2021 2.1  1.8 - 7.8 10*9/L Final   ??? Absolute Lymphocytes 03/13/2021 1.0 (L)  1.1 - 3.6 10*9/L Final   ??? Absolute Monocytes 03/13/2021 0.3  0.3 - 0.8 10*9/L Final   ??? Absolute Eosinophils 03/13/2021 0.1  0.0 - 0.5 10*9/L Final   ??? Absolute Basophils 03/13/2021 0.0  0.0 - 0.1 10*9/L Final   ??? Microcytosis 03/13/2021 Slight (A)  Not Present Final     I personally spent 35 minutes face-to-face and non-face-to-face in the care of this patient, which includes all pre, intra, and post visit time on the date of service.  All documented time was specific to the E/M visit and does not include any procedures that may have been performed.

## 2021-06-10 ENCOUNTER — Ambulatory Visit: Admit: 2021-06-10 | Discharge: 2021-06-10 | Payer: PRIVATE HEALTH INSURANCE

## 2021-06-10 ENCOUNTER — Ambulatory Visit
Admit: 2021-06-10 | Discharge: 2021-06-10 | Payer: PRIVATE HEALTH INSURANCE | Attending: Adult Health | Primary: Adult Health

## 2021-06-10 ENCOUNTER — Other Ambulatory Visit: Admit: 2021-06-10 | Discharge: 2021-06-10 | Payer: PRIVATE HEALTH INSURANCE

## 2021-06-10 DIAGNOSIS — Z170 Estrogen receptor positive status [ER+]: Principal | ICD-10-CM

## 2021-06-10 DIAGNOSIS — C50911 Malignant neoplasm of unspecified site of right female breast: Principal | ICD-10-CM

## 2021-06-10 LAB — CBC W/ AUTO DIFF
BASOPHILS ABSOLUTE COUNT: 0 10*9/L (ref 0.0–0.1)
BASOPHILS RELATIVE PERCENT: 0.6 %
EOSINOPHILS ABSOLUTE COUNT: 0.1 10*9/L (ref 0.0–0.5)
EOSINOPHILS RELATIVE PERCENT: 1.6 %
HEMATOCRIT: 37.2 % (ref 34.0–44.0)
HEMOGLOBIN: 12.5 g/dL (ref 11.3–14.9)
LYMPHOCYTES ABSOLUTE COUNT: 1.2 10*9/L (ref 1.1–3.6)
LYMPHOCYTES RELATIVE PERCENT: 30.5 %
MEAN CORPUSCULAR HEMOGLOBIN CONC: 33.6 g/dL (ref 32.0–36.0)
MEAN CORPUSCULAR HEMOGLOBIN: 27.6 pg (ref 25.9–32.4)
MEAN CORPUSCULAR VOLUME: 81.9 fL (ref 77.6–95.7)
MEAN PLATELET VOLUME: 7.1 fL (ref 6.8–10.7)
MONOCYTES ABSOLUTE COUNT: 0.3 10*9/L (ref 0.3–0.8)
MONOCYTES RELATIVE PERCENT: 7.2 %
NEUTROPHILS ABSOLUTE COUNT: 2.3 10*9/L (ref 1.8–7.8)
NEUTROPHILS RELATIVE PERCENT: 60.1 %
PLATELET COUNT: 188 10*9/L (ref 150–450)
RED BLOOD CELL COUNT: 4.54 10*12/L (ref 3.95–5.13)
RED CELL DISTRIBUTION WIDTH: 14.9 % (ref 12.2–15.2)
WBC ADJUSTED: 3.8 10*9/L (ref 3.6–11.2)

## 2021-06-10 LAB — COMPREHENSIVE METABOLIC PANEL
ALBUMIN: 3.8 g/dL (ref 3.4–5.0)
ALKALINE PHOSPHATASE: 114 U/L (ref 46–116)
ALT (SGPT): 18 U/L (ref 10–49)
ANION GAP: 8 mmol/L (ref 5–14)
BILIRUBIN TOTAL: 0.4 mg/dL (ref 0.3–1.2)
BLOOD UREA NITROGEN: 8 mg/dL — ABNORMAL LOW (ref 9–23)
BUN / CREAT RATIO: 8
CALCIUM: 9.4 mg/dL (ref 8.7–10.4)
CHLORIDE: 109 mmol/L — ABNORMAL HIGH (ref 98–107)
CO2: 21 mmol/L (ref 20.0–31.0)
CREATININE: 1.05 mg/dL — ABNORMAL HIGH
EGFR CKD-EPI (2021) FEMALE: 73 mL/min/{1.73_m2} (ref >=60)
GLUCOSE RANDOM: 81 mg/dL (ref 70–179)
PROTEIN TOTAL: 7.5 g/dL (ref 5.7–8.2)
SODIUM: 138 mmol/L (ref 135–145)

## 2021-06-10 LAB — POTASSIUM: POTASSIUM: 4 mmol/L (ref 3.4–4.8)

## 2021-06-10 MED ADMIN — leuprolide (LUPRON) 3.75 mg injection: INTRAMUSCULAR | @ 16:00:00 | Stop: 2021-06-10

## 2021-06-10 MED ADMIN — leuprolide (LUPRON) injection 3.75 mg: 3.75 mg | INTRAMUSCULAR | @ 16:00:00 | Stop: 2021-06-10

## 2021-06-10 NOTE — Unmapped (Signed)
It was a pleasure to see you today in the Breast Surgical Oncology Clinic. Please call my nurse navigator, Alinda Dooms, if you have any interval questions or concerns.     Follow up in 1 year for exam.  Continue surveillance as outlined. NCCN recommends that you continue to see your primary care provider for all general health care recommended for a patient your age, including cancer screening tests.  Any symptoms should be brought to the attention of your provider.  You should see your medical oncologist every 3 to 6 months for the first 3 years, every 6 to 12 months for years 4 and 5, and annually thereafter. Visits with your surgeon and radiation oncologist should be rotated with your medical oncology team initially until you are released to medical oncology or a survivorship clinic. The surgery visit should typically be paired with the imaging appointment so that we can review mammogram findings and recommendations.       Surgical oncology contact information:  For appointments, please call [phone].     Nurse Navigator available Monday through Friday 8:30 am to 4:30 pm:   Alinda Dooms, RN:    Phone: [phone]   Fax: [phone] attention Alinda Dooms, RN    For emergencies, evenings or weekends, please call [phone] and ask for the surgical oncology resident on call. Please be aware that this person is also responding to in-hospital emergencies and patient issues and may not answer your phone call immediately, but will return your call as soon as possible.

## 2021-06-10 NOTE — Unmapped (Signed)
1055 Administered Lupron and patient tolerated well. Applied band-aid .    1100 Labs drawn peripherally and sent for analysis. Bandage applied after hemostasis.

## 2021-06-10 NOTE — Unmapped (Signed)
Breast Oncology Return Patient Evaluation  Referring Physician: [name], Md  [address]  [place],  [place] [address].  PCP: Durenda Hurt, MD  Breast Med/Onc: Adonis Brook, MD    Cancer Team  Surgical Oncology: Jobe Marker, MD  Radiation Oncology: Rayetta Humphrey, MD  Medical Oncology: Adonis Brook, MD  Plastic Surgery: Levan Hurst, MD  Genetics: Audree Camel, MD    Reason for Visit: A 31 y.o. female with breast cancer referred for consultation for recommendations concerning the management of breast cancer.    Assessment/Plan:    Cancer Staging   Malignant neoplasm of right breast in female, estrogen receptor positive (CMS-HCC)  Staging form: Breast, AJCC 8th Edition  - Clinical: Stage IB (cT1c, cN1, cM0, G1, ER+, PR+, HER2-) - Signed by [name], MD on 12/02/2019  - Pathologic stage from 04/20/2020: No Stage Recommended (ypT1c, pN1a, cM0, G2, ER+, PR+, HER2-) - Signed by [name], MD on 04/25/2020  ??  #Stage IB (cT1c, cN1, cM0, G1, ER+, PR+, HER2-)right breast cancer    Neoadjuvant chemotherapy  Completed ddAC followed by DDTaxol on 03/21/20    Locoregional therapy  -s/p Right mastectomy with SLN and prophylactic left mastectomy  -Pathology consistent with residual disease, MD Dareen Piano residual cancer burden class: RCB-III  With 2/3 positive LNs    - ALND completed on 05/07/2020-1/16 lymph nodes involved. Total 3 LNs involved by metastatic adenocarcinoma  -Completed adjuvant RT on 08/06/2020    Adjuvant therapy  Ovarian suppression (lupron) + Letrozole - SOT 08/09/2020  Adjuvant Abemaciclib - SOT 08/29/2020. Dose reduced to 100mg  BID in June 2022 to improve tolerance. She reports tolerating well.   -Continue Abemaciclib 100 mg BID     Staging scans  -Patient had baseline staging scans which are negative for any metastatic disease.   -Repeat scans on March 2022 showed no evidence of metastasis; ( this was completed in the setting of the patient needing reassurance) #Hot flashes  -Patient endorses hot flashes since initiation of aromatase inhibition therapy; she reports taking vit E which has been helpful.   -Discussed pharmacologic options including oxybutynin, gabapentin, or cross-tapering escitalopram (managed by PCP for anxiety) for venlafaxine, but patient continues opts for no pharmacologic therapy at this time    #Syncope/Presyncope  -Patient has had multiple episodes of presyncope with tunnel vision and one episode of syncope with preceding tunnel vision and lightheadedness all occurring approximately 4 to 5 days after chemo infusions.  No positional component and she is trying to stay well-hydrated.    -Extensive work-up has been unrevealing, including labs, chest CTA, brain MRI, CT head.  TTE prior to start of chemotherapy showed structurally normal heart. EKG was also normal at the time and 2 week Zio-patch w/o evidence of any concerning arrhythmia  -Overall, presentation seems most consistent with vasovagal etiology, unlikely to be cardiogenic. No symptoms since she was done with [facility].  - Cardiology following - followup limited TTE unremarkable  -Resolved    # Bone health  -Baseline bone mineral density evaluation prior to starting an aromatase inhibitor was normal  -Repeat in March 2024  ??  ??# Supportive care  - Psychosocial: Supportive mother and husband. Follows with [facility] and [facility]  -Heartburn: Daily PPI prescribed  -Hotflashes: as above  -Lymphedema: Continues to wear her compression sleeve and follows with physical therapy.  -Diarrhea: Imodium as needed  -Planning reconstruction DIEP on March 14th; would hold Abemacicilib 3 days prior to surgery in  setting of risk of neutropenia.     DISPO:  --Fu in 3 months, labs prior  --Continue lupron monthly with nurse visits    -----------------------------------------------------------------------------------------------------------------------------------------------    HPI: Tami Gibson is a 31 y.o. female who is seen for routine follow-up of management of breast cancer.     Interval History:  She is tolerating abemaciclib well at this reduced dose. 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 She otherwise denies any new or unusual pain, no chest pain or shortness of breath.    She is planning to undergo breast reconstruction in March; she reports some anxious regarding this but overall coping okay.     --Performance status=0    Breast Cancer History  Oncology History Overview Note   31 year old female with recently self palpated right breast lump. A targeted right breast ultrasound on 11/15/2019 showed an irregular mass with spiculated margins at the 7:00 position, 6 cm from the nipple, measuring 1.4 x 1.2 x 1.0 cm. There were 2 abnormal level 1 axillary lymph nodes measuring 1.4 x 1.3 cm and 1.2 x 0.9 cm. Additionally, there was a high level 1/level 2 axillary lymph node, measuring 2.1 x 1.3 cm with an effaced fatty hilum.      Malignant neoplasm of right breast in female, estrogen receptor positive (CMS-HCC)   11/09/2019 Initial Diagnosis    Malignant neoplasm of right breast in female, estrogen receptor positive (CMS-HCC)     11/15/2019 Biopsy    Invasive ductal carcinoma  - Nottingham combined histologic grade: 1  ER 91-100%, PR 91-100%, HER2 negative by IHC    Lymph node, right axilla, core biopsy  - Lymph node positive for metastatic carcinoma,      12/02/2019 -  Cancer Staged    Staging form: Breast, AJCC 8th Edition  - Clinical: Stage IB (cT1c, cN1, cM0, G1, ER+, PR+, HER2-) - Signed by [name], MD on 12/02/2019       12/09/2019 - 03/22/2020 Chemotherapy    OP BREAST AC (Dose Dense) q2W X 4 CYCLES, THEN PACLITAXEL (DOSE DENSE) Q2W x 4 CYCLES  DOXOrubicin 60 mg/m2 IV on day 1, cyclophosphamide 600 mg/m2 IV on day 1, every 2 weeks for 4 cycles, then PACLitaxel 175 mg/m2 every 2 weeks for 4 cycles      04/20/2020 Surgery    Breast, right, simple mastectomy  -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 Extracapsular extension is identified      Breast, left, simple mastectomy  -Benign breast tissue with no atypia, in situ or invasive carcinoma identified     04/20/2020 -  Cancer Staged    Staging form: Breast, AJCC 8th Edition  - Pathologic stage from 04/20/2020: No Stage Recommended (ypT1c, pN1a, cM0, G2, ER+, PR+, HER2-) - Signed by [name], MD on 04/25/2020       05/07/2020 Surgery    Lymph nodes, right axillary, dissection  -One of sixteen lymph nodes involved by metastatic adenocarcinoma (1/16)  -Metastatic focus measures 3 mm in greatest dimension  -No extracapsular extension is identified     06/28/2020 - 08/06/2020 Radiation    The total radiation dose will be 5000 cGy at 200 cGy/fraction for a total of 25 fractions, treated once a day     08/13/2020 Endocrine/Hormone Therapy  Letrozole 2.5mg  +Ovarian suppression (monthly lupron)     Malignant neoplasm of right breast in female, estrogen receptor positive (CMS-HCC)   05/07/2020 Surgery    Lymph nodes, right axillary, dissection  -One of sixteen lymph nodes involved by metastatic adenocarcinoma (1/16)  -Metastatic focus measures 3 mm in greatest dimension  -No extracapsular extension is identified     06/13/2020 -  Radiation    Radiation Therapy Treatment Details (Noted on 06/13/2020)  Site: Right Breast - Overlapping sites  Technique: 3D CRT  Goal: No goal specified  Planned Treatment Start Date: No planned start date specified     06/21/2020 -  Radiation    Radiation Therapy Treatment Details (Noted on 06/21/2020)  Site: Right Breast - Overlapping sites  Technique: 3D CRT  Goal: No goal specified  Planned Treatment Start Date: No planned start date specified         Past Medical History  Past Medical History:   Diagnosis Date   ??? Acne started during teen yrs., Accutane 2008-2009 during college yrs   ??? Anemia     During teen years only, no anemia recently   ??? 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 Gestational hypertension 01/30/2017    end of pregnancy had hypertension, was induced   ??? Headache     migraine occasionally   ??? Hypertension     during week 37 week, pt induced and had C/S due to hypertension   ??? 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 Kidney stone    ??? Malignant neoplasm of right breast in female, 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/30/2019   ??? Pyelonephritis 09/2016    Kidney stones   ??? Urinary tract infection     Had 5-6 in Lifetime, esp high school and college age   ??? Varicella     during childhood   ??? Vitamin B 12 deficiency        Reproductive/GYN History  OB History   Gravida Para Term Preterm AB Living   2 2 2  0 0 2   SAB IAB Ectopic Molar Multiple Live Births   0 0 0 0 0 2      # Outcome Date GA Lbr Len/2nd Weight Sex Delivery Anes PTL Lv   2 Term 02/17/19 [redacted]w[redacted]d  3545 g (7 lb 13 oz) F CS-LTranv Spinal, EPI N LIV      Complications: Failure to Progress in First Stage      Name: Frances,CARTER RAE      Apgar1: 8  Apgar5: 9   1 Term 01/30/17 [redacted]w[redacted]d  2875 g (6 lb 5.4 oz) F CS-LTranv Spinal, EPI N LIV      Complications: Hypertension, Breech birth      Name: Kalis,OAKLEY      Apgar1: 8  Apgar5: 9      Obstetric Comments   OB-History reviewed by Lucilla Lame RN on 02/22/2019.   Gardasil series completed   Last pap:  2017; NIL   No abn paps   No hx STI's       Surgical History  Past Surgical History:   Procedure Laterality Date   ??? BREAST BIOPSY Right     IDC   ??? CESAREAN SECTION  01/30/2017    breech   ??? CHEMOTHERAPY     ??? IR INSERT PORT AGE GREATER THAN 5 YRS  12/08/2019    IR INSERT PORT AGE GREATER THAN 5 YRS  12/08/2019 Jobe Gibbon, MD IMG VIR H&V [facility]   ??? LUMBAR PUNCTURE DIAGNOSTIC [facility] HISTORICAL RESULT)  01/2015    to relieve ICP   ??? PR BX/REMV,LYMPH NODE,DEEP AXILL Right 04/20/2020    Procedure: BX/EXC LYMPH NODE; OPEN, DEEP AXILRY NODE;  Surgeon: Moss Mc, MD;  Location: MAIN OR [place] [facility];  Service: Surgical Oncology Breast   ??? PR CESAREAN DELIVERY ONLY N/A 01/30/2017    Procedure: CESAREAN DELIVERY ONLY;  Surgeon: Asher Muir, MD;  Location: L&D C-SECTION OR SUITES [facility];  Service: Maternal-Fetal Medicine   ??? PR CESAREAN DELIVERY ONLY N/A 02/17/2019    Procedure: CESAREAN DELIVERY ONLY;  Surgeon: Lonny Prude, MD;  Location: L&D C-SECTION OR SUITES [facility];  Service: Maternal-Fetal Medicine   ??? PR IMPLNT BIO IMPLNT FOR SOFT TISSUE REINFORCEMENT Bilateral 04/20/2020    Procedure: IMPLANTATION BIOLOGIC IMPLANT(EG, ACELLULAR DERMAL MATRIX) FOR SOFT TISSUE REINFORCEMENT(EG, BREAST, TRUNK);  Surgeon: Arsenio Katz, MD;  Location: MAIN OR [facility];  Service: Plastics   ??? PR INTRAOPERATIVE SENTINEL LYMPH NODE ID W DYE INJECTION Right 04/20/2020    Procedure: INTRAOPERATIVE IDENTIFICATION SENTINEL LYMPH NODE(S) INCLUDE INJECTION NON-RADIOACTIVE DYE, WHEN PERFORMED;  Surgeon: Moss Mc, MD;  Location: MAIN OR [place];  Service: Surgical Oncology Breast   ??? PR INTRAOPERATIVE SENTINEL LYMPH NODE ID W DYE INJECTION Right 05/07/2020    Procedure: INTRAOPERATIVE IDENTIFICATION SENTINEL LYMPH NODE(S) INCLUDE INJECTION NON-RADIOACTIVE DYE, WHEN PERFORMED;  Surgeon: Moss Mc, MD;  Location: MAIN OR [place];  Service: Surgical Oncology Breast   ??? PR MASTECTOMY, SIMPLE, COMPLETE Bilateral 04/20/2020    Procedure: MASTECTOMY, SIMPLE, COMPLETE;  Surgeon: Moss Mc, MD;  Location: MAIN OR [place];  Service: Surgical Oncology Breast   ??? PR REMOVE ARMPITS LYMPH NODES COMPLT Right 05/07/2020    Procedure: AXILLARY LYMPHADENECTOMY; COMPLETE;  Surgeon: Moss Mc, MD;  Location: MAIN OR [place];  Service: Surgical Oncology Breast   ??? PR TISSUE EXPANDER PLACEMENT BREAST RECONSTRUCTION Bilateral 04/20/2020    Procedure: TISSUE EXPANDER PLACEMENT IN BREAST RECONSTRUCTION, INCLUDING SUBSEQUENT EXPANSION(S);  Surgeon: Arsenio Katz, MD;  Location: MA[facility] OR [facility];  Service: Plastics   ??? TONSILECTOMY, ADENOIDECTOMY, BILATERAL MYRINGOTOMY AND TUBES     ??? WISDOM TOOTH EXTRACTION  2004       Medications    Current Outpatient Medications:   ???  abemaciclib (VERZENIO) 100 mg Tab tablet, Take 1 tablet (100 mg total) by mouth Two (2) times a day. Take with or without food., Disp: 56 tablet, Rfl: 5  ???  acetaminophen (TYLENOL) 325 MG tablet, Take 650 mg by mouth every six (6) hours as needed for pain., Disp: , Rfl:   ???  calcium carbonate (TUMS) 200 mg calcium (500 mg) chewable tablet, Chew 1 tablet daily., Disp: , Rfl:   ???  ergocalciferol-1,250 mcg, 50,000 unit, (DRISDOL) 1,250 mcg (50,000 unit) capsule, Take 1,250 mcg by mouth once a week., Disp: , Rfl:   ???  escitalopram oxalate (LEXAPRO) 10 MG tablet, TAKE 1 TABLET BY MOUTH IN THE MORNING, Disp: 90 tablet, Rfl: 3  ???  fexofenadine (ALLEGRA) 180 MG tablet, Take 180 mg by mouth., Disp: , Rfl:   ???  letrozole (FEMARA) 2.5 mg tablet, Take 1 tablet (2.5 mg total) by mouth daily., Disp: 90 tablet, Rfl: 3  ???  omeprazole (PRILOSEC) 20 MG capsule, Take 1 capsule (20 mg total) by mouth daily., Disp: 90 capsule, Rfl: 3  ???  ondansetron (ZOFRAN-ODT) 4 MG disintegrating tablet, Take 1 tablet (4 mg total)  by mouth every eight (8) hours as needed for nausea., Disp: 30 tablet, Rfl: 3  ???  rimegepant (NURTEC ODT) 75 mg TbDL, Take 75 mg by mouth., Disp: , Rfl:   ???  SUMAtriptan (IMITREX) 100 MG tablet, TAKE ONE TABLET BY MOUTH AT ONSET OF MIGRAINE FOR UP TO 1 DOSE . IF SYMPTOMS PERSIST A SECOND DOSE MAY BE TAKEN IN 2 HOURS IF NEEDED, Disp: , Rfl:   ???  topiramate (TOPAMAX) 25 MG tablet, TAKE 3 TABLETS BY MOUTH ONCE DAILY, Disp: , Rfl:   ???  topiramate (TOPAMAX) 50 MG tablet, Take 25 mg by mouth Two (2) times a day., Disp: , Rfl:   ???  vitamin E-134 mg, 200 UNIT, 134 mg (200 UNIT) capsule, Take 134 mg by mouth daily., Disp: , Rfl:   ???  docusate sodium (COLACE) 100 MG capsule, Take 100 mg by mouth nightly as needed (PRN). (Patient not taking: Reported on 06/10/2021), Disp: , Rfl:   No current facility-administered medications for this visit.    Facility-Administered Medications Ordered in Other Visits:   ???  leuprolide (LUPRON) 3.75 mg injection, , , ,     Allergies  No Known Allergies    Personal and Social History  Social History     Social History Narrative    PRECONCEPTUAL ASSESSMENT:        Tami Gibson is a 31 y.o. Caucasian female    Caffeine:denies use    Cats: no    Exercise: not active    Diet: balanced    Family hx of of birth defects, chromosomal abnormality, intellectual disability, developmental delay: no.            PARTNER HISTORY:     Domingo Dimes is a 59 y.o.  Caucasian female.    Occupation:  Immunologist; [employer]    He has fathered children:  no    He is healthy with no chronic illness    Family history of  birth defects, chromosomal abnormality, intellectual disability, or developmental delay: no.                Family History  Family History   Problem Relation Age of Onset   ??? Cancer Maternal Aunt 45        breast Ca   ??? Hypertension Father    ??? Other Father 18        Benign brain tumor   ??? Hypothyroidism Mother    ??? Cancer Paternal Aunt         Breast Ca dx in 74s   ??? ALS Maternal Aunt    ??? Ovarian cancer Maternal Grandmother 43   ??? No Known Problems Maternal Grandfather    ??? Cancer Paternal Grandmother         Unknown Cancer   ??? No Known Problems Paternal Grandfather    ??? No Known Problems Maternal Aunt    ??? No Known Problems Maternal Aunt    ??? Breast cancer Maternal Cousin         Dx in 70s   ??? No Known Problems Paternal Aunt    ??? No Known Problems Daughter    ??? No Known Problems Daughter    ??? No Known Problems Other    ??? BRCA 1/2 Neg Hx    ??? Colon cancer Neg Hx    ??? Endometrial cancer Neg Hx          Review of Systems: A 12-system review of systems was obtained including: Constitutional, Eyes, ENT,  Cardiovascular, Respiratory, GI, GU, Musculoskeletal, Skin, Neurological, Psychiatric, Endocrine, Heme/Lymphatic, and Allergic/Immunologic systems. It is negative or non-contributory to the patient???s management except for the following: See Interval History.    Physical Examination:  Vital Signs: BP 120/85  - Pulse 75  - Temp 36.2 ??C (97.2 ??F) (Temporal)  - Resp 18  - SpO2 97%   General:  Healthy-appearing female in no acute distress..  Cardiovasc:  No heaves, regular, no additional sounds. No lower extremity edema.    Respiratory:  Chest clear to percussion and auscultation, unlabored  Gastrointestinal:  Soft, nontender, no hepatomegaly.   Musculoskeletal:  No bony pain or tenderness.   Skin and Subcutaneous Tissues: No significant rash.  Psychiatric: Mood is normal.  No other symptoms.   Neuro:  Alert and oriented.  Gait and coordination normal  Breasts: Bilateral mastectomies with expanders in place. Well healed scars.  Heme/Lymphatic/Immunologic: No supraclavicular or cervical adenopathy    I have personally reviewed the following diagnostic studies:    Breast imaging and pathology reviewed. See Results for details

## 2021-06-12 NOTE — Unmapped (Signed)
Tami Gibson 's Verzenio shipment will be delayed as a result of a high copay.     I have reached out to the patient  at [phone] via text and communicated the delay. We will wait for a call back from the patient to reschedule the delivery.  We have not confirmed the new delivery date.      Patient must apply for copay card, [facility] waiting on billing info from patient.

## 2021-06-13 MED FILL — VERZENIO 100 MG TABLET: ORAL | 28 days supply | Qty: 56 | Fill #1

## 2021-06-13 NOTE — Unmapped (Signed)
Tami Gibson called back about the delivery for verzenio and would like the delivery to ship out 06/13/2021 via [employer] or [employer] to be delivered 06/13/2021 . We have confirmed the delivery via [employer] .

## 2021-06-20 NOTE — Unmapped (Signed)
Patient Name: Tami Gibson  Patient Age: 31 y.o.  Encounter Date: 06/10/2021    Referring Physician:   Rudie Meyer, ANP  [address]  [facility] [address]; [address] [place],  [place] [address]    Primary Care Provider:  Durenda Hurt, MD    Breast Cancer Treatment Team:  Surgical Oncology: Jobe Marker, MD  Surgical Oncology NP: Pollyann Samples, NP  Radiation Oncology: Rogelio Seen, MD  Medical Oncology: Adonis Brook, MD  Plastic Surgery: Lamarr Lulas, MD  Genetics: [employer] STAT panel    Reason for Visit: Breast cancer surveillance visit    HPI:  Tami Gibson is a 31 y.o. female who presents for followup after breast cancer surgery. Below is a synopsis of past and present health information pertinent to her breast cancer diagnosis, treatment and current health status. She is unaccompanied today. Tami Gibson was last seen in clinic on 11/19/2020. Since that visit she denies associated symptoms including pain, redness, fever, chills, breast mass, breast retraction, nipple inversion, breast swelling or nipple discharge.  She has been having difficulty with lymphedema.  She has been discharged from PT, but continues to wear a compression sleeve and gauntlet.  She also has a pump at home.  She has been evaluated by Dr. Clydie Braun for possible surgical intervention, but has been recommended to monitor for six months.  She is scheduled for DIEP with Dr. Lafayette Dragon on March 14th.  She continues on Lupron and letrozole with associated hot flashes for which she takes Vit E.  Her dose of Verzenio has been decreased due to diarrhea, which is improved. No other specific complaints today. No other changes to her medical, surgical, family or social history.     Tami Gibson initially presented with significant right breast pain.  Her PCP referred her for imaging evaluation.  Mammography/ultrasound showed a 2 cm spiculated mass in the lower outer right breast as well as at least 3 abnormal axillary nodes. Biopsy revealed right breast IDC, G1, ER/PR+ and HER2 -. At our initial visit, I determined Tami Gibson would need additional workup prior to final surgical planning. She had an enlarged axillary node at this time and was recommended biopsy of the node. This came back as positive for metastatic carcinoma and she was referred for neoadjuvant chemotherapy. She underwent ddAC followed by ddTaxol under the direction of Dr. Laney Pastor completed in October 2021. Given residual disease in the right axilla after neoadjuvant chemotherapy after bilateral SSM, right targeted axillary dissection (TAD) protocol and infusaport removal on 04/20/2020, the patient underwent right ALND with ARM on 05/07/2020.    Diagnosis:    1. Right breast infiltrating ductal carcinoma   Cancer Staging   Malignant neoplasm of right breast in female, estrogen receptor positive (CMS-HCC)  Staging form: Breast, AJCC 8th Edition  - Clinical: Stage IB (cT1c, cN1, cM0, G1, ER+, PR+, HER2-) - Signed by [name], MD on 12/02/2019  - Pathologic stage from 04/20/2020: No Stage Recommended (ypT1c, pN1a, cM0, G2, ER+, PR+, HER2-) - Signed by [name], MD on 04/25/2020      Procedure:   1. Bilateral skin-sparing mastectomy, right targeted axillary dissection (TAD) protocol and infusaport removal, performed on 04/20/2020.  2. Right axillary lymph node dissection with right axillary reverse mapping procedure, performed on 05/07/2020      Clinical Trial Participant:   1. No clinical trials at this time.    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       Medical history reviewed as below. I have also reviewed additional records as provided.  Oncology History Overview Note   31 year old female with recently self palpated right breast lump. A targeted right breast ultrasound on 11/15/2019 showed an irregular mass with spiculated margins at the 7:00 position, 6 cm from the nipple, measuring 1.4 x 1.2 x 1.0 cm. There were 2 abnormal level 1 axillary lymph nodes measuring 1.4 x 1.3 cm and 1.2 x 0.9 cm. Additionally, there was a high level 1/level 2 axillary lymph node, measuring 2.1 x 1.3 cm with an effaced fatty hilum.      Malignant neoplasm of right breast in female, estrogen receptor positive (CMS-HCC)   11/09/2019 Initial Diagnosis    Malignant neoplasm of right breast in female, estrogen receptor positive (CMS-HCC)     11/15/2019 Biopsy    Invasive ductal carcinoma  - Nottingham combined histologic grade: 1  ER 91-100%, PR 91-100%, HER2 negative by IHC    Lymph node, right axilla, core biopsy  - Lymph node positive for metastatic carcinoma,      12/02/2019 -  Cancer Staged    Staging form: Breast, AJCC 8th Edition  - Clinical: Stage IB (cT1c, cN1, cM0, G1, ER+, PR+, HER2-) - Signed by [name], MD on 12/02/2019       12/09/2019 - 03/22/2020 Chemotherapy    OP BREAST AC (Dose Dense) q2W X 4 CYCLES, THEN PACLITAXEL (DOSE DENSE) Q2W x 4 CYCLES  DOXOrubicin 60 mg/m2 IV on day 1, cyclophosphamide 600 mg/m2 IV on day 1, every 2 weeks for 4 cycles, then PACLitaxel 175 mg/m2 every 2 weeks for 4 cycles      04/20/2020 Surgery    Breast, right, simple mastectomy  -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 Extracapsular extension is identified      Breast, left, simple mastectomy  -Benign breast tissue with no atypia, in situ or invasive carcinoma identified     04/20/2020 -  Cancer Staged    Staging form: Breast, AJCC 8th Edition  - Pathologic stage from 04/20/2020: No Stage Recommended (ypT1c, pN1a, cM0, G2, ER+, PR+, HER2-) - Signed by [name], MD on 04/25/2020       05/07/2020 Surgery    Lymph nodes, right axillary, dissection  -One of sixteen lymph nodes involved by metastatic adenocarcinoma (1/16)  -Metastatic focus measures 3 mm in greatest dimension  -No extracapsular extension is identified     06/28/2020 - 08/06/2020 Radiation    The total radiation dose will be 5000 cGy at 200 cGy/fraction for a total of 25 fractions, treated once a day     08/13/2020 Endocrine/Hormone Therapy    Letrozole 2.5mg  +Ovarian suppression (monthly lupron)     Malignant neoplasm of right breast in female, estrogen receptor positive (CMS-HCC)   05/07/2020 Surgery    Lymph nodes, right axillary, dissection  -One of sixteen lymph nodes involved by metastatic adenocarcinoma (1/16)  -Metastatic focus measures 3 mm in greatest dimension  -No extracapsular extension is identified     06/13/2020 -  Radiation    Radiation Therapy Treatment Details (Noted  on 06/13/2020)  Site: Right Breast - Overlapping sites  Technique: 3D CRT  Goal: No goal specified  Planned Treatment Start Date: No planned start date specified     06/21/2020 -  Radiation    Radiation Therapy Treatment Details (Noted on 06/21/2020)  Site: Right Breast - Overlapping sites  Technique: 3D CRT  Goal: No goal specified  Planned Treatment Start Date: No planned start date specified       Past Medical History:   Diagnosis Date   ??? Acne     started during teen yrs., Accutane 2008-2009 during college yrs   ??? Anemia     During teen years only, no anemia recently   ??? 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 Gestational hypertension 01/30/2017    end of pregnancy had hypertension, was induced   ??? Headache     migraine occasionally   ??? Hypertension     during week 37 week, pt induced and had C/S due to hypertension   ??? 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 Kidney stone    ??? Malignant neoplasm of right breast in female, 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/30/2019   ??? Pyelonephritis 09/2016    Kidney stones   ??? Urinary tract infection     Had 5-6 in Lifetime, esp high school and college age   ??? Varicella     during childhood   ??? Vitamin B 12 deficiency      Past Surgical History:   Procedure Laterality Date   ??? BREAST BIOPSY Right     IDC   ??? CESAREAN SECTION  01/30/2017    breech   ??? CHEMOTHERAPY     ??? IR INSERT PORT AGE GREATER THAN 5 YRS  12/08/2019    IR INSERT PORT AGE GREATER THAN 5 YRS 12/08/2019 Jobe Gibbon, MD IMG VIR H&V [facility]   ??? LUMBAR PUNCTURE DIAGNOSTIC [facility] HISTORICAL RESULT)  01/2015    to relieve ICP   ??? PR BX/REMV,LYMPH NODE,DEEP AXILL Right 04/20/2020    Procedure: BX/EXC LYMPH NODE; OPEN, DEEP AXILRY NODE;  Surgeon: Moss Mc, MD;  Location: MAIN OR [facility];  Service: Surgical Oncology Breast   ??? PR CESAREAN DELIVERY ONLY N/A 01/30/2017    Procedure: CESAREAN DELIVERY ONLY;  Surgeon: Asher Muir, MD;  Location: L&D C-SECTION OR SUITES [facility];  Service: Maternal-Fetal Medicine   ??? PR CESAREAN DELIVERY ONLY N/A 02/17/2019    Procedure: CESAREAN DELIVERY ONLY;  Surgeon: Lonny Prude, MD;  Location: L&D C-SECTION OR SUITES [facility];  Service: Maternal-Fetal Medicine   ??? PR IMPLNT BIO IMPLNT FOR SOFT TISSUE REINFORCEMENT Bilateral 04/20/2020    Procedure: IMPLANTATION BIOLOGIC IMPLANT(EG, ACELLULAR DERMAL MATRIX) FOR SOFT TISSUE REINFORCEMENT(EG, BREAST, TRUNK);  Surgeon: Arsenio Katz, MD;  Location: MAIN OR [facility];  Service: Plastics   ??? PR INTRAOPERATIVE SENTINEL LYMPH NODE ID W DYE INJECTION Right 04/20/2020    Procedure: INTRAOPERATIVE IDENTIFICATION SENTINEL LYMPH NODE(S) INCLUDE INJECTION NON-RADIOACTIVE DYE, WHEN PERFORMED;  Surgeon: Moss Mc, MD;  Location: MAIN OR [facility];  Service: Surgical Oncology Breast   ??? PR INTRAOPERATIVE SENTINEL LYMPH NODE ID W DYE INJECTION Right 05/07/2020    Procedure: INTRAOPERATIVE IDENTIFICATION SENTINEL LYMPH NODE(S) INCLUDE INJECTION NON-RADIOACTIVE DYE, WHEN PERFORMED;  Surgeon: Moss Mc, MD;  Location: MAIN OR ;  Service: Surgical Oncology Breast   ??? PR MASTECTOMY, SIMPLE, COMPLETE Bilateral 04/20/2020    Procedure: MASTECTOMY, SIMPLE,  COMPLETE;  Surgeon: Moss Mc, MD;  Location: MAIN OR [facility];  Service: Surgical Oncology Breast   ??? PR REMOVE ARMPITS LYMPH NODES COMPLT Right 05/07/2020    Procedure: AXILLARY LYMPHADENECTOMY; COMPLETE;  Surgeon: Moss Mc, MD;  Location: MAIN OR [facility];  Service: Surgical Oncology Breast   ??? PR TISSUE EXPANDER PLACEMENT BREAST RECONSTRUCTION Bilateral 04/20/2020    Procedure: TISSUE EXPANDER PLACEMENT IN BREAST RECONSTRUCTION, INCLUDING SUBSEQUENT EXPANSION(S);  Surgeon: Arsenio Katz, MD;  Location: MAIN OR [facility];  Service: Plastics   ??? TONSILECTOMY, ADENOIDECTOMY, BILATERAL MYRINGOTOMY AND TUBES     ??? WISDOM TOOTH EXTRACTION  2004     Family History   Problem Relation Age of Onset   ??? Cancer Maternal Aunt 45        breast Ca   ??? Hypertension Father    ??? Other Father 65        Benign brain tumor   ??? Hypothyroidism Mother    ??? Cancer Paternal Aunt         Breast Ca dx in 54s   ??? ALS Maternal Aunt    ??? Ovarian cancer Maternal Grandmother 15   ??? No Known Problems Maternal Grandfather    ??? Cancer Paternal Grandmother         Unknown Cancer   ??? No Known Problems Paternal Grandfather    ??? No Known Problems Maternal Aunt    ??? No Known Problems Maternal Aunt    ??? Breast cancer Maternal Cousin         Dx in 18s   ??? No Known Problems Paternal Aunt    ??? No Known Problems Daughter    ??? No Known Problems Daughter    ??? No Known Problems Other    ??? BRCA 1/2 Neg Hx    ??? Colon cancer Neg Hx    ??? Endometrial cancer Neg Hx      Social History     Socioeconomic History   ??? Marital status: Married     Spouse name: Dylan   ??? Number of children: 0   Occupational History   ??? Occupation: Recreational therapist    Tobacco Use   ??? Smoking status: Never   ??? Smokeless tobacco: Never Vaping Use   ??? Vaping Use: Never used   Substance and Sexual Activity   ??? Alcohol use: Not Currently   ??? Drug use: No   ??? Sexual activity: Yes     Partners: Male     Birth control/protection: Pill   Other Topics Concern   ??? Exercise Yes   ??? Living Situation No   Social History Narrative    PRECONCEPTUAL ASSESSMENT:        Carys is a 31 y.o. Caucasian female    Caffeine:denies use    Cats: no    Exercise: not active    Diet: balanced    Family hx of of birth defects, chromosomal abnormality, intellectual disability, developmental delay: no.            PARTNER HISTORY:     Domingo Dimes is a 46 y.o.  Caucasian female.    Occupation:  Immunologist; [employer]    He has fathered children:  no    He is healthy with no chronic illness    Family history of  birth defects, chromosomal abnormality, intellectual disability, or developmental delay: no.                Current Outpatient Medications:   ???  abemaciclib (VERZENIO) 100 mg Tab tablet, Take 1 tablet (100 mg total) by mouth Two (2) times a day. Take with or without food., Disp: 56 tablet, Rfl: 5  ???  acetaminophen (TYLENOL) 325 MG tablet, Take 650 mg by mouth every six (6) hours as needed for pain., Disp: , Rfl:   ???  calcium carbonate (TUMS) 200 mg calcium (500 mg) chewable tablet, Chew 1 tablet daily., Disp: , Rfl:   ???  docusate sodium (COLACE) 100 MG capsule, Take 100 mg by mouth nightly as needed (PRN). (Patient not taking: Reported on 06/10/2021), Disp: , Rfl:   ???  ergocalciferol-1,250 mcg, 50,000 unit, (DRISDOL) 1,250 mcg (50,000 unit) capsule, Take 1,250 mcg by mouth once a week., Disp: , Rfl:   ???  escitalopram oxalate (LEXAPRO) 10 MG tablet, TAKE 1 TABLET BY MOUTH IN THE MORNING, Disp: 90 tablet, Rfl: 3  ???  fexofenadine (ALLEGRA) 180 MG tablet, Take 180 mg by mouth., Disp: , Rfl:   ???  letrozole (FEMARA) 2.5 mg tablet, Take 1 tablet (2.5 mg total) by mouth daily., Disp: 90 tablet, Rfl: 3  ???  omeprazole (PRILOSEC) 20 MG capsule, Take 1 capsule (20 mg total) by mouth daily., Disp: 90 capsule, Rfl: 3  ???  ondansetron (ZOFRAN-ODT) 4 MG disintegrating tablet, Take 1 tablet (4 mg total) by mouth every eight (8) hours as needed for nausea., Disp: 30 tablet, Rfl: 3  ???  rimegepant (NURTEC ODT) 75 mg TbDL, Take 75 mg by mouth., Disp: , Rfl:   ???  SUMAtriptan (IMITREX) 100 MG tablet, TAKE ONE TABLET BY MOUTH AT ONSET OF MIGRAINE FOR UP TO 1 DOSE . IF SYMPTOMS PERSIST A SECOND DOSE MAY BE TAKEN IN 2 HOURS IF NEEDED, Disp: , Rfl:   ???  topiramate (TOPAMAX) 25 MG tablet, TAKE 3 TABLETS BY MOUTH ONCE DAILY, Disp: , Rfl:   ???  topiramate (TOPAMAX) 50 MG tablet, Take 25 mg by mouth Two (2) times a day., Disp: , Rfl:   ???  vitamin E-134 mg, 200 UNIT, 134 mg (200 UNIT) capsule, Take 134 mg by mouth daily., Disp: , Rfl:   No current facility-administered medications for this visit.    Facility-Administered Medications Ordered in Other Visits:   ???  leuprolide (LUPRON) 3.75 mg injection, , , ,   No Known Allergies      Physical Examination:   Blood pressure 134/89, pulse 75, temperature 35.9 ??C (96.6 ??F), temperature source Temporal, resp. rate 16, height 167.6 cm (5' 6), weight 100.7 kg (221 lb 14.4 oz), SpO2 99 %, not currently breastfeeding.  General Appearance:  No acute distress, well appearing and well nourished.   Head:  Normocephalic, atraumatic.   Eyes:  Conjuctiva and lids appear normal. Pupils equal and round,   sclera anicteric.   Ears:  Overall appearance normal with no scars, lesions or               masses.  Hearing is grossly normal.   Neck: Supple, symmetrical, trachea midline, no adenopathy;   thyroid: no enlargement/tenderness/nodules.   Pulmonary:    Normal respiratory effort.    Musculoskeletal: Normal gait. Extremities without clubbing, cyanosis, or           edema.   Skin: Skin color, texture, turgor normal, no rashes or lesions.   Neurologic: No motor abnormalities noted. Sensation grossly intact.   Lymphatic:  Breast: No cervical or supraclavicular LAD noted.   A comprehensive examination of the breasts and chest wall was performed with  the patient upright and supine with arms at her sides and above her head.   Right breast is surgically absent with well healed mastectomy scar. The native nipple is surgically absent. The breast has been reconstructed with implant based reconstruction.  No palpable nodules, skin lesions, erythema, or areas of concern. . infraclavicular and supraclavicular nodal examinations are negative. Arm circumference is increased consistent with lymphedema. Cosmetic outcome is excellent.  Left breast is surgically absent with well healed mastectomy scar. The native nipple is surgically absent. The breast has been reconstructed with implant based reconstruction.  No palpable nodules, skin lesions, erythema, or areas of concern. Marland Kitchen   Psychiatric: Judgement and insight appropriate. Oriented to person,         place, and time.       Imaging Review:  None indicated due to mastectomies.     I have personally reviewed the breast imaging, laboratory values, existing medical records, and pathology. I have summarized these findings and my interpretation in the oncology history and assessment above.    Impression:  Ms. Braziel is a 31 y.o. female with a history of right breast infiltrating ductal carcinoma, estrogen receptor positive, progesterone receptor positive and HER2 negative. See treatment summary above.   Staging   Cancer Staging   Malignant neoplasm of right breast in female, estrogen receptor positive (CMS-HCC)  Staging form: Breast, AJCC 8th Edition  - Clinical: Stage IB (cT1c, cN1, cM0, G1, ER+, PR+, HER2-) - Signed by [name], MD on 12/02/2019  - Pathologic stage from 04/20/2020: No Stage Recommended (ypT1c, pN1a, cM0, G2, ER+, PR+, HER2-) - Signed by [name], MD on 04/25/2020    There is no evidence of disease at 1 year.    Recommendations:  1. Continue current therapy as prescribed by medical oncology.   2. Discussed importance of daily stretching despite current normal ROM.  3. Continue compression and pump for management of lymphedema.  4. Continue Vit E for hot flashes. Denies the need for additional intervention  5. Follow-up in one year for exam. Sooner for any new breast complaints.     All of the patient's questions and concerns were addressed during the visit and she is in agreement with the plan of care. Please do not hesitate to contact me with questions or concerns.     Cancer Staging   Malignant neoplasm of right breast in female, estrogen receptor positive (CMS-HCC)  Staging form: Breast, AJCC 8th Edition  - Clinical: Stage IB (cT1c, cN1, cM0, G1, ER+, PR+, HER2-) - Signed by [name], MD on 12/02/2019  - Pathologic stage from 04/20/2020: No Stage Recommended (ypT1c, pN1a, cM0, G2, ER+, PR+, HER2-) - Signed by [name], MD on 04/25/2020      Pollyann Samples, NP-C  Surgical Breast Oncology  06/20/2021  8:22 AM

## 2021-06-24 ENCOUNTER — Telehealth: Admit: 2021-06-24 | Discharge: 2021-06-25 | Payer: PRIVATE HEALTH INSURANCE | Attending: Clinical | Primary: Clinical

## 2021-06-25 NOTE — Unmapped (Signed)
[facility]  Comprehensive Cancer Support Program   Telehealth Encounter    *non billable encounter*      Encounter Description: This encounter was conducted via telephone in the setting of State of Emergency due to COVID-19 Pandemic.     The patient reports they are currently: not at home. I spent 40 minutes on the phone with the patient on the date of service. I spent an additional 15 minutes on pre- and post-visit activities on the date of service.     The patient was physically located in [place] or a state in which I am permitted to provide care. The patient and/or parent/guardian understood that s/he may incur co-pays and cost sharing, and agreed to the telemedicine visit. The visit was reasonable and appropriate under the circumstances given the patient's presentation at the time.    The patient and/or parent/guardian has been advised of the potential risks and limitations of this mode of treatment (including, but not limited to, the absence of in-person examination) and has agreed to be treated using telemedicine. The patient's/patient's family's questions regarding telemedicine have been answered.     If the visit was completed in an ambulatory setting, the patient and/or parent/guardian has also been advised to contact their provider???s office for worsening conditions, and seek emergency medical treatment and/or call 911 if the patient deems either necessary.      Assessment:  Tami Gibson is a 31 y.o. female with a history of breast cancer. Referred by Luetta Nutting, PT for support navigating survivorship and long term impact of treatment (lymphedema). She describes a normal and healthy emotional response to treatment and is interested in engaging in therapy as she transitions from active treatment to survivorship.     Risk Assessment:  A suicide and violence risk assessment was performed as part of this evaluation. There is no acute risk for suicide or violence at this time.  While future psychiatric events cannot be accurately predicted, the patient does not currently require acute inpatient psychiatric care and does not currently meet [facility] involuntary commitment criteria.       Plan:  Will continue to follow for supportive counseling.      Subjective:   Patient interviewed in a private place, accompanied by no one. Shared updates since our last visit. Overall is doing well. Appropriately anxious about surgery in March. Doesn't have much PTO, which makes it a financial stressor as well. Husband able to take FMLA though to be home. Looking forward to having this chapter completed. Also planning to attend young survivors conference in February. Provided supportive counseling, active listening, normalization, and psychoeducation. Follow-up scheduled for January at pt request.     Objective:    Mental Status Exam:  Speech/Language:    Normal rate, volume, tone, fluency   Mood:   euthymic   Thought process and Associations:   Logical, linear, clear, coherent, goal directed   Abnormal/psychotic thought content:     Denies SI, HI, self harm, delusions, obsessions, paranoid ideation, or ideas of reference   Perceptual disturbances:     Does not endorse auditory or visual hallucinations     Orientation:   Oriented to person, place, time, and general circumstances   Insight:     Intact   Judgment:    Intact   Impulse Control:   Intact     [name], LCSW  Comprehensive Cancer Support Program  Phone: [phone]  Pager: [phone]

## 2021-07-04 NOTE — Unmapped (Signed)
[facility] Refill Coordination Note    Specialty Medication(s) to be Shipped:   Hematology/Oncology: Verzenio 100mg     Other medication(s) to be shipped: No additional medications requested for fill at this time     Tami Gibson, DOB: 20-Sep-1990  Phone: [phone] (home)       All above HIPAA information was verified with patient.     Was a Nurse, learning disability used for this call? No    Completed refill call assessment today to schedule patient's medication shipment from the [facility] [phone]).  All relevant notes have been reviewed.     Specialty medication(s) and dose(s) confirmed: Regimen is correct and unchanged.   Changes to medications: Zophia reports no changes at this time.  Changes to insurance: No  New side effects reported not previously addressed with a pharmacist or physician: None reported  Questions for the pharmacist: No    Confirmed patient received a Conservation officer, historic buildings and a Surveyor, mining with first shipment. The patient will receive a drug information handout for each medication shipped and additional FDA Medication Guides as required.       DISEASE/MEDICATION-SPECIFIC INFORMATION        N/A    SPECIALTY MEDICATION ADHERENCE     Medication Adherence    Patient reported X missed doses in the last month: 0  Specialty Medication: Verzenio 100mg   Patient is on additional specialty medications: No  Informant: patient              Were doses missed due to medication being on hold? No    Verzenio 100 mg: 10 days of medicine on hand     REFERRAL TO PHARMACIST     Referral to the pharmacist: Not needed      [facility]     Shipping address confirmed in Epic.     Delivery Scheduled: Yes, Expected medication delivery date: 07/10/21.     Medication will be delivered via Next Day Courier to the prescription address in Epic WAM.    [name]   [facility] Specialty Technician

## 2021-07-07 DIAGNOSIS — Z170 Estrogen receptor positive status [ER+]: Principal | ICD-10-CM

## 2021-07-07 DIAGNOSIS — C50911 Malignant neoplasm of unspecified site of right female breast: Principal | ICD-10-CM

## 2021-07-08 ENCOUNTER — Institutional Professional Consult (permissible substitution): Admit: 2021-07-08 | Discharge: 2021-07-08 | Payer: PRIVATE HEALTH INSURANCE

## 2021-07-08 DIAGNOSIS — C50911 Malignant neoplasm of unspecified site of right female breast: Principal | ICD-10-CM

## 2021-07-08 DIAGNOSIS — Z170 Estrogen receptor positive status [ER+]: Principal | ICD-10-CM

## 2021-07-08 MED ADMIN — leuprolide (LUPRON) injection 3.75 mg: 3.75 mg | INTRAMUSCULAR | @ 14:00:00 | Stop: 2021-07-08

## 2021-07-08 MED ADMIN — leuprolide (LUPRON) 3.75 mg injection: INTRAMUSCULAR | @ 14:00:00 | Stop: 2021-07-08

## 2021-07-08 NOTE — Unmapped (Signed)
Patient received lupron in Right Dorsogluteal Upper Outer Quadrant at patient request.. Tolerated well. BandAid Applied.

## 2021-07-09 MED FILL — VERZENIO 100 MG TABLET: ORAL | 28 days supply | Qty: 56 | Fill #2

## 2021-07-21 MED ORDER — OMEPRAZOLE 20 MG CAPSULE,DELAYED RELEASE
ORAL_CAPSULE | 0 refills | 0 days
Start: 2021-07-21 — End: ?

## 2021-07-22 DIAGNOSIS — Z170 Estrogen receptor positive status [ER+]: Principal | ICD-10-CM

## 2021-07-22 DIAGNOSIS — C50911 Malignant neoplasm of unspecified site of right female breast: Principal | ICD-10-CM

## 2021-07-22 DIAGNOSIS — I890 Lymphedema, not elsewhere classified: Principal | ICD-10-CM

## 2021-07-22 NOTE — Unmapped (Signed)
Patient need refill if appropriate.     Most recent clinic visit: 03/13/2021  Next clinic visit:09/18/2021

## 2021-07-23 MED ORDER — OMEPRAZOLE 20 MG CAPSULE,DELAYED RELEASE
ORAL_CAPSULE | 0 refills | 0 days | Status: CP
Start: 2021-07-23 — End: ?

## 2021-07-28 NOTE — Unmapped (Signed)
CC: Pre-op    HPI:  Tami Gibson is a 31 y.o. 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 Teola Bradley) and immediate reconstruction with tissue expander placement on 04/20/20. Patients final pathology revealed residual disease with 2/3 positive lymph nodes and she underwent ALND with Dr. Teola Bradley on 05/07/20. She is on letrozole. She underwent adjuvant radiation (completed 08/06/20). She has been fully expanded 600/600 as of 09/11/20. She is scheduled for bilateral DIEP flaps on 08/13/2021. She was struggling with lymphedema at her last visit and saw Dr. Marita Kansas regarding this who felt that she was a good candidate for lymphovenous bypass should it be needed in the future. Patient presents today to further discuss possible DIEP reconstruction.    Past Medical History:  Past Medical History:   Diagnosis Date   ??? Acne     started during teen yrs., Accutane 2008-2009 during college yrs   ??? Anemia     During teen years only, no anemia recently   ??? 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 Gestational hypertension 01/30/2017    end of pregnancy had hypertension, was induced   ??? Headache     migraine occasionally   ??? Hypertension     during week 37 week, pt induced and had C/S due to hypertension   ??? 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 Kidney stone    ??? Malignant neoplasm of right breast in female, 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/30/2019   ??? Pyelonephritis 09/2016    Kidney stones   ??? Urinary tract infection     Had 5-6 in Lifetime, esp high school and college age   ??? Varicella     during childhood   ??? Vitamin B 12 deficiency        Past Surgical History:  Past Surgical History:   Procedure Laterality Date   ??? BREAST BIOPSY Right     IDC   ??? CESAREAN SECTION  01/30/2017    breech   ??? CHEMOTHERAPY     ??? IR INSERT PORT AGE GREATER THAN 5 YRS  12/08/2019    IR INSERT PORT AGE GREATER THAN 5 YRS 12/08/2019 Jobe Gibbon, MD IMG VIR H&V [facility]   ??? LUMBAR PUNCTURE DIAGNOSTIC [facility] HISTORICAL RESULT)  01/2015    to relieve ICP   ??? PR BX/REMV,LYMPH NODE,DEEP AXILL Right 04/20/2020    Procedure: BX/EXC LYMPH NODE; OPEN, DEEP AXILRY NODE;  Surgeon: Moss Mc, MD;  Location: MAIN OR [facility];  Service: Surgical Oncology Breast   ??? PR CESAREAN DELIVERY ONLY N/A 01/30/2017    Procedure: CESAREAN DELIVERY ONLY;  Surgeon: Asher Muir, MD;  Location: L&D C-SECTION OR SUITES [facility];  Service: Maternal-Fetal Medicine   ??? PR CESAREAN DELIVERY ONLY N/A 02/17/2019    Procedure: CESAREAN DELIVERY ONLY;  Surgeon: Lonny Prude, MD;  Location: L&D C-SECTION OR SUITES [facility];  Service: Maternal-Fetal Medicine   ??? PR IMPLNT BIO IMPLNT FOR SOFT TISSUE REINFORCEMENT Bilateral 04/20/2020    Procedure: IMPLANTATION BIOLOGIC IMPLANT(EG, ACELLULAR DERMAL MATRIX) FOR SOFT TISSUE REINFORCEMENT(EG, BREAST, TRUNK);  Surgeon: Arsenio Katz, MD;  Location: MAIN OR [facility];  Service: Plastics   ??? PR INTRAOPERATIVE SENTINEL LYMPH NODE ID W DYE INJECTION Right 04/20/2020    Procedure: INTRAOPERATIVE IDENTIFICATION SENTINEL LYMPH NODE(S) INCLUDE INJECTION NON-RADIOACTIVE DYE, WHEN PERFORMED;  Surgeon: Moss Mc, MD;  Location: MAIN OR [place];  Service: Surgical Oncology Breast   ??? PR INTRAOPERATIVE SENTINEL LYMPH NODE ID W DYE INJECTION Right 05/07/2020    Procedure: INTRAOPERATIVE IDENTIFICATION SENTINEL LYMPH NODE(S) INCLUDE INJECTION NON-RADIOACTIVE DYE, WHEN PERFORMED;  Surgeon: Moss Mc, MD;  Location: MAIN OR [place];  Service: Surgical Oncology Breast   ??? PR MASTECTOMY, SIMPLE, COMPLETE Bilateral 04/20/2020    Procedure: MASTECTOMY, SIMPLE, COMPLETE;  Surgeon: Moss Mc, MD;  Location: MAIN OR [place];  Service: Surgical Oncology Breast   ??? PR REMOVE ARMPITS LYMPH NODES COMPLT Right 05/07/2020    Procedure: AXILLARY LYMPHADENECTOMY; COMPLETE;  Surgeon: Moss Mc, MD;  Location: MAIN OR [place];  Service: Surgical Oncology Breast   ??? PR TISSUE EXPANDER PLACEMENT BREAST RECONSTRUCTION Bilateral 04/20/2020    Procedure: TISSUE EXPANDER PLACEMENT IN BREAST RECONSTRUCTION, INCLUDING SUBSEQUENT EXPANSION(S);  Surgeon: Arsenio Katz, MD;  Location: MAIN OR [facility];  Service: Plastics   ??? TONSILECTOMY, ADENOIDECTOMY, BILATERAL MYRINGOTOMY AND TUBES     ??? WISDOM TOOTH EXTRACTION  2004       Medications:  Current Outpatient Medications   Medication Sig Dispense Refill   ??? abemaciclib (VERZENIO) 100 mg Tab tablet Take 1 tablet (100 mg total) by mouth Two (2) times a day. Take with or without food. 56 tablet 5   ??? acetaminophen (TYLENOL) 325 MG tablet Take 650 mg by mouth every six (6) hours as needed for pain.     ??? calcium carbonate (TUMS) 200 mg calcium (500 mg) chewable tablet Chew 1 tablet daily.     ??? docusate sodium (COLACE) 100 MG capsule Take 100 mg by mouth nightly as needed (PRN). (Patient not taking: Reported on 06/10/2021)     ??? ergocalciferol-1,250 mcg, 50,000 unit, (DRISDOL) 1,250 mcg (50,000 unit) capsule Take 1,250 mcg by mouth once a week.     ??? escitalopram oxalate (LEXAPRO) 10 MG tablet TAKE 1 TABLET BY MOUTH IN THE MORNING 90 tablet 3   ??? fexofenadine (ALLEGRA) 180 MG tablet Take 180 mg by mouth.     ??? letrozole (FEMARA) 2.5 mg tablet Take 1 tablet (2.5 mg total) by mouth daily. 90 tablet 3   ??? omeprazole (PRILOSEC) 20 MG capsule Take 1 capsule by mouth once daily 90 capsule 0   ??? ondansetron (ZOFRAN-ODT) 4 MG disintegrating tablet Take 1 tablet (4 mg total) by mouth every eight (8) hours as needed for nausea. 30 tablet 3   ??? rimegepant (NURTEC ODT) 75 mg TbDL Take 75 mg by mouth.     ??? SUMAtriptan (IMITREX) 100 MG tablet TAKE ONE TABLET BY MOUTH AT ONSET OF MIGRAINE FOR UP TO 1 DOSE . IF SYMPTOMS PERSIST A SECOND DOSE MAY BE TAKEN IN 2 HOURS IF NEEDED     ??? topiramate (TOPAMAX) 25 MG tablet TAKE 3 TABLETS BY MOUTH ONCE DAILY     ??? topiramate (TOPAMAX) 50 MG tablet Take 25 mg by mouth Two (2) times a day.     ??? vitamin E-134 mg, 200 UNIT, 134 mg (200 UNIT) capsule Take 134 mg by mouth daily.       No current facility-administered medications for this visit.     Facility-Administered Medications Ordered in Other Visits   Medication Dose Route Frequency Provider Last Rate Last Admin   ??? leuprolide (LUPRON) 3.75 mg injection                Allergies:  No Known Allergies    Family History:   Family History  Problem Relation Age of Onset   ??? Cancer Maternal Aunt 45        breast Ca   ??? Hypertension Father    ??? Other Father 3        Benign brain tumor   ??? Hypothyroidism Mother    ??? Cancer Paternal Aunt         Breast Ca dx in 25s   ??? ALS Maternal Aunt    ??? Ovarian cancer Maternal Grandmother 37   ??? No Known Problems Maternal Grandfather    ??? Cancer Paternal Grandmother         Unknown Cancer   ??? No Known Problems Paternal Grandfather    ??? No Known Problems Maternal Aunt    ??? No Known Problems Maternal Aunt    ??? Breast cancer Maternal Cousin         Dx in 3s   ??? No Known Problems Paternal Aunt    ??? No Known Problems Daughter    ??? No Known Problems Daughter    ??? No Known Problems Other    ??? BRCA 1/2 Neg Hx    ??? Colon cancer Neg Hx    ??? Endometrial cancer Neg Hx     The patient reports no family history for bleeding disorders or anesthetic problems.    Social History:  Social History     Socioeconomic History   ??? Marital status: Married     Spouse name: Dylan   ??? Number of children: 0   Occupational History   ??? Occupation: Recreational therapist    Tobacco Use   ??? Smoking status: Never   ??? Smokeless tobacco: Never   Vaping Use   ??? Vaping Use: Never used   Substance and Sexual Activity   ??? Alcohol use: Not Currently   ??? Drug use: No   ??? Sexual activity: Yes     Partners: Male     Birth control/protection: Pill   Other Topics Concern   ??? Exercise Yes   ??? Living Situation No   Social History Narrative    PRECONCEPTUAL ASSESSMENT: Juvia is a 31 y.o. Caucasian female    Caffeine:denies use    Cats: no    Exercise: not active    Diet: balanced    Family hx of of birth defects, chromosomal abnormality, intellectual disability, developmental delay: no.            PARTNER HISTORY:     Domingo Dimes is a 26 y.o.  Caucasian female.    Occupation:  Immunologist; [employer]    He has fathered children:  no    He is healthy with no chronic illness    Family history of  birth defects, chromosomal abnormality, intellectual disability, or developmental delay: no.                ROS:   Otherwise, 12 point review of systems was completed and is negative except as per HPI.      Vitals:   There were no vitals filed for this visit.     Gen: AA in NAD  HEENT: NCAT, EOMI, PERRL, Non icteric sclerae, MMM  CV: RRR  RESP: quiet respirations on room air  EXT: warm and well perfused, compression garment to RUE  BREASTS: bilateral breasts surgically absent.  NAC surgically absent bilaterally.  Horizontally oriented incisions well healed without keloid/hypertrophic scarring. Expander in appropriate position.  Mild blunting of inferior poles of bilateral breasts.    Impression and Plan:  Calyn Sivils is here today for pre-op before DIEP flap reconstruction. We addressed her concerns regarding DIEP flap surgery. We discussed the risks of surgery including infection, hematoma, seroma, flap necrosis, bulge, hernia, delayed wound healing revision. We discussed postoperative course including ICU stay, use of drains, compression garments, activity restrictions. We discussed that given her current BMI of 37, she is not a candidate for DIEP. Patient acknowledged understanding. We will call her on Thursday to discuss next steps.  -We discussed either a TE to implant exchange with permanent implants and fat grafting, stacked flaps (not performed at [facility]), or DIEP recon (with possible need for implant or fat grafting for additional volume); we discussed that her BMI is not at the range needed to perform autologous reconstruction (35 is goal due to complications at higher BMI).  We discussed that her present abdominal tissue as is is not sufficient to give her the volume of her present expander volume.  If she would be comfortable with a smaller breast and possible need for grafting (not likely to provide volume necessary without many operative trips) or implant than the DIEP would be reasonable.    - currently not a candidate for DIEP  - will call patient on Thursday to discuss next steps       No follow-ups on file.

## 2021-07-29 ENCOUNTER — Ambulatory Visit: Admit: 2021-07-29 | Discharge: 2021-07-30 | Payer: PRIVATE HEALTH INSURANCE

## 2021-07-29 DIAGNOSIS — Z9889 Other specified postprocedural states: Principal | ICD-10-CM

## 2021-07-29 NOTE — Unmapped (Signed)
Radiation Oncology Follow Up Visit Note    Patient Name: Tami Gibson  Patient Age: 31 y.o.  Encounter Date: 08/01/2021      Diagnoses:  Breast cancer    Assessment:  31 year old woman with cT1N1 IDC of the right breast (ER/PR pos, HER2 neg) s/p neoadjuvant ddACT followed by right simple mastectomy (immediate recon) with TAD-- > ALND with residual 1.4cm of disesae, RCB-III, excised with negative margins, and 3/19 total nodes, largest 9mm with ECE present (ypT1cN1a). She is now s/p adjuvant PMRT to the right chest wall and nodes 50Gy/24fx completed on 08/06/2020.    Interval Since Completion of Treatment:  12 months    Disease Status: No evidence of disease: 07/29/21    Toxicity/Adverse Effects: Lymphedema:  Grade 2    Description:  Well controlled with compression sleeve, PT, and pump      Care Plan: Continue with current therapy: lupron, letrozole, abemaciclib    Plan:  Breast cancer  B/L mastectomy no MMG  Dr Laney Pastor in April  Seeing Dr Donata Duff in June- no current plan for lymph surgery but is felt to be a good candidate for lymphovenous bypass, if needed.   Dr Baird Lyons in September  Surg onc in one year- due 12/23  Continue Abema, Letrozole    Side effects  Vitamin E for hot flashes working well.   Pt will speak with Dr Lafayette Dragon today in regard to TE change off to permanent implant        Interval History:  [name] returns today for a regularly scheduled follow-up.     She notes she is overall well but would like to have further clarification in regard to breast surgery.  She will likely undergo permanent implant exchange but she does have some concerns that she wishes to address first    She feels she is doing well with glove and lymphedema sleeve but recently developed truncal edema and was referred back to Val who she is working with.      She also notes she went to a young adult cancer symposium and connected with several local women.       Abema started 5/22.  Abema was DR to 100 mg bid with significant relief of diarrhea.         Past Medical, Surgical, Family and Social Histories reviewed and updated in the electronic medical record.  Oncology History Overview Note   31 year old female with recently self palpated right breast lump. A targeted right breast ultrasound on 11/15/2019 showed an irregular mass with spiculated margins at the 7:00 position, 6 cm from the nipple, measuring 1.4 x 1.2 x 1.0 cm. There were 2 abnormal level 1 axillary lymph nodes measuring 1.4 x 1.3 cm and 1.2 x 0.9 cm. Additionally, there was a high level 1/level 2 axillary lymph node, measuring 2.1 x 1.3 cm with an effaced fatty hilum.      Malignant neoplasm of right breast in female, estrogen receptor positive (CMS-HCC)   11/09/2019 Initial Diagnosis    Malignant neoplasm of right breast in female, estrogen receptor positive (CMS-HCC)     11/15/2019 Biopsy    Invasive ductal carcinoma  - Nottingham combined histologic grade: 1  ER 91-100%, PR 91-100%, HER2 negative by IHC    Lymph node, right axilla, core biopsy  - Lymph node positive for metastatic carcinoma,      12/02/2019 -  Cancer Staged    Staging form: Breast, AJCC 8th Edition  - Clinical: Stage IB (  cT1c, cN1, cM0, G1, ER+, PR+, HER2-) - Signed by [name], MD on 12/02/2019       12/09/2019 - 03/22/2020 Chemotherapy    OP BREAST AC (Dose Dense) q2W X 4 CYCLES, THEN PACLITAXEL (DOSE DENSE) Q2W x 4 CYCLES  DOXOrubicin 60 mg/m2 IV on day 1, cyclophosphamide 600 mg/m2 IV on day 1, every 2 weeks for 4 cycles, then PACLitaxel 175 mg/m2 every 2 weeks for 4 cycles      04/20/2020 Surgery    Breast, right, simple mastectomy  -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 Extracapsular extension is identified      Breast, left, simple mastectomy  -Benign breast tissue with no atypia, in situ or invasive carcinoma identified     04/20/2020 - Cancer Staged    Staging form: Breast, AJCC 8th Edition  - Pathologic stage from 04/20/2020: No Stage Recommended (ypT1c, pN1a, cM0, G2, ER+, PR+, HER2-) - Signed by [name], MD on 04/25/2020       05/07/2020 Surgery    Lymph nodes, right axillary, dissection  -One of sixteen lymph nodes involved by metastatic adenocarcinoma (1/16)  -Metastatic focus measures 3 mm in greatest dimension  -No extracapsular extension is identified     06/28/2020 - 08/06/2020 Radiation    The total radiation dose will be 5000 cGy at 200 cGy/fraction for a total of 25 fractions, treated once a day     08/13/2020 Endocrine/Hormone Therapy    Letrozole 2.5mg  +Ovarian suppression (monthly lupron)     Malignant neoplasm of right breast in female, estrogen receptor positive (CMS-HCC)   05/07/2020 Surgery    Lymph nodes, right axillary, dissection  -One of sixteen lymph nodes involved by metastatic adenocarcinoma (1/16)  -Metastatic focus measures 3 mm in greatest dimension  -No extracapsular extension is identified     06/13/2020 -  Radiation    Radiation Therapy Treatment Details (Noted on 06/13/2020)  Site: Right Breast - Overlapping sites  Technique: 3D CRT  Goal: No goal specified  Planned Treatment Start Date: No planned start date specified     06/21/2020 -  Radiation    Radiation Therapy Treatment Details (Noted on 06/21/2020)  Site: Right Breast - Overlapping sites  Technique: 3D CRT  Goal: No goal specified  Planned Treatment Start Date: No planned start date specified       ROS:  A 10 systems was negative except for pertinent positives noted in HPI.    Physical Exam:  Vitals:    08/01/21 0825   Temp: 35.4 ??C (95.8 ??F)   BP 120/78- checked same as was elevated at last clinic visit  Karnofsky Performance Status: 90,  Able to carry on normal activity; minor signs or symptoms of disease (ECOG equivalent 0)  General:  No acute distress, alert and oriented X 4   Neuro:  Normal Gait and Cognition  HEENT: Anicteric, mask in place   Neck: Supple  Cardio: S1S2 RRR  Lungs: Clear in all fields  MSK: Independent gait  Psych: Normal mood and affect.  Breast:  Examination in the sitting and recumbent positions finds fair symmetry with R breast mound higher than L.  R breast mound is slight lateral and firm, no concerns.  R breast mound skin is thicker but no hyperpigmentation.  L breast mound soft and no palpable concerns.  Surgical scars intact.  Labs:    No results found for: CA125, CEA, AFPTM, CA199, HCGTM, HE4, PSADIAG    No results found for: WBC, HGB, HCT, PLT, LDH, CREATININE, AST, ALT, MG      The above case was reviewed, patient was seen and examined, and the plan of care was discussed with Dr. Baird Lyons      -I saw and evaluated/examined the patient, participating in the key portions of the service.  I discussed the findings, assessment, and plan of care with the resident/APP. I personally reviewed all relevant data associated with this encounter including imaging, labwork, and prior records. I have edited and agree with the findings and plan as documented in the resident/APP's note.  Dana L. Baird Lyons, MD  Assistant Professor  Department of Radiation Oncology

## 2021-07-30 ENCOUNTER — Ambulatory Visit
Admit: 2021-07-30 | Payer: PRIVATE HEALTH INSURANCE | Attending: Rehabilitative and Restorative Service Providers" | Primary: Rehabilitative and Restorative Service Providers"

## 2021-07-30 ENCOUNTER — Telehealth
Admit: 2021-07-30 | Discharge: 2021-07-31 | Payer: PRIVATE HEALTH INSURANCE | Attending: Internal Medicine | Primary: Internal Medicine

## 2021-07-30 DIAGNOSIS — C50911 Malignant neoplasm of unspecified site of right female breast: Principal | ICD-10-CM

## 2021-07-30 DIAGNOSIS — Z6836 Body mass index (BMI) 36.0-36.9, adult: Principal | ICD-10-CM

## 2021-07-30 DIAGNOSIS — Z170 Estrogen receptor positive status [ER+]: Principal | ICD-10-CM

## 2021-07-30 DIAGNOSIS — E661 Drug-induced obesity: Principal | ICD-10-CM

## 2021-07-30 DIAGNOSIS — F419 Anxiety disorder, unspecified: Principal | ICD-10-CM

## 2021-07-30 MED ORDER — ESCITALOPRAM 20 MG TABLET
ORAL_TABLET | Freq: Every day | ORAL | 3 refills | 90 days | Status: CP
Start: 2021-07-30 — End: ?

## 2021-07-30 MED ORDER — SEMAGLUTIDE 0.25 MG OR 0.5 MG (2 MG/1.5 ML) SUBCUTANEOUS PEN INJECTOR
SUBCUTANEOUS | 3 refills | 56 days | Status: CP
Start: 2021-07-30 — End: ?

## 2021-07-30 MED ORDER — TRAZODONE 50 MG TABLET
ORAL_TABLET | Freq: Every evening | ORAL | 5 refills | 30 days | Status: CP | PRN
Start: 2021-07-30 — End: 2022-01-26

## 2021-07-30 NOTE — Unmapped (Signed)
[facility] THERAPY SERVICES [place]  OUTPATIENT PHYSICAL THERAPY  07/30/2021  Note Type: Evaluation  Note Date Range: 07-30-21 to 10-25-21    Patient Name: Tami Gibson  Date of Birth:1990/12/30  Diagnosis:   Encounter Diagnoses   Name Primary?   ??? Malignant neoplasm of right breast in female, estrogen receptor positive, unspecified site of breast (CMS-HCC)    ??? Lymphedema    ??? Lymphedema syndrome, postmastectomy Yes   ??? Scar condition and fibrosis of skin      Referring MD:  [name], MD     Date of Onset of Impairment-07/09/2021  Date PT Care Plan Established or Reviewed-07/30/2021  Date PT Treatment Started-07/30/2021       Session #:  1 of 11      Contraindications:  none  Precautions:  none  Red Flags:  history of cancer, right breast IB  Past Medical History:  Past Medical History:   Diagnosis Date   ??? Acne     started during teen yrs., Accutane 2008-2009 during college yrs   ??? Anemia     During teen years only, no anemia recently   ??? 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 Gestational hypertension 01/30/2017    end of pregnancy had hypertension, was induced   ??? Headache     migraine occasionally   ??? Hypertension     during week 37 week, pt induced and had C/S due to hypertension   ??? 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 Kidney stone    ??? Malignant neoplasm of right breast in female, 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/30/2019   ??? Pyelonephritis 09/2016    Kidney stones   ??? Urinary tract infection     Had 5-6 in Lifetime, esp high school and college age   ??? Varicella     during childhood   ??? Vitamin B 12 deficiency      Oncology History/Surgical History:  Oncology History Overview Note   31 year old female with recently self palpated right breast lump. A targeted right breast ultrasound on 11/15/2019 showed an irregular mass with spiculated margins at the 7:00 position, 6 cm from the nipple, measuring 1.4 x 1.2 x 1.0 cm. There were 2 abnormal level 1 axillary lymph nodes measuring 1.4 x 1.3 cm and 1.2 x 0.9 cm. Additionally, there was a high level 1/level 2 axillary lymph node, measuring 2.1 x 1.3 cm with an effaced fatty hilum.      Malignant neoplasm of right breast in female, estrogen receptor positive (CMS-HCC)   11/09/2019 Initial Diagnosis    Malignant neoplasm of right breast in female, estrogen receptor positive (CMS-HCC)     11/15/2019 Biopsy    Invasive ductal carcinoma  - Nottingham combined histologic grade: 1  ER 91-100%, PR 91-100%, HER2 negative by IHC    Lymph node, right axilla, core biopsy  - Lymph node positive for metastatic carcinoma,      12/02/2019 -  Cancer Staged    Staging form: Breast, AJCC 8th Edition  - Clinical: Stage IB (cT1c, cN1, cM0, G1, ER+, PR+, HER2-) - Signed by [name], MD on 12/02/2019       12/09/2019 - 03/22/2020 Chemotherapy    OP BREAST AC (Dose Dense) q2W X 4 CYCLES, THEN PACLITAXEL (DOSE DENSE) Q2W x 4 CYCLES  DOXOrubicin 60 mg/m2 IV on day 1, cyclophosphamide 600 mg/m2 IV on day 1, every 2 weeks for  4 cycles, then PACLitaxel 175 mg/m2 every 2 weeks for 4 cycles      04/20/2020 Surgery    Breast, right, simple mastectomy  -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 Extracapsular extension is identified      Breast, left, simple mastectomy  -Benign breast tissue with no atypia, in situ or invasive carcinoma identified     04/20/2020 -  Cancer Staged    Staging form: Breast, AJCC 8th Edition  - Pathologic stage from 04/20/2020: No Stage Recommended (ypT1c, pN1a, cM0, G2, ER+, PR+, HER2-) - Signed by [name], MD on 04/25/2020       05/07/2020 Surgery    Lymph nodes, right axillary, dissection  -One of sixteen lymph nodes involved by metastatic adenocarcinoma (1/16)  -Metastatic focus measures 3 mm in greatest dimension  -No extracapsular extension is identified     06/28/2020 - 08/06/2020 Radiation    The total radiation dose will be 5000 cGy at 200 cGy/fraction for a total of 25 fractions, treated once a day     08/13/2020 Endocrine/Hormone Therapy    Letrozole 2.5mg  +Ovarian suppression (monthly lupron)     Malignant neoplasm of right breast in female, estrogen receptor positive (CMS-HCC)   05/07/2020 Surgery    Lymph nodes, right axillary, dissection  -One of sixteen lymph nodes involved by metastatic adenocarcinoma (1/16)  -Metastatic focus measures 3 mm in greatest dimension  -No extracapsular extension is identified     06/13/2020 -  Radiation    Radiation Therapy Treatment Details (Noted on 06/13/2020)  Site: Right Breast - Overlapping sites  Technique: 3D CRT  Goal: No goal specified  Planned Treatment Start Date: No planned start date specified     06/21/2020 -  Radiation    Radiation Therapy Treatment Details (Noted on 06/21/2020)  Site: Right Breast - Overlapping sites  Technique: 3D CRT  Goal: No goal specified  Planned Treatment Start Date: No planned start date specified       Past Surgical History:   Procedure Laterality Date   ??? BREAST BIOPSY Right     IDC   ??? CESAREAN SECTION  01/30/2017    breech   ??? CHEMOTHERAPY     ??? IR INSERT PORT AGE GREATER THAN 5 YRS  12/08/2019    IR INSERT PORT AGE GREATER THAN 5 YRS 12/08/2019 Jobe Gibbon, MD IMG VIR [facility]   ??? LUMBAR PUNCTURE DIAGNOSTIC [facility] HISTORICAL RESULT)  01/2015    to relieve ICP   ??? PR BX/REMV,LYMPH NODE,DEEP AXILL Right 04/20/2020    Procedure: BX/EXC LYMPH NODE; OPEN, DEEP AXILRY NODE;  Surgeon: Moss Mc, MD;  Location: MAIN OR [facility];  Service: Surgical Oncology Breast   ??? PR CESAREAN DELIVERY ONLY N/A 01/30/2017    Procedure: CESAREAN DELIVERY ONLY;  Surgeon: Asher Muir, MD;  Location: L&D C-SECTION OR SUITES [facility];  Service: Maternal-Fetal Medicine   ??? PR CESAREAN DELIVERY ONLY N/A 02/17/2019    Procedure: CESAREAN DELIVERY ONLY;  Surgeon: Lonny Prude, MD;  Location: L&D C-SECTION OR SUITES [facility];  Service: Maternal-Fetal Medicine   ??? PR IMPLNT BIO IMPLNT FOR SOFT TISSUE REINFORCEMENT Bilateral 04/20/2020    Procedure: IMPLANTATION BIOLOGIC IMPLANT(EG, ACELLULAR DERMAL MATRIX) FOR SOFT TISSUE REINFORCEMENT(EG, BREAST, TRUNK);  Surgeon: Arsenio Katz, MD;  Location: MAIN OR [facility];  Service: Plastics   ??? PR  INTRAOPERATIVE SENTINEL LYMPH NODE ID W DYE INJECTION Right 04/20/2020    Procedure: INTRAOPERATIVE IDENTIFICATION SENTINEL LYMPH NODE(S) INCLUDE INJECTION NON-RADIOACTIVE DYE, WHEN PERFORMED;  Surgeon: Moss Mc, MD;  Location: MAIN OR [place];  Service: Surgical Oncology Breast   ??? PR INTRAOPERATIVE SENTINEL LYMPH NODE ID W DYE INJECTION Right 05/07/2020    Procedure: INTRAOPERATIVE IDENTIFICATION SENTINEL LYMPH NODE(S) INCLUDE INJECTION NON-RADIOACTIVE DYE, WHEN PERFORMED;  Surgeon: Moss Mc, MD;  Location: MAIN OR [place];  Service: Surgical Oncology Breast   ??? PR MASTECTOMY, SIMPLE, COMPLETE Bilateral 04/20/2020    Procedure: MASTECTOMY, SIMPLE, COMPLETE;  Surgeon: Moss Mc, MD;  Location: MAIN OR [place];  Service: Surgical Oncology Breast   ??? PR REMOVE ARMPITS LYMPH NODES COMPLT Right 05/07/2020    Procedure: AXILLARY LYMPHADENECTOMY; COMPLETE;  Surgeon: Moss Mc, MD;  Location: MAIN OR [place];  Service: Surgical Oncology Breast   ??? PR TISSUE EXPANDER PLACEMENT BREAST RECONSTRUCTION Bilateral 04/20/2020    Procedure: TISSUE EXPANDER PLACEMENT IN BREAST RECONSTRUCTION, INCLUDING SUBSEQUENT EXPANSION(S);  Surgeon: Arsenio Katz, MD;  Location: MAIN OR [facility];  Service: Plastics   ??? TONSILECTOMY, ADENOIDECTOMY, BILATERAL MYRINGOTOMY AND TUBES     ??? WISDOM TOOTH EXTRACTION  2004     Metastatic cancer:   no      ASSESSMENT/Reason for Referral:   31 y.o. 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  Pt is s/p right breast cancer with mastectomy and tissue expander, ALND and radiation.  The patient was treated for lymphedema and recently discharged.  She began to note increased swelling in her right arm and trunk in the past 3 weeks.  She is to undergo reconstruction surgery in 2 weeks, and is concerned about her lymphedema worsening.  She has been consistently excellent in self care.   .  In addition to lymphedema , the patient is experiencing these side effects from cancer treatments: Radiation: Fibrosis/myofascial restrictions  Currently the right limb is 6.44 % bigger than the left limb.     Recommendations for treatment/garments:  1) Patient will obtain proper garments to address swelling and improve tissue integrity. Garment recommendations :May need to increase to 30-40 mm Hg RTW sleeve/glove, or go into custom sleeve, either Elvarex or Mediven 550 if swelling does not reduce with treatments   2) Pt will be seen clinically for rehabilitation to include treatments to address as needed: edema/lymphedema, pain, ROM, strength, balance and endurance deficits, as well as learning self care strategies to address these deficits  3) Pt has  a pneumatic compression pump  Patient requires skilled Physical Therapy services  for the following problem list and secondary functional limitations:    Problem List:   Uncontrolled swelling and/or lack of appropriate compression garment    Secondary Functional Limitations:  Uncontrolled swelling can increase chances of  infection, and limit functional ROM     Patient Goals: Decrease swelling    Physical Therapy Goals:   In 1-3 months: Pt and/or caregiver will:  Be able to verbalize proper skin care, lymphedema risk reduction strategies, use and care of compression garments/devices x1  Be able to demonstrate self MLD, donning/doffing garments to manage swelling x1  Decrease limb volume by 1-3 % for proper fit of clothing and to minimize infection risk x 1    In 6-12 months: Pt and/or caregiver will:  Be independent with home management of self care related to lymphedema to reduce infection risk and recurrent hospitalizations  Retain an optimal limb volume that  allows pt to fit into appropriate compression garments and clothing to improve functional use of UE and QOL    Prognosis for goal achievement:   Good due to Previous success.      PLAN:   Within a 90 day certification period, schedule permitting, decreasing frequency as able, patient will participate in the following as needed: Manual lymphatic drainage, compression bandaging or use of reduction kits, appropriate garment recommendations, skin care and therapeutic exercise, manual therapy, low level laser therapy, lymphatouch, taping, ROM and strength training, education on posture and body mechanics,pump/equipment recommendations, balance exercises,endurance exercises, and strategies to assist with sensation deficits and pain reduction.      Planned frequency and duration  of treatment:   1 x week x 10 weeks    Next Visit Plan: MLD, MFR, lymphatouch to right UE/trunk   Pt to continue with current plan of care to educate in self care, precautions, and management of symptoms, optimal edema reduction/maintenance of girth, soft tissue changes, fitting of appropriate compression garments, and progression of exercises to optimize functional ROM, strength, balance and endurance in all ADL's(home, work, recreational, community)    SUBJECTIVE:  Patient???s communication preference: verbal, written, visual, prn   History of Present Illness/ Pt reports:  Swelling began about 3 weeks ago, no injury, elbow feels most swollen     Location of pain: trunk and axilla   Current Pain:  3/10   Max pain:  3/10  Least pain:  0/10  Nature of pain: Intermittent    Cognitive Changes? no   Barriers to learning:    none    Difficulty coping with diagnosis, body image/financial concerns?  Informed patient of CCSP, pt would like referral:no       Home environment:     Home: married, two young children   Pt has caregiver available, capable and willing to help:yes   Social History     Tobacco Use   ??? Smoking status: Never   ??? Smokeless tobacco: Never   Substance Use Topics   ??? Alcohol use: Not Currently       Occupation/ Activities/Recreation:        Occupational History   ??? Occupation: Recreational therapist      Exercise: not assessed     Prior Functional Status:   Independent    Current Functional Limitations: The patient reports the following limitations based on verbal description and/or a FOTO/Quick DASH score of 70 noting these impairments of the upper extremity:  limitations in recreational activities  limitations in regular daily activities due to arm dysfunction    Sexual Dysfunction or Bowel or Bladder  GI changes : not assessed   History of  skin/cellulitis infections?: no  Hospitalizations due to infections?no    Previous Lymphedema Treatment Type:  MLD, day garment, night garment, kinesio taping, lymphatouch and compression pump    Current management:   self MLD, day garment, night garment and Pump    OBJECTIVE:      Posture/Observations:  Forward head, rounded shoulders     Range of Motion/Flexibilty:   UE WFL   Axillary Web Syndrome location: no    Strength/MMT:   UE WFL          Girth Measurements: taken in cm    Date: 2-28 2-28            Right Left RIght Left Right Left RIght Left RIght Left   MCP 18 18           Mid  palm 20 19           Thumb DIP             1st finger DIP             1st finger PIP             2nd finger DIP             2nd finger PIP             3rd finger DIP             3rd finger PIP             4th finger DIP             4th finger PIP                          Axilla             Mid breast nipple line             Ribs below breast             Natural Waistline             Umbilicus             Hips at  cm below umbilicus             Limb Volume                        Location of swelling:  right hand , wrist, forearm, elbow, arm, shoulder, axilla, chest, breast and ribs    Skin condition:  Stemmer???s sign:no.  increased skin thickness  Incision/Scar:   incision healing well postoperatively    Sensation :  Peripheral neuropathy:no  Light touch:   Decreased sensation under axilla, around scar, and posterior lateral upper arm       Gait:WNL  Balance: WNL    Endurance/Fatigue:WNL  DME needs related to safety: none      Total Treatment Time: 60 min      PT Evaluation: 30 min  Moderate  4+ systems evaluated: integumentary, circulatory/lymphatic, neurological, musculoskeletal, functional mobility  Data from the patient???s history and examination indicates the presence of 1-2 personal factors and/or comorbidities that will impact the plan of care for the current problem, including morbid obesity as pt's body habitus can affect manual therapy and response to tx cancer history Examination of body systems includes 1-2   body structures, functions, activity limitations and/or participation restrictions including integumentary system and lymphatic system. The assessed clinical presentation is evolving based on limb volumes and skin texture. These factors result in the need for moderate clinical decision making.    Treatment Rendered:    Patient and/or caregiver and therapist were mask compliant per current [place] COVID mask policy during the entire treatment session.          Manual x 30 min   Myofascial release/Spontaneous Muscle Release Techniques, Lymphatouch to affected areas with simultaneous MFR and MLD to appropriate anastomoses in order to decrease swelling.  Application of compression garment/devices prn to prevent re-accumulation of fluid.        Equipment provided/recommended:   N/A    Communication/consultation with other professionals:  N/A    Referrals made to the following providers:  none    Medical Necessity: This treatment is medically necessary throughout the course of this  patient's life during and after cancer treatments to improve functional activities and /or to minimize the decline of functional abilities and worsening of symptoms,including infection and recurrent hospitalizations, through independent/home management strategies.  Lymphedema, a chronic, progressive condition for which there is no cure, is marked by the accumulation of protein-rich fluid in one or more quadrants of the body due to primary or secondary disruption of the lymphatic system.  The sustained accumulation results in tissue inflammation, an increase in fatty tissue, and development of obstructive connective tissue.  These changes may result in an increased risk of infection, disfigurement and a decrease in mobility and functional performance.   Pt will benefit from physical therapy by a certified lymphedema therapist to address : education in self care, precautions, and management of symptoms, optimal edema reduction/maintenance of girth, optimal soft tissue changes, fitting of appropriate compression garments, and progression of exercises to optimize functional ROM and strength, balance, and endurance in all ADL's(home, work, recreational, community).  Attendance Policy:  I reviewed the no-show/attendance policy with the patient and caregiver(s). The patient/family is aware that they must call to cancel appointments more than 24 hours in advance. They are also aware that if they late cancel or no-show three times, we reserve the right to cancel their remaining appointments. This policy is in place to allow [place] to best serve the needs of our caseload.      I attest that I have reviewed the above information.  Signed: Clementeen Graham, PT   07/30/2021 10:44 AM

## 2021-07-30 NOTE — Unmapped (Signed)
TeleHealth Video Encounter  This medical encounter was conducted virtually using [email or web address]  [email or web address] protocols.    Patient ID: Tami Gibson is a 31 y.o. female who presents by video interaction for evaluation of anxiety    Present on Video Call: patient at home    Assessment/Plan:      1. Anxiety  High anxiety currently due to multiple factors, including upcoming breast reconstruction surgery and change in original plan, combined with work and parenting challenges. Discussed increasing Lexapro to 20mg  daily and adding Trazodone at night as needed for insomnia.   - escitalopram oxalate (LEXAPRO) 20 MG tablet; Take 1 tablet (20 mg total) by mouth daily.  Dispense: 90 tablet; Refill: 3  - traZODone (DESYREL) 50 MG tablet; Take 0.5-1 tablets (25-50 mg total) by mouth nightly as needed for sleep. (for sleep)  Dispense: 30 tablet; Refill: 5    2. Malignant neoplasm of right breast in female, estrogen receptor positive, unspecified site of breast (CMS-HCC)  S/p R mastectomy and prophylactic left mastectomy, chemotherapy. She is now scheduled for breast reconstruction but unfortunately, she doesn't qualify for originally scheduled DIEP flap procedure due to her BMI being above 35. Procedure will be different so she's currently unsure if she'll proceed as scheduled or reschedule surgery and give herself some time to lose weight. Expressed interest in weight loss drug like Ozempic.     3. 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 Will see if Ozempic is covered by insurance. Reviewed risks/benefits/side effects. She's aware that without a diagnosis of diabetes, it may be difficult to get coverage.   - semaglutide (OZEMPIC) 0.25 mg or 0.5 mg(2 mg/1.5 mL) PnIj injection; Inject 0.25 mg under the skin every seven (7) days.  Dispense: 1.5 mL; Refill: 3      Preventive services addressed today  We did not review preventive services today    -- Patient verbalized an understanding of today's assessment and recommendations, as well as the purpose of ongoing medications.    No follow-ups on file.    Medication adherence and barriers to the treatment plan have been addressed. Opportunities to optimize healthy behaviors have been discussed. Patient / caregiver voiced understanding.        Subjective:     HPI  Just saw Dr. Lafayette Dragon in plastics yesterday to discuss her upcoming DIEP flap surgery and due to increasing anxiety due to juggling surgery prep, parenting, work, as well as a possible change in surgical plan, she'd like to discuss changing her medication. She was disappointed to hear that she will not be a candidate for DIEP surgery unless BMI is 35 and there is no way she can lose 15 lbs in the next 2 weeks. Having trouble sleeping and everything feels overwhelming. She'd like to lose weight anyway because chemo and hormonal drugs have made her gain a lot of weight during cancer treatment.    ROS  As per HPI.    The following portions of the patient's history were reviewed and updated as appropriate: allergies, current medications, past medical history, past social history and problem list.          Objective:     As part of this Video Visit, no in-person exam was conducted.  Video interaction permitted the following observations.    General: Well appearing  RESP:  Relaxed respiratory effort.   NEURO: Normal coordination.  No tremors observed.  PSYCH: Alert and oriented.  Speech fluent and sensible.  Calm affect.                The patient reports they are currently: not at home. I spent 20 minutes on the real-time audio and video with the patient on the date of service. I spent an additional 2 minutes on pre- and post-visit activities on the date of service.     The patient was physically located in [place] or a state in which I am permitted to provide care. The patient and/or parent/guardian understood that s/he may incur co-pays and cost sharing, and agreed to the telemedicine visit. The visit was reasonable and appropriate under the circumstances given the patient's presentation at the time.    The patient and/or parent/guardian has been advised of the potential risks and limitations of this mode of treatment (including, but not limited to, the absence of in-person examination) and has agreed to be treated using telemedicine. The patient's/patient's family's questions regarding telemedicine have been answered.     If the visit was completed in an ambulatory setting, the patient and/or parent/guardian has also been advised to contact their provider???s office for worsening conditions, and seek emergency medical treatment and/or call 911 if the patient deems either necessary.

## 2021-07-31 DIAGNOSIS — C50911 Malignant neoplasm of unspecified site of right female breast: Principal | ICD-10-CM

## 2021-07-31 DIAGNOSIS — Z170 Estrogen receptor positive status [ER+]: Principal | ICD-10-CM

## 2021-07-31 NOTE — Unmapped (Signed)
[facility] Clinical Assessment & Refill Coordination Note    Tami Gibson, Tami Gibson: 06-10-1990  Phone: [phone] (home)     All above HIPAA information was verified with patient.     Was a Nurse, learning disability used for this call? No    Specialty Medication(s):   Hematology/Oncology: Verzenio 100mg      Current Outpatient Medications   Medication Sig Dispense Refill   ??? abemaciclib (VERZENIO) 100 mg Tab tablet Take 1 tablet (100 mg total) by mouth Two (2) times a day. Take with or without food. 56 tablet 5   ??? acetaminophen (TYLENOL) 325 MG tablet Take 650 mg by mouth every six (6) hours as needed for pain.     ??? calcium carbonate (OS-CAL) 500 mg calcium (1,250 mg) chewable tablet Chew.     ??? calcium carbonate (TUMS) 200 mg calcium (500 mg) chewable tablet Chew 1 tablet daily.     ??? cholecalciferol, vitamin D3-25 mcg, 1,000 unit,, 25 mcg (1,000 unit) capsule Take 1,000 Units by mouth.     ??? cyanocobalamin (VITAMIN B-12) 100 MCG tablet Take 100 mcg by mouth daily.     ??? docusate sodium (COLACE) 100 MG capsule Take 100 mg by mouth nightly as needed (PRN). (Patient not taking: Reported on 06/10/2021)     ??? ergocalciferol-1,250 mcg, 50,000 unit, (DRISDOL) 1,250 mcg (50,000 unit) capsule Take 1,250 mcg by mouth once a week.     ??? escitalopram oxalate (LEXAPRO) 20 MG tablet Take 1 tablet (20 mg total) by mouth daily. 90 tablet 3   ??? fexofenadine (ALLEGRA) 180 MG tablet Take 180 mg by mouth.     ??? letrozole (FEMARA) 2.5 mg tablet Take 1 tablet (2.5 mg total) by mouth daily. 90 tablet 3   ??? leuprolide (LUPRON) 3.75 mg injection Inject 3.75 mg into the muscle.     ??? omeprazole (PRILOSEC) 20 MG capsule Take 1 capsule by mouth once daily 90 capsule 0   ??? ondansetron (ZOFRAN-ODT) 4 MG disintegrating tablet Take 1 tablet (4 mg total) by mouth every eight (8) hours as needed for nausea. 30 tablet 3   ??? rimegepant (NURTEC ODT) 75 mg TbDL Take 75 mg by mouth.     ??? semaglutide (OZEMPIC) 0.25 mg or 0.5 mg(2 mg/1.5 mL) PnIj injection Inject 0.25 mg under the skin every seven (7) days. 1.5 mL 3   ??? SUMAtriptan (IMITREX) 100 MG tablet TAKE ONE TABLET BY MOUTH AT ONSET OF MIGRAINE FOR UP TO 1 DOSE . IF SYMPTOMS PERSIST A SECOND DOSE MAY BE TAKEN IN 2 HOURS IF NEEDED     ??? topiramate (TOPAMAX) 25 MG tablet TAKE 3 TABLETS BY MOUTH ONCE DAILY     ??? traZODone (DESYREL) 50 MG tablet Take 0.5-1 tablets (25-50 mg total) by mouth nightly as needed for sleep. (for sleep) 30 tablet 5   ??? vitamin E-134 mg, 200 UNIT, 134 mg (200 UNIT) capsule Take 134 mg by mouth daily.       No current facility-administered medications for this visit.     Facility-Administered Medications Ordered in Other Visits   Medication Dose Route Frequency Provider Last Rate Last Admin   ??? leuprolide (LUPRON) 3.75 mg injection                 Changes to medications: [name] reports no changes at this time.    No Known Allergies    Changes to allergies: No    SPECIALTY MEDICATION ADHERENCE     Verzenio 100  mg: 10 days of medicine on hand     Currently on hold until decision about surgery is made    Medication Adherence    Patient reported X missed doses in the last month: 0  Specialty Medication: Verzenio 100mg           Specialty medication(s) dose(s) confirmed: Regimen is correct and unchanged.     Are there any concerns with adherence? No    Adherence counseling provided? Not needed    CLINICAL MANAGEMENT AND INTERVENTION      Clinical Benefit Assessment:    Do you feel the medicine is effective or helping your condition? Yes    Clinical Benefit counseling provided? Not needed    Adverse Effects Assessment:    Are you experiencing any side effects? No    Are you experiencing difficulty administering your medicine? No    Quality of Life Assessment:    Quality of Life      Oncology  1. What impact has your specialty medication had on the reduction of your daily pain or discomfort level?: None  2. On a scale of 1-10, how would you rate your ability to manage side effects associated with your specialty medication? (1=no issues, 10 = unable to take medication due to side effects): 1            How many days over the past month did your condition/medication  keep you from your normal activities? For example, brushing your teeth or getting up in the morning. 0    Have you discussed this with your provider? Not needed    Acute Infection Status:    Acute infections noted within Epic:  No active infections  Patient reported infection: None    Therapy Appropriateness:    Is therapy appropriate and patient progressing towards therapeutic goals? Yes, therapy is appropriate and should be continued    DISEASE/MEDICATION-SPECIFIC INFORMATION      N/A    PATIENT SPECIFIC NEEDS     - Does the patient have any physical, cognitive, or cultural barriers? No    - Is the patient high risk? Yes, patient is taking oral chemotherapy. Appropriateness of therapy as been assessed    - Does the patient require a Care Management Plan? No           SHIPPING     Specialty Medication(s) to be Shipped:   Hematology/Oncology: Verzenio 100mg     Other medication(s) to be shipped: No additional medications requested for fill at this time     Changes to insurance: No    Delivery Scheduled: Yes, Expected medication delivery date: 08/07/21.     Medication will be delivered via Next Day Courier to the confirmed prescription address in [facility].    The patient will receive a drug information handout for each medication shipped and additional FDA Medication Guides as required.  Verified that patient has previously received a Conservation officer, historic buildings and a Surveyor, mining.    The patient or caregiver noted above participated in the development of this care plan and knows that they can request review of or adjustments to the care plan at any time.      All of the patient's questions and concerns have been addressed.    Tami Gibson   [facility] Specialty Pharmacist

## 2021-08-01 ENCOUNTER — Ambulatory Visit: Admit: 2021-08-01 | Payer: PRIVATE HEALTH INSURANCE | Attending: Radiation Oncology | Primary: Radiation Oncology

## 2021-08-01 ENCOUNTER — Ambulatory Visit
Admit: 2021-08-01 | Discharge: 2021-08-02 | Payer: PRIVATE HEALTH INSURANCE | Attending: Radiation Oncology | Primary: Radiation Oncology

## 2021-08-01 NOTE — Unmapped (Signed)
08/01/2021      Subjective/Assessment/Recommendations:    1. Fatigue: No issues  2. Pain: Mild and Controlled  3. Skin: No problems and Intact  4. Lymphedema: Present  5. Prescription Needs: None  6. Psychosocial: No issues  7. Surveillance: N/A  8: Hormones: Yes and Hot flashes      Rx:Rt Sclav: 08/06/2020: 5,000/5,000 cGy  Rx:Rt Chestwa: 08/06/2020: 5,000/5,000 cGy

## 2021-08-04 MED ORDER — LETROZOLE 2.5 MG TABLET
ORAL_TABLET | 0 refills | 0 days
Start: 2021-08-04 — End: ?

## 2021-08-05 MED ORDER — LETROZOLE 2.5 MG TABLET
ORAL_TABLET | 0 refills | 0 days | Status: CP
Start: 2021-08-05 — End: ?

## 2021-08-05 NOTE — Unmapped (Signed)
Patient need refill if appropriate.     Most recent clinic visit: 03/13/2021  Next clinic visit:09/18/2021

## 2021-08-06 MED FILL — VERZENIO 100 MG TABLET: ORAL | 28 days supply | Qty: 56 | Fill #3

## 2021-08-07 ENCOUNTER — Institutional Professional Consult (permissible substitution): Admit: 2021-08-07 | Discharge: 2021-08-08 | Payer: PRIVATE HEALTH INSURANCE

## 2021-08-07 ENCOUNTER — Telehealth: Admit: 2021-08-07 | Discharge: 2021-08-08 | Payer: PRIVATE HEALTH INSURANCE | Attending: Clinical | Primary: Clinical

## 2021-08-07 DIAGNOSIS — C50911 Malignant neoplasm of unspecified site of right female breast: Principal | ICD-10-CM

## 2021-08-07 DIAGNOSIS — Z170 Estrogen receptor positive status [ER+]: Principal | ICD-10-CM

## 2021-08-07 MED ADMIN — leuprolide (LUPRON) injection 3.75 mg: 3.75 mg | INTRAMUSCULAR | @ 14:00:00 | Stop: 2021-08-07

## 2021-08-07 MED ADMIN — leuprolide (LUPRON) 3.75 mg injection: INTRAMUSCULAR | @ 14:00:00 | Stop: 2021-08-07

## 2021-08-07 NOTE — Unmapped (Signed)
6045 Administered Lupron and patient tolerated well. Applied band-aid .

## 2021-08-08 NOTE — Unmapped (Signed)
[facility]  Comprehensive Cancer Support Program   Telehealth Encounter    *non billable encounter*      Encounter Description: This encounter was conducted via telephone in the setting of State of Emergency due to COVID-19 Pandemic.     The patient reports they are currently: not at home. I spent 40 minutes on the phone with the patient on the date of service. I spent an additional 15 minutes on pre- and post-visit activities on the date of service.     The patient was physically located in [place] or a state in which I am permitted to provide care. The patient and/or parent/guardian understood that s/he may incur co-pays and cost sharing, and agreed to the telemedicine visit. The visit was reasonable and appropriate under the circumstances given the patient's presentation at the time.    The patient and/or parent/guardian has been advised of the potential risks and limitations of this mode of treatment (including, but not limited to, the absence of in-person examination) and has agreed to be treated using telemedicine. The patient's/patient's family's questions regarding telemedicine have been answered.     If the visit was completed in an ambulatory setting, the patient and/or parent/guardian has also been advised to contact their provider???s office for worsening conditions, and seek emergency medical treatment and/or call 911 if the patient deems either necessary.      Assessment:  Tami Gibson is a 31 y.o. female with a history of breast cancer. Referred by Luetta Nutting, PT for support navigating survivorship and long term impact of treatment (lymphedema). She describes a normal and healthy emotional response to treatment and is interested in engaging in therapy as she transitions from active treatment to survivorship.     Risk Assessment:  A suicide and violence risk assessment was performed as part of this evaluation. There is no acute risk for suicide or violence at this time.  While future psychiatric events cannot be accurately predicted, the patient does not currently require acute inpatient psychiatric care and does not currently meet [facility] involuntary commitment criteria.       Plan:  Will continue to follow for supportive counseling.      Subjective:   Patient interviewed in a private place, accompanied by no one. Shared updates since our last visit. Recently learned that her reconstructive surgery plan has to be changed. Expressed disappointment, frustration, and loss around this change. Notes desire to be done with this phase in her treatment. Stress has been exacerbated by daughters being sick and husband out of town for work. Implant surgery planned for next Tuesday. With new plan recovery should be easier. Doing well with increased dose of lexapro and trazadone for sleep. Planning to discontinue after surgery. Had a wonderful time at young survivors conference last month. Provided supportive counseling, active listening, normalization, and psychoeducation. Follow-up scheduled.       Objective:    Mental Status Exam:  Speech/Language:    Normal rate, volume, tone, fluency   Mood:  anxious   Thought process and Associations:   Logical, linear, clear, coherent, goal directed   Abnormal/psychotic thought content:     Denies SI, HI, self harm, delusions, obsessions, paranoid ideation, or ideas of reference   Perceptual disturbances:     Does not endorse auditory or visual hallucinations     Orientation:   Oriented to person, place, time, and general circumstances   Insight:     Intact   Judgment:    Intact  Impulse Control:   Intact     [name], LCSW  Comprehensive Cancer Support Program  Phone: [phone]  Pager: [phone]

## 2021-08-13 ENCOUNTER — Encounter
Admit: 2021-08-13 | Discharge: 2021-08-14 | Payer: PRIVATE HEALTH INSURANCE | Attending: Anesthesiology | Primary: Anesthesiology

## 2021-08-13 ENCOUNTER — Ambulatory Visit: Admit: 2021-08-13 | Discharge: 2021-08-14 | Payer: PRIVATE HEALTH INSURANCE

## 2021-08-13 MED ORDER — OXYCODONE 5 MG TABLET
ORAL_TABLET | Freq: Four times a day (QID) | ORAL | 0 refills | 5.00000 days | Status: CP | PRN
Start: 2021-08-13 — End: 2021-08-18
  Filled 2021-08-14: qty 20, 5d supply, fill #0

## 2021-08-13 MED ORDER — SULFAMETHOXAZOLE 800 MG-TRIMETHOPRIM 160 MG TABLET
ORAL_TABLET | Freq: Two times a day (BID) | ORAL | 0 refills | 7.00000 days | Status: CP
Start: 2021-08-13 — End: 2021-08-20
  Filled 2021-08-14: qty 14, 7d supply, fill #0

## 2021-08-13 MED ADMIN — ceFAZolin (ANCEF) injection: @ 19:00:00 | Stop: 2021-08-13

## 2021-08-13 MED ADMIN — propofoL (DIPRIVAN) injection: INTRAVENOUS | @ 20:00:00 | Stop: 2021-08-13

## 2021-08-13 MED ADMIN — fentaNYL (PF) (SUBLIMAZE) injection 25 mcg: 25 ug | INTRAVENOUS | @ 23:00:00 | Stop: 2021-08-13

## 2021-08-13 MED ADMIN — glycopyrrolate (ROBINUL) injection: INTRAVENOUS | @ 20:00:00 | Stop: 2021-08-13

## 2021-08-13 MED ADMIN — haloperidol lactate (HALDOL) injection: INTRAVENOUS | @ 18:00:00 | Stop: 2021-08-13

## 2021-08-13 MED ADMIN — propofoL (DIPRIVAN) injection: INTRAVENOUS | @ 19:00:00 | Stop: 2021-08-13

## 2021-08-13 MED ADMIN — propofoL (DIPRIVAN) injection: INTRAVENOUS | @ 18:00:00 | Stop: 2021-08-13

## 2021-08-13 MED ADMIN — phenylephrine 0.8 mg/10 mL (80 mcg/mL) injection: INTRAVENOUS | @ 21:00:00 | Stop: 2021-08-13

## 2021-08-13 MED ADMIN — BUPivacaine-EPINEPHrine (PF) (MARCAINE-PF w/EPI) 0.25 %-1:200,000 injection (PF): @ 21:00:00 | Stop: 2021-08-13

## 2021-08-13 MED ADMIN — fentaNYL (PF) (SUBLIMAZE) injection 25 mcg: 25 ug | INTRAVENOUS | @ 22:00:00 | Stop: 2021-08-13

## 2021-08-13 MED ADMIN — ondansetron (ZOFRAN) injection: INTRAVENOUS | @ 21:00:00 | Stop: 2021-08-13

## 2021-08-13 MED ADMIN — HYDROmorphone (PF) (DILAUDID) injection: INTRAVENOUS | @ 21:00:00 | Stop: 2021-08-13

## 2021-08-13 MED ADMIN — fentaNYL (PF) (SUBLIMAZE) injection: INTRAVENOUS | @ 19:00:00 | Stop: 2021-08-13

## 2021-08-13 MED ADMIN — HYDROmorphone (PF) (DILAUDID) injection: INTRAVENOUS | @ 20:00:00 | Stop: 2021-08-13

## 2021-08-13 MED ADMIN — sodium chloride irrigation (NS) 0.9 % irrigation solution: @ 19:00:00 | Stop: 2021-08-13

## 2021-08-13 MED ADMIN — EPINEPHrine 1 mg in LR 1000 mL (Plastic Tumescent w/o lidocaine): SUBCUTANEOUS | @ 19:00:00 | Stop: 2021-08-13

## 2021-08-13 MED ADMIN — succinylcholine (ANECTINE) injection: INTRAVENOUS | @ 18:00:00 | Stop: 2021-08-13

## 2021-08-13 MED ADMIN — lactated Ringers infusion: 10 mL/h | INTRAVENOUS | @ 18:00:00 | Stop: 2021-08-13

## 2021-08-13 MED ADMIN — dexamethasone (DECADRON) 4 mg/mL injection: INTRAVENOUS | @ 18:00:00 | Stop: 2021-08-13

## 2021-08-13 MED ADMIN — gentamicin (GARAMYCIN) injection: @ 19:00:00 | Stop: 2021-08-13

## 2021-08-13 MED ADMIN — acetaminophen (TYLENOL) tablet 1,000 mg: 1000 mg | ORAL

## 2021-08-13 MED ADMIN — midazolam (VERSED) injection: INTRAVENOUS | @ 18:00:00 | Stop: 2021-08-13

## 2021-08-13 MED ADMIN — fentaNYL (PF) (SUBLIMAZE) injection: INTRAVENOUS | @ 18:00:00 | Stop: 2021-08-13

## 2021-08-13 MED ADMIN — scopolamine (TRANSDERM-SCOP) 1 mg over 3 days topical patch 1 mg: 1 | TOPICAL | @ 17:00:00 | Stop: 2021-08-13

## 2021-08-13 MED ADMIN — acetaminophen (TYLENOL) tablet 1,000 mg: 1000 mg | ORAL | @ 17:00:00 | Stop: 2021-08-13

## 2021-08-13 MED ADMIN — EPINEPHrine 1 mg in LR 1000 mL (Plastic Tumescent w/o lidocaine): @ 20:00:00 | Stop: 2021-08-13

## 2021-08-13 MED ADMIN — ceFAZolin (ANCEF) IVPB 2 g in 50 ml dextrose (premix): 2 g | INTRAVENOUS | @ 18:00:00 | Stop: 2021-08-13

## 2021-08-13 MED ADMIN — lidocaine (XYLOCAINE) 20 mg/mL (2 %) injection: INTRAVENOUS | @ 18:00:00 | Stop: 2021-08-13

## 2021-08-13 MED ADMIN — glycopyrrolate (ROBINUL) injection: INTRAVENOUS | @ 19:00:00 | Stop: 2021-08-13

## 2021-08-13 MED ADMIN — celecoxib (CeleBREX) capsule 200 mg: 200 mg | ORAL | @ 17:00:00 | Stop: 2021-08-13

## 2021-08-14 MED ADMIN — acetaminophen (TYLENOL) tablet 1,000 mg: 1000 mg | ORAL | @ 08:00:00 | Stop: 2021-08-14

## 2021-08-14 MED ADMIN — oxyCODONE (ROXICODONE) immediate release tablet 10 mg: 10 mg | ORAL | @ 14:00:00 | Stop: 2021-08-14

## 2021-08-14 MED ADMIN — butalbital-acetaminophen-caffeine (ESGIC) per tablet 1 tablet: 1 | ORAL | @ 03:00:00 | Stop: 2021-08-13

## 2021-08-14 MED ADMIN — sulfamethoxazole-trimethoprim (BACTRIM DS) 800-160 mg tablet 160 mg of trimethoprim: 1 | ORAL | @ 03:00:00 | Stop: 2021-08-20

## 2021-08-14 MED ADMIN — sulfamethoxazole-trimethoprim (BACTRIM DS) 800-160 mg tablet 160 mg of trimethoprim: 1 | ORAL | @ 14:00:00 | Stop: 2021-08-14

## 2021-08-14 MED ADMIN — oxyCODONE (ROXICODONE) immediate release tablet 5 mg: 5 mg | ORAL | Stop: 2021-08-27

## 2021-08-14 NOTE — Unmapped (Signed)
Brief Operative Note  (CSN: [id number])      Date of Surgery: 08/13/2021    Pre-op Diagnosis: breast cancer    Post-op Diagnosis: Encounter to discuss breast reconstruction [Z71.89]  Malignant neoplasm of right breast in female, estrogen receptor positive, unspecified site of breast (CMS-HCC) [C50.911, Z17.0]    Procedure(s):  REPLACEMENT OF TISSUE EXPANDER WITH PERMANENT IMPLANT: 11970 (CPT??)  Note: Revisions to procedures should be made in chart - see Procedures activity.    Performing Service: Librarian, academic) and Role:     * Arsenio Katz, MD - Primary     * Floyde Parkins, MD - Resident - Assisting     * Reather Laurence, MD - Resident - Assisting    Assistant: None    Findings: Tissue expander removal and placement of permanent silicone implants. Liposuction from abdomen and fat grafting to bilateral breasts    Anesthesia: General    Estimated Blood Loss: 20 mL    Complications: None    Specimens:   ID Type Source Tests Collected by Time Destination   1 : L breast tissue expander gross only Implant Breast, Left SURGICAL PATHOLOGY EXAM Arsenio Katz, MD 08/13/2021 1443    2 : Right breast tissue expander gross only Implant Breast, Right SURGICAL PATHOLOGY EXAM Arsenio Katz, MD 08/13/2021 1443        Implants:   Implant Name Type Inv. Item Serial No. Manufacturer Lot No. LRB No. Used Action   IMPL COHESIVE SILICONE XFULL - [id number] Breast Implant or Tissue Expander IMPL COHESIVE SILICONE XFULL [id number] [facility]  Right 1 Implanted   IMPL COHESIVE SILICONE XFULL - [id number] Breast Implant or Tissue Expander IMPL COHESIVE SILICONE XFULL [id number] [facility]  Left 1 Implanted       Surgeon Notes: I was present and scrubbed for the entire procedure    Shanda Bumps Danie Diehl   Date: 08/13/2021  Time: 5:20 PM

## 2021-08-14 NOTE — Unmapped (Signed)
Problem: Impaired Wound Healing  Goal: Optimal Wound Healing  Outcome: Discharged to Home     Problem: Adult Inpatient Plan of Care  Goal: Plan of Care Review  Outcome: Discharged to Home  Goal: Patient-Specific Goal (Individualized)  Outcome: Discharged to Home  Goal: Absence of Hospital-Acquired Illness or Injury  Outcome: Discharged to Home  Intervention: Identify and Manage Fall Risk  Recent Flowsheet Documentation  Taken 08/14/2021 1000 by Doristine Section Lakitha Gordy, RN  Safety Interventions:   fall reduction program maintained   low bed   nonskid shoes/slippers when out of bed  Intervention: Prevent Skin Injury  Recent Flowsheet Documentation  Taken 08/14/2021 1000 by Jidenna Figgs A Hideko Esselman, RN  Skin Protection: adhesive use limited  Goal: Optimal Comfort and Wellbeing  Outcome: Discharged to Home  Goal: Readiness for Transition of Care  Outcome: Discharged to Home  Goal: Rounds/Family Conference  Outcome: Discharged to Home   Patient discharged home.Discharge instructions/information given and reviewed with patient,verbalized understanding.Prescription medications delivered to room by [facility] at [facility].PIV removed.Dressing supplies and another surgi bra given to patient All questions answered at time of discharge.

## 2021-08-14 NOTE — Unmapped (Addendum)
Date of Surgery: 08/13/2021  ??  Pre-op Diagnosis: breast cancer with completion of tissue expansion??  Post-op Diagnosis: same  ??  Procedure(s):  Removal of intact bilateral tissue expanders, placement of smooth round silicone breast implants 650 SCX (Right SN [id number], Left SN [id number]); bilateral peribasilar capsulotomies; right inferior and lateral radial capsulotomies; liposuction from abdomen and lateral chest, with fat grafting to bilateral breasts (350 mL)  ??  Performing Service: Librarian, academic) and Role:     * Arsenio Katz, MD - Primary     * Floyde Parkins, MD - Resident - Assisting     * Reather Laurence, MD - Resident - Assisting  ??  Assistant: None  ??  Findings: Tissue expander removal and placement of permanent silicone implants. Liposuction from abdomen and fat grafting to bilateral breasts  ??  Anesthesia: General  ??  Estimated Blood Loss: 20 mL  ??  Complications: None  ??  Specimens:   ID Type Source Tests Collected by Time Destination   1 : L breast tissue expander gross only Implant Breast, Left SURGICAL PATHOLOGY EXAM Arsenio Katz, MD 08/13/2021 1443 ??   2 : Right breast tissue expander gross only Implant Breast, Right SURGICAL PATHOLOGY EXAM Arsenio Katz, MD 08/13/2021 1443 ??   ??  ??  Implants:   Implant Name Type Inv. Item Serial No. Manufacturer Lot No. LRB No. Used Action   IMPL COHESIVE SILICONE XFULL - [id number] Breast Implant or Tissue Expander IMPL COHESIVE SILICONE XFULL [id number] [employer] ?? Right 1 Implanted   IMPL COHESIVE SILICONE XFULL - [id number] Breast Implant or Tissue Expander IMPL COHESIVE SILICONE XFULL [id number] [facility] ?? Left 1 Implanted   ??  Indications: Ms Murley is a 31 y/o F with a history of right breast IDC who underwent bilateral SSM and first stage reconstruction.  Her plan is for an eventual DIEP, however, her BMI remains >35 at this time.  We discussed the need for either delay in the abdominally based free tissue transfer or exchange for permanent implants.  At this time she elected to proceed with exchange, with eventual weight loss and possible delayed autologous reconstruction.      Description of the Procedure: Ms Blanchett was identified in the preoperative holding area, where her??history and physical exam were updated. ??Her sternal notch, midline, and IMF were??marked, as were the areas for fat grafting and liposuction.????She was then transported back to the operating room where??she was positioned supine on the OR table. ??A time out was called and the appropriate patient, procedure, and laterality were confirmed. ??General endotracheal anesthesia was then induced via the anesthesia team. ??Preoperative antibiotics were??assured to be administered. ????All pressure points were assured to be padded.????A foley catheter was placed using standard sterile technique and removed at the conclusion of the case. ??She was assured to be centrally located on the bed, with her shoulders squared off. ??Her arms were extended on arm boards and secured to above the elbows with kerlix rolls. ??The chest and abdomen were??prepped and draped in the usual sterile fashion.????  ??  The??abdominal areas??and lateral chest wall of soft tissue excess had been marked in the preoperative holding area , and this was infiltrated with wetting solution, allowing??20??minutes for the epinephrine to take effect.????  ??  Attention was??first??turned to the left??breast. ??For both breasts, the old scars were marked at the IMF, and infiltrated with local anesthesia. ??The 15 blade scalpel was used to excise the old scars. ??The incision  was stair stepped??up??to the implant capsule, which was opened with the cautery. ??The expanders were removed and passed off the table as gross specimen only.  The volumes were confirmed with a graduated cylinder. ??The bilateral capsules were opened with cautery to allow for the implants to fill the pocket without tethering. ??Sizers were selected,??560 SCF. ??These were placed and the patient positioned upright. ??These were a nice volume, but there was asymmetry due to the radiation on the right.  The 650 SCX sizers were placed for better projection.  This was reasonable.   The right breast border was slightly flattened superiorly, and constricted overall. ??The capsule was scored in this area, focusing on the scarring at the upper outer quadrant.  Additional capsulotomies made on the right. ??The sizers were replaced and the patient sat upright. ??The size volume and contours appeared appropriate. ??At this time the areas of the contour anomalies were identified.  ??  The #4 triport??cannula was used to obtain the fat from??the abdomen, with a #3 triport cannula to obtain the fat from the lateral chest wall, checking to ensure no contour abnormalities with the liposuction. ??The fat was processed with the REVOLVE system per the manufacturers instructions. ??Small aliquots were prepared for injection.????A total of 350??mL of processed fat was obtained.  ??  Attention was turned to bilateral breasts. ??The fat was structurally grafted in small aliquots with a #1 coleman cannula. ??The patient was then positioned upright. ??The symmetry was markedly improved.??  ??  All gloves were exchanged, all instruments were dipped in antibiotic irrigation. ??Using a single surgeon no touch technique, the Keller funnel was used to prepare the same size selected implants. ??The pocket was first irrigated with betadine irrigation, and then one liter per side of antibiotic irrigation. ??The capsule was closed with 3-0 vicryl in a simple running fashion. ??The deep dermal layers were approximated with 3-0 vicryl suture in a simple buried fashion. ??The skin edges were approximated with 4-0 monocryl in a running subcuticular fashion. ??The skin was cleansed, and a dressing of steristrips were then applied. ??  ??  The abdominal liposuction access sites were closed with a simple interrupted 4-0 monocryl suture after excess wetting solution was removed. ??A dressing of abd pads, and an abdominal binder was placed. ??ABDs and a post op bra were placed on the breasts. The patient tolerated the procedure, was extubated and transported to the PACU in stable condition. ??All instrument counts were correct at the conclusion of the case.

## 2021-08-14 NOTE — Unmapped (Signed)
Discharge Summary    Admit date: 08/13/2021    Discharge date and time: 08/14/21    Discharge to:  Home    Discharge Service: [facility])    Discharge Attending Physician: Arsenio Katz, *    Discharge  Diagnoses: breast reconstruction    Secondary Diagnosis: Active Problems:    Malignant neoplasm of right breast in female, estrogen receptor positive (CMS-HCC) POA: Not Applicable    Encounter to discuss breast reconstruction POA: Unknown  Resolved Problems:    * No resolved hospital problems. *      OR Procedures:    Bilateral - REPLACEMENT OF TISSUE EXPANDER WITH PERMANENT IMPLANT  Date  08/13/2021  -------------------     Ancillary Procedures: no procedures    Discharge Day Services:     Subjective   No acute events overnight. Pain Controlled. No fever or chills.    Objective   Patient Vitals for the past 8 hrs:   BP Temp Temp src Pulse Resp SpO2   08/14/21 0414 123/93 36.6 ??C (97.9 ??F) Oral 77 18 97 %   08/14/21 0014 127/77 37 ??C (98.6 ??F) Oral 70 16 93 %     No intake/output data recorded.    General Appearance:   No acute distress  Lungs:                NWOB on RA  Heart:                           Regular rate   Abdomen:                Soft, appropriately tender, incisions c/d/i.   Extremities:              Warm and well perfused  Breasts:  Bilateral reconstructed breasts with implants in place. Expected ecchymosis. No erythema, drainage or undrained fluid collections.       Hospital Course:    Yaretsi Humphres is a 31 y.o. female with history of right breast IDC +/+/- s/p NACT and more recently s/p B SSM with right TAD (Dr. Teola Bradley) and immediate reconstruction with tissue expander placement on 04/20/20. Patients final pathology revealed residual disease with 2/3 positive lymph nodes and she underwent ALND with Dr. Teola Bradley on 05/07/20. She underwent adjuvant radiation (completed 08/06/20). that was admitted to the hospital on 08/13/2021 for scheduled surgery. The patient was taken to the OR on 08/13/2021 for a tissue expander exchange for permanent implants, liposuction from abdomen and fat grafting to bilateral breasts.. She tolerated the procedure well, was extubated in the OR, and was taken to the PACU where she received routine postoperative care before being transferred to the floor. She did well postoperatively. Her diet was slowly advanced and at the time of discharge she was tolerating the expected diet. The patient was able to void spontaneously, have her pain controlled with P.O. pain medication, and ambulate with minimal assistance. She is being discharged on 08/14/21 (POD 1) to home in stable condition with planned outpatient follow-up.  Antibiotic plan at discharge: 7 days bactrim  Drains present at discharge: none      Condition at Discharge: Improved  Discharge Medications:      Medication List      START taking these medications    ??? oxyCODONE 5 MG immediate release tablet; Commonly known as: ROXICODONE;   Take 1 tablet (5 mg total) by mouth every six (6) hours as needed for  pain   for up to 5 days.  ??? sulfamethoxazole-trimethoprim 800-160 mg per tablet; Commonly known as:   BACTRIM DS; Take 1 tablet (160 mg of trimethoprim total) by mouth every   twelve (12) hours for 7 days.     CONTINUE taking these medications    ??? acetaminophen 325 MG tablet; Commonly known as: TYLENOL  ??? calcium carbonate 200 mg calcium (500 mg) chewable tablet; Commonly   known as: TUMS  ??? cholecalciferol (vitamin D3-25 mcg (1,000 unit)) 25 mcg (1,000 unit)   capsule  ??? cyanocobalamin 100 MCG tablet  ??? ergocalciferol-1,250 mcg (50,000 unit) 1,250 mcg (50,000 unit) capsule;   Commonly known as: DRISDOL  ??? escitalopram oxalate 20 MG tablet; Commonly known as: LEXAPRO; Take 1   tablet (20 mg total) by mouth daily.  ??? letrozole 2.5 mg tablet; Commonly known as: FEMARA; Take 1 tablet by   mouth once daily  ??? leuprolide 3.75 mg injection; Commonly known as: LUPRON  ??? omeprazole 20 MG capsule; Commonly known as: PriLOSEC; Take 1 capsule by   mouth once daily  ??? ondansetron 4 MG disintegrating tablet; Commonly known as: ZOFRAN-ODT;   Take 1 tablet (4 mg total) by mouth every eight (8) hours as needed for   nausea.  ??? rimegepant 75 mg Tbdl; Commonly known as: NURTEC ODT  ??? semaglutide 0.25 mg or 0.5 mg(2 mg/1.5 mL) Pnij injection; Commonly   known as: OZEMPIC; Inject 0.25 mg under the skin every seven (7) days.  ??? SUMAtriptan 100 MG tablet; Commonly known as: IMITREX  ??? topiramate 25 MG tablet; Commonly known as: TOPAMAX  ??? traZODone 50 MG tablet; Commonly known as: DESYREL; Take 0.5-1 tablets   (25-50 mg total) by mouth nightly as needed for sleep. (for sleep)  ??? VERZENIO 100 mg Tab tablet; Generic drug: abemaciclib; Take 1 tablet   (100 mg total) by mouth Two (2) times a day. Take with or without food.  ??? vitamin E-134 mg (200 UNIT) 134 mg (200 UNIT) capsule     ASK your doctor about these medications    ??? calcium carbonate 500 mg calcium (1,250 mg) chewable tablet; Commonly   known as: OS-CAL  ??? docusate sodium 100 MG capsule; Commonly known as: COLACE  ??? fexofenadine 180 MG tablet; Commonly known as: ALLEGRA       Pending Test Results: None    Discharge Instructions:    Other Instructions:  Other Instructions     Discharge instructions      [facility]  Discharge Instructions for Epimenio Sarin, M.D.        Procedure performed: tissue expander removal, placement of permanent implants, liposuction from abdomen, fat grafting to bilateral breasts      Medications:    Antibiotics:  - You were prescribed antibiotics after surgery. You should take your antibiotic as prescribed. Do not stop taking your antibiotic early.    Pain and pain medication:  - You may alternate acetaminophen (Tylenol) and ibuprofen (Advil or Motrin), both available over-the-counter for pain relief. Follow the instructions on the bottle. Do not take more than 4 grams (4,000 mg) of acetaminophen/Tylenol per day.  - You have been prescribed a narcotic pain medication. Please take only as directed/prescribed. Do not drive, operate heavy machinery, or make important decisions while taking these medications. Do not drink alcohol or take illegal drugs while on narcotic pain medication. These medications may cause constipation, itching, and nausea.    Stool softeners:  - Prescription narcotic pain  medication, such as oxycodone, Percocet, or Vicodin, can cause constipation. Anaesthesia may also cause constipation. If you become constipated after surgery, you should take polyethylene glycol (Miralax) or sennokot (Senna) to help you have regular bowel movements. Follow the instructions on the bottle.  - If you are constipated after surgery, you should ensure that you have adequate fiber in your diet (>25 grams per day) and drink at least 64oz of water daily. If you become constipated even after using these medications, you may take magnesium citrate. Follow the instructions on the bottle.    Blood thinners:  - You do not need a prescription blood thinner after surgery.    Home medications:  - You should restart your home medications.       Restrictions:    Activity Restrictions:  - Do not exercise, No exercise, no strenuous activity, no yoga. Do not lift any objects heavier than 5 lbs.  - No housework, yardwork, dishwashing, lawn mowing, shovelling, sweeping, Swiffering, cleaning the counters, gardening, dog-walking, swimming, exercising of any kind, yoga, Pilates.  - Walking is encouraged. Do not power-walk, but you may go for gentle walks.    What foods can I eat?:  Normal: You may resume eating normal foods. You do not have any food restrictions.    Showering and Bathing:  - You may shower 48 hours after surgery. When you shower, do not allow water to run down your chest. You must shower with your back facing the water.  - Absolutely no submerging under water. No baths, no hot tubs, no swimming, no pools, no lakes, no rivers, no oceans, etc. Doing so will significantly increase your risk of infection.      Wounds and Dressings:    Surgical Incisions:  - You have absorbable stitches in your incision. These will not need to be removed, and will dissolve on their own.  - You have surgical tape called SteriStrips covering the incision. These will fall off on their own; you may trim them back but do not pull them off.    Wounds and Dressings:  - You have absorbant dressings called ABD pads. You should change these daily, as well as when they become soiled.  - Keep your dressing(s) clean and dry. If you see some light bloody wound seepage through the bandage, do not worry as this is normal. Replace your dressings if needed.  - Continue dressing the site(s) of surgery until your follow up appointment.    Compression Garments:  - You have a surgical compression bra on. You should wear this at all times, day and night, except while showering or changing your dressings.  - You should wear your compression bra until your surgeon instructs you to stop.  - You have a surgical compression binder over your abdomen. You should wear this at all times, day and night, except while showering or changing your dressings. Your surgeon will tell you when to stop wearing your binder at one of your follow-up appointments.      Liposuction and Fat Grafting:  - You have small surgical dressings covering your liposuction incisions. During liposuction, a large amount of fluid is placed into the fatty tissues to help with the procedure. Over the next 1-3 days, a large amount of pinkish or reddish watery fluid may drain from these incisions. This is normal and is not worrisome. You may change these dressings as needed.            When should I call my surgeon?:  Please call if you develop any of the following symptoms:  - Fever, i.e. temperature greater than 101.3??F (or 38.5??C) for adults, or 100.4??F (or 38.0??C) for children  - Persistent nausea or vomiting that does not go away  - Severe pain that cannot be controlled with prescription and over-the-counter pain medication  - Rapidly increasing swelling of at the site of surgery  - Redness, tenderness, foul odor, or thick green or yellow discharge from a surgical site  - Large amounts of blood coming from your incisions or surgical sites  - Any other concerning symptom      APPOINTMENTS / QUESTIONS  For NON-urgent questions regarding appointments or your surgical care, you may reach out to your provider and/or their nursing staff via MyChart. Please keep in mind that it may take up to 2-3 business days for a response.    Additionally, you may contact our clinics, Monday through Friday, 8 am to 4:30 pm    [facility]  [address],   [place] [address]  T: [phone]  F: [phone]    [facility]  [address] [place], [place] [address]  [facility]  T: [phone]  F: [phone]    [facility]  [address], [place], [place] [address]  T: [phone]  F: [phone]    [facility]  [address] [place], [place] [address]  Third Floor, [address]  T: [phone]   F: [phone]        Physicians  Epimenio Sarin MD:   Nurse:  Vista Mink BSN, RN, CPSN [phone]    Surgery Scheduler: Lysle Morales  [phone]    Tarry Kos MD:   Nurse:  [name] BSN, RN, CPSN [phone]   Surgery Scheduler:   Lysle Morales  [phone]    Adeyemi Clydie Braun MD:  Nurse: Stann Ore BSN, RN, [place]  [phone]  Surgery Scheduler: Kristine Royal [phone]    Darleene Cleaver MD:  Nurse: Venetia Maxon BSN, RN, CPSN  [phone]   Surgery Scheduler: Kristine Royal [phone]    Nurse Practitioner:  Ezekiel Slocumb, DNP, NP-C:   Surgery Scheduler: Kristine Royal [phone]    Rush Landmark, PA-C:   Surgery Scheduler: Kristine Royal [phone]       FOR URGENT ISSUES AFTER HOURS/HOLIDAYS:  For urgent issues after-hours (after 5pm, or weekends): call the [facility] operator [phone]) and ask to page the Plastic Surgery resident on call. You will be directed to a surgery resident who likely is not immediately aware of the details of your case, but can help you deal with any emergencies that cannot wait until regular business hours.  Please be aware that this person is responding to many in-hospital emergencies and patient issues and may not answer your phone call immediately, but will return your call as soon as possible.    For emergencies, please call 911 or report to your nearest emergency department.         Follow-Up Appointments:  A follow-up appointment has been requested for 1 week(s) from today. You should receive a call from our schedulers within 1-3 days. If you do not receive a call within 1-3 days, please call the Plastic Surgery clinic at [phone] to set up your appointment.        Labs and Other Follow-ups after Discharge:  Follow Up instructions and Outpatient Referrals     Discharge instructions  Future Appointments:  Appointments which have been scheduled for you    Aug 16, 2021  7:30 AM  (Arrive by 7:15 AM)  PT LYMPHEDEMA TREATMENT with Clementeen Graham, PT  [facility] THERAPY SERVICES [place] [facility] REGION) [address]  [place] [address]  [phone]      Aug 22, 2021 11:30 AM  (Arrive by 11:15 AM)  PT LYMPHEDEMA TREATMENT with Clementeen Graham, PT  [facility] THERAPY SERVICES [place] [facility] REGION) [address]  [place] [address]  [phone]      Aug 26, 2021 11:00 AM  (Arrive by 10:45 AM)  RETURN VIDEO MYCHART with Durenda Hurt, MD  [facility] INTERNAL MEDICINE WEAVER CROSSING [place] [facility] REGION) [address]  [place] [address]  [phone]   Please sign into My [place] Chart at least 15 minutes before your appointment to complete the eCheck-In process. You must complete eCheck-In before you can start your video visit. We also recommend testing your audio and video connection to troubleshoot any issues before your visit begins. Click ???Join Video Visit??? to complete these checks. Once you have completed eCheck-In and tested your audio and video, click ???Join Call??? to connect to your visit.     For your video visit, you will need a computer with a working camera, speaker and microphone, a smartphone, or a tablet with internet access.    My [place] Chart enables you to manage your health, send non-urgent messages to your provider, view your test results, schedule and manage appointments, and request prescription refills securely and conveniently from your computer or mobile device.    You can go to [email or web address] to sign in to your My [place] Chart account with your username and password. If you have forgotten your username or password, please choose the ???Forgot Username???? and/or ???Forgot Password???? links to gain access. You also can access your My [place] Chart account with the free MyChart mobile app for Android or iPhone.    If you need assistance accessing your My [place] Chart account or for assistance in reaching your provider's office to reschedule or cancel your appointment, please call [facility] [phone].       Aug 26, 2021  3:00 PM  (Arrive by 2:30 PM)  RETURN VIDEO MYCHART with Micheline Rough  [facility] CCSP 2ND [facility] CANCER HOSP [place] [facility] REGION) [address]  [place] [address]  [phone]   Please sign into My [place] Chart at least 15 minutes before your appointment to complete the eCheck-In process. You must complete eCheck-In before you can start your video visit. We also recommend testing your audio and video connection to troubleshoot any issues before your visit begins. Click ???Join Video Visit??? to complete these checks. Once you have completed eCheck-In and tested your audio and video, click ???Join Call??? to connect to your visit. For your video visit, you will need a computer with a working camera, speaker and microphone, a smartphone, or a tablet with internet access.    My [place] Chart enables you to manage your health, send non-urgent messages to your provider, view your test results, schedule and manage appointments, and request prescription refills securely and conveniently from your computer or mobile device.    You can go to [email or web address] to sign in to your My  Chart account with your username and password. If you have forgotten your username or password, please choose the ???  Forgot Username???? and/or ???Forgot Password???? links to gain access. You also can access your My [place] Chart account with the free MyChart mobile app for Android or iPhone.    If you need assistance accessing your My [place] Chart account or for assistance in reaching your provider's office to reschedule or cancel your appointment, please call [facility] [phone].       Aug 29, 2021  8:00 AM  (Arrive by 7:45 AM)  POST OP with Arsenio Katz, MD  [facility] PLASTIC AND RECONSTRUCTIVE SURGERY AT [facility] REGION) [address]  [place] [address]  [phone]      Aug 29, 2021 11:30 AM  (Arrive by 11:15 AM)  PT LYMPHEDEMA TREATMENT with Clementeen Graham, PT  [facility] THERAPY SERVICES [place] [facility] REGION) [address]  [place] [address]  [phone]      Sep 04, 2021 10:00 AM  (Arrive by 9:30 AM)  LAB ONLY Joseph with ADULT ONC LAB  [facility] ADULT ONCOLOGY LAB DRAW STATION [place] [facility] REGION) [address]  [place] [address]  [phone]      Sep 04, 2021 11:00 AM  (Arrive by 10:30 AM)  RETURN ACTIVE [place] with [name], MD  [facility] ONCOLOGY MULTIDISCIPLINARY 2ND FLR CANCER HOSP [facility] REGION) [address]  [place] [address]  [phone]      Sep 04, 2021 11:30 AM  (Arrive by 11:00 AM)  Danelle Earthly NURSE with [facility] INJECTION MULTI  [place] ONCOLOGY MULTIDISCIPLINARY 2ND FLR CANCER HOSP [facility] REGION) [address]  [place] [address]  [phone]      Sep 05, 2021 11:30 AM  (Arrive by 11:15 AM)  PT LYMPHEDEMA TREATMENT with Clementeen Graham, PT  [facility] THERAPY SERVICES [place] [facility] REGION) [address]  [place] [address]  [phone]      Sep 12, 2021  8:00 AM  (Arrive by 7:45 AM)  POST OP with Arsenio Katz, MD  [facility] PLASTIC AND RECONSTRUCTIVE SURGERY AT [facility] REGION) [address]  [place] [address]  [phone]      Sep 12, 2021 10:30 AM  (Arrive by 10:15 AM)  PT LYMPHEDEMA TREATMENT with Clementeen Graham, PT  [facility] THERAPY SERVICES [place] [facility] REGION) [address]  [place] [address]  [phone]      Sep 19, 2021 11:30 AM  (Arrive by 11:15 AM)  PT LYMPHEDEMA TREATMENT with Clementeen Graham, PT  [facility] THERAPY SERVICES [facility] REGION) [address]  [place] [address]  [phone]      Sep 26, 2021  7:30 AM  (Arrive by 7:15 AM)  PT LYMPHEDEMA TREATMENT with Clementeen Graham, PT  [place] THERAPY SERVICES [facility] REGION) [address]  [place] [address]  [phone]      Oct 02, 2021  7:30 AM  (Arrive by 7:15 AM)  PT LYMPHEDEMA TREATMENT with Clementeen Graham, PT  [facility] THERAPY SERVICES [place] [facility] REGION) [address]  [place] [address]  [phone]      Oct 02, 2021 11:30 AM  (Arrive by 11:00 AM)  Danelle Earthly NURSE with [facility] INJECTION MULTI  [place] ONCOLOGY MULTIDISCIPLINARY 2ND FLR CANCER HOSP [facility] REGION) [address]  [place] [address]  [phone]  Oct 09, 2021  7:30 AM  (Arrive by 7:15 AM)  PT LYMPHEDEMA TREATMENT with Clementeen Graham, PT  [facility] THERAPY SERVICES [place] [facility] REGION) [address]  [place] [address]  [phone]      Oct 16, 2021  7:30 AM  (Arrive by 7:15 AM)  PT LYMPHEDEMA TREATMENT with Clementeen Graham, PT  [facility] THERAPY SERVICES [facility] REGION) [address]  [place] [address]  [phone]      Nov 08, 2021 10:00 AM  RETURN  GENERAL with Adeyemi Cheri Rous, MD  [facility] PLASTIC SURGERY [place] C[place] (TRIANGLE CARY/APEX/HOLLY [facility] REGION) [address]  [place] [address]  [phone]      Nov 29, 2021 10:00 AM  (Arrive by 9:30 AM)  RETURN  GENERAL with Thyra Breed, NP  [facility] CHILDRENS HEMATOLOGY ONCOLOGY CANCER HOSP [facility] REGION) [address]  [place] [address]  [phone]      Feb 12, 2022  9:00 AM  (Arrive by 8:30 AM)  FOLLOW UP 30 with Rogelio Seen, MD  [facility] RADIATION ONCOLOGY  [facility] REGION) [address]  [place] [address]  [phone]

## 2021-08-16 ENCOUNTER — Ambulatory Visit
Admit: 2021-08-16 | Payer: PRIVATE HEALTH INSURANCE | Attending: Rehabilitative and Restorative Service Providers" | Primary: Rehabilitative and Restorative Service Providers"

## 2021-08-16 ENCOUNTER — Ambulatory Visit
Admit: 2021-08-16 | Discharge: 2021-09-05 | Payer: PRIVATE HEALTH INSURANCE | Attending: Rehabilitative and Restorative Service Providers" | Primary: Rehabilitative and Restorative Service Providers"

## 2021-08-17 NOTE — Unmapped (Signed)
[facility] THERAPY SERVICES [place]  OUTPATIENT PHYSICAL THERAPY  08/16/2021  Note Type: Treatment Note       Patient Name: Tami Gibson  Date of Birth:1990-08-05  Diagnosis:   Encounter Diagnoses   Name Primary?   ??? Lymphedema syndrome, postmastectomy Yes   ??? Scar condition and fibrosis of skin    ??? Malignant neoplasm of right breast in female, estrogen receptor positive, unspecified site of breast (CMS-HCC)      Referring MD:  [name], MD     Date of Onset of Impairment-07/09/2021  Date PT Care Plan Established or Reviewed-07/30/2021  Date PT Treatment Started-07/30/2021       Session #:  2 of 11      Contraindications:  none  Precautions:  none  Red Flags:  history of cancer, right breast IB    Metastatic cancer:   no   Assessment/Progress toward goals:  Pt is now post op day two, s/p bilateral implants.  Pt arrives with abdominal binder and compression bra in place.  Pt with guarded posturing due to recent surgery. Very limited treatment session today.  Mild MLD only, emphasis at arm, as anastomosies covered with compression devices, which we did not remove.      ASSESSMENT/Reason for Referral:   31 y.o. 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  Pt is s/p right breast cancer with mastectomy and tissue expander, ALND and radiation.  The patient was treated for lymphedema and recently discharged.  She began to note increased swelling in her right arm and trunk in the past 3 weeks.  She is to undergo reconstruction surgery in 2 weeks, and is concerned about her lymphedema worsening.  She has been consistently excellent in self care.   .  In addition to lymphedema , the patient is experiencing these side effects from cancer treatments: Radiation: Fibrosis/myofascial restrictions  Currently the right limb is 6.44 % bigger than the left limb.     Recommendations for treatment/garments:  1) Patient will obtain proper garments to address swelling and improve tissue integrity. Garment recommendations :May need to increase to 30-40 mm Hg RTW sleeve/glove, or go into custom sleeve, either Elvarex or Mediven 550 if swelling does not reduce with treatments   2) Pt will be seen clinically for rehabilitation to include treatments to address as needed: edema/lymphedema, pain, ROM, strength, balance and endurance deficits, as well as learning self care strategies to address these deficits  3) Pt has  a pneumatic compression pump  Patient requires skilled Physical Therapy services  for the following problem list and secondary functional limitations:    Problem List:   Uncontrolled swelling and/or lack of appropriate compression garment    Secondary Functional Limitations:  Uncontrolled swelling can increase chances of  infection, and limit functional ROM     Patient Goals: Decrease swelling    Physical Therapy Goals:   In 1-3 months: Pt and/or caregiver will:  Be able to verbalize proper skin care, lymphedema risk reduction strategies, use and care of compression garments/devices x1 MET 08-16-21  Be able to demonstrate self MLD, donning/doffing garments to manage swelling x1 MET 08-16-21  Decrease limb volume by 1-3 % for proper fit of clothing and to minimize infection risk x 1    In 6-12 months: Pt and/or caregiver will:  Be independent with home management of self care related to lymphedema to reduce infection risk and recurrent hospitalizations  Retain an optimal limb volume that allows pt to fit into  appropriate compression garments and clothing to improve functional use of UE and QOL    Prognosis for goal achievement:   Good due to Previous success.      PLAN:         Planned frequency and duration  of treatment:   1 x week x 10 weeks    Next Visit Plan: MLD, MFR, lymphatouch to right UE/trunk     SUBJECTIVE:  Patient???s communication preference: verbal, written, visual, prn   History of Present Illness/ Pt reports:  Two days post op, sore all over     Location of pain: trunk and axilla   Current Pain: 3/10   Max pain:  3/10  Least pain:  0/10  Nature of pain: Intermittent    OBJECTIVE:      Posture/Observations:  Forward head, rounded shoulders , guarded posturing              Girth Measurements: taken in cm    Date: 2-28 2-28            Right Left RIght Left Right Left RIght Left RIght Left   MCP 18 18           Mid palm 20 19           Thumb DIP             1st finger DIP             1st finger PIP             2nd finger DIP             2nd finger PIP             3rd finger DIP             3rd finger PIP             4th finger DIP             4th finger PIP                          Axilla             Mid breast nipple line             Ribs below breast             Natural Waistline             Umbilicus             Hips at  cm below umbilicus             Limb Volume                        Location of swelling:  right hand , wrist, forearm, elbow, arm, shoulder, axilla, chest, breast and ribs    Skin condition:  Stemmer???s sign:no.  increased skin thickness  Incision/Scar:   incision healing well postoperatively        Total Treatment Time: 30 min    Treatment Rendered:    Patient and/or caregiver and therapist were mask compliant per current [place] COVID mask policy during the entire treatment session.          Manual x 30 min   MLD to appropriate anastomoses in order to decrease swelling.  Application of compression garment/devices prn to prevent re-accumulation of fluid. limited session only on arm due to recent  surgery and compression bra/abdominal bracing in place.          Equipment provided/recommended:   N/A    Communication/consultation with other professionals:  N/A    Referrals made to the following providers:  none    Medical Necessity: This treatment is medically necessary throughout the course of this patient's life during and after cancer treatments to improve functional activities and /or to minimize the decline of functional abilities and worsening of symptoms,including infection and recurrent hospitalizations, through independent/home management strategies.  Lymphedema, a chronic, progressive condition for which there is no cure, is marked by the accumulation of protein-rich fluid in one or more quadrants of the body due to primary or secondary disruption of the lymphatic system.  The sustained accumulation results in tissue inflammation, an increase in fatty tissue, and development of obstructive connective tissue.  These changes may result in an increased risk of infection, disfigurement and a decrease in mobility and functional performance.   Pt will benefit from physical therapy by a certified lymphedema therapist to address : education in self care, precautions, and management of symptoms, optimal edema reduction/maintenance of girth, optimal soft tissue changes, fitting of appropriate compression garments, and progression of exercises to optimize functional ROM and strength, balance, and endurance in all ADL's(home, work, recreational, community).      I attest that I have reviewed the above information.  Signed: Clementeen Graham, PT   08/16/2021 7:08 AM

## 2021-08-19 ENCOUNTER — Ambulatory Visit: Admit: 2021-08-19 | Discharge: 2021-08-20 | Payer: PRIVATE HEALTH INSURANCE

## 2021-08-19 DIAGNOSIS — Z9889 Other specified postprocedural states: Principal | ICD-10-CM

## 2021-08-19 NOTE — Unmapped (Signed)
CC: Pre-op    HPI:  Tami Gibson is a 31 y.o. 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 Teola Bradley) and immediate reconstruction with tissue expander placement on 04/20/20. Patients final pathology revealed residual disease with 2/3 positive lymph nodes and she underwent ALND with Dr. Teola Bradley on 05/07/20. She is on letrozole. She underwent adjuvant radiation (completed 08/06/20). She was originally interested in DIEP flap reconstruction but did not meet the BMI criteria. She underwent tissue expander to implant exchange with fat grafting to the bilateral breasts from the abdomen on 08/13/20.     Since surgery, she reports she has been doing well.  The right sided shoulder tethering feels better after the surgery.  There has been twice when she has reached out the arm, and that there was a sloshing sound.  Otherwise, no other issues.  Pain overall controlled.  Her husband notes that the right side still seems higher than the left, but improved from previous.      Past Medical History:  Past Medical History:   Diagnosis Date   ??? Acne     started during teen yrs., Accutane 2008-2009 during college yrs   ??? Anemia     During teen years only, no anemia recently   ??? 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 Gestational hypertension 01/30/2017    end of pregnancy had hypertension, was induced   ??? Headache     migraine occasionally   ??? Hypertension     during week 37 week, pt induced and had C/S due to hypertension   ??? 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 Kidney stone    ??? Malignant neoplasm of right breast in female, 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/30/2019   ??? Pyelonephritis 09/2016    Kidney stones   ??? Urinary tract infection     Had 5-6 in Lifetime, esp high school and college age   ??? Varicella     during childhood   ??? Vitamin B 12 deficiency        Past Surgical History:  Past Surgical History:   Procedure Laterality Date   ??? BREAST BIOPSY Right     IDC   ??? CESAREAN SECTION  01/30/2017    breech   ??? CHEMOTHERAPY     ??? IR INSERT PORT AGE GREATER THAN 5 YRS  12/08/2019    IR INSERT PORT AGE GREATER THAN 5 YRS 12/08/2019 Jobe Gibbon, MD IMG VIR H&V [facility]   ??? LUMBAR PUNCTURE DIAGNOSTIC [facility] HISTORICAL RESULT)  01/2015    to relieve ICP   ??? PR BX/REMV,LYMPH NODE,DEEP AXILL Right 04/20/2020    Procedure: BX/EXC LYMPH NODE; OPEN, DEEP AXILRY NODE;  Surgeon: Moss Mc, MD;  Location: MAIN OR [facility];  Service: Surgical Oncology Breast   ??? PR CESAREAN DELIVERY ONLY N/A 01/30/2017    Procedure: CESAREAN DELIVERY ONLY;  Surgeon: Asher Muir, MD;  Location: L&D C-SECTION OR SUITES [facility];  Service: Maternal-Fetal Medicine   ??? PR CESAREAN DELIVERY ONLY N/A 02/17/2019    Procedure: CESAREAN DELIVERY ONLY;  Surgeon: Lonny Prude, MD;  Location: L&D C-SECTION OR SUITES [facility];  Service: Maternal-Fetal Medicine   ??? PR IMPLNT BIO IMPLNT FOR SOFT TISSUE REINFORCEMENT Bilateral 04/20/2020    Procedure: IMPLANTATION BIOLOGIC IMPLANT(EG, ACELLULAR DERMAL MATRIX) FOR SOFT TISSUE REINFORCEMENT(EG, BREAST, TRUNK);  Surgeon: Victorino Dike  Luna Glasgow, MD;  Location: MAIN OR [facility];  Service: Plastics   ??? PR INTRAOPERATIVE SENTINEL LYMPH NODE ID W DYE INJECTION Right 04/20/2020    Procedure: INTRAOPERATIVE IDENTIFICATION SENTINEL LYMPH NODE(S) INCLUDE INJECTION NON-RADIOACTIVE DYE, WHEN PERFORMED;  Surgeon: Moss Mc, MD;  Location: MAIN OR [place];  Service: Surgical Oncology Breast   ??? PR INTRAOPERATIVE SENTINEL LYMPH NODE ID W DYE INJECTION Right 05/07/2020    Procedure: INTRAOPERATIVE IDENTIFICATION SENTINEL LYMPH NODE(S) INCLUDE INJECTION NON-RADIOACTIVE DYE, WHEN PERFORMED;  Surgeon: Moss Mc, MD;  Location: MAIN OR [place];  Service: Surgical Oncology Breast   ??? PR MASTECTOMY, SIMPLE, COMPLETE Bilateral 04/20/2020    Procedure: MASTECTOMY, SIMPLE, COMPLETE;  Surgeon: Moss Mc, MD;  Location: MAIN OR [place];  Service: Surgical Oncology Breast   ??? PR REMOVE ARMPITS LYMPH NODES COMPLT Right 05/07/2020    Procedure: AXILLARY LYMPHADENECTOMY; COMPLETE;  Surgeon: Moss Mc, MD;  Location: MAIN OR [facility];  Service: Surgical Oncology Breast   ??? PR REPLACEMENT TISSUE EXPANDER W/PERMANENT IMPLANT Bilateral 08/13/2021    Procedure: REPLACEMENT OF TISSUE EXPANDER WITH PERMANENT IMPLANT;  Surgeon: Arsenio Katz, MD;  Location: MAIN OR [facility];  Service: Plastics   ??? PR TISSUE EXPANDER PLACEMENT BREAST RECONSTRUCTION Bilateral 04/20/2020    Procedure: TISSUE EXPANDER PLACEMENT IN BREAST RECONSTRUCTION, INCLUDING SUBSEQUENT EXPANSION(S);  Surgeon: Arsenio Katz, MD;  Location: MAIN OR [facility];  Service: Plastics   ??? TONSILECTOMY, ADENOIDECTOMY, BILATERAL MYRINGOTOMY AND TUBES     ??? WISDOM TOOTH EXTRACTION  2004       Medications:  Current Outpatient Medications   Medication Sig Dispense Refill   ??? abemaciclib (VERZENIO) 100 mg Tab tablet Take 1 tablet (100 mg total) by mouth Two (2) times a day. Take with or without food. 56 tablet 5   ??? acetaminophen (TYLENOL) 325 MG tablet Take 2 tablets (650 mg total) by mouth every six (6) hours as needed for pain.     ??? calcium carbonate (OS-CAL) 500 mg calcium (1,250 mg) chewable tablet Chew. (Patient not taking: Reported on 08/13/2021)     ??? calcium carbonate (TUMS) 200 mg calcium (500 mg) chewable tablet Chew 1 tablet (200 mg of elem calcium total) daily.     ??? cholecalciferol, vitamin D3-25 mcg, 1,000 unit,, 25 mcg (1,000 unit) capsule Take 1 capsule (25 mcg total) by mouth.     ??? cyanocobalamin 100 MCG tablet Take 1 tablet (100 mcg total) by mouth daily.     ??? docusate sodium (COLACE) 100 MG capsule Take 100 mg by mouth nightly as needed (PRN). (Patient not taking: Reported on 06/10/2021)     ??? ergocalciferol-1,250 mcg, 50,000 unit, (DRISDOL) 1,250 mcg (50,000 unit) capsule Take 1,250 mcg by mouth once a week.     ??? escitalopram oxalate (LEXAPRO) 20 MG tablet Take 1 tablet (20 mg total) by mouth daily. 90 tablet 3   ??? fexofenadine (ALLEGRA) 180 MG tablet Take 180 mg by mouth. (Patient not taking: Reported on 08/13/2021)     ??? letrozole (FEMARA) 2.5 mg tablet Take 1 tablet by mouth once daily 90 tablet 0   ??? leuprolide (LUPRON) 3.75 mg injection Inject 3.75 mg into the muscle.     ??? omeprazole (PRILOSEC) 20 MG capsule Take 1 capsule by mouth once daily 90 capsule 0   ??? ondansetron (ZOFRAN-ODT) 4 MG disintegrating tablet Take 1 tablet (4 mg total) by mouth every eight (8) hours as needed for nausea. 30 tablet 3   ??? oxyCODONE (ROXICODONE) 5 MG  immediate release tablet Take 1 tablet (5 mg total) by mouth every six (6) hours as needed for pain for up to 5 days. 20 tablet 0   ??? rimegepant (NURTEC ODT) 75 mg TbDL Take 1 tablet (75 mg total) by mouth.     ??? semaglutide (OZEMPIC) 0.25 mg or 0.5 mg(2 mg/1.5 mL) PnIj injection Inject 0.25 mg under the skin every seven (7) days. 1.5 mL 3   ??? sulfamethoxazole-trimethoprim (BACTRIM DS) 800-160 mg per tablet Take 1 tablet (160 mg of trimethoprim total) by mouth every twelve (12) hours for 7 days. 14 tablet 0   ??? SUMAtriptan (IMITREX) 100 MG tablet TAKE ONE TABLET BY MOUTH AT ONSET OF MIGRAINE FOR UP TO 1 DOSE . IF SYMPTOMS PERSIST A SECOND DOSE MAY BE TAKEN IN 2 HOURS IF NEEDED     ??? topiramate (TOPAMAX) 25 MG tablet TAKE 3 TABLETS BY MOUTH ONCE DAILY     ??? traZODone (DESYREL) 50 MG tablet Take 0.5-1 tablets (25-50 mg total) by mouth nightly as needed for sleep. (for sleep) 30 tablet 5   ??? vitamin E-134 mg, 200 UNIT, 134 mg (200 UNIT) capsule Take 1 capsule (134 mg total) by mouth daily.       No current facility-administered medications for this visit.     Facility-Administered Medications Ordered in Other Visits   Medication Dose Route Frequency Provider Last Rate Last Admin   ??? leuprolide (LUPRON) 3.75 mg injection                Allergies:  No Known Allergies    Family History:   Family History   Problem Relation Age of Onset   ??? Cancer Maternal Aunt 45        breast Ca   ??? Hypertension Father    ??? Other Father 4        Benign brain tumor   ??? Hypothyroidism Mother    ??? Cancer Paternal Aunt         Breast Ca dx in 12s   ??? ALS Maternal Aunt    ??? Ovarian cancer Maternal Grandmother 64   ??? No Known Problems Maternal Grandfather    ??? Cancer Paternal Grandmother         Unknown Cancer   ??? No Known Problems Paternal Grandfather    ??? No Known Problems Maternal Aunt    ??? No Known Problems Maternal Aunt    ??? Breast cancer Maternal Cousin         Dx in 18s   ??? No Known Problems Paternal Aunt    ??? No Known Problems Daughter    ??? No Known Problems Daughter    ??? No Known Problems Other    ??? BRCA 1/2 Neg Hx    ??? Colon cancer Neg Hx    ??? Endometrial cancer Neg Hx     The patient reports no family history for bleeding disorders or anesthetic problems.    Social History:  Social History     Socioeconomic History   ??? Marital status: Married     Spouse name: Dylan   ??? Number of children: 0   Occupational History   ??? Occupation: Recreational therapist    Tobacco Use   ??? Smoking status: Never   ??? Smokeless tobacco: Never   Vaping Use   ??? Vaping Use: Never used   Substance and Sexual Activity   ??? Alcohol use: Not Currently   ??? Drug use: No   ??? Sexual  activity: Yes     Partners: Male     Birth control/protection: Pill   Other Topics Concern   ??? Exercise Yes   ??? Living Situation No   Social History Narrative    PRECONCEPTUAL ASSESSMENT:        Tami Gibson is a 31 y.o. Caucasian female    Caffeine:denies use    Cats: no    Exercise: not active    Diet: balanced    Family hx of of birth defects, chromosomal abnormality, intellectual disability, developmental delay: no.            PARTNER HISTORY:     Tami Gibson is a 59 y.o.  Caucasian female.    Occupation:  Immunologist; [employer]    He has fathered children:  no    He is healthy with no chronic illness    Family history of  birth defects, chromosomal abnormality, intellectual disability, or developmental delay: no.                ROS:   Otherwise, 12 point review of systems was completed and is negative except as per HPI.      Vitals:   There were no vitals filed for this visit.     Gen: AA in NAD  HEENT: NCAT, EOMI, PERRL, Non icteric sclerae, MMM  CV: RRR  RESP: quiet respirations on room air  EXT: warm and well perfused, compression garment to RUE  ABDOMEN: redundancy noted.  Bruising improved.  Appropriately tender.  No obvious contour irregularity  BREASTS: Some improved mobility on the right breast.  Softer.  Improved symmetry but still with some fullness superiorly on the right.  IMF location improved.  Projection similar bilaterally.    Impression and Plan:  Donnalyn Juran is here today for a post op s/p tissue expander to permanent implant (650 SCX) with liposuction from the abdomen and fat grafting to bilateral breasts 08/13/21.  -Continue restrictions (binder and bra); discussed transition of these  -Discussed Singulair to possibly mitigate capsular contracture; discussed massage of implant on the right

## 2021-08-19 NOTE — Unmapped (Signed)
APPOINTMENTS / QUESTIONS  For questions regarding appointments or your surgical care, Monday through Friday, 8 am to 4:30 pm    [facility]  [address],   [place] [address]  T: [phone]  F: [phone]    Ambulatory Surgery Center  1st Floor, Ambulatory Care Center  [address]  [place], [place] [address]  T: [phone]  Website:  [facility]    [facility]  [address], [place], [place] [address]  Ground Floor [facility]  T: [phone]  F: [phone]    [facility]  [address], [place], [place] [address]  T: [phone]  F: [phone]    [facility]  [address], [place], [place] [address]   1st Floor [facility]  T: [phone]  F: [phone]    [facility]  [address], [place], [place] [address]  Third Floor, [address]  T: [phone]   F: [phone]    Administrative Office  [address]  [place], [place] [address]  T:  [phone]  F:  [phone]      Physicians:  Monike Bragdon Jeni Carr MD:   Nurse:  Catricia Scheerer BSN, RN [phone]    Surgery Scheduler: Cristina Etchart Martin  [phone]     Lynn Damitz MD:   Nurse:  Nicole Bailey BSN, RN, CPSN [phone]  Surgery Scheduler:   Cristina Etchart Martin  [phone]     Adeyemi Yemi Ogunleye MD:  Nurse: Jacquelyn Eron BSN, RN, BC  [phone]  Surgery Scheduler: Kina Williamson [phone]    Jeyhan Wood MD:  Nurse: Rachel Heller BSN, RN, CPSN  [phone]  Surgery Scheduler: Kina Williamson [phone]    Advance Practice Providers:  Renee E Edkins, DNP, NP-C:   Surgery Scheduler: Kina Williamson [phone]     Payton Leonhardt, PA-C:   Surgery Scheduler: Kina Williamson [phone]     Financial Counselor:   Timothy Watson  T: [phone]  F: [phone]    Billing Questions/Financial Navigation:  [phone]  [place] Customer Service Call Center: [phone]    Surgery Scheduling:  You will receive a phone call from the surgery schedulers regarding date for surgery in 1-2 weeks from  your appointment.    Please call one of the above numbers if you have not received a phone call 2 weeks after your appointment with the provider.      [facility] Contact Information:   Please call to schedule  *Radiology (CT, X-ray, Ultrasound) [phone]  *Mammography & Breast Ultrasound [phone]  *MRI [phone]  *Interventional Radiology [phone]    AFTER HOURS/HOLIDAYS:  For emergencies after-hours (after 5pm, or weekends): call the [facility] operator ([phone]) and ask to page the Plastic Surgery resident on call. You will be directed to a surgery resident who likely is not immediately aware of the details of your case, but can help you deal with any emergencies that cannot wait until regular business hours.  Please be aware that this person is responding to many in-hospital emergencies and patient issues and may not answer your phone call immediately, but will return your call as soon as possible.    Precare Location:  [facility] [place] (Former[facility])   [address]  [place],  [address] ([place] - [place] [facility] near [address])    Blood Work Location:  [address], [place] [address]. There are   no suite numbers, but it is located on the first floor there. Go to the main desk and tell them you are there for walk-in labs.  Precare:   The day prior to your scheduled surgery, [facility] will call you with instructions.  If you have not heard from them by 4PM, and would like to check on the status of your surgery, please call:  [facility]: [phone]  [facility]: [phone]  [facility]: [phone]  [facility]:  [phone]    Insurance Denials:    All appeal initiation needs to be started by the patient.  We do not appeal insurance denials.     [place] Hotline:  [phone]    [facility] forms and paperwork:  Please allow  a 2 week turn around for forms to be completed and faxed.

## 2021-08-22 NOTE — Unmapped (Addendum)
[facility] THERAP[facility] [place]  OUTPATIENT PHYSICAL THERAPY  08/22/2021  Note Type: Treatment Note       Patient Name: Tami Gibson  Date of Birth:11-16-1990  Diagnosis:   Encounter Diagnoses   Name Primary?   ??? Lymphedema syndrome, postmastectomy Yes   ??? Scar condition and fibrosis of skin    ??? Malignant neoplasm of right breast in female, estrogen receptor positive, unspecified site of breast (CMS-HCC)      Referring MD:  [name], MD     Date of Onset of Impairment-07/09/2021  Date PT Care Plan Established or Reviewed-07/30/2021  Date PT Treatment Started-07/30/2021       Session #:  3 of 11      Contraindications:  none  Precautions:  none  Red Flags:  history of cancer, right breast IB    Metastatic cancer:   no   Assessment/Progress toward goals:  Pt is feeling much better, now with spanx/girdle, binder removed.  She is able to perform light ADL's.  Bruising improved.  Gait WNL.  Limb volumes are stable, hand swelling is decreased on right.      ASSESSMENT/Reason for Referral:   31 y.o. 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  Pt is s/p right breast cancer with mastectomy and tissue expander, ALND and radiation.  The patient was treated for lymphedema and recently discharged.  She began to note increased swelling in her right arm and trunk in the past 3 weeks.  She is to undergo reconstruction surgery in 2 weeks, and is concerned about her lymphedema worsening.  She has been consistently excellent in self care.   .  In addition to lymphedema , the patient is experiencing these side effects from cancer treatments: Radiation: Fibrosis/myofascial restrictions  Currently the right limb is 6.44 % bigger than the left limb.     Recommendations for treatment/garments:  1) Patient will obtain proper garments to address swelling and improve tissue integrity. Garment recommendations :May need to increase to 30-40 mm Hg RTW sleeve/glove, or go into custom sleeve, either Elvarex or Mediven 550 if swelling does not reduce with treatments   2) Pt will be seen clinically for rehabilitation to include treatments to address as needed: edema/lymphedema, pain, ROM, strength, balance and endurance deficits, as well as learning self care strategies to address these deficits  3) Pt has  a pneumatic compression pump  Patient requires skilled Physical Therapy services  for the following problem list and secondary functional limitations:    Problem List:   Uncontrolled swelling and/or lack of appropriate compression garment    Secondary Functional Limitations:  Uncontrolled swelling can increase chances of  infection, and limit functional ROM     Patient Goals: Decrease swelling    Physical Therapy Goals:   In 1-3 months: Pt and/or caregiver will:  Be able to verbalize proper skin care, lymphedema risk reduction strategies, use and care of compression garments/devices x1 MET 08-16-21  Be able to demonstrate self MLD, donning/doffing garments to manage swelling x1 MET 08-16-21  Decrease limb volume by 1-3 % for proper fit of clothing and to minimize infection risk x 1    In 6-12 months: Pt and/or caregiver will:  Be independent with home management of self care related to lymphedema to reduce infection risk and recurrent hospitalizations  Retain an optimal limb volume that allows pt to fit into appropriate compression garments and clothing to improve functional use of UE and QOL    Prognosis for  goal achievement:   Good due to Previous success.      PLAN:         Planned frequency and duration  of treatment:   1 x week x 10 weeks    Next Visit Plan: MLD, MFR, lymphatouch to right UE/trunk     SUBJECTIVE:  Patient???s communication preference: verbal, written, visual, prn   History of Present Illness/ Pt reports:  Two days post op, sore all over     Location of pain: trunk and axilla   Current Pain:  3/10   Max pain:  3/10  Least pain:  0/10  Nature of pain: Intermittent    OBJECTIVE:      Posture/Observations:  Forward head, rounded shoulders , guarded posturing              Girth Measurements: taken in cm    Date: 2-28 2-28 3-23 3-23          Right Left RIght Left Right Left RIght Left RIght Left   MCP 18 18 17.6 17.2         Mid palm 20 19 19.8 17.5         Thumb DIP             1st finger DIP             1st finger PIP             2nd finger DIP             2nd finger PIP             3rd finger DIP             3rd finger PIP             4th finger DIP             4th finger PIP                          Axilla             Mid breast nipple line             Ribs below breast             Natural Waistline             Umbilicus             Hips at  cm below umbilicus             Limb Volume                        Location of swelling:  right hand , wrist, forearm, elbow, arm, shoulder, axilla, chest, breast and ribs    Skin condition:  Stemmer???s sign:no.  increased skin thickness  Incision/Scar:   incision healing well postoperatively        Total Treatment Time: 45 min    Treatment Rendered:    Patient and/or caregiver and therapist were mask compliant per current [facility] COVID mask policy during the entire treatment session.          Manual x 45 min   MLD to appropriate anastomoses in order to decrease swelling.  Application of compression garment/devices prn to prevent re-accumulation of fluid.     Keep arms to 90 degrees, gentle massage to axillary cording   Equipment provided/recommended:   N/A    Communication/consultation  with other professionals:  N/A    Referrals made to the following providers:  none    Medical Necessity: This treatment is medically necessary throughout the course of this patient's life during and after cancer treatments to improve functional activities and /or to minimize the decline of functional abilities and worsening of symptoms,including infection and recurrent hospitalizations, through independent/home management strategies.  Lymphedema, a chronic, progressive condition for which there is no cure, is marked by the accumulation of protein-rich fluid in one or more quadrants of the body due to primary or secondary disruption of the lymphatic system.  The sustained accumulation results in tissue inflammation, an increase in fatty tissue, and development of obstructive connective tissue.  These changes may result in an increased risk of infection, disfigurement and a decrease in mobility and functional performance.   Pt will benefit from physical therapy by a certified lymphedema therapist to address : education in self care, precautions, and management of symptoms, optimal edema reduction/maintenance of girth, optimal soft tissue changes, fitting of appropriate compression garments, and progression of exercises to optimize functional ROM and strength, balance, and endurance in all ADL's(home, work, recreational, community).      I attest that I have reviewed the above information.  SignedClementeen Graham, PT   08/22/2021 2:07 PM

## 2021-08-24 NOTE — Unmapped (Signed)
CC: Pre-op    HPI:  Tami Gibson is a 31 y.o. 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 Teola Bradley) and immediate reconstruction with tissue expander placement on 04/20/20. Patients final pathology revealed residual disease with 2/3 positive lymph nodes and she underwent ALND with Dr. Teola Bradley on 05/07/20. She is on letrozole. She underwent adjuvant radiation (completed 08/06/20). She was originally interested in DIEP flap reconstruction but did not meet the BMI criteria. She underwent tissue expander to implant exchange with fat grafting to the bilateral breasts from the abdomen on 08/13/20.     Since her last visit, she has been doing well. She has been compliant with compression. She is curious about the allergy drug that prevents capsular contracture. She is also wondering when she can restart her [place]. She is overall very happy with the outcome, she would just like to have the right breast drop a little.     Past Medical History:  Past Medical History:   Diagnosis Date   ??? Acne     started during teen yrs., Accutane 2008-2009 during college yrs   ??? Anemia     During teen years only, no anemia recently   ??? 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 Gestational hypertension 01/30/2017    end of pregnancy had hypertension, was induced   ??? Headache     migraine occasionally   ??? Hypertension     during week 37 week, pt induced and had C/S due to hypertension   ??? 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 Kidney stone    ??? Malignant neoplasm of right breast in female, 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/30/2019   ??? Pyelonephritis 09/2016    Kidney stones   ??? Urinary tract infection     Had 5-6 in [place], esp high school and college age   ??? Varicella     during childhood   ??? Vitamin B 12 deficiency        Past Surgical History:  Past Surgical History:   Procedure Laterality Date   ??? BREAST BIOPSY Right     IDC   ??? CESAREAN SECTION  01/30/2017    breech   ??? CHEMOTHERAPY     ??? IR INSERT PORT AGE GREATER THAN 5 YRS  12/08/2019    IR INSERT PORT AGE GREATER THAN 5 YRS 12/08/2019 Jobe Gibbon, MD IMG VIR H&V [facility]   ??? LUMBAR PUNCTURE DIAGNOSTIC [facility] HISTORICAL RESULT)  01/2015    to relieve ICP   ??? PR BX/REMV,LYMPH NODE,DEEP AXILL Right 04/20/2020    Procedure: BX/EXC LYMPH NODE; OPEN, DEEP AXILRY NODE;  Surgeon: Moss Mc, MD;  Location: MAIN OR [facility];  Service: Surgical Oncology Breast   ??? PR CESAREAN DELIVERY ONLY N/A 01/30/2017    Procedure: CESAREAN DELIVERY ONLY;  Surgeon: Asher Muir, MD;  Location: L&D C-SECTION OR SUITES [facility];  Service: Maternal-Fetal Medicine   ??? PR CESAREAN DELIVERY ONLY N/A 02/17/2019    Procedure: CESAREAN DELIVERY ONLY;  Surgeon: Lonny Prude, MD;  Location: L&D C-SECTION OR SUITES [facility];  Service: Maternal-Fetal Medicine   ??? PR IMPLNT BIO IMPLNT FOR SOFT TISSUE REINFORCEMENT Bilateral 04/20/2020    Procedure: IMPLANTATION BIOLOGIC IMPLANT(EG, ACELLULAR DERMAL MATRIX) FOR SOFT TISSUE REINFORCEMENT(EG, BREAST, TRUNK);  Surgeon: Arsenio Katz, MD;  Location: MAIN OR [facility];  Service: Plastics   ???  PR INTRAOPERATIVE SENTINEL LYMPH NODE ID W DYE INJECTION Right 04/20/2020    Procedure: INTRAOPERATIVE IDENTIFICATION SENTINEL LYMPH NODE(S) INCLUDE INJECTION NON-RADIOACTIVE DYE, WHEN PERFORMED;  Surgeon: Moss Mc, MD;  Location: MAIN OR [place];  Service: Surgical Oncology Breast   ??? PR INTRAOPERATIVE SENTINEL LYMPH NODE ID W DYE INJECTION Right 05/07/2020    Procedure: INTRAOPERATIVE IDENTIFICATION SENTINEL LYMPH NODE(S) INCLUDE INJECTION NON-RADIOACTIVE DYE, WHEN PERFORMED;  Surgeon: Moss Mc, MD;  Location: MAIN OR [place];  Service: Surgical Oncology Breast   ??? PR MASTECTOMY, SIMPLE, COMPLETE Bilateral 04/20/2020    Procedure: MASTECTOMY, SIMPLE, COMPLETE;  Surgeon: Moss Mc, MD;  Location: MAIN OR [place];  Service: Surgical Oncology Breast   ??? PR REMOVE ARMPITS LYMPH NODES COMPLT Right 05/07/2020    Procedure: AXILLARY LYMPHADENECTOMY; COMPLETE;  Surgeon: Moss Mc, MD;  Location: MAIN OR [facility];  Service: Surgical Oncology Breast   ??? PR REPLACEMENT TISSUE EXPANDER W/PERMANENT IMPLANT Bilateral 08/13/2021    Procedure: REPLACEMENT OF TISSUE EXPANDER WITH PERMANENT IMPLANT;  Surgeon: Arsenio Katz, MD;  Location: MAIN OR [facility];  Service: Plastics   ??? PR TISSUE EXPANDER PLACEMENT BREAST RECONSTRUCTION Bilateral 04/20/2020    Procedure: TISSUE EXPANDER PLACEMENT IN BREAST RECONSTRUCTION, INCLUDING SUBSEQUENT EXPANSION(S);  Surgeon: Arsenio Katz, MD;  Location: MAIN OR [facility];  Service: Plastics   ??? TONSILECTOMY, ADENOIDECTOMY, BILATERAL MYRINGOTOMY AND TUBES     ??? WISDOM TOOTH EXTRACTION  2004       Medications:  Current Outpatient Medications   Medication Sig Dispense Refill   ??? abemaciclib (VERZENIO) 100 mg Tab tablet Take 1 tablet (100 mg total) by mouth Two (2) times a day. Take with or without food. 56 tablet 5   ??? acetaminophen (TYLENOL) 325 MG tablet Take 2 tablets (650 mg total) by mouth every six (6) hours as needed for pain.     ??? calcium carbonate (OS-CAL) 500 mg calcium (1,250 mg) chewable tablet Chew. (Patient not taking: Reported on 08/13/2021)     ??? calcium carbonate (TUMS) 200 mg calcium (500 mg) chewable tablet Chew 1 tablet (200 mg of elem calcium total) daily.     ??? cholecalciferol, vitamin D3-25 mcg, 1,000 unit,, 25 mcg (1,000 unit) capsule Take 1 capsule (25 mcg total) by mouth.     ??? cyanocobalamin 100 MCG tablet Take 1 tablet (100 mcg total) by mouth daily.     ??? docusate sodium (COLACE) 100 MG capsule Take 1 capsule (100 mg total) by mouth nightly as needed (PRN).     ??? ergocalciferol-1,250 mcg, 50,000 unit, (DRISDOL) 1,250 mcg (50,000 unit) capsule Take 1,250 mcg by mouth once a week.     ??? escitalopram oxalate (LEXAPRO) 20 MG tablet Take 1 tablet (20 mg total) by mouth daily. 90 tablet 3   ??? fexofenadine (ALLEGRA) 180 MG tablet Take 180 mg by mouth. (Patient not taking: Reported on 08/13/2021)     ??? letrozole (FEMARA) 2.5 mg tablet Take 1 tablet by mouth once daily 90 tablet 0   ??? leuprolide (LUPRON) 3.75 mg injection Inject 3.75 mg into the muscle.     ??? omeprazole (PRILOSEC) 20 MG capsule Take 1 capsule by mouth once daily 90 capsule 0   ??? ondansetron (ZOFRAN-ODT) 4 MG disintegrating tablet Take 1 tablet (4 mg total) by mouth every eight (8) hours as needed for nausea. 30 tablet 3   ??? rimegepant (NURTEC ODT) 75 mg TbDL Take 1 tablet (75 mg total) by mouth.     ??? semaglutide (OZEMPIC)  0.25 mg or 0.5 mg(2 mg/1.5 mL) PnIj injection Inject 0.25 mg under the skin every seven (7) days. 1.5 mL 3   ??? SUMAtriptan (IMITREX) 100 MG tablet TAKE ONE TABLET BY MOUTH AT ONSET OF MIGRAINE FOR UP TO 1 DOSE . IF SYMPTOMS PERSIST A SECOND DOSE MAY BE TAKEN IN 2 HOURS IF NEEDED     ??? topiramate (TOPAMAX) 25 MG tablet TAKE 3 TABLETS BY MOUTH ONCE DAILY     ??? traZODone (DESYREL) 50 MG tablet Take 0.5-1 tablets (25-50 mg total) by mouth nightly as needed for sleep. (for sleep) 30 tablet 5   ??? vitamin E-134 mg, 200 UNIT, 134 mg (200 UNIT) capsule Take 1 capsule (134 mg total) by mouth daily.       No current facility-administered medications for this visit.     Facility-Administered Medications Ordered in Other Visits   Medication Dose Route Frequency Provider Last Rate Last Admin   ??? leuprolide (LUPRON) 3.75 mg injection                Allergies:  No Known Allergies    Family History:   Family History   Problem Relation Age of Onset   ??? Cancer Maternal Aunt 45        breast Ca   ??? Hypertension Father    ??? Other Father 28        Benign brain tumor   ??? Hypothyroidism Mother    ??? Cancer Paternal Aunt         Breast Ca dx in 74s   ??? ALS Maternal Aunt    ??? Ovarian cancer Maternal Grandmother 75   ??? No Known Problems Maternal Grandfather    ??? Cancer Paternal Grandmother         Unknown Cancer   ??? No Known Problems Paternal Grandfather    ??? No Known Problems Maternal Aunt    ??? No Known Problems Maternal Aunt    ??? Breast cancer Maternal Cousin         Dx in 73s   ??? No Known Problems Paternal Aunt    ??? No Known Problems Daughter    ??? No Known Problems Daughter    ??? No Known Problems Other    ??? BRCA 1/2 Neg Hx    ??? Colon cancer Neg Hx    ??? Endometrial cancer Neg Hx     The patient reports no family history for bleeding disorders or anesthetic problems.    Social History:  Social History     Socioeconomic History   ??? Marital status: Married     Spouse name: Dylan   ??? Number of children: 0   Occupational History   ??? Occupation: Recreational therapist    Tobacco Use   ??? Smoking status: Never   ??? Smokeless tobacco: Never   Vaping Use   ??? Vaping Use: Never used   Substance and Sexual Activity   ??? Alcohol use: Not Currently   ??? Drug use: No   ??? Sexual activity: Yes     Partners: Male     Birth control/protection: Pill   Other Topics Concern   ??? Exercise Yes   ??? Living Situation No   Social History Narrative    PRECONCEPTUAL ASSESSMENT:        Tami Gibson is a 31 y.o. Caucasian female    Caffeine:denies use    Cats: no    Exercise: not active    Diet: balanced    Family hx  of of birth defects, chromosomal abnormality, intellectual disability, developmental delay: no.            PARTNER HISTORY:     Domingo Dimes is a 18 y.o.  Caucasian female.    Occupation:  Immunologist; [employer]    He has fathered children:  no    He is healthy with no chronic illness    Family history of  birth defects, chromosomal abnormality, intellectual disability, or developmental delay: no.                ROS:   Otherwise, 12 point review of systems was completed and is negative except as per HPI.      Vitals:   There were no vitals filed for this visit.     Gen: AA in NAD  HEENT: NCAT, EOMI, PERRL, Non icteric sclerae, MMM  CV: RRR  RESP: quiet respirations on room air  EXT: warm and well perfused, compression garment to RUE  ABDOMEN: redundancy noted.  Bruising improved.  Appropriately tender.  No obvious contour irregularity  BREASTS: Some improved mobility on the right breast.  Softer.  Improved symmetry but still with some fullness superiorly on the right.  IMF location improved.  Projection similar bilaterally.    Impression and Plan:  Tami Gibson is here today for a post op s/p tissue expander to permanent implant (650 SCX) with liposuction from the abdomen and fat grafting to bilateral breasts 08/13/21. Doing well.     -Continue restrictions (binder and bra); discussed transition of these  -Discussed Singulair to possibly mitigate capsular contracture; discussed massage of implant on the right  - Okay to restart Verzenio  -Follow up in 2-3 weeks

## 2021-08-26 ENCOUNTER — Telehealth: Admit: 2021-08-26 | Discharge: 2021-08-27 | Payer: PRIVATE HEALTH INSURANCE | Attending: Clinical | Primary: Clinical

## 2021-08-26 ENCOUNTER — Telehealth
Admit: 2021-08-26 | Discharge: 2021-08-27 | Payer: PRIVATE HEALTH INSURANCE | Attending: Internal Medicine | Primary: Internal Medicine

## 2021-08-26 DIAGNOSIS — E661 Drug-induced obesity: Principal | ICD-10-CM

## 2021-08-26 DIAGNOSIS — Z6836 Body mass index (BMI) 36.0-36.9, adult: Principal | ICD-10-CM

## 2021-08-26 DIAGNOSIS — F419 Anxiety disorder, unspecified: Principal | ICD-10-CM

## 2021-08-26 NOTE — Unmapped (Signed)
TeleHealth Video Encounter  This medical encounter was conducted virtually using [email or web address]  TeleHealth protocols.    Patient ID: Tami Gibson is a 31 y.o. female who presents by video interaction for evaluation of anxiety, weight    Present on Video Call: patient at home    Assessment/Plan:      1. Anxiety  Much improved, largely since she is past her breast reconstruction surgery that was done on 3/14. Remains on Lexapro 20mg  but may want to wean back to 10mg  in a few weeks. Has trazodone to use as needed, though hasn't needed it in a week.     2. 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 Had 3 days of nausea, manageable with Zofran. Will continue 0.25mg  weekly until nausea side effect subsides completely before increasing to 0.5mg  weekly.         Preventive services addressed today  We did not review preventive services today    No follow-ups on file.       Subjective:     HPI  Underwent surgery on 3/14 for implant replacement.   She did start Ozempic a week ago on 3/21 and this did cause nausea enough to need Zofran every 8 hours. This subsided within 3 days. Next dose due tomorrow.  Anxiety doing better. She did rely on Trazodone nightly leading up to her surgery and for several nights post-op. Thinks her main challenge with sleeping right is more post-op discomfort, but hasn't needed trazodone since last week. Overall anxiety is benefiting from higher dose of Lexapro. Will want to taper back to 10mg  daily after her current 20mg  supply finishes.        Objective:     As part of this Video Visit, no in-person exam was conducted.  Video interaction permitted the following observations.    General: Well appearing  RESP: Relaxed respiratory effort.   NEURO: Normal coordination.  No tremors observed.  PSYCH: Alert and oriented.  Speech fluent and sensible.  Calm affect.           The patient reports they are currently: at home. I spent 1 minutes on the real-time audio and video with the patient on the date of service. I 5spent an additional 2 minutes on pre- and post-visit activities on the date of service.     The patient was physically located in [place] or a state in which I am permitted to provide care. The patient and/or parent/guardian understood that s/he may incur co-pays and cost sharing, and agreed to the telemedicine visit. The visit was reasonable and appropriate under the circumstances given the patient's presentation at the time.    The patient and/or parent/guardian has been advised of the potential risks and limitations of this mode of treatment (including, but not limited to, the absence of in-person examination) and has agreed to be treated using telemedicine. The patient's/patient's family's questions regarding telemedicine have been answered.     If the visit was completed in an ambulatory setting, the patient and/or parent/guardian has also been advised to contact their provider???s office for worsening conditions, and seek emergency medical treatment and/or call 911 if the patient deems either necessary.

## 2021-08-27 NOTE — Unmapped (Signed)
[facility]  Comprehensive Cancer Support Program   Telehealth Encounter    *non billable encounter*      Encounter Description: This encounter was conducted via telephone in the setting of State of Emergency due to COVID-19 Pandemic.     The patient reports they are currently: not at home. I spent 15 minutes on the phone with the patient on the date of service. I spent an additional 5 minutes on pre- and post-visit activities on the date of service.     The patient was physically located in [place] or a state in which I am permitted to provide care. The patient and/or parent/guardian understood that s/he may incur co-pays and cost sharing, and agreed to the telemedicine visit. The visit was reasonable and appropriate under the circumstances given the patient's presentation at the time.    The patient and/or parent/guardian has been advised of the potential risks and limitations of this mode of treatment (including, but not limited to, the absence of in-person examination) and has agreed to be treated using telemedicine. The patient's/patient's family's questions regarding telemedicine have been answered.     If the visit was completed in an ambulatory setting, the patient and/or parent/guardian has also been advised to contact their provider???s office for worsening conditions, and seek emergency medical treatment and/or call 911 if the patient deems either necessary.      Assessment:  Tami Gibson is a 31 y.o. female with a history of breast cancer. Referred by Luetta Nutting, PT for support navigating survivorship and long term impact of treatment (lymphedema). She describes a normal and healthy emotional response to treatment and is interested in engaging in therapy as she transitions from active treatment to survivorship.     Risk Assessment:  A suicide and violence risk assessment was performed as part of this evaluation. There is no acute risk for suicide or violence at this time.  While future psychiatric events cannot be accurately predicted, the patient does not currently require acute inpatient psychiatric care and does not currently meet [facility] involuntary commitment criteria.       Plan:  Will continue to follow for supportive counseling.      Subjective:   Patient interviewed at an outdoor mall in a semi- private place, accompanied by no one. Shared updates since our last visit. Surgery went very well and she is feeling good in her recovery. Going back to work virtually this week. Mood has been good and less anxiety. Declined need for full appt today, scheduled follow-up for end of the month. Provided supportive counseling, active listening, normalization, and psychoeducation.     Objective:    Mental Status Exam:  Speech/Language:    Normal rate, volume, tone, fluency   Mood:  euthymic   Thought process and Associations:   Logical, linear, clear, coherent, goal directed   Abnormal/psychotic thought content:     Denies SI, HI, self harm, delusions, obsessions, paranoid ideation, or ideas of reference   Perceptual disturbances:     Does not endorse auditory or visual hallucinations     Orientation:   Oriented to person, place, time, and general circumstances   Insight:     Intact   Judgment:    Intact   Impulse Control:   Intact     [name], LCSW  Comprehensive Cancer Support Program  Phone: [phone]  Pager: [phone]

## 2021-08-28 NOTE — Unmapped (Signed)
[facility] Refill Coordination Note    Specialty Medication(s) to be Shipped:   Hematology/Oncology: Verzenio 100mg     Other medication(s) to be shipped: No additional medications requested for fill at this time     Tami Gibson, DOB: 10/24/1990  Phone: [phone] (home)       All above HIPAA information was verified with patient.     Was a Nurse, learning disability used for this call? No    Completed refill call assessment today to schedule patient's medication shipment from the [facility] [phone]).  All relevant notes have been reviewed.     Specialty medication(s) and dose(s) confirmed: Regimen is correct and unchanged.   Changes to medications: Tami Gibson reports no changes at this time.  Changes to insurance: No  New side effects reported not previously addressed with a pharmacist or physician: None reported  Questions for the pharmacist: No    Confirmed patient received a Conservation officer, historic buildings and a Surveyor, mining with first shipment. The patient will receive a drug information handout for each medication shipped and additional FDA Medication Guides as required.       DISEASE/MEDICATION-SPECIFIC INFORMATION        N/A    SPECIALTY MEDICATION ADHERENCE     Medication Adherence    Patient reported X missed doses in the last month: 0  Specialty Medication: Verzenio 100mg   Patient is on additional specialty medications: No  Informant: patient              Were doses missed due to medication being on hold? No    Verzenio 100 mg: 14 days of medicine on hand     REFERRAL TO PHARMACIST     Referral to the pharmacist: Not needed      [facility]     Shipping address confirmed in Epic.     Delivery Scheduled: Yes, Expected medication delivery date: 09/04/21.     Medication will be delivered via Next Day Courier to the prescription address in Epic WAM.    Tami Gibson   [facility] Specialty Technician

## 2021-08-29 ENCOUNTER — Ambulatory Visit: Admit: 2021-08-29 | Discharge: 2021-08-30 | Payer: PRIVATE HEALTH INSURANCE

## 2021-08-29 DIAGNOSIS — Z9889 Other specified postprocedural states: Principal | ICD-10-CM

## 2021-08-29 NOTE — Unmapped (Signed)
APPOINTMENTS / QUESTIONS  For questions regarding appointments or your surgical care, Monday through Friday, 8 am to 4:30 pm    [facility]  [address],   [place] [address]  T: [phone]  F: [phone]    Ambulatory Surgery Center  1st Floor, Ambulatory Care Center  [address]  [place], [place] [address]  T: [phone]  Website:  [facility]    [facility]  [address], [place], [place] [address]  Ground [facility]  T: [phone]  F: [phone]    [facility]  [address], [place], [place] [address]  T: [phone]  F: [phone]    [facility]  [address], [place], [place] [address]   1st Floor [facility]  T: [phone]  F: [phone]    [facility]  [address], [place], [place] [address]  Third Floor, [address]  T: [phone]   F: [phone]    Administrative Office  [address]  [place], [place] [address]  T:  [phone]  F:  [phone]      Physicians:  Becky Berberian Jeni Carr MD:   Nurse:  Yoshi Mancillas BSN, RN [phone]    Surgery Scheduler: Cristina Etchart Martin  [phone]     Lynn Damitz MD:   Nurse:  Nicole Bailey BSN, RN, CPSN [phone]  Surgery Scheduler:   Cristina Etchart Martin  [phone]     Adeyemi Yemi Ogunleye MD:  Nurse: Jacquelyn Eron BSN, RN, BC  [phone]  Surgery Scheduler: Kina Williamson [phone]    Jeyhan Wood MD:  Nurse: Rachel Heller BSN, RN, CPSN  [phone]  Surgery Scheduler: Kina Williamson [phone]    Advance Practice Providers:  Renee E Edkins, DNP, NP-C:   Surgery Scheduler: Kina Williamson [phone]     Payton Leonhardt, PA-C:   Surgery Scheduler: Kina Williamson [phone]     Financial Counselor:   Timothy Watson  T: [phone]  F: [phone]    Billing Questions/Financial Navigation:  [phone]  [facility] Customer Service Call Center: [phone]    Surgery Scheduling:  You will receive a phone call from the surgery schedulers regarding date for surgery in 1-2 weeks from  your appointment.    Please call one of the above numbers if you have not received a phone call 2 weeks after your appointment with the provider.      [facility] Contact Information:   Please call to schedule  *Radiology (CT, X-ray, Ultrasound) [phone]  *Mammography & Breast Ultrasound [phone]  *MRI [phone]  *Interventional Radiology [phone]    AFTER HOURS/HOLIDAYS:  For emergencies after-hours (after 5pm, or weekends): call the [facility] operator ([phone]) and ask to page the Plastic Surgery resident on call. You will be directed to a surgery resident who likely is not immediately aware of the details of your case, but can help you deal with any emergencies that cannot wait until regular business hours.  Please be aware that this person is responding to many in-hospital emergencies and patient issues and may not answer your phone call immediately, but will return your call as soon as possible.    Precare Location:  [facility] [place] ([facility])   [address]  [place], [place] [address] ([place] - [facility] near [address])    Blood Work Location:  [address], [place] [address]. There are   no suite numbers, but it is located on the first floor there. Go to the main desk and tell them you are there for walk-in labs.  Precare:   The day prior to your scheduled surgery, [facility] will call you with instructions.  If you have not heard from them by 4PM, and would like to check on the status of your surgery, please call:  [facility]: [phone]  [facility]: [phone]  [facility]: [phone]  [facility]:  [phone]    Insurance Denials:    All appeal initiation needs to be started by the patient.  We do not appeal insurance denials.     [facility] Hotline:  [phone]    FLMA forms and paperwork:  Please allow  a 2 week turn around for forms to be completed and faxed.

## 2021-08-29 NOTE — Unmapped (Signed)
[facility]   OUTPATIENT PHYSICAL THERAPY  08/29/2021  Note Type: Treatment Note       Patient Name: Tami Gibson  Date of Birth:18-Nov-1990  Diagnosis:   Encounter Diagnoses   Name Primary?    Lymphedema syndrome, postmastectomy Yes    Scar condition and fibrosis of skin     Malignant neoplasm of right breast in female, estrogen receptor positive, unspecified site of breast (CMS-HCC)      Referring MD:  [name], MD     Date of Onset of Impairment-07/09/2021  Date PT Care Plan Established or Reviewed-07/30/2021  Date PT Treatment Started-07/30/2021       Session #:  4 of 11      Contraindications:  none  Precautions:  none  Red Flags:  history of cancer, right breast IB    Metastatic cancer:   no   Assessment/Progress toward goals:  Pt is feeling much better, now with spanx/girdle, binder removed.  She is able to perform light ADL's.  Bruising improved.  Gait WNL. PT had some stitches removed earlier today.  Still to stay on back, no ROM greater than 90 degrees.      ASSESSMENT/Reason for Referral:   31 y.o. 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  Pt is s/p right breast cancer with mastectomy and tissue expander, ALND and radiation.  The patient was treated for lymphedema and recently discharged.  She began to note increased swelling in her right arm and trunk in the past 3 weeks.  She is to undergo reconstruction surgery in 2 weeks, and is concerned about her lymphedema worsening.  She has been consistently excellent in self care.   .  In addition to lymphedema , the patient is experiencing these side effects from cancer treatments: Radiation: Fibrosis/myofascial restrictions  Currently the right limb is 6.44 % bigger than the left limb.     Recommendations for treatment/garments:  1) Patient will obtain proper garments to address swelling and improve tissue integrity. Garment recommendations :May need to increase to 30-40 mm Hg RTW sleeve/glove, or go into custom sleeve, either Elvarex or Mediven 550 if swelling does not reduce with treatments   2) Pt will be seen clinically for rehabilitation to include treatments to address as needed: edema/lymphedema, pain, ROM, strength, balance and endurance deficits, as well as learning self care strategies to address these deficits  3) Pt has  a pneumatic compression pump  Patient requires skilled Physical Therapy services  for the following problem list and secondary functional limitations:    Problem List:   Uncontrolled swelling and/or lack of appropriate compression garment    Secondary Functional Limitations:  Uncontrolled swelling can increase chances of  infection, and limit functional ROM     Patient Goals: Decrease swelling    Physical Therapy Goals:   In 1-3 months: Pt and/or caregiver will:  Be able to verbalize proper skin care, lymphedema risk reduction strategies, use and care of compression garments/devices x1 MET 08-16-21  Be able to demonstrate self MLD, donning/doffing garments to manage swelling x1 MET 08-16-21  Decrease limb volume by 1-3 % for proper fit of clothing and to minimize infection risk x 1    In 6-12 months: Pt and/or caregiver will:  Be independent with home management of self care related to lymphedema to reduce infection risk and recurrent hospitalizations  Retain an optimal limb volume that allows pt to fit into appropriate compression garments and clothing to improve functional use of  UE and QOL    Prognosis for goal achievement:   Good due to  Previous success .      PLAN:         Planned frequency and duration  of treatment:   1 x week x 10 weeks    Next Visit Plan: MLD, MFR, lymphatouch to right UE/trunk     SUBJECTIVE:  Patient???s communication preference: verbal, written, visual, prn   History of Present Illness/ Pt reports:  Two days post op, sore all over     Location of pain:  trunk and axilla   Current Pain:  3/10   Max pain:  3/10  Least pain:  0/10  Nature of pain: Intermittent    OBJECTIVE:      Posture/Observations:  Forward head, rounded shoulders , guarded posturing              Girth Measurements: taken in cm    Date: 2-28 2-28 3-23 3-23          Right Left RIght Left Right Left RIght Left RIght Left   MCP 18 18 17.6 17.2         Mid palm 20 19 19.8 17.5         Thumb DIP             1st finger DIP             1st finger PIP             2nd finger DIP             2nd finger PIP             3rd finger DIP             3rd finger PIP             4th finger DIP             4th finger PIP                          Axilla             Mid breast nipple line             Ribs below breast             Natural Waistline             Umbilicus             Hips at  cm below umbilicus             Limb Volume                        Location of swelling:  right hand , wrist, forearm, elbow, arm, shoulder, axilla, chest, breast and ribs    Skin condition:  Stemmer???s sign:no.  increased skin thickness  Incision/Scar:   incision healing well postoperatively        Total Treatment Time: 45 min    Treatment Rendered:    Patient and/or caregiver and therapist were mask compliant per current [place] COVID mask policy during the entire treatment session.          Manual x 45 min   MLD to appropriate anastomoses in order to decrease swelling.  Application of compression garment/devices prn to prevent re-accumulation of fluid.     Keep arms to 90 degrees, gentle massage to axillary cording ,  no sidelying   Equipment provided/recommended:   N/A    Communication/consultation with other professionals:  N/A    Referrals made to the following providers:  none    Medical Necessity: This treatment is medically necessary throughout the course of this patient's life during and after cancer treatments to improve functional activities and /or to minimize the decline of functional abilities and worsening of symptoms,including infection and recurrent hospitalizations, through independent/home management strategies. Lymphedema, a chronic, progressive condition for which there is no cure, is marked by the accumulation of protein-rich fluid in one or more quadrants of the body due to primary or secondary disruption of the lymphatic system.  The sustained accumulation results in tissue inflammation, an increase in fatty tissue, and development of obstructive connective tissue.  These changes may result in an increased risk of infection, disfigurement and a decrease in mobility and functional performance.   Pt will benefit from physical therapy by a certified lymphedema therapist to address : education in self care, precautions, and management of symptoms, optimal edema reduction/maintenance of girth, optimal soft tissue changes, fitting of appropriate compression garments, and progression of exercises to optimize functional ROM and strength, balance, and endurance in all ADL's(home, work, recreational, community).      I attest that I have reviewed the above information.  Signed: Clementeen Graham, PT   08/29/2021 1:04 PM

## 2021-09-02 MED ORDER — MONTELUKAST 10 MG TABLET
ORAL_TABLET | Freq: Every evening | ORAL | 0 refills | 30 days | Status: CP
Start: 2021-09-02 — End: 2021-10-02

## 2021-09-03 DIAGNOSIS — Z170 Estrogen receptor positive status [ER+]: Principal | ICD-10-CM

## 2021-09-03 DIAGNOSIS — C50911 Malignant neoplasm of unspecified site of right female breast: Principal | ICD-10-CM

## 2021-09-03 MED FILL — VERZENIO 100 MG TABLET: ORAL | 28 days supply | Qty: 56 | Fill #4

## 2021-09-04 ENCOUNTER — Ambulatory Visit: Admit: 2021-09-04 | Discharge: 2021-09-04 | Payer: PRIVATE HEALTH INSURANCE

## 2021-09-04 ENCOUNTER — Institutional Professional Consult (permissible substitution): Admit: 2021-09-04 | Discharge: 2021-09-04 | Payer: PRIVATE HEALTH INSURANCE

## 2021-09-04 ENCOUNTER — Other Ambulatory Visit: Admit: 2021-09-04 | Discharge: 2021-09-04 | Payer: PRIVATE HEALTH INSURANCE

## 2021-09-04 DIAGNOSIS — C50911 Malignant neoplasm of unspecified site of right female breast: Principal | ICD-10-CM

## 2021-09-04 DIAGNOSIS — Z170 Estrogen receptor positive status [ER+]: Principal | ICD-10-CM

## 2021-09-04 LAB — COMPREHENSIVE METABOLIC PANEL
ALBUMIN: 4 g/dL (ref 3.4–5.0)
ALKALINE PHOSPHATASE: 104 U/L (ref 46–116)
ALT (SGPT): 16 U/L (ref 10–49)
ANION GAP: 10 mmol/L (ref 5–14)
AST (SGOT): 23 U/L (ref <=34)
BILIRUBIN TOTAL: 0.4 mg/dL (ref 0.3–1.2)
BLOOD UREA NITROGEN: 17 mg/dL (ref 9–23)
BUN / CREAT RATIO: 16
CALCIUM: 9.3 mg/dL (ref 8.7–10.4)
CHLORIDE: 107 mmol/L (ref 98–107)
CO2: 24 mmol/L (ref 20.0–31.0)
CREATININE: 1.08 mg/dL — ABNORMAL HIGH
EGFR CKD-EPI (2021) FEMALE: 70 mL/min/{1.73_m2} (ref >=60)
GLUCOSE RANDOM: 89 mg/dL (ref 70–179)
POTASSIUM: 4.3 mmol/L (ref 3.4–4.8)
PROTEIN TOTAL: 7.5 g/dL (ref 5.7–8.2)
SODIUM: 141 mmol/L (ref 135–145)

## 2021-09-04 LAB — CBC W/ AUTO DIFF
BASOPHILS ABSOLUTE COUNT: 0 10*9/L (ref 0.0–0.1)
BASOPHILS RELATIVE PERCENT: 0.5 %
EOSINOPHILS ABSOLUTE COUNT: 0.1 10*9/L (ref 0.0–0.5)
EOSINOPHILS RELATIVE PERCENT: 3 %
HEMATOCRIT: 36 % (ref 34.0–44.0)
HEMOGLOBIN: 12.1 g/dL (ref 11.3–14.9)
LYMPHOCYTES ABSOLUTE COUNT: 1.2 10*9/L (ref 1.1–3.6)
LYMPHOCYTES RELATIVE PERCENT: 25.8 %
MEAN CORPUSCULAR HEMOGLOBIN CONC: 33.5 g/dL (ref 32.0–36.0)
MEAN CORPUSCULAR HEMOGLOBIN: 26.6 pg (ref 25.9–32.4)
MEAN CORPUSCULAR VOLUME: 79.3 fL (ref 77.6–95.7)
MEAN PLATELET VOLUME: 8 fL (ref 6.8–10.7)
MONOCYTES ABSOLUTE COUNT: 0.4 10*9/L (ref 0.3–0.8)
MONOCYTES RELATIVE PERCENT: 7.4 %
NEUTROPHILS ABSOLUTE COUNT: 3 10*9/L (ref 1.8–7.8)
NEUTROPHILS RELATIVE PERCENT: 63.3 %
PLATELET COUNT: 179 10*9/L (ref 150–450)
RED BLOOD CELL COUNT: 4.54 10*12/L (ref 3.95–5.13)
RED CELL DISTRIBUTION WIDTH: 14.6 % (ref 12.2–15.2)
WBC ADJUSTED: 4.8 10*9/L (ref 3.6–11.2)

## 2021-09-04 LAB — VITAMIN B12: VITAMIN B-12: 1743 pg/mL — ABNORMAL HIGH (ref 211–911)

## 2021-09-04 MED ADMIN — leuprolide (LUPRON) injection 3.75 mg: 3.75 mg | INTRAMUSCULAR | @ 16:00:00 | Stop: 2021-09-04

## 2021-09-04 NOTE — Unmapped (Signed)
[facility] [place]  OUTPATIENT PHYSICAL THERAPY  09/04/2021  Note Type: Treatment Note       Patient Name: Tami Gibson  Date of Birth:12/08/1990  Diagnosis:   Encounter Diagnoses   Name Primary?    Lymphedema syndrome, postmastectomy Yes    Scar condition and fibrosis of skin      Referring MD:  [name], MD     Date of Onset of Impairment-07/09/2021  Date PT Care Plan Established or Reviewed-07/30/2021  Date PT Treatment Started-07/30/2021       Session #:  5 of 11      Contraindications:  none  Precautions:  none  Red Flags:  history of cancer, right breast IB    Metastatic cancer:   no   Assessment/Progress toward goals:  Pt is progressing toward meeting goals, Patient's skin integrity is improving.  Skin is softening with treatment which allows for improved lymph uptake, and decreased risk of infection, and Pt is demonstrating continued limb/truncal swelling reduction and improve tissue integrity     ASSESSMENT/Reason for Referral:   31 y.o. 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  Pt is s/p right breast cancer with mastectomy and tissue expander, ALND and radiation.  The patient was treated for lymphedema and recently discharged.  She began to note increased swelling in her right arm and trunk in the past 3 weeks.  She is to undergo reconstruction surgery in 2 weeks, and is concerned about her lymphedema worsening.  She has been consistently excellent in self care.   .  In addition to lymphedema , the patient is experiencing these side effects from cancer treatments: Radiation: Fibrosis/myofascial restrictions  Currently the right limb is 6.44 % bigger than the left limb.     Recommendations for treatment/garments:  1) Patient will obtain proper garments to address swelling and improve tissue integrity. Garment recommendations :May need to increase to 30-40 mm Hg RTW sleeve/glove, or go into custom sleeve, either Elvarex or Mediven 550 if swelling does not reduce with treatments   2) Pt will be seen clinically for rehabilitation to include treatments to address as needed: edema/lymphedema, pain, ROM, strength, balance and endurance deficits, as well as learning self care strategies to address these deficits  3) Pt has  a pneumatic compression pump  Patient requires skilled Physical Therapy services  for the following problem list and secondary functional limitations:    Problem List:   Uncontrolled swelling and/or lack of appropriate compression garment    Secondary Functional Limitations:  Uncontrolled swelling can increase chances of  infection, and limit functional ROM     Patient Goals: Decrease swelling    Physical Therapy Goals:   In 1-3 months: Pt and/or caregiver will:    Decrease limb volume by 1-3 % for proper fit of clothing and to minimize infection risk x 1    In 6-12 months: Pt and/or caregiver will:  Be independent with home management of self care related to lymphedema to reduce infection risk and recurrent hospitalizations  Retain an optimal limb volume that allows pt to fit into appropriate compression garments and clothing to improve functional use of UE and QOL        PLAN:         Planned frequency and duration  of treatment:   1 x week x 10 weeks    Next Visit Plan: MLD, MFR, lymphatouch to right UE/trunk     SUBJECTIVE:  Patient???s communication preference: verbal, written,  visual, prn   History of Present Illness/ Pt reports:  Using arms more, no pain    Location of pain: no pain    OBJECTIVE:      Posture/Observations:  Forward head, rounded shoulders , guarded posturing              Girth Measurements: taken in cm    Date: 2-28 2-28 3-23 3-23          Right Left RIght Left Right Left RIght Left RIght Left   MCP 18 18 17.6 17.2         Mid palm 20 19 19.8 17.5         Thumb DIP             1st finger DIP             1st finger PIP             2nd finger DIP             2nd finger PIP             3rd finger DIP             3rd finger PIP             4th finger DIP             4th finger PIP                          Axilla             Mid breast nipple line             Ribs below breast             Natural Waistline             Umbilicus             Hips at  cm below umbilicus             Limb Volume                        Location of swelling:  right hand , wrist, forearm, elbow, arm, shoulder, axilla, chest, breast and ribs    Skin condition:  Stemmer???s sign:no.  increased skin thickness  Incision/Scar:   incision healing well postoperatively        Total Treatment Time: 53 min    Treatment Rendered:    Patient and/or caregiver and therapist were mask compliant per current [place] COVID mask policy during the entire treatment session.          Manual x 53 min   Axillary Web syndrome release , Myofascial release/Spontaneous Muscle Release Techniques, Lymphatouch to affected areas with simultaneous MFR, and MLD to appropriate anastomoses in order to decrease swelling.  Application of compression garment/devices prn to prevent re-accumulation of fluid.       Equipment provided/recommended:   N/A    Communication/consultation with other professionals:  N/A    Referrals made to the following providers:  none    Medical Necessity: This treatment is medically necessary throughout the course of this patient's life during and after cancer treatments to improve functional activities and /or to minimize the decline of functional abilities and worsening of symptoms,including infection and recurrent hospitalizations, through independent/home management strategies.  Lymphedema, a chronic, progressive condition for which there is no cure, is marked by the  accumulation of protein-rich fluid in one or more quadrants of the body due to primary or secondary disruption of the lymphatic system.  The sustained accumulation results in tissue inflammation, an increase in fatty tissue, and development of obstructive connective tissue.  These changes may result in an increased risk of infection, disfigurement and a decrease in mobility and functional performance.   Pt will benefit from physical therapy by a certified lymphedema therapist to address : education in self care, precautions, and management of symptoms, optimal edema reduction/maintenance of girth, optimal soft tissue changes, fitting of appropriate compression garments, and progression of exercises to optimize functional ROM and strength, balance, and endurance in all ADL's(home, work, recreational, community).      I attest that I have reviewed the above information.  Signed: Clementeen Graham, PT   09/04/2021 9:46 AM

## 2021-09-04 NOTE — Unmapped (Signed)
Breast Oncology Return Patient Evaluation  Referring Physician: Durenda Hurt, Md  [address]  [address]  [place],  [place] [address].  PCP: Durenda Hurt, MD  Breast Med/Onc: Adonis Brook, MD    Cancer Team  Surgical Oncology: Jobe Marker, MD  Radiation Oncology: Rayetta Humphrey, MD  Medical Oncology: Adonis Brook, MD  Plastic Surgery: Levan Hurst, MD  Genetics: Audree Camel, MD    Reason for Visit: A 31 y.o. female with breast cancer referred for consultation for recommendations concerning the management of breast cancer.    Assessment/Plan:    Cancer Staging   Malignant neoplasm of right breast in female, estrogen receptor positive (CMS-HCC)  Staging form: Breast, AJCC 8th Edition  - Clinical: Stage IB (cT1c, cN1, cM0, G1, ER+, PR+, HER2-) - Signed by [name], MD on 12/02/2019  - Pathologic stage from 04/20/2020: No Stage Recommended (ypT1c, pN1a, cM0, G2, ER+, PR+, HER2-) - Signed by [name], MD on 04/25/2020     #Stage IB (cT1c, cN1, cM0, G1, ER+, PR+, HER2-)right breast cancer    Neoadjuvant chemotherapy  Completed ddAC followed by DDTaxol on 03/21/20    Locoregional therapy  -s/p Right mastectomy with SLN and prophylactic left mastectomy  -Pathology consistent with residual disease, MD Dareen Piano residual cancer burden class: RCB-III  With 2/3 positive LNs    - ALND completed on 05/07/2020-1/16 lymph nodes involved. Total 3 LNs involved by metastatic adenocarcinoma  -Completed adjuvant RT on 08/06/2020  -Completed tissue expander exchange for permanent implants, liposuction from abdomen and fat grafting to bilateral breasts on 08/13/2021    Adjuvant therapy  Ovarian suppression (lupron) + Letrozole - SOT 08/09/2020  Adjuvant Abemaciclib - SOT 08/29/2020. Dose reduced to 100mg  BID in June 2022 to improve tolerance. She reports tolerating well.   -Continue Abemaciclib 100 mg BID     Staging scans  -Patient had baseline staging scans which are negative for any metastatic disease.   -Repeat scans on March 2022 showed no evidence of metastasis; ( this was completed in the setting of the patient needing reassurance)     #Hot flashes  -Patient endorses hot flashes since initiation of aromatase inhibition therapy; she reports taking vit E which has been helpful.   -Discussed pharmacologic options including oxybutynin, gabapentin, or cross-tapering escitalopram (managed by PCP for anxiety) for venlafaxine, but patient continues opts for no pharmacologic therapy at this time    #Syncope/Presyncope  -Patient has had multiple episodes of presyncope with tunnel vision and one episode of syncope with preceding tunnel vision and lightheadedness all occurring approximately 4 to 5 days after chemo infusions.  No positional component and she is trying to stay well-hydrated.   -Extensive work-up has been unrevealing, including labs, chest CTA, brain MRI, CT head.  TTE prior to start of chemotherapy showed structurally normal heart. EKG was also normal at the time and 2 week Zio-patch w/o evidence of any concerning arrhythmia  -Overall, presentation seems most consistent with vasovagal etiology, unlikely to be cardiogenic. No symptoms since she was [facility]  - Cardiology following - followup limited TTE unremarkable  -Resolved    # Bone health  -Baseline bone mineral density evaluation prior to starting an aromatase inhibitor was normal  -Repeat in March 2024      # Supportive care  - Psychosocial: Supportive mother and husband. Follows with [facility] and [facility]  -Heartburn: Daily PPI prescribed  -Hotflashes: as above  -Lymphedema: Continues to wear her compression sleeve and  follows with physical therapy.  Has taken a break from the compression sleeve after her recent surgery but she did note improvement since her fat grafting  -Diarrhea: Imodium as needed.  No complaints today      DISPO:  --Fu in 3 months, labs prior  --Continue lupron monthly with nurse visits    -----------------------------------------------------------------------------------------------------------------------------------------------    HPI: Tami Gibson is a 31 y.o. female who is seen for routine follow-up of management of breast cancer.     Interval History:  She is tolerating abemaciclib well at this reduced dose.  Had recent breast reconstruction and is very happy with the results.  She notes less lymphedema and less restriction in her right arm since the fat grafting      --Performance status=0    Breast Cancer History  Oncology History Overview Note   31 year old female with recently self palpated right breast lump. A targeted right breast ultrasound on 11/15/2019 showed an irregular mass with spiculated margins at the 7:00 position, 6 cm from the nipple, measuring 1.4 x 1.2 x 1.0 cm. There were 2 abnormal level 1 axillary lymph nodes measuring 1.4 x 1.3 cm and 1.2 x 0.9 cm. Additionally, there was a high level 1/level 2 axillary lymph node, measuring 2.1 x 1.3 cm with an effaced fatty hilum.      Malignant neoplasm of right breast in female, estrogen receptor positive (CMS-HCC)   11/09/2019 Initial Diagnosis    Malignant neoplasm of right breast in female, estrogen receptor positive (CMS-HCC)     11/15/2019 Biopsy    Invasive ductal carcinoma  - Nottingham combined histologic grade: 1  ER 91-100%, PR 91-100%, HER2 negative by IHC    Lymph node, right axilla, core biopsy  - Lymph node positive for metastatic carcinoma,      12/02/2019 -  Cancer Staged    Staging form: Breast, AJCC 8th Edition  - Clinical: Stage IB (cT1c, cN1, cM0, G1, ER+, PR+, HER2-) - Signed by [name], MD on 12/02/2019       12/09/2019 - 03/22/2020 Chemotherapy    OP BREAST AC (Dose Dense) q2W X 4 CYCLES, THEN PACLITAXEL (DOSE DENSE) Q2W x 4 CYCLES  DOXOrubicin 60 mg/m2 IV on day 1, cyclophosphamide 600 mg/m2 IV on day 1, every 2 weeks for 4 cycles, then PACLitaxel 175 mg/m2 every 2 weeks for 4 cycles 04/20/2020 Surgery    Breast, right, simple mastectomy  -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 Extracapsular extension is identified      Breast, left, simple mastectomy  -Benign breast tissue with no atypia, in situ or invasive carcinoma identified     04/20/2020 -  Cancer Staged    Staging form: Breast, AJCC 8th Edition  - Pathologic stage from 04/20/2020: No Stage Recommended (ypT1c, pN1a, cM0, G2, ER+, PR+, HER2-) - Signed by [name], MD on 04/25/2020       05/07/2020 Surgery    Lymph nodes, right axillary, dissection  -One of sixteen lymph nodes involved by metastatic adenocarcinoma (1/16)  -Metastatic focus measures 3 mm in greatest dimension  -No extracapsular extension is identified     06/28/2020 - 08/06/2020 Radiation    The total radiation dose will be 5000 cGy at 200 cGy/fraction for a total of 25 fractions, treated once a day     08/13/2020 Endocrine/Hormone Therapy  Letrozole 2.5mg  +Ovarian suppression (monthly lupron)     Malignant neoplasm of right breast in female, estrogen receptor positive (CMS-HCC)   05/07/2020 Surgery    Lymph nodes, right axillary, dissection  -One of sixteen lymph nodes involved by metastatic adenocarcinoma (1/16)  -Metastatic focus measures 3 mm in greatest dimension  -No extracapsular extension is identified     06/13/2020 -  Radiation    Radiation Therapy Treatment Details (Noted on 06/13/2020)  Site: Right Breast - Overlapping sites  Technique: 3D CRT  Goal: No goal specified  Planned Treatment Start Date: No planned start date specified     06/21/2020 -  Radiation    Radiation Therapy Treatment Details (Noted on 06/21/2020)  Site: Right Breast - Overlapping sites  Technique: 3D CRT  Goal: No goal specified  Planned Treatment Start Date: No planned start date specified         Past Medical History  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    [facility]    Gestational hypertension 01/30/2017    end of pregnancy had hypertension, was induced    Headache     migraine occasionally    Hypertension     during week 37 week, pt induced and had C/S due to hypertension    IIH (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/2015    followed by ophthalmologist    Kidney stone     Malignant neoplasm of right breast in female, 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/30/2019    Pyelonephritis 09/2016    Kidney stones    Urinary tract infection     Had 5-6 in Lifetime, esp high school and college age    Varicella     during childhood    Vitamin B 12 deficiency        Reproductive/GYN History  OB History   Gravida Para Term Preterm AB Living   2 2 2  0 0 2   SAB IAB Ectopic Molar Multiple Live Births   0 0 0 0 0 2      # Outcome Date GA Lbr Len/2nd Weight Sex Delivery Anes PTL Lv   2 Term 02/17/19 [redacted]w[redacted]d  3545 g (7 lb 13 oz) F CS-LTranv Spinal, EPI N LIV      Complications: Failure to Progress in First Stage      Name: Castner,CARTER RAE      Apgar1: 8  Apgar5: 9   1 Term 01/30/17 [redacted]w[redacted]d  2875 g (6 lb 5.4 oz) F CS-LTranv Spinal, EPI N LIV      Complications: Hypertension, Breech birth      Name: Bordonaro,OAKLEY      Apgar1: 8  Apgar5: 9      Obstetric Comments   OB-History reviewed by Lucilla Lame RN on 02/22/2019.   Gardasil series completed   Last pap:  2017; NIL   No abn paps   No hx STI's       Surgical History  Past Surgical History:   Procedure Laterality Date    BREAST BIOPSY Right     IDC    CESAREAN SECTION  01/30/2017    breech    CHEMOTHERAPY      IR INSERT PORT AGE GREATER THAN 5 YRS  12/08/2019    IR INSERT PORT AGE  GREATER THAN 5 YRS 12/08/2019 Jobe Gibbon, MD IMG VIR [facility]    LUMBAR PUNCTURE DIAGNOSTIC [facility] HISTORICAL RESULT)  01/2015    to relieve ICP    PR BX/REMV,LYMPH NODE,DEEP AXILL Right 04/20/2020    Procedure: BX/EXC LYMPH NODE; OPEN, DEEP AXILRY NODE;  Surgeon: Moss Mc, MD;  Location: MAIN OR [facility];  Service: Surgical Oncology Breast    PR CESAREAN DELIVERY ONLY N/A 01/30/2017    Procedure: CESAREAN DELIVERY ONLY;  Surgeon: Asher Muir, MD;  Location: L&D C-SECTION OR SUITES [facility];  Service: Maternal-Fetal Medicine    PR CESAREAN DELIVERY ONLY N/A 02/17/2019    Procedure: CESAREAN DELIVERY ONLY;  Surgeon: Lonny Prude, MD;  Location: L&D C-SECTION OR SUITES [facility];  Service: Maternal-Fetal Medicine    PR IMPLNT BIO IMPLNT FOR SOFT TISSUE REINFORCEMENT Bilateral 04/20/2020    Procedure: IMPLANTATION BIOLOGIC IMPLANT(EG, ACELLULAR DERMAL MATRIX) FOR SOFT TISSUE REINFORCEMENT(EG, BREAST, TRUNK);  Surgeon: Arsenio Katz, MD;  Location: MAIN OR [facility];  Service: Plastics    PR INTRAOPERATIVE SENTINEL LYMPH NODE ID W DYE INJECTION Right 04/20/2020    Procedure: INTRAOPERATIVE IDENTIFICATION SENTINEL LYMPH NODE(S) INCLUDE INJECTION NON-RADIOACTIVE DYE, WHEN PERFORMED;  Surgeon: Moss Mc, MD;  Location: MAIN OR [place];  Service: Surgical Oncology Breast    PR INTRAOPERATIVE SENTINEL LYMPH NODE ID W DYE INJECTION Right 05/07/2020    Procedure: INTRAOPERATIVE IDENTIFICATION SENTINEL LYMPH NODE(S) INCLUDE INJECTION NON-RADIOACTIVE DYE, WHEN PERFORMED;  Surgeon: Moss Mc, MD;  Location: MAIN OR [place];  Service: Surgical Oncology Breast    PR MASTECTOMY, SIMPLE, COMPLETE Bilateral 04/20/2020    Procedure: MASTECTOMY, SIMPLE, COMPLETE;  Surgeon: Moss Mc, MD;  Location: MA[facility] OR [facility];  Service: Surgical Oncology Breast    PR REMOVE ARMPITS LYMPH NODES COMPLT Right 05/07/2020    Procedure: AXILLARY LYMPHADENECTOMY; COMPLETE;  Surgeon: Moss Mc, MD;  Location: MAIN OR [place];  Service: Surgical Oncology Breast    PR REPLACEMENT TISSUE EXPANDER W/PERMANENT IMPLANT Bilateral 08/13/2021    Procedure: REPLACEMENT OF TISSUE EXPANDER WITH PERMANENT IMPLANT;  Surgeon: Arsenio Katz, MD;  Location: MAIN OR [place];  Service: Plastics    PR TISSUE EXPANDER PLACEMENT BREAST RECONSTRUCTION Bilateral 04/20/2020    Procedure: TISSUE EXPANDER PLACEMENT IN BREAST RECONSTRUCTION, INCLUDING SUBSEQUENT EXPANSION(S);  Surgeon: Arsenio Katz, MD;  Location: MAIN OR [place];  Service: Plastics    TONSILECTOMY, ADENOIDECTOMY, BILATERAL MYRINGOTOMY AND TUBES      WISDOM TOOTH EXTRACTION  2004       Medications    Current Outpatient Medications:     abemaciclib (VERZENIO) 100 mg Tab tablet, Take 1 tablet (100 mg total) by mouth Two (2) times a day. Take with or without food., Disp: 56 tablet, Rfl: 5    acetaminophen (TYLENOL) 325 MG tablet, Take 2 tablets (650 mg total) by mouth every six (6) hours as needed for pain., Disp: , Rfl:     calcium carbonate (OS-CAL) 500 mg calcium (1,250 mg) chewable tablet, Chew., Disp: , Rfl:     calcium carbonate (TUMS) 200 mg calcium (500 mg) chewable tablet, Chew 1 tablet (200 mg of elem calcium total) daily., Disp: , Rfl:     cholecalciferol, vitamin D3-25 mcg, 1,000 unit,, 25 mcg (1,000 unit) capsule, Take 1 capsule (25 mcg total) by mouth., Disp: , Rfl:     cyanocobalamin 100 MCG tablet, Take 1 tablet (100 mcg total) by mouth daily., Disp: , Rfl:     docusate sodium (COLACE) 100 MG capsule, Take  1 capsule (100 mg total) by mouth nightly as needed (PRN)., Disp: , Rfl:     ergocalciferol-1,250 mcg, 50,000 unit, (DRISDOL) 1,250 mcg (50,000 unit) capsule, Take 1 capsule (1,250 mcg total) by mouth once a week., Disp: , Rfl:     escitalopram oxalate (LEXAPRO) 20 MG tablet, Take 1 tablet (20 mg total) by mouth daily., Disp: 90 tablet, Rfl: 3    fexofenadine (ALLEGRA) 180 MG tablet, Take 1 tablet (180 mg total) by mouth., Disp: , Rfl:     letrozole (FEMARA) 2.5 mg tablet, Take 1 tablet by mouth once daily, Disp: 90 tablet, Rfl: 0    leuprolide (LUPRON) 3.75 mg injection, Inject 3.75 mg into the muscle., Disp: , Rfl:     montelukast (SINGULAIR) 10 mg tablet, Take 1 tablet (10 mg total) by mouth nightly., Disp: 30 tablet, Rfl: 0    omeprazole (PRILOSEC) 20 MG capsule, Take 1 capsule by mouth once daily, Disp: 90 capsule, Rfl: 0    ondansetron (ZOFRAN-ODT) 4 MG disintegrating tablet, Take 1 tablet (4 mg total) by mouth every eight (8) hours as needed for nausea., Disp: 30 tablet, Rfl: 3    rimegepant (NURTEC ODT) 75 mg TbDL, Take 1 tablet (75 mg total) by mouth., Disp: , Rfl:     semaglutide (OZEMPIC) 0.25 mg or 0.5 mg(2 mg/1.5 mL) PnIj injection, Inject 0.25 mg under the skin every seven (7) days., Disp: 1.5 mL, Rfl: 3    SUMAtriptan (IMITREX) 100 MG tablet, TAKE ONE TABLET BY MOUTH AT ONSET OF MIGRAINE FOR UP TO 1 DOSE . IF SYMPTOMS PERSIST A SECOND DOSE MAY BE TAKEN IN 2 HOURS IF NEEDED, Disp: , Rfl:     topiramate (TOPAMAX) 25 MG tablet, TAKE 3 TABLETS BY MOUTH ONCE DAILY, Disp: , Rfl:     traZODone (DESYREL) 50 MG tablet, Take 0.5-1 tablets (25-50 mg total) by mouth nightly as needed for sleep. (for sleep), Disp: 30 tablet, Rfl: 5    vitamin E-134 mg, 200 UNIT, 134 mg (200 UNIT) capsule, Take 1 capsule (134 mg total) by mouth daily., Disp: , Rfl:   No current facility-administered medications for this visit.    Facility-Administered Medications Ordered in Other Visits:     leuprolide (LUPRON) 3.75 mg injection, , , ,     Allergies  No Known Allergies    Personal and Social History  Social History     Social History Narrative    PRECONCEPTUAL ASSESSMENT:        Reneisha is a 31 y.o. Caucasian female    Caffeine:denies use    Cats: no    Exercise: not active    Diet: balanced    Family hx of of birth defects, chromosomal abnormality, intellectual disability, developmental delay: no.            PARTNER HISTORY:     Domingo Dimes is a 77 y.o.  Caucasian female.    Occupation:  Immunologist; [employer]    He has fathered children:  no    He is healthy with no chronic illness    Family history of  birth defects, chromosomal abnormality, intellectual disability, or developmental delay: no.                Family History  Family History   Problem Relation Age of Onset    Cancer Maternal Aunt 45        breast Ca    Hypertension Father     Other Father 32  Benign brain tumor    Hypothyroidism Mother     Cancer Paternal Aunt         Breast Ca dx in 55s    ALS Maternal Aunt     Ovarian cancer Maternal Grandmother 59    No Known Problems Maternal Grandfather     Cancer Paternal Grandmother         Unknown Cancer    No Known Problems Paternal Grandfather     No Known Problems Maternal Aunt     No Known Problems Maternal Aunt     Breast cancer Maternal Cousin         Dx in 46s    No Known Problems Paternal Aunt     No Known Problems Daughter     No Known Problems Daughter     No Known Problems Other     BRCA 1/2 Neg Hx     Colon cancer Neg Hx     Endometrial cancer Neg Hx          Review of Systems: A 12-system review of systems was obtained including: Constitutional, Eyes, ENT, Cardiovascular, Respiratory, GI, GU, Musculoskeletal, Skin, Neurological, Psychiatric, Endocrine, Heme/Lymphatic, and Allergic/Immunologic systems. It is negative or non-contributory to the patient???s management except for the following: See Interval History.    Physical Examination:  Vital Signs: BP 121/71  - Pulse 80  - Temp 36.3 ??C (97.3 ??F) (Temporal)  - Resp 16  - Ht 167.9 cm (5' 6.1)  - Wt (!) 104.7 kg (230 lb 13.2 oz)  - SpO2 99%  - BMI 37.14 kg/m??   General:  Healthy-appearing female in no acute distress..  Cardiovasc:  No heaves, regular, no additional sounds. No lower extremity edema.    Respiratory:  Chest clear to percussion and auscultation, unlabored  Gastrointestinal:  Soft, nontender, no hepatomegaly.   Musculoskeletal:  No bony pain or tenderness.   Skin and Subcutaneous Tissues: No significant rash.  Psychiatric: Mood is normal.  No other symptoms.   Neuro:  Alert and oriented.  Gait and coordination normal  Breasts: Bilateral mastectomies with implants in place. Well healed scars.  Heme/Lymphatic/Immunologic: No supraclavicular or cervical adenopathy    I have personally reviewed the following diagnostic studies:    Breast imaging and pathology reviewed. See Results for details

## 2021-09-04 NOTE — Unmapped (Signed)
Pt received lupron 3.75mg  injection in right ventroGluteus. Tolerated it well. Applied guaze and band aid.

## 2021-09-04 NOTE — Unmapped (Signed)
It was a pleasure to see you today in the Breast Medical Oncology Clinic.      For clinical concerns during working hours, please call [phone]. My nurse navigator is Barbara Kreitzer, RN    For clinical trial questions please call the study coordinator.     For emergencies, evenings or weekends, please call [phone] and ask for the oncology fellow on call.  Reasons to call the emergency line may include:  - Fever of 100.5 or greater  - Nausea and/or vomiting not relieved with nausea medicine  - Diarrhea or constipation not relieved with bowel regimen  - Severe pain not relieved with usual pain regimen

## 2021-09-10 ENCOUNTER — Ambulatory Visit
Admit: 2021-09-10 | Discharge: 2021-10-05 | Payer: PRIVATE HEALTH INSURANCE | Attending: Rehabilitative and Restorative Service Providers" | Primary: Rehabilitative and Restorative Service Providers"

## 2021-09-10 ENCOUNTER — Ambulatory Visit
Admit: 2021-09-10 | Payer: PRIVATE HEALTH INSURANCE | Attending: Rehabilitative and Restorative Service Providers" | Primary: Rehabilitative and Restorative Service Providers"

## 2021-09-10 NOTE — Unmapped (Signed)
CC: Pre-op    HPI:  Tami Gibson is a 31 y.o. 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 Teola Bradley) and immediate reconstruction with tissue expander placement on 04/20/20. Patients final pathology revealed residual disease with 2/3 positive lymph nodes and she underwent ALND with Dr. Teola Bradley on 05/07/20. She is on letrozole. She underwent adjuvant radiation (completed 08/06/20). She was originally interested in DIEP flap reconstruction but did not meet the BMI criteria. She underwent tissue expander to implant exchange with fat grafting to the bilateral breasts from the abdomen on 08/13/20.     Since her last visit, she has continued to do well since her last visit. She is worried about a spitting suture of the right incision. She is really happy with the outcome on the left and is hopeful that the right side will settle out to match the left. She feels like the axillary mobility (as does her PT) has improved since the surgery.  She has continued with binder and restrictions. She has transitioned to wearing a bralette. She has been working on implant massage twice a day on the right.     Past Medical History:  Past Medical History:   Diagnosis Date   ??? Acne     started during teen yrs., Accutane 2008-2009 during college yrs   ??? Anemia     During teen years only, no anemia recently   ??? 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 Gestational hypertension 01/30/2017    end of pregnancy had hypertension, was induced   ??? Headache     migraine occasionally   ??? Hypertension     during week 37 week, pt induced and had C/S due to hypertension   ??? 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 Kidney stone    ??? Malignant neoplasm of right breast in female, 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/30/2019   ??? Pyelonephritis 09/2016    Kidney stones   ??? Urinary tract infection     Had 5-6 in Lifetime, esp high school and college age   ??? Varicella     during childhood   ??? Vitamin B 12 deficiency        Past Surgical History:  Past Surgical History:   Procedure Laterality Date   ??? BREAST BIOPSY Right     IDC   ??? CESAREAN SECTION  01/30/2017    breech   ??? CHEMOTHERAPY     ??? IR INSERT PORT AGE GREATER THAN 5 YRS  12/08/2019    IR INSERT PORT AGE GREATER THAN 5 YRS 12/08/2019 Jobe Gibbon, MD IMG VIR H&V [facility]   ??? LUMBAR PUNCTURE DIAGNOSTIC [facility] HISTORICAL RESULT)  01/2015    to relieve ICP   ??? PR BX/REMV,LYMPH NODE,DEEP AXILL Right 04/20/2020    Procedure: BX/EXC LYMPH NODE; OPEN, DEEP AXILRY NODE;  Surgeon: Moss Mc, MD;  Location[facility]IN [facility];  Service: Surgical Oncology Breast   ??? PR CESAREAN DELIVERY ONLY N/A 01/30/2017    Procedure: CESAREAN DELIVERY ONLY;  Surgeon: Asher Muir, MD;  Location: L&D C-SECTION OR SUITES [facility];  Service: Maternal-Fetal Medicine   ??? PR CESAREAN DELIVERY ONLY N/A 02/17/2019    Procedure: CESAREAN DELIVERY ONLY;  Surgeon: Lonny Prude, MD;  Location: L&D C-SECTION OR SUITES [facility];  Service: Maternal-Fetal Medicine   ??? PR IMPLNT BIO IMPLNT  FOR SOFT TISSUE REINFORCEMENT Bilateral 04/20/2020    Procedure: IMPLANTATION BIOLOGIC IMPLANT(EG, ACELLULAR DERMAL MATRIX) FOR SOFT TISSUE REINFORCEMENT(EG, BREAST, TRUNK);  Surgeon: Arsenio Katz, MD;  Location: MAIN OR [facility];  Service: Plastics   ??? PR INTRAOPERATIVE SENTINEL LYMPH NODE ID W DYE INJECTION Right 04/20/2020    Procedure: INTRAOPERATIVE IDENTIFICATION SENTINEL LYMPH NODE(S) INCLUDE INJECTION NON-RADIOACTIVE DYE, WHEN PERFORMED;  Surgeon: Moss Mc, MD;  Location: MAIN OR [place];  Service: Surgical Oncology Breast   ??? PR INTRAOPERATIVE SENTINEL LYMPH NODE ID W DYE INJECTION Right 05/07/2020    Procedure: INTRAOPERATIVE IDENTIFICATION SENTINEL LYMPH NODE(S) INCLUDE INJECTION NON-RADIOACTIVE DYE, WHEN PERFORMED;  Surgeon: Moss Mc, MD;  Location: MAIN OR [place];  Service: Surgical Oncology Breast   ??? PR MASTECTOMY, SIMPLE, COMPLETE Bilateral 04/20/2020    Procedure: MASTECTOMY, SIMPLE, COMPLETE;  Surgeon: Moss Mc, MD;  Location: MAIN OR [place];  Service: Surgical Oncology Breast   ??? PR REMOVE ARMPITS LYMPH NODES COMPLT Right 05/07/2020    Procedure: AXILLARY LYMPHADENECTOMY; COMPLETE;  Surgeon: Moss Mc, MD;  [facility];  Service: Surgical Oncology Breast   ??? PR REPLACEMENT TISSUE EXPANDER W/PERMANENT IMPLANT Bilateral 08/13/2021    Procedure: REPLACEMENT OF TISSUE EXPANDER WITH PERMANENT IMPLANT;  Surgeon: Arsenio Katz, MD;  Location: MAIN OR [facility];  Service: Plastics   ??? PR TISSUE EXPANDER PLACEMENT BREAST RECONSTRUCTION Bilateral 04/20/2020    Procedure: TISSUE EXPANDER PLACEMENT IN BREAST RECONSTRUCTION, INCLUDING SUBSEQUENT EXPANSION(S);  Surgeon: Arsenio Katz, MD;  Location: MAIN OR [facility];  Service: Plastics   ??? TONSILECTOMY, ADENOIDECTOMY, BILATERAL MYRINGOTOMY AND TUBES     ??? WISDOM TOOTH EXTRACTION  2004       Medications:  Current Outpatient Medications   Medication Sig Dispense Refill   ??? abemaciclib (VERZENIO) 100 mg Tab tablet Take 1 tablet (100 mg total) by mouth Two (2) times a day. Take with or without food. 56 tablet 5   ??? acetaminophen (TYLENOL) 325 MG tablet Take 2 tablets (650 mg total) by mouth every six (6) hours as needed for pain.     ??? calcium carbonate (OS-CAL) 500 mg calcium (1,250 mg) chewable tablet Chew.     ??? calcium carbonate (TUMS) 200 mg calcium (500 mg) chewable tablet Chew 1 tablet (200 mg of elem calcium total) daily.     ??? cholecalciferol, vitamin D3-25 mcg, 1,000 unit,, 25 mcg (1,000 unit) capsule Take 1 capsule (25 mcg total) by mouth.     ??? cyanocobalamin 100 MCG tablet Take 1 tablet (100 mcg total) by mouth daily.     ??? docusate sodium (COLACE) 100 MG capsule Take 1 capsule (100 mg total) by mouth nightly as needed (PRN).     ??? ergocalciferol-1,250 mcg, 50,000 unit, (DRISDOL) 1,250 mcg (50,000 unit) capsule Take 1 capsule (1,250 mcg total) by mouth once a week.     ??? escitalopram oxalate (LEXAPRO) 20 MG tablet Take 1 tablet (20 mg total) by mouth daily. 90 tablet 3   ??? fexofenadine (ALLEGRA) 180 MG tablet Take 1 tablet (180 mg total) by mouth.     ??? letrozole (FEMARA) 2.5 mg tablet Take 1 tablet by mouth once daily 90 tablet 0   ??? leuprolide (LUPRON) 3.75 mg injection Inject 3.75 mg into the muscle.     ??? montelukast (SINGULAIR) 10 mg tablet Take 1 tablet (10 mg total) by mouth nightly. 30 tablet 0   ??? omeprazole (PRILOSEC) 20 MG capsule Take 1 capsule by mouth once daily 90 capsule  0   ??? ondansetron (ZOFRAN-ODT) 4 MG disintegrating tablet Take 1 tablet (4 mg total) by mouth every eight (8) hours as needed for nausea. 30 tablet 3   ??? rimegepant (NURTEC ODT) 75 mg TbDL Take 1 tablet (75 mg total) by mouth.     ??? semaglutide (OZEMPIC) 0.25 mg or 0.5 mg(2 mg/1.5 mL) PnIj injection Inject 0.25 mg under the skin every seven (7) days. 1.5 mL 3   ??? SUMAtriptan (IMITREX) 100 MG tablet TAKE ONE TABLET BY MOUTH AT ONSET OF MIGRAINE FOR UP TO 1 DOSE . IF SYMPTOMS PERSIST A SECOND DOSE MAY BE TAKEN IN 2 HOURS IF NEEDED     ??? topiramate (TOPAMAX) 25 MG tablet TAKE 3 TABLETS BY MOUTH ONCE DAILY     ??? traZODone (DESYREL) 50 MG tablet Take 0.5-1 tablets (25-50 mg total) by mouth nightly as needed for sleep. (for sleep) 30 tablet 5   ??? vitamin E-134 mg, 200 UNIT, 134 mg (200 UNIT) capsule Take 1 capsule (134 mg total) by mouth daily.       No current facility-administered medications for this visit.     Facility-Administered Medications Ordered in Other Visits   Medication Dose Route Frequency Provider Last Rate Last Admin   ??? leuprolide (LUPRON) 3.75 mg injection                Allergies:  No Known Allergies    Family History:   Family History   Problem Relation Age of Onset   ??? Cancer Maternal Aunt 45        breast Ca   ??? Hypertension Father    ??? Other Father 55        Benign brain tumor   ??? Hypothyroidism Mother    ??? Cancer Paternal Aunt         Breast Ca dx in 18s   ??? ALS Maternal Aunt    ??? Ovarian cancer Maternal Grandmother 46   ??? No Known Problems Maternal Grandfather    ??? Cancer Paternal Grandmother         Unknown Cancer   ??? No Known Problems Paternal Grandfather    ??? No Known Problems Maternal Aunt    ??? No Known Problems Maternal Aunt    ??? Breast cancer Maternal Cousin         Dx in 24s   ??? No Known Problems Paternal Aunt    ??? No Known Problems Daughter    ??? No Known Problems Daughter    ??? No Known Problems Other    ??? BRCA 1/2 Neg Hx    ??? Colon cancer Neg Hx    ??? Endometrial cancer Neg Hx     The patient reports no family history for bleeding disorders or anesthetic problems.    Social History:  Social History     Socioeconomic History   ??? Marital status: Married     Spouse name: Dylan   ??? Number of children: 0   Occupational History   ??? Occupation: Recreational therapist    Tobacco Use   ??? Smoking status: Never   ??? Smokeless tobacco: Never   Vaping Use   ??? Vaping Use: Never used   Substance and Sexual Activity   ??? Alcohol use: Not Currently   ??? Drug use: No   ??? Sexual activity: Yes     Partners: Male     Birth control/protection: Pill   Other Topics Concern   ??? Exercise Yes   ???  Living Situation No   Social History Narrative    PRECONCEPTUAL ASSESSMENT:        Tami Gibson is a 31 y.o. Caucasian female    Caffeine:denies use    Cats: no    Exercise: not active    Diet: balanced    Family hx of of birth defects, chromosomal abnormality, intellectual disability, developmental delay: no.            PARTNER HISTORY:     Domingo Dimes is a 77 y.o.  Caucasian female.    Occupation:  Immunologist; [employer]    He has fathered children:  no    He is healthy with no chronic illness    Family history of  birth defects, chromosomal abnormality, intellectual disability, or developmental delay: no.                ROS:   Otherwise, 12 point review of systems was completed and is negative except as per HPI. Vitals:   Vitals:    09/12/21 0802   BP: 119/81   Pulse: 95   Resp: 16   Temp: 36.1 ??C (97 ??F)   SpO2: 97%        Gen: AA in NAD  HEENT: NCAT, EOMI, PERRL, Non icteric sclerae, MMM  CV: RRR  RESP: quiet respirations on room air  EXT: warm and well perfused, compression garment to RUE  ABDOMEN: Soft, redundancy noted.  No obvious contour irregularity. Incisions well healed.   BREASTS: Some improved mobility on the right breast.  Softer.  Improved symmetry but still with some fullness superiorly on the right.  Right lateral breast with a linear tether (improved from preop)  IMF location improved.  Projection similar bilaterally.    Impression and Plan:  Tami Gibson is here today for a post op s/p tissue expander to permanent implant (650 SCX) with liposuction from the abdomen and fat grafting to bilateral breasts 08/13/21. Doing well.     -Spitting suture trimmed   -Continue binder for another 2 weeks   -Can sleep on side in 2 weeks   -Continue implant massage twice a day on the right   -Follow up in 4- 6 weeks     ______________________________________________________________________    Documentation assistance was provided by Marga Melnick, Scribe, on September 12, 2021 at 8:58 AM for Victorino Dike C. Lafayette Dragon, MD    The documentation recorded by the scribe accurately reflects the service I personally performed and the decisions made by me Janet Berlin.

## 2021-09-10 NOTE — Unmapped (Signed)
[facility] THERAPY SERVICES [place]  OUTPATIENT PHYSICAL THERAPY  09/10/2021  Note Type: Treatment Note       Patient Name: Tami Gibson  Date of Birth:07-May-1991  Diagnosis:   Encounter Diagnoses   Name Primary?    Lymphedema syndrome, postmastectomy Yes    Scar condition and fibrosis of skin     Malignant neoplasm of right breast in female, estrogen receptor positive, unspecified site of breast (CMS-HCC)      Referring MD:  [name], MD     Date of Onset of Impairment-07/09/2021  Date PT Care Plan Established or Reviewed-07/30/2021  Date PT Treatment Started-07/30/2021       Session #:  6 of 11      Contraindications:  none  Precautions:  none  Red Flags:  history of cancer, right breast IB    Metastatic cancer:   no   Assessment/Progress toward goals:  Pt is progressing toward meeting goals, Patient's skin integrity is improving.  Skin is softening with treatment which allows for improved lymph uptake, and decreased risk of infection, and Pt is demonstrating continued limb/truncal swelling reduction and improve tissue integrity Some right pectoralis tightness,  mild truncal swelling in lateral trunk noted.      ASSESSMENT/Reason for Referral:   31 y.o. 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  Pt is s/p right breast cancer with mastectomy and tissue expander, ALND and radiation.  The patient was treated for lymphedema and recently discharged.  She began to note increased swelling in her right arm and trunk in the past 3 weeks.  She is to undergo reconstruction surgery in 2 weeks, and is concerned about her lymphedema worsening.  She has been consistently excellent in self care.   .  In addition to lymphedema , the patient is experiencing these side effects from cancer treatments: Radiation: Fibrosis/myofascial restrictions  Currently the right limb is 6.44 % bigger than the left limb.     Recommendations for treatment/garments:  1) Patient will obtain proper garments to address swelling and improve tissue integrity. Garment recommendations :May need to increase to 30-40 mm Hg RTW sleeve/glove, or go into custom sleeve, either Elvarex or Mediven 550 if swelling does not reduce with treatments   2) Pt will be seen clinically for rehabilitation to include treatments to address as needed: edema/lymphedema, pain, ROM, strength, balance and endurance deficits, as well as learning self care strategies to address these deficits  3) Pt has  a pneumatic compression pump  Patient requires skilled Physical Therapy services  for the following problem list and secondary functional limitations:    Problem List:   Uncontrolled swelling and/or lack of appropriate compression garment    Secondary Functional Limitations:  Uncontrolled swelling can increase chances of  infection, and limit functional ROM     Patient Goals: Decrease swelling    Physical Therapy Goals:   In 1-3 months: Pt and/or caregiver will:    Decrease limb volume by 1-3 % for proper fit of clothing and to minimize infection risk x 1    In 6-12 months: Pt and/or caregiver will:  Be independent with home management of self care related to lymphedema to reduce infection risk and recurrent hospitalizations  Retain an optimal limb volume that allows pt to fit into appropriate compression garments and clothing to improve functional use of UE and QOL        PLAN:         Planned frequency and duration  of  treatment:   1 x week x 10 weeks    Next Visit Plan: MLD, MFR, lymphatouch to right UE/trunk, may start ROM/strength if cleared by surgeon      SUBJECTIVE:  Patient???s communication preference: verbal, written, visual, prn   History of Present Illness/ Pt reports:  May be able to start more activities, I see the doctor this week    Location of pain: no pain    OBJECTIVE:      Posture/Observations:  Forward head, rounded shoulders , guarded posturing              Girth Measurements: taken in cm    Date: 2-28 2-28 3-23 3-23          Right Left RIght Left Right Left RIght Left RIght Left   MCP 18 18 17.6 17.2         Mid palm 20 19 19.8 17.5         Thumb DIP             1st finger DIP             1st finger PIP             2nd finger DIP             2nd finger PIP             3rd finger DIP             3rd finger PIP             4th finger DIP             4th finger PIP                          Axilla             Mid breast nipple line             Ribs below breast             Natural Waistline             Umbilicus             Hips at  cm below umbilicus             Limb Volume                        Location of swelling:  right hand , wrist, forearm, elbow, arm, shoulder, axilla, chest, breast and ribs    Skin condition:  Stemmer???s sign:no.  increased skin thickness  Incision/Scar:   incision healing well postoperatively        Total Treatment Time: 53 min    Treatment Rendered:    Patient and/or caregiver and therapist were mask compliant per current [place] COVID mask policy during the entire treatment session.          Manual x 53 min   Axillary Web syndrome release , Myofascial release/Spontaneous Muscle Release Techniques, Lymphatouch to affected areas with simultaneous MFR, and MLD to appropriate anastomoses in order to decrease swelling.  Application of compression garment/devices prn to prevent re-accumulation of fluid.       Equipment provided/recommended:   N/A    Communication/consultation with other professionals:  N/A    Referrals made to the following providers:  none    Medical Necessity: This treatment is medically necessary throughout the course of this  patient's life during and after cancer treatments to improve functional activities and /or to minimize the decline of functional abilities and worsening of symptoms,including infection and recurrent hospitalizations, through independent/home management strategies.  Lymphedema, a chronic, progressive condition for which there is no cure, is marked by the accumulation of protein-rich fluid in one or more quadrants of the body due to primary or secondary disruption of the lymphatic system.  The sustained accumulation results in tissue inflammation, an increase in fatty tissue, and development of obstructive connective tissue.  These changes may result in an increased risk of infection, disfigurement and a decrease in mobility and functional performance.   Pt will benefit from physical therapy by a certified lymphedema therapist to address : education in self care, precautions, and management of symptoms, optimal edema reduction/maintenance of girth, optimal soft tissue changes, fitting of appropriate compression garments, and progression of exercises to optimize functional ROM and strength, balance, and endurance in all ADL's(home, work, recreational, community).      I attest that I have reviewed the above information.  Signed: Clementeen Graham, PT   09/10/2021 10:43 AM

## 2021-09-11 NOTE — Unmapped (Signed)
APPOINTMENTS / QUESTIONS  For questions regarding appointments or your surgical care, Monday through Friday, 8 am to 4:30 pm    [facility]  [address],   [place] [address]  T: [phone]  F: [phone]    Ambulatory Surgery Center  1st Floor, Ambulatory Care Center  [address]  [place], [place] [address]  T: [phone]  Website:  [facility]    [facility]  [address], [place], [place] [address]  Ground Floor [facility]  T: [phone]  F: [phone]    [facility]  [address], [place], [place] [address]  T: [phone]  F: [phone]    [facility]  [address], [place], [place] [address]   1st Floor [facility]  T: [phone]  F: [phone]    [facility]  [address], [place], [place] [address]  Third Floor, [address]  T: [phone]   F: [phone]    Administrative Office  [address]  [place], [place] [address]  T:  [phone]  F:  [phone]      Physicians:  Tanner Yeley Jeni Carr MD:   Nurse:  Skai Lickteig BSN, RN [phone]    Surgery Scheduler: Cristina Etchart Martin  [phone]     Lynn Damitz MD:   Nurse:  Nicole Bailey BSN, RN, CPSN [phone]  Surgery Scheduler:   Cristina Etchart Martin  [phone]     Adeyemi Yemi Ogunleye MD:  Nurse: Jacquelyn Eron BSN, RN, BC  [phone]  Surgery Scheduler: Kina Williamson [phone]    Jeyhan Wood MD:  Nurse: Rachel Heller BSN, RN, CPSN  [phone]  Surgery Scheduler: Kina Williamson [phone]    Advance Practice Providers:  Renee E Edkins, DNP, NP-C:   Surgery Scheduler: Kina Williamson [phone]     Payton Leonhardt, PA-C:   Surgery Scheduler: Kina Williamson [phone]     Financial Counselor:   Timothy Watson  T: [phone]  F: [phone]    Billing Questions/Financial Navigation:  [phone]  [facility] Customer Service Call Center: [phone]    Surgery Scheduling:  You will receive a phone call from the surgery schedulers regarding date for surgery in 1-2 weeks from  your appointment.    Please call one of the above numbers if you have not received a phone call 2 weeks after your appointment with the provider.      [facility] Contact Information:   Please call to schedule  *Radiology (CT, X-ray, Ultrasound) [phone]  *Mammography & Breast Ultrasound [phone]  *MRI [phone]  *Interventional Radiology [phone]    AFTER HOURS/HOLIDAYS:  For emergencies after-hours (after 5pm, or weekends): call the [facility] operator ([phone]) and ask to page the Plastic Surgery resident on call. You will be directed to a surgery resident who likely is not immediately aware of the details of your case, but can help you deal with any emergencies that cannot wait until regular business hours.  Please be aware that this person is responding to many in-hospital emergencies and patient issues and may not answer your phone call immediately, but will return your call as soon as possible.    Precare Location:  [facility] [place] (Formerly [facility])   [address]  [place],  [address] ([place] - [place] [facility] near [address])    Blood Work Location:  [address], [place] [address]. There are   no suite numbers, but it is located on the first floor there. Go to the main desk and tell them you are there for walk-in labs.  Precare:   The day prior to your scheduled surgery, [facility] will call you with instructions.  If you have not heard from them by 4PM, and would like to check on the status of your surgery, please call:  [facility]: [phone]  [facility]: [phone]  [facility]: [phone]  [place]:  [phone]    Insurance Denials:    All appeal initiation needs to be started by the patient.  We do not appeal insurance denials.     [place] Hotline:  [phone]    FLMA forms and paperwork:  Please allow  a 2 week turn around for forms to be completed and faxed.

## 2021-09-12 ENCOUNTER — Ambulatory Visit: Admit: 2021-09-12 | Discharge: 2021-09-13 | Payer: PRIVATE HEALTH INSURANCE

## 2021-09-12 DIAGNOSIS — Z9889 Other specified postprocedural states: Principal | ICD-10-CM

## 2021-09-18 NOTE — Unmapped (Signed)
[facility] THERAPY SERVICES [place]  OUTPATIENT PHYSICAL THERAPY  09/18/2021  Note Type: Treatment Note       Patient Name: Tami Gibson  Date of Birth:10/29/1990  Diagnosis:   Encounter Diagnoses   Name Primary?    Lymphedema syndrome, postmastectomy Yes    Scar condition and fibrosis of skin     Malignant neoplasm of right breast in female, estrogen receptor positive, unspecified site of breast (CMS-HCC)      Referring MD:  [name], MD     Date of Onset of Impairment-07/09/2021  Date PT Care Plan Established or Reviewed-07/30/2021  Date PT Treatment Started-07/30/2021       Session #:  7 of 11      Contraindications:  none  Precautions:  none  Red Flags:  history of cancer, right breast IB    Metastatic cancer:   no   Assessment/Progress toward goals:  Pt is progressing toward meeting goals, Patient's skin integrity is improving.  Skin is softening with treatment which allows for improved lymph uptake, and decreased risk of infection, and Pt is demonstrating continued limb/truncal swelling reduction and improve tissue integrity  Decreased arm volumes bilaterally.      ASSESSMENT/Reason for Referral:   31 y.o. 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  Pt is s/p right breast cancer with mastectomy and tissue expander, ALND and radiation.  The patient was treated for lymphedema and recently discharged.  She began to note increased swelling in her right arm and trunk in the past 3 weeks.  She is to undergo reconstruction surgery in 2 weeks, and is concerned about her lymphedema worsening.  She has been consistently excellent in self care.   .  In addition to lymphedema , the patient is experiencing these side effects from cancer treatments: Radiation: Fibrosis/myofascial restrictions  Currently the right limb is 6.44 % bigger than the left limb.     Recommendations for treatment/garments:  1) Patient will obtain proper garments to address swelling and improve tissue integrity. Garment recommendations :May need to increase to 30-40 mm Hg RTW sleeve/glove, or go into custom sleeve, either Elvarex or Mediven 550 if swelling does not reduce with treatments   2) Pt will be seen clinically for rehabilitation to include treatments to address as needed: edema/lymphedema, pain, ROM, strength, balance and endurance deficits, as well as learning self care strategies to address these deficits  3) Pt has  a pneumatic compression pump  Patient requires skilled Physical Therapy services  for the following problem list and secondary functional limitations:    Problem List:   Uncontrolled swelling and/or lack of appropriate compression garment    Secondary Functional Limitations:  Uncontrolled swelling can increase chances of  infection, and limit functional ROM     Patient Goals: Decrease swelling    Physical Therapy Goals:   In 1-3 months: Pt and/or caregiver will:    Decrease limb volume by 1-3 % for proper fit of clothing and to minimize infection risk x 1    In 6-12 months: Pt and/or caregiver will:  Be independent with home management of self care related to lymphedema to reduce infection risk and recurrent hospitalizations  Retain an optimal limb volume that allows pt to fit into appropriate compression garments and clothing to improve functional use of UE and QOL        PLAN:         Planned frequency and duration  of treatment:   1 x week x  10 weeks    Next Visit Plan: MLD, MFR, lymphatouch to right UE/trunk, may start ROM/strength once cleared by surgeon      SUBJECTIVE:  Patient???s communication preference: verbal, written, visual, prn   History of Present Illness/ Pt reports:  I have to wait two more weeks for any cardio/or exercise    Location of pain: no pain    OBJECTIVE:      Posture/Observations:  Forward head, rounded shoulders , guarded posturing              Girth Measurements: taken in cm    Date: 2-28 2-28 3-23 3-23 4-19 4-19        Right Left RIght Left Right Left RIght Left RIght Left   MCP 18 18 17.6 17.2 15.5 15.5       Mid palm 20 19 19.8 17.5 20.2 20.6       Thumb DIP             1st finger DIP             1st finger PIP             2nd finger DIP             2nd finger PIP             3rd finger DIP             3rd finger PIP             4th finger DIP             4th finger PIP                          Axilla             Mid breast nipple line             Ribs below breast             Natural Waistline             Umbilicus             Hips at  cm below umbilicus             Limb Volume                        Location of swelling:  right hand , wrist, forearm, elbow, arm, shoulder, axilla, chest, breast and ribs    Skin condition:  Stemmer???s sign:no.  increased skin thickness  Incision/Scar:   incision healing well postoperatively        Total Treatment Time: 53 min    Treatment Rendered:    Patient and/or caregiver and therapist were mask compliant per current [place] COVID mask policy during the entire treatment session.          Manual x 53 min   Axillary Web syndrome release , Myofascial release/Spontaneous Muscle Release Techniques, Lymphatouch to affected areas with simultaneous MFR, and MLD to appropriate anastomoses in order to decrease swelling.  Application of compression garment/devices prn to prevent re-accumulation of fluid.       Equipment provided/recommended:   N/A    Communication/consultation with other professionals:  N/A    Referrals made to the following providers:  none    Medical Necessity: This treatment is medically necessary throughout the course of this patient's life during and after cancer treatments to improve  functional activities and /or to minimize the decline of functional abilities and worsening of symptoms,including infection and recurrent hospitalizations, through independent/home management strategies.  Lymphedema, a chronic, progressive condition for which there is no cure, is marked by the accumulation of protein-rich fluid in one or more quadrants of the body due to primary or secondary disruption of the lymphatic system.  The sustained accumulation results in tissue inflammation, an increase in fatty tissue, and development of obstructive connective tissue.  These changes may result in an increased risk of infection, disfigurement and a decrease in mobility and functional performance.   Pt will benefit from physical therapy by a certified lymphedema therapist to address : education in self care, precautions, and management of symptoms, optimal edema reduction/maintenance of girth, optimal soft tissue changes, fitting of appropriate compression garments, and progression of exercises to optimize functional ROM and strength, balance, and endurance in all ADL's(home, work, recreational, community).      I attest that I have reviewed the above information.  Signed: Clementeen Graham, PT   09/18/2021 11:10 AM

## 2021-09-23 ENCOUNTER — Telehealth: Admit: 2021-09-23 | Discharge: 2021-09-24 | Payer: PRIVATE HEALTH INSURANCE | Attending: Clinical | Primary: Clinical

## 2021-09-26 NOTE — Unmapped (Signed)
[facility] THERAPY SERVICES   OUTPATIENT PHYSICAL THERAPY  09/26/2021  Note Type: Treatment Note       Patient Name: Tami Gibson  Date of Birth:Jan 17, 1991  Diagnosis:   Encounter Diagnoses   Name Primary?    Lymphedema syndrome, postmastectomy Yes    Scar condition and fibrosis of skin      Referring MD:  [name], MD     Date of Onset of Impairment-07/09/2021  Date PT Care Plan Established or Reviewed-07/30/2021  Date PT Treatment Started-07/30/2021       Session #:  8 of 11      Contraindications:  none  Precautions:  none  Red Flags:  history of cancer, right breast IB    Metastatic cancer:   no   Assessment/Progress toward goals:  Pt is progressing toward meeting goals, Patient's skin integrity is improving.  Skin is softening with treatment which allows for improved lymph uptake, and decreased risk of infection, and Pt is demonstrating continued limb/truncal swelling reduction and improve tissue integrity Will begin ROM/strength ex next visit.        ASSESSMENT/Reason for Referral:   31 y.o. 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  Pt is s/p right breast cancer with mastectomy and tissue expander, ALND and radiation.  The patient was treated for lymphedema and recently discharged.  She began to note increased swelling in her right arm and trunk in the past 3 weeks.  She is to undergo reconstruction surgery in 2 weeks, and is concerned about her lymphedema worsening.  She has been consistently excellent in self care.   .  In addition to lymphedema , the patient is experiencing these side effects from cancer treatments: Radiation: Fibrosis/myofascial restrictions  Currently the right limb is 6.44 % bigger than the left limb.     Recommendations for treatment/garments:  1) Patient will obtain proper garments to address swelling and improve tissue integrity. Garment recommendations :May need to increase to 30-40 mm Hg RTW sleeve/glove, or go into custom sleeve, either Elvarex or Mediven 550 if swelling does not reduce with treatments   2) Pt will be seen clinically for rehabilitation to include treatments to address as needed: edema/lymphedema, pain, ROM, strength, balance and endurance deficits, as well as learning self care strategies to address these deficits  3) Pt has  a pneumatic compression pump  Patient requires skilled Physical Therapy services  for the following problem list and secondary functional limitations:    Problem List:   Uncontrolled swelling and/or lack of appropriate compression garment    Secondary Functional Limitations:  Uncontrolled swelling can increase chances of  infection, and limit functional ROM     Patient Goals: Decrease swelling    Physical Therapy Goals:   In 1-3 months: Pt and/or caregiver will:    Decrease limb volume by 1-3 % for proper fit of clothing and to minimize infection risk x 1    In 6-12 months: Pt and/or caregiver will:  Be independent with home management of self care related to lymphedema to reduce infection risk and recurrent hospitalizations  Retain an optimal limb volume that allows pt to fit into appropriate compression garments and clothing to improve functional use of UE and QOL        PLAN:         Planned frequency and duration  of treatment:   1 x week x 10 weeks    Next Visit Plan: MLD, MFR, lymphatouch to right UE/trunk, may start  ROM/strength once cleared by surgeon      SUBJECTIVE:  Patient???s communication preference: verbal, written, visual, prn   History of Present Illness/ Pt reports:  I just want to confirm with my surgeon before starting exercise    Location of pain: no pain    OBJECTIVE:      Posture/Observations:  Forward head, rounded shoulders , guarded posturing              Girth Measurements: taken in cm    Date: 2-28 2-28 3-23 3-23 4-19 4-19        Right Left RIght Left Right Left RIght Left RIght Left   MCP 18 18 17.6 17.2 15.5 15.5       Mid palm 20 19 19.8 17.5 20.2 20.6       Thumb DIP             1st finger DIP             1st finger PIP             2nd finger DIP             2nd finger PIP             3rd finger DIP             3rd finger PIP             4th finger DIP             4th finger PIP                          Axilla             Mid breast nipple line             Ribs below breast             Natural Waistline             Umbilicus             Hips at  cm below umbilicus             Limb Volume                        Location of swelling:  right hand , wrist, forearm, elbow, arm, shoulder, axilla, chest, breast and ribs    Skin condition:  Stemmer???s sign:no.  increased skin thickness  Incision/Scar:   incision healing well postoperatively        Total Treatment Time: 53 min    Treatment Rendered:    Patient and/or caregiver and therapist were mask compliant per current [place] COVID mask policy during the entire treatment session.          Manual x 53 min   Axillary Web syndrome release , Myofascial release/Spontaneous Muscle Release Techniques, Lymphatouch to affected areas with simultaneous MFR, and MLD to appropriate anastomoses in order to decrease swelling.  Application of compression garment/devices prn to prevent re-accumulation of fluid.       Equipment provided/recommended:   N/A    Communication/consultation with other professionals:  N/A    Referrals made to the following providers:  none    Medical Necessity: This treatment is medically necessary throughout the course of this patient's life during and after cancer treatments to improve functional activities and /or to minimize the decline of functional abilities and worsening of symptoms,including infection  and recurrent hospitalizations, through independent/home management strategies.  Lymphedema, a chronic, progressive condition for which there is no cure, is marked by the accumulation of protein-rich fluid in one or more quadrants of the body due to primary or secondary disruption of the lymphatic system.  The sustained accumulation results in tissue inflammation, an increase in fatty tissue, and development of obstructive connective tissue.  These changes may result in an increased risk of infection, disfigurement and a decrease in mobility and functional performance.   Pt will benefit from physical therapy by a certified lymphedema therapist to address : education in self care, precautions, and management of symptoms, optimal edema reduction/maintenance of girth, optimal soft tissue changes, fitting of appropriate compression garments, and progression of exercises to optimize functional ROM and strength, balance, and endurance in all ADL's(home, work, recreational, community).      I attest that I have reviewed the above information.  Signed: Clementeen Graham, PT   09/26/2021 9:53 AM

## 2021-09-26 NOTE — Unmapped (Signed)
[facility] Refill Coordination Note    Specialty Medication(s) to be Shipped:   Hematology/Oncology: Verzenio 100mg     Other medication(s) to be shipped: No additional medications requested for fill at this time     Tami Gibson, DOB: [birth year]  Phone: [phone] (home)       All above HIPAA information was verified with patient.     Was a Nurse, learning disability used for this call? No    Completed refill call assessment today to schedule patient's medication shipment from the [facility] [phone]).  All relevant notes have been reviewed.     Specialty medication(s) and dose(s) confirmed: Regimen is correct and unchanged.   Changes to medications: [name] reports starting the following medications: ozempic  0.5 mg/ml - inject  0.5  once  a  week    Changes to insurance: No  New side effects reported not previously addressed with a pharmacist or physician: None reported  Questions for the pharmacist: No    Confirmed patient received a Conservation officer, historic buildings and a Surveyor, mining with first shipment. The patient will receive a drug information handout for each medication shipped and additional FDA Medication Guides as required.       DISEASE/MEDICATION-SPECIFIC INFORMATION        N/A    SPECIALTY MEDICATION ADHERENCE     Medication Adherence    Patient reported X missed doses in the last month: 0  Specialty Medication: VERZENIO 100 mg  Patient is on additional specialty medications: No  Patient is on more than two specialty medications: No  Any gaps in refill history greater than 2 weeks in the last 3 months: no  Demonstrates understanding of importance of adherence: yes              Were doses missed due to medication being on hold? Yes - pt  had surgery  on  08/13/21 -  had  to  stop  2 weeks  before  and   2 weeks  after      Verzenio 100 mg: 7  days of medicine on hand     REFERRAL TO PHARMACIST     Referral to the pharmacist: Not needed      [facility]     Shipping address confirmed in Epic.     Delivery Scheduled: Yes, Expected medication delivery date: 10/02/21 .     Medication will be delivered via Next Day Courier to the prescription address in Epic WAM.    [name]   [facility] Specialty Technician

## 2021-09-27 NOTE — Unmapped (Signed)
[facility]  Comprehensive Cancer Support Program   Telehealth Encounter    *non billable encounter*      Encounter Description: This encounter was conducted via telephone in the setting of State of Emergency due to COVID-19 Pandemic.     The patient reports they are currently: not at home. I spent 15 minutes on the phone with the patient on the date of service. I spent an additional 5 minutes on pre- and post-visit activities on the date of service.     The patient was physically located in [place] or a state in which I am permitted to provide care. The patient and/or parent/guardian understood that s/he may incur co-pays and cost sharing, and agreed to the telemedicine visit. The visit was reasonable and appropriate under the circumstances given the patient's presentation at the time.    The patient and/or parent/guardian has been advised of the potential risks and limitations of this mode of treatment (including, but not limited to, the absence of in-person examination) and has agreed to be treated using telemedicine. The patient's/patient's family's questions regarding telemedicine have been answered.     If the visit was completed in an ambulatory setting, the patient and/or parent/guardian has also been advised to contact their provider???s office for worsening conditions, and seek emergency medical treatment and/or call 911 if the patient deems either necessary.      Assessment:  Tami Gibson is a 31 y.o. female with a history of breast cancer. Referred by Luetta Nutting, PT for support navigating survivorship and long term impact of treatment (lymphedema). She describes a normal and healthy emotional response to treatment and is interested in engaging in therapy as she transitions from active treatment to survivorship.     Risk Assessment:  A suicide and violence risk assessment was performed as part of this evaluation. There is no acute risk for suicide or violence at this time.  While future psychiatric events cannot be accurately predicted, the patient does not currently require acute inpatient psychiatric care and does not currently meet [facility] involuntary commitment criteria.       Plan:  Will continue to follow for supportive counseling.      Subjective:   Patient interviewed in a private place accompanied by no one. Shared updates since our last visit. Recovery from surgery continues to go smoothly. Feeling grateful to be past this stage in survivorship. Some work related stressors, but overall feeling like herself. Looking forward to summer.  Provided supportive counseling, active listening, normalization, and psychoeducation.     Objective:    Mental Status Exam:  Speech/Language:    Normal rate, volume, tone, fluency   Mood:  euthymic   Thought process and Associations:   Logical, linear, clear, coherent, goal directed   Abnormal/psychotic thought content:     Denies SI, HI, self harm, delusions, obsessions, paranoid ideation, or ideas of reference   Perceptual disturbances:     Does not endorse auditory or visual hallucinations     Orientation:   Oriented to person, place, time, and general circumstances   Insight:     Intact   Judgment:    Intact   Impulse Control:   Intact     [name], LCSW  Comprehensive Cancer Support Program  Phone: [phone]  Pager: [phone]

## 2021-10-01 DIAGNOSIS — E661 Drug-induced obesity: Principal | ICD-10-CM

## 2021-10-01 DIAGNOSIS — Z6836 Body mass index (BMI) 36.0-36.9, adult: Principal | ICD-10-CM

## 2021-10-01 MED ORDER — SEMAGLUTIDE 0.25 MG OR 0.5 MG (2 MG/3 ML) SUBCUTANEOUS PEN INJECTOR
SUBCUTANEOUS | 3 refills | 56 days | Status: CP
Start: 2021-10-01 — End: ?

## 2021-10-01 MED FILL — VERZENIO 100 MG TABLET: ORAL | 28 days supply | Qty: 56 | Fill #5

## 2021-10-02 ENCOUNTER — Institutional Professional Consult (permissible substitution): Admit: 2021-10-02 | Discharge: 2021-10-02 | Payer: PRIVATE HEALTH INSURANCE

## 2021-10-02 DIAGNOSIS — C50911 Malignant neoplasm of unspecified site of right female breast: Principal | ICD-10-CM

## 2021-10-02 DIAGNOSIS — Z170 Estrogen receptor positive status [ER+]: Principal | ICD-10-CM

## 2021-10-02 MED ADMIN — leuprolide (LUPRON) injection 3.75 mg: 3.75 mg | INTRAMUSCULAR | @ 13:00:00 | Stop: 2021-10-02

## 2021-10-02 NOTE — Unmapped (Signed)
Pt received lupron 3.75mg  injection in Left ventroGluteus. Tolerated it well. Applied guaze and band aid.

## 2021-10-03 NOTE — Unmapped (Signed)
[facility] THERAPY SERVICES [place]  OUTPATIENT PHYSICAL THERAPY  10/02/2021  Note Type: Treatment Note       Patient Name: Tami Gibson  Date of Birth:[birth year]  Diagnosis:   Encounter Diagnoses   Name Primary?    Lymphedema syndrome, postmastectomy Yes    Scar condition and fibrosis of skin     Malignant neoplasm of right breast in female, estrogen receptor positive, unspecified site of breast (CMS-HCC)      Referring MD:  [name], MD     Date of Onset of Impairment-07/09/2021  Date PT Care Plan Established or Reviewed-07/30/2021  Date PT Treatment Started-07/30/2021       Session #:  9 of 11      Contraindications:  none  Precautions:  none  Red Flags:  history of cancer, right breast IB    Metastatic cancer:   no   Assessment/Progress toward goals:  Pt is progressing toward meeting goals, Patient's skin integrity is improving.  Skin is softening with treatment which allows for improved lymph uptake, and decreased risk of infection, and Pt is demonstrating continued limb/truncal swelling reduction and improve tissue integrity Tolerated ex well in clinic without pain.        ASSESSMENT/Reason for Referral:   31 y.o. 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  Pt is s/p right breast cancer with mastectomy and tissue expander, ALND and radiation.  The patient was treated for lymphedema and recently discharged.  She began to note increased swelling in her right arm and trunk in the past 3 weeks.  She is to undergo reconstruction surgery in 2 weeks, and is concerned about her lymphedema worsening.  She has been consistently excellent in self care.   .  In addition to lymphedema , the patient is experiencing these side effects from cancer treatments: Radiation: Fibrosis/myofascial restrictions  Currently the right limb is 6.44 % bigger than the left limb.     Recommendations for treatment/garments:  1) Patient will obtain proper garments to address swelling and improve tissue integrity. Garment recommendations :May need to increase to 30-40 mm Hg RTW sleeve/glove, or go into custom sleeve, either Elvarex or Mediven 550 if swelling does not reduce with treatments   2) Pt will be seen clinically for rehabilitation to include treatments to address as needed: edema/lymphedema, pain, ROM, strength, balance and endurance deficits, as well as learning self care strategies to address these deficits  3) Pt has  a pneumatic compression pump  Patient requires skilled Physical Therapy services  for the following problem list and secondary functional limitations:    Problem List:   Uncontrolled swelling and/or lack of appropriate compression garment    Secondary Functional Limitations:  Uncontrolled swelling can increase chances of  infection, and limit functional ROM     Patient Goals: Decrease swelling    Physical Therapy Goals:   In 1-3 months: Pt and/or caregiver will:    Decrease limb volume by 1-3 % for proper fit of clothing and to minimize infection risk x 1    In 6-12 months: Pt and/or caregiver will:  Be independent with home management of self care related to lymphedema to reduce infection risk and recurrent hospitalizations  Retain an optimal limb volume that allows pt to fit into appropriate compression garments and clothing to improve functional use of UE and QOL        PLAN:         Planned frequency and duration  of treatment:  1 x week x 10 weeks    Next Visit Plan: MLD, MFR, lymphatouch to right UE/trunk, ROM/strength     SUBJECTIVE:  Patient???s communication preference: verbal, written, visual, prn   History of Present Illness/ Pt reports:  Ready for some exercise    Location of pain: no pain    OBJECTIVE:      Posture/Observations:  Forward head, rounded shoulders , guarded posturing              Girth Measurements: taken in cm    Date: 2-28 2-28 3-23 3-23 4-19 4-19        Right Left RIght Left Right Left RIght Left RIght Left   MCP 18 18 17.6 17.2 15.5 15.5       Mid palm 20 19 19.8 17.5 20.2 20.6 Thumb DIP             1st finger DIP             1st finger PIP             2nd finger DIP             2nd finger PIP             3rd finger DIP             3rd finger PIP             4th finger DIP             4th finger PIP                          Axilla             Mid breast nipple line             Ribs below breast             Natural Waistline             Umbilicus             Hips at  cm below umbilicus             Limb Volume                        Location of swelling:  right hand , wrist, forearm, elbow, arm, shoulder, axilla, chest, breast and ribs    Skin condition:  Stemmer???s sign:no.  increased skin thickness  Incision/Scar:   incision healing well postoperatively        Total Treatment Time: 53 min    Treatment Rendered:    Patient and/or caregiver and therapist were mask compliant per current [place] COVID mask policy during the entire treatment session.          Manual x 45 min   Axillary Web syndrome release , Myofascial release/Spontaneous Muscle Release Techniques, Lymphatouch to affected areas with simultaneous MFR, and MLD to appropriate anastomoses in order to decrease swelling.  Application of compression garment/devices prn to prevent re-accumulation of fluid.     Therapeutic ex: 8 min: pulleys for ROM, rolling ball up wall, lean into ball,   Red theraband: 1 set of 10: rows, extension, ER/IR   Equipment provided/recommended:   N/A    Communication/consultation with other professionals:  N/A    Referrals made to the following providers:  none    Medical Necessity: This treatment is medically necessary throughout the course of this  patient's life during and after cancer treatments to improve functional activities and /or to minimize the decline of functional abilities and worsening of symptoms,including infection and recurrent hospitalizations, through independent/home management strategies.  Lymphedema, a chronic, progressive condition for which there is no cure, is marked by the accumulation of protein-rich fluid in one or more quadrants of the body due to primary or secondary disruption of the lymphatic system.  The sustained accumulation results in tissue inflammation, an increase in fatty tissue, and development of obstructive connective tissue.  These changes may result in an increased risk of infection, disfigurement and a decrease in mobility and functional performance.   Pt will benefit from physical therapy by a certified lymphedema therapist to address : education in self care, precautions, and management of symptoms, optimal edema reduction/maintenance of girth, optimal soft tissue changes, fitting of appropriate compression garments, and progression of exercises to optimize functional ROM and strength, balance, and endurance in all ADL's(home, work, recreational, community).      I attest that I have reviewed the above information.  Signed: Clementeen Graham, PT   10/02/2021 8:00 AM

## 2021-10-06 NOTE — Unmapped (Signed)
CC: Post op     HPI:  Tami Gibson is a 31 y.o. 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 Teola Bradley) and immediate reconstruction with tissue expander placement on 04/20/20. Patients final pathology revealed residual disease with 2/3 positive lymph nodes and she underwent ALND with Dr. Teola Bradley on 05/07/20. She is on letrozole. She underwent adjuvant radiation (completed 08/06/20). She was originally interested in DIEP flap reconstruction but did not meet the BMI criteria. She underwent tissue expander to implant exchange (SCX 650 ml) with fat grafting to the bilateral breasts from the abdomen on 08/13/20. She took a 1 month course of Singulair to prevent capsular contracture.     Since her last visit, she has been doing well overall. She was wondering if she got get lymphatic massage. She has some sensitivity to the right incision  As well as some tethering of the right breast. She is interested in a revision procedure in November.  She is happy with how the left breast looks.     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    followed by ophthalmologist    Kidney stone     Malignant neoplasm of right breast in female, 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/30/2019    Pyelonephritis 09/2016    Kidney stones    Urinary tract infection     Had 5-6 in Lifetime, esp high school and college age    Varicella     during childhood    Vitamin B 12 deficiency        Past Surgical History:  Past Surgical History:   Procedure Laterality Date    BREAST BIOPSY Right     IDC    CESAREAN SECTION  01/30/2017    breech    CHEMOTHERAPY      IR INSERT PORT AGE GREATER THAN 5 YRS  12/08/2019    IR INSERT PORT AGE GREATER THAN 5 YRS 12/08/2019 Jobe Gibbon, MD IMG VIR [facility]    LUMBAR PUNCTURE DIAGNOSTIC [facility] HISTORICAL RESULT)  01/2015    to relieve ICP    PR BX/REMV,LYMPH NODE,DEEP AXILL Right 04/20/2020    Procedure: BX/EXC LYMPH NODE; OPEN, DEEP AXILRY NODE;  Surgeon: Moss Mc, MD;  Location: MAIN OR [facility];  Service: Surgical Oncology Breast    PR CESAREAN DELIVERY ONLY N/A 01/30/2017    Procedure: CESAREAN DELIVERY ONLY;  Surgeon: Asher Muir, MD;  Location: L&D C-SECTION OR SUITES [facility];  Service: Maternal-Fetal Medicine    PR CESAREAN DELIVERY ONLY N/A 02/17/2019    Procedure: CESAREAN DELIVERY ONLY;  Surgeon: Lonny Prude, MD;  Location: L&D C-SECTION OR SUITES [facility];  Service: Maternal-Fetal Medicine    PR IMPLNT BIO IMPLNT FOR SOFT TISSUE REINFORCEMENT Bilateral 04/20/2020    Procedure: IMPLANTATION BIOLOGIC IMPLANT(EG, ACELLULAR DERMAL MATRIX) FOR SOFT TISSUE  REINFORCEMENT(EG, BREAST, TRUNK);  Surgeon: Arsenio Katz, MD;  Location: MAIN OR [facility];  Service: Plastics    PR INTRAOPERATIVE SENTINEL LYMPH NODE ID W DYE INJECTION Right 04/20/2020    Procedure: INTRAOPERATIVE IDENTIFICATION SENTINEL LYMPH NODE(S) INCLUDE INJECTION NON-RADIOACTIVE DYE, WHEN PERFORMED;  Surgeon: Moss Mc, MD;  Location: MAIN OR [place];  Service: Surgical Oncology Breast    PR INTRAOPERATIVE SENTINEL LYMPH NODE ID W DYE INJECTION Right 05/07/2020    Procedure: INTRAOPERATIVE IDENTIFICATION SENTINEL LYMPH NODE(S) INCLUDE INJECTION NON-RADIOACTIVE DYE, WHEN PERFORMED;  Surgeon: Moss Mc, MD;  Location: MAIN OR [place];  Service: Surgical Oncology Breast    PR MASTECTOMY, SIMPLE, COMPLETE Bilateral 04/20/2020    Procedure: MASTECTOMY, SIMPLE, COMPLETE;  Surgeon: Moss Mc, MD;  Location: MAIN OR [facility];  Service: Surgical Oncology Breast    PR REMOVE ARMPITS LYMPH NODES COMPLT Right 05/07/2020    Procedure: AXILLARY LYMPHADENECTOMY; COMPLETE;  Surgeon: Moss Mc, MD;  Location: MAIN OR [facility];  Service: Surgical Oncology Breast    PR REPLACEMENT TISSUE EXPANDER W/PERMANENT IMPLANT Bilateral 08/13/2021    Procedure: REPLACEMENT OF TISSUE EXPANDER WITH PERMANENT IMPLANT;  Surgeon: Arsenio Katz, MD;  Location: MAIN OR ;  Service: Plastics    PR TISSUE EXPANDER PLACEMENT BREAST RECONSTRUCTION Bilateral 04/20/2020    Procedure: TISSUE EXPANDER PLACEMENT IN BREAST RECONSTRUCTION, INCLUDING SUBSEQUENT EXPANSION(S);  Surgeon: Arsenio Katz, MD;  Location: MAIN OR ;  Service: Plastics    TONSILECTOMY, ADENOIDECTOMY, BILATERAL MYRINGOTOMY AND TUBES      WISDOM TOOTH EXTRACTION  2004       Medications:  Current Outpatient Medications   Medication Sig Dispense Refill    abemaciclib (VERZENIO) 100 mg Tab tablet Take 1 tablet (100 mg total) by mouth Two (2) times a day. Take with or without food. 56 tablet 5    acetaminophen (TYLENOL) 325 MG tablet Take 2 tablets (650 mg total) by mouth every six (6) hours as needed for pain.      calcium carbonate (OS-CAL) 500 mg calcium (1,250 mg) chewable tablet Chew.      calcium carbonate (TUMS) 200 mg calcium (500 mg) chewable tablet Chew 1 tablet (200 mg of elem calcium total) daily.      cholecalciferol, vitamin D3-25 mcg, 1,000 unit,, 25 mcg (1,000 unit) capsule Take 1 capsule (25 mcg total) by mouth.      cyanocobalamin 100 MCG tablet Take 1 tablet (100 mcg total) by mouth daily.      docusate sodium (COLACE) 100 MG capsule Take 1 capsule (100 mg total) by mouth nightly as needed (PRN).      ergocalciferol-1,250 mcg, 50,000 unit, (DRISDOL) 1,250 mcg (50,000 unit) capsule Take 1 capsule (1,250 mcg total) by mouth once a week.      escitalopram oxalate (LEXAPRO) 20 MG tablet Take 1 tablet (20 mg total) by mouth daily. 90 tablet 3    fexofenadine (ALLEGRA) 180 MG tablet Take 1 tablet (180 mg total) by mouth.      letrozole (FEMARA) 2.5 mg tablet Take 1 tablet by mouth once daily 90 tablet 0    leuprolide (LUPRON) 3.75 mg injection Inject 3.75 mg into the muscle.      montelukast (SINGULAIR) 10 mg tablet Take 1 tablet (10 mg total) by mouth nightly. 30 tablet 0    omeprazole (PRILOSEC) 20 MG capsule Take 1 capsule by mouth once daily 90 capsule 0    ondansetron (ZOFRAN-ODT) 4 MG disintegrating tablet Take 1 tablet (4 mg total) by mouth every  eight (8) hours as needed for nausea. 30 tablet 3    rimegepant (NURTEC ODT) 75 mg TbDL Take 1 tablet (75 mg total) by mouth.      semaglutide 0.25 mg or 0.5 mg (2 mg/3 mL) PnIj Inject 0.25 mg under the skin every seven (7) days. 3 mL 3    SUMAtriptan (IMITREX) 100 MG tablet TAKE ONE TABLET BY MOUTH AT ONSET OF MIGRAINE FOR UP TO 1 DOSE . IF SYMPTOMS PERSIST A SECOND DOSE MAY BE TAKEN IN 2 HOURS IF NEEDED      topiramate (TOPAMAX) 25 MG tablet TAKE 3 TABLETS BY MOUTH ONCE DAILY      traZODone (DESYREL) 50 MG tablet Take 0.5-1 tablets (25-50 mg total) by mouth nightly as needed for sleep. (for sleep) 30 tablet 5    vitamin E-134 mg, 200 UNIT, 134 mg (200 UNIT) capsule Take 1 capsule (134 mg total) by mouth daily.       No current facility-administered medications for this visit.     Facility-Administered Medications Ordered in Other Visits   Medication Dose Route Frequency Provider Last Rate Last Admin    leuprolide (LUPRON) 3.75 mg injection                Allergies:  No Known Allergies    Family History:   Family History   Problem Relation Age of Onset    Cancer Maternal Aunt 34        breast Ca    Hypertension Father     Other Father 72        Benign brain tumor    Hypothyroidism Mother     Cancer Paternal Aunt         Breast Ca dx in 46s    ALS Maternal Aunt     Ovarian cancer Maternal Grandmother 39    No Known Problems Maternal Grandfather Cancer Paternal Grandmother         Unknown Cancer    No Known Problems Paternal Grandfather     No Known Problems Maternal Aunt     No Known Problems Maternal Aunt     Breast cancer Maternal Cousin         Dx in 39s    No Known Problems Paternal Aunt     No Known Problems Daughter     No Known Problems Daughter     No Known Problems Other     BRCA 1/2 Neg Hx     Colon cancer Neg Hx     Endometrial cancer Neg Hx     The patient reports no family history for bleeding disorders or anesthetic problems.    Social History:  Social History     Socioeconomic History    Marital status: Married     Spouse name: Dylan    Number of children: 0   Occupational History    Occupation: Architect    Tobacco Use    Smoking status: Never    Smokeless tobacco: Never   Vaping Use    Vaping Use: Never used   Substance and Sexual Activity    Alcohol use: Not Currently    Drug use: No    Sexual activity: Yes     Partners: Male     Birth control/protection: Pill   Other Topics Concern    Exercise Yes    Living Situation No   Social History Narrative    PRECONCEPTUAL ASSESSMENT:        Lysette is a 31  y.o. Caucasian female    Caffeine:denies use    Cats: no    Exercise: not active    Diet: balanced    Family hx of of birth defects, chromosomal abnormality, intellectual disability, developmental delay: no.            PARTNER HISTORY:     Domingo Dimes is a 26 y.o.  Caucasian female.    Occupation:  Immunologist; [employer]    He has fathered children:  no    He is healthy with no chronic illness    Family history of  birth defects, chromosomal abnormality, intellectual disability, or developmental delay: no.                ROS:   Otherwise, 12 point review of systems was completed and is negative except as per HPI.      Vitals:   Vitals:    10/10/21 0753   BP: 120/77   Pulse: 94   Resp: 16   Temp: 36.1 ??C (97 ??F)   SpO2: 99%        Gen: AA in NAD  HEENT: NCAT, EOMI, PERRL, Non icteric sclerae, MMM  CV: RRR  RESP: quiet respirations on room air  EXT: warm and well perfused, compression garment to RUE  ABDOMEN: Soft, redundancy noted.  No obvious contour irregularity. Incisions well healed.   BREASTS: Some improved mobility on the right breast.  Softer.  Improved symmetry but still with some fullness superiorly on the right.  Right lateral breast with a linear tether (improved from preop)  IMF location improved but still higher than the left.  Projection similar bilaterally. Some hollowing of the left lateral breast     Impression and Plan:  Chessica Gillean is here today for a post op s/p tissue expander to permanent implant (650 SCX) with liposuction from the abdomen and fat grafting to bilateral breasts 08/13/21. Doing well.     - No restrictions, can sleep on stomach, [place]    - Finished 1 month of Singulair. Can discontinue this now   - Recommend scar desensitization with dry washcloth   - Discussed revision procedure for the right with lowering the right breast and fat grafting (plan to look for date after the second week in November 2023)   - Follow up in 3 months     ______________________________________________________________________    Documentation assistance was provided by Marga Melnick, Scribe, on Oct 10, 2021 at 8:22 AM for [place] C. [place] Dragon, MD    The documentation recorded by the scribe accurately reflects the service I personally performed and the decisions made by me Janet Berlin.

## 2021-10-07 MED ORDER — MONTELUKAST 10 MG TABLET
ORAL_TABLET | 0 refills | 0 days
Start: 2021-10-07 — End: ?

## 2021-10-09 ENCOUNTER — Ambulatory Visit
Admit: 2021-10-09 | Payer: PRIVATE HEALTH INSURANCE | Attending: Rehabilitative and Restorative Service Providers" | Primary: Rehabilitative and Restorative Service Providers"

## 2021-10-09 NOTE — Unmapped (Signed)
[facility] THERAPY SERVICES [place]  OUTPATIENT PHYSICAL THERAPY  10/09/2021  Note Type: Treatment Note       Patient Name: Tami Gibson  Date of Birth:05/12/1991  Diagnosis:   No diagnosis found.    Referring MD:  [name], MD     Date of Onset of Impairment-07/09/2021  Date PT Care Plan Established or Reviewed-07/30/2021  Date PT Treatment Started-07/30/2021       Session #:  10 of 11      Contraindications:  none  Precautions:  none  Red Flags:  history of cancer, right breast IB    Metastatic cancer:   no   Assessment/Progress toward goals:  Pt is progressing toward meeting goals, Patient's skin integrity is improving.  Skin is softening with treatment which allows for improved lymph uptake, and decreased risk of infection, and Pt is demonstrating continued limb/truncal swelling reduction and improve tissue integrity Tolerated ex well in clinic without pain.        ASSESSMENT/Reason for Referral:   31 y.o. 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  Pt is s/p right breast cancer with mastectomy and tissue expander, ALND and radiation.  The patient was treated for lymphedema and recently discharged.  She began to note increased swelling in her right arm and trunk in the past 3 weeks.  She is to undergo reconstruction surgery in 2 weeks, and is concerned about her lymphedema worsening.  She has been consistently excellent in self care.   .  In addition to lymphedema , the patient is experiencing these side effects from cancer treatments: Radiation: Fibrosis/myofascial restrictions  Currently the right limb is 6.44 % bigger than the left limb.     Recommendations for treatment/garments:  1) Patient will obtain proper garments to address swelling and improve tissue integrity. Garment recommendations :May need to increase to 30-40 mm Hg RTW sleeve/glove, or go into custom sleeve, either Elvarex or Mediven 550 if swelling does not reduce with treatments   2) Pt will be seen clinically for rehabilitation to include treatments to address as needed: edema/lymphedema, pain, ROM, strength, balance and endurance deficits, as well as learning self care strategies to address these deficits  3) Pt has  a pneumatic compression pump  Patient requires skilled Physical Therapy services  for the following problem list and secondary functional limitations:    Problem List:   Uncontrolled swelling and/or lack of appropriate compression garment    Secondary Functional Limitations:  Uncontrolled swelling can increase chances of  infection, and limit functional ROM     Patient Goals: Decrease swelling    Physical Therapy Goals:   In 1-3 months: Pt and/or caregiver will:    Decrease limb volume by 1-3 % for proper fit of clothing and to minimize infection risk x 1    In 6-12 months: Pt and/or caregiver will:  Be independent with home management of self care related to lymphedema to reduce infection risk and recurrent hospitalizations MET 5/10 23  Retain an optimal limb volume that allows pt to fit into appropriate compression garments and clothing to improve functional use of UE and QOL        PLAN:         Planned frequency and duration  of treatment:   1 x week x 10 weeks    Next Visit Plan: MLD, MFR, lymphatouch to right UE/trunk, ROM/strength , final measurements     SUBJECTIVE:  Patient???s communication preference: verbal, written, visual, prn  History of Present Illness/ Pt reports:  Noted some cording under right breast in shower this morning    Location of pain: no pain    OBJECTIVE:      Posture/Observations:  Forward head, rounded shoulders , guarded posturing              Girth Measurements: taken in cm    Date: 2-28 2-28 3-23 3-23 4-19 4-19        Right Left RIght Left Right Left RIght Left RIght Left   MCP 18 18 17.6 17.2 15.5 15.5       Mid palm 20 19 19.8 17.5 20.2 20.6       Thumb DIP             1st finger DIP             1st finger PIP             2nd finger DIP             2nd finger PIP 3rd finger DIP             3rd finger PIP             4th finger DIP             4th finger PIP                          Axilla             Mid breast nipple line             Ribs below breast             Natural Waistline             Umbilicus             Hips at  cm below umbilicus             Limb Volume                        Location of swelling:  right hand , wrist, forearm, elbow, arm, shoulder, axilla, chest, breast and ribs    Skin condition:  Stemmer???s sign:no.  increased skin thickness  Incision/Scar:   incision healing well postoperatively        Total Treatment Time: 53 min    Treatment Rendered:    Patient and/or caregiver and therapist were mask compliant per current [place] COVID mask policy during the entire treatment session.          Manual x 45 min   Axillary Web syndrome release , Myofascial release/Spontaneous Muscle Release Techniques, Lymphatouch to affected areas with simultaneous MFR, and MLD to appropriate anastomoses in order to decrease swelling.  Application of compression garment/devices prn to prevent re-accumulation of fluid.     Therapeutic ex: 8 min: pulleys for ROM, rolling ball up wall, lean into ball,   Red theraband: 1 set of 10: rows, extension, ER/IR, overhead press, lateral raises, hug a tree   Equipment provided/recommended:   N/A    Communication/consultation with other professionals:  N/A    Referrals made to the following providers:  none    Medical Necessity: This treatment is medically necessary throughout the course of this patient's life during and after cancer treatments to improve functional activities and /or to minimize the decline of functional abilities and worsening of symptoms,including infection and recurrent  hospitalizations, through independent/home management strategies.  Lymphedema, a chronic, progressive condition for which there is no cure, is marked by the accumulation of protein-rich fluid in one or more quadrants of the body due to primary or secondary disruption of the lymphatic system.  The sustained accumulation results in tissue inflammation, an increase in fatty tissue, and development of obstructive connective tissue.  These changes may result in an increased risk of infection, disfigurement and a decrease in mobility and functional performance.   Pt will benefit from physical therapy by a certified lymphedema therapist to address : education in self care, precautions, and management of symptoms, optimal edema reduction/maintenance of girth, optimal soft tissue changes, fitting of appropriate compression garments, and progression of exercises to optimize functional ROM and strength, balance, and endurance in all ADL's(home, work, recreational, community).      I attest that I have reviewed the above information.  Signed: Clementeen Graham, PT   10/09/2021 9:14 AM

## 2021-10-09 NOTE — Unmapped (Incomplete)
APPOINTMENTS / QUESTIONS  For NON-urgent questions regarding appointments or your surgical care, you may reach out to your provider and/or their nursing staff via MyChart. Please keep in mind that it may take up to 2-3 business days for a response.    Additionally, you may contact our clinics, Monday through Friday, 8 am to 4:30 pm    [facility] [place]  [address],   [place] [address]  T: [phone]  F: [phone]    [facility]  [address], [place], [place] [address]  [facility]  T: [phone]  F: [phone]    [facility] [place]  [address], [place], [place] [address]  T: [phone]  F: [phone]    [facility] [place]  [address], [place], [place] [address]  Third Floor, [address]  T: [phone]   F: [phone]    [facility]  1st Floor, Ambulatory Care Center  [address]  [place], [place] [address]  T: [phone]  Website:  [facility]    Administrative Office  [address]  [place], [place] [address]  T:  [phone]  F:  [phone]      Physicians:  Aldan Camey Jeni Carr MD:   Nurse:  Edessa Jakubowicz BSN, RN [phone]    Surgery Scheduler: Cristina Etchart Martin  [phone]     [place] Damitz MD:   Nurse:  Nicole Bailey BSN, RN, CPSN [phone]  Surgery Scheduler:   Cristina Etchart Martin  [phone]     Adeyemi Yemi Ogunleye MD:  Nurse: Jacquelyn Eron BSN, RN, BC  [phone]  Surgery Scheduler: Kina Williamson [phone]    Jeyhan Wood MD:  Nurse: Rachel Heller BSN, RN, CPSN  [phone]  Surgery Scheduler: Kina Williamson [phone]    Advance Practice Providers:  Renee E Edkins, DNP, NP-C:   Surgery Scheduler: Kina Williamson [phone]     Payton Leonhardt, PA-C:   Surgery Scheduler: Kina Williamson [phone]     Financial Navigator:  Darlene Smith  Hours:  7:30 am to 4 pm  P: [phone] - F: [phone]           Billing Questions/Financial Navigation:  [phone]  [facility] Customer Service Call Center:  [phone]    Surgery Scheduling:  You will receive a phone call from the surgery schedulers regarding date for surgery in 1-2 weeks from  your appointment.    Please call one of the above numbers if you have not received a phone call 2 weeks after your appointment with the provider.      [facility] Contact Information:   Please call to schedule  *Radiology (CT, X-ray, Ultrasound) [phone]  *Mammography & Breast Ultrasound [phone]  *MRI [phone]  *Interventional Radiology [phone]    AFTER HOURS/HOLIDAYS:  For emergencies after-hours (after 5pm, or weekends): call the [facility] operator ([phone]) and ask to page the Plastic Surgery resident on call. You will be directed to a surgery resident who likely is not immediately aware of the details of your case, but can help you deal with any emergencies that cannot wait until regular business hours.  Please be aware that this person is responding to many in-hospital emergencies and patient issues and may not answer your phone call immediately, but will return your call as soon as possible.    Precare Location:   [facility] at  ([facility])     [address]  [place],  [address] ([place] - [facility] near [address])    Blood Work Location:  [address], [place] [address]. There are no suite numbers, but it is located on the first floor there. Go to the main desk and tell them you are there for walk-in labs.  Precare:   The day prior to your scheduled surgery, [facility] will call you with instructions.  If you have not heard from them by 4PM, and would like to check on the status of your surgery, please call:  [facility]: [phone]  [facility]: [phone]  [facility]: [phone]  [place]:  [phone]    Insurance Denials:    All appeal initiation needs to be started by the patient.  We do not appeal insurance denials.     [place] Hotline:  [phone]    FLMA forms and paperwork:  Please allow  a 2 week turn around for forms to be completed and faxed.

## 2021-10-10 ENCOUNTER — Ambulatory Visit: Admit: 2021-10-10 | Discharge: 2021-10-11 | Payer: PRIVATE HEALTH INSURANCE

## 2021-10-10 DIAGNOSIS — Z9889 Other specified postprocedural states: Principal | ICD-10-CM

## 2021-10-14 DIAGNOSIS — Z9889 Other specified postprocedural states: Principal | ICD-10-CM

## 2021-10-14 DIAGNOSIS — C50911 Malignant neoplasm of unspecified site of right female breast: Principal | ICD-10-CM

## 2021-10-14 DIAGNOSIS — Z170 Estrogen receptor positive status [ER+]: Principal | ICD-10-CM

## 2021-10-23 DIAGNOSIS — Z170 Estrogen receptor positive status [ER+]: Principal | ICD-10-CM

## 2021-10-23 DIAGNOSIS — C50911 Malignant neoplasm of unspecified site of right female breast: Principal | ICD-10-CM

## 2021-10-23 MED ORDER — OZEMPIC 1 MG/DOSE (4 MG/3 ML) SUBCUTANEOUS PEN INJECTOR
SUBCUTANEOUS | 3 refills | 28 days | Status: CP
Start: 2021-10-23 — End: ?

## 2021-10-23 MED ORDER — VERZENIO 100 MG TABLET
ORAL_TABLET | Freq: Two times a day (BID) | ORAL | 5 refills | 28 days
Start: 2021-10-23 — End: ?

## 2021-10-23 NOTE — Unmapped (Signed)
[facility] Refill Coordination Note    Specialty Medication(s) to be Shipped:   Hematology/Oncology: Verzenio 100mg     Other medication(s) to be shipped: No additional medications requested for fill at this time     Tami Gibson, DOB: 1990/11/16  Phone: [phone] (home)       All above HIPAA information was verified with patient.     Was a Nurse, learning disability used for this call? No    Completed refill call assessment today to schedule patient's medication shipment from the [facility] [phone]).  All relevant notes have been reviewed.     Specialty medication(s) and dose(s) confirmed: Regimen is correct and unchanged.   Changes to medications: Tami Gibson reports no changes at this time.  Changes to insurance: No  New side effects reported not previously addressed with a pharmacist or physician: None reported  Questions for the pharmacist: No    Confirmed patient received a Conservation officer, historic buildings and a Surveyor, mining with first shipment. The patient will receive a drug information handout for each medication shipped and additional FDA Medication Guides as required.       DISEASE/MEDICATION-SPECIFIC INFORMATION        N/A    SPECIALTY MEDICATION ADHERENCE     Medication Adherence    Patient reported X missed doses in the last month: 0  Specialty Medication: Verzenio 100mg   Patient is on additional specialty medications: No  Patient is on more than two specialty medications: No  Any gaps in refill history greater than 2 weeks in the last 3 months: no  Demonstrates understanding of importance of adherence: yes  Informant: patient  Reliability of informant: reliable  Provider-estimated medication adherence level: good  Patient is at risk for Non-Adherence: No  Reasons for non-adherence: no problems identified  Confirmed plan for next specialty medication refill: delivery by pharmacy  Refills needed for supportive medications: yes, ordered or provider notified          Refill Coordination    Has the Patients' Contact Information Changed: No  Is the Shipping Address Different: No         Were doses missed due to medication being on hold? No    verzenio 100 mg: 7 days of medicine on hand         REFERRAL TO PHARMACIST     Referral to the pharmacist: Not needed      [facility]     Shipping address confirmed in Epic.     Delivery Scheduled: Yes, Expected medication delivery date: 05/31.  However, Rx request for refills was sent to the provider as there are none remaining.     Medication will be delivered via Next Day Courier to the prescription address in Epic WAM.    Antonietta Barcelona   [facility] Specialty Technician

## 2021-10-24 MED ORDER — ABEMACICLIB 100 MG TABLET
ORAL_TABLET | Freq: Two times a day (BID) | ORAL | 5 refills | 28 days | Status: CP
Start: 2021-10-24 — End: ?
  Filled 2021-10-29: qty 56, 28d supply, fill #0

## 2021-10-24 NOTE — Unmapped (Signed)
[facility] THERAPY SERVICES [place]  OUTPATIENT PHYSICAL THERAPY  10/24/2021  Note Type: Discharge Note       Patient Name: Tami Gibson  Date of Birth:[birth year]  Diagnosis:   Encounter Diagnoses   Name Primary?    Lymphedema syndrome, postmastectomy Yes    Malignant neoplasm of right breast in female, estrogen receptor positive, unspecified site of breast (CMS-HCC)     Scar condition and fibrosis of skin        Referring MD:  [name], MD     Date of Onset of Impairment-07/09/2021  Date PT Care Plan Established or Reviewed-07/30/2021  Date PT Treatment Started-07/30/2021       Session #:  11 of 11      Contraindications:  none  Precautions:  none  Red Flags:  history of cancer, right breast IB    Metastatic cancer:   no   Assessment/Progress toward goals:  Pt is progressing toward meeting goals, Patient's skin integrity is improving.  Skin is softening with treatment which allows for improved lymph uptake, and decreased risk of infection, Pt is demonstrating proficiency at self care of edema/lymphedema, and Pt is demonstrating continued limb/truncal swelling reduction and improve tissue integrity Limb volumes are WFL, pt in independent with self care, ready for discharge.         ASSESSMENT/Reason for Referral:   31 y.o. 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  Pt is s/p right breast cancer with mastectomy and tissue expander, ALND and radiation.  The patient was treated for lymphedema and recently discharged.  She began to note increased swelling in her right arm and trunk in the past 3 weeks.  She is to undergo reconstruction surgery in 2 weeks, and is concerned about her lymphedema worsening.  She has been consistently excellent in self care.   .  In addition to lymphedema , the patient is experiencing these side effects from cancer treatments: Radiation: Fibrosis/myofascial restrictions  Currently the right limb is 6.44 % bigger than the left limb.     Recommendations for treatment/garments:  1) Patient will obtain proper garments to address swelling and improve tissue integrity. Garment recommendations :May need to increase to 30-40 mm Hg RTW sleeve/glove, or go into custom sleeve, either Elvarex or Mediven 550 if swelling does not reduce with treatments   2) Pt will be seen clinically for rehabilitation to include treatments to address as needed: edema/lymphedema, pain, ROM, strength, balance and endurance deficits, as well as learning self care strategies to address these deficits  3) Pt has  a pneumatic compression pump  Patient requires skilled Physical Therapy services  for the following problem list and secondary functional limitations:    Problem List:   Uncontrolled swelling and/or lack of appropriate compression garment    Secondary Functional Limitations:  Uncontrolled swelling can increase chances of  infection, and limit functional ROM     Patient Goals: Decrease swelling    Physical Therapy Goals:   In 1-3 months: Pt and/or caregiver will:  MET 10-24-21    Decrease limb volume by 1-3 % for proper fit of clothing and to minimize infection risk x 1    In 6-12 months: Pt and/or caregiver will:    Retain an optimal limb volume that allows pt to fit into appropriate compression garments and clothing to improve functional use of UE and QOL        PLAN:     Discharge to self care     SUBJECTIVE:  Patient???s communication preference: verbal, written, visual, prn   History of Present Illness/ Pt reports:  No problems, resumed normal lifestyle    Location of pain: no pain    OBJECTIVE:      Posture/Observations:  Forward head, rounded shoulders , guarded posturing              Girth Measurements: taken in cm    Date: 2-28 2-28 3-23 3-23 4-19 4-19 5-25 5-25      Right Left RIght Left Right Left RIght Left RIght Left   MCP 18 18 17.6 17.2 15.5 15.5 18 17      Mid palm 20 19 19.8 17.5 20.2 20.6 18.5 18     Thumb DIP             1st finger DIP             1st finger PIP             2nd finger DIP             2nd finger PIP             3rd finger DIP             3rd finger PIP             4th finger DIP             4th finger PIP                          Axilla             Mid breast nipple line             Ribs below breast             Natural Waistline             Umbilicus             Hips at  cm below umbilicus             Limb Volume  Involved arm: Right   Dominant arm: Right   Patient position: Supine    Circumferences (cm)     Segment (cm) Right  10/24/2021 Left  10/24/2021   0 16.0 15.5   5 20.5 20.6   10 25.0 25.0   15 28.6 27.0   20 30.5 29.0   25 28.0 28.5   30 37.0 35.0   35 40.5 41.0   40 40.0 41.0   45 39.0 41.0        Involved arm: Right   Dominant arm: Right   Patient position: Supine    Volumes (mL)     Segment (cm) Right  10/24/2021 Left  10/24/2021   0 - 5 133.19 130.50   5 - 10 206.60 207.48   10 - 15 286.21 269.10   15 - 20 347.56 312.08   20 - 25 340.62 328.89   25 - 30 422.95 402.50   30 - 35 597.86 575.74   35 - 40 644.61 668.85   40 - 45 620.84 668.85              Inital values taken: 10/30/2020 11:47 AM   Involved arm: Right   Dominant arm: Right   Patient position: Supine   Total volume: right 3600.44 mL 10/24/2021  8:34 AM   Total volume: left 3563.99 mL 10/24/2021  8:34 AM   L/R difference +  36.45 mL +1.02 %   R diff from initial +707.32 mL +24.45 %   R diff from last +707.32 mL +24.45 %         ACOLS system:        Location of swelling:  right hand , wrist, forearm, elbow, arm, shoulder, axilla, chest, breast and ribs    Skin condition:  Stemmer???s sign:no.  increased skin thickness  Incision/Scar:   incision healing well postoperatively        Total Treatment Time: 53 min    Treatment Rendered:    Patient and/or caregiver and therapist were mask compliant per current [place] COVID mask policy during the entire treatment session.          Manual x 45 min   Axillary Web syndrome release , Myofascial release/Spontaneous Muscle Release Techniques, Lymphatouch to affected areas with simultaneous MFR, and MLD to appropriate anastomoses in order to decrease swelling.  Application of compression garment/devices prn to prevent re-accumulation of fluid.  Reviewed all self care during session, pt independent.     Therapeutic ex: 8 min: pulleys for ROM, rolling ball up wall, lean into ball,   Red theraband: 1 set of 10: rows, extension, ER/IR, overhead press, lateral raises, hug a tree   Equipment provided/recommended:   N/A    Communication/consultation with other professionals:  N/A    Referrals made to the following providers:  none    Medical Necessity: This treatment is medically necessary throughout the course of this patient's life during and after cancer treatments to improve functional activities and /or to minimize the decline of functional abilities and worsening of symptoms,including infection and recurrent hospitalizations, through independent/home management strategies.  Lymphedema, a chronic, progressive condition for which there is no cure, is marked by the accumulation of protein-rich fluid in one or more quadrants of the body due to primary or secondary disruption of the lymphatic system.  The sustained accumulation results in tissue inflammation, an increase in fatty tissue, and development of obstructive connective tissue.  These changes may result in an increased risk of infection, disfigurement and a decrease in mobility and functional performance.   Pt will benefit from physical therapy by a certified lymphedema therapist to address : education in self care, precautions, and management of symptoms, optimal edema reduction/maintenance of girth, optimal soft tissue changes, fitting of appropriate compression garments, and progression of exercises to optimize functional ROM and strength, balance, and endurance in all ADL's(home, work, recreational, community).      I attest that I have reviewed the above information.  Signed: Clementeen Graham, PT   10/24/2021 8:52 AM

## 2021-10-30 ENCOUNTER — Ambulatory Visit: Admit: 2021-10-30 | Payer: PRIVATE HEALTH INSURANCE

## 2021-10-30 ENCOUNTER — Telehealth: Admit: 2021-10-30 | Discharge: 2021-10-31 | Payer: PRIVATE HEALTH INSURANCE | Attending: Clinical | Primary: Clinical

## 2021-10-31 NOTE — Unmapped (Signed)
[facility]  Comprehensive Cancer Support Program   Telehealth Encounter    *non billable encounter*      Encounter Description: This encounter was conducted via telephone in the setting of State of Emergency due to COVID-19 Pandemic.     The patient reports they are currently: not at home. I spent 15 minutes on the phone with the patient on the date of service. I spent an additional 5 minutes on pre- and post-visit activities on the date of service.     The patient was physically located in [place] or a state in which I am permitted to provide care. The patient and/or parent/guardian understood that s/he may incur co-pays and cost sharing, and agreed to the telemedicine visit. The visit was reasonable and appropriate under the circumstances given the patient's presentation at the time.    The patient and/or parent/guardian has been advised of the potential risks and limitations of this mode of treatment (including, but not limited to, the absence of in-person examination) and has agreed to be treated using telemedicine. The patient's/patient's family's questions regarding telemedicine have been answered.     If the visit was completed in an ambulatory setting, the patient and/or parent/guardian has also been advised to contact their provider???s office for worsening conditions, and seek emergency medical treatment and/or call 911 if the patient deems either necessary.      Assessment:  Tami Gibson is a 31 y.o. female with a history of breast cancer. Referred by Tami Gibson, PT for support navigating survivorship and long term impact of treatment (lymphedema). She describes a normal and healthy emotional response to treatment and is interested in engaging in therapy as she transitions from active treatment to survivorship.     Risk Assessment:  A suicide and violence risk assessment was performed as part of this evaluation. There is no acute risk for suicide or violence at this time.  While future psychiatric events cannot be accurately predicted, the patient does not currently require acute inpatient psychiatric care and does not currently meet [facility] involuntary commitment criteria.       Plan:  Will continue to follow - Tami Gibson requested check-in at the end of the summer. Encouraged her to reach out sooner if needed.       Subjective:   Patient interviewed in a private place accompanied by no one. Shared updates since our last visit. Physically doing very well. Lymphedema has greatly improved since surgery. Notes some anxiety as things return to normal - worry that life can get derailed again. Looking forward to summer.  Provided supportive counseling, active listening, normalization, and psychoeducation.     Objective:    Mental Status Exam:  Speech/Language:    Normal rate, volume, tone, fluency   Mood:  euthymic   Thought process and Associations:   Logical, linear, clear, coherent, goal directed   Abnormal/psychotic thought content:     Denies SI, HI, self harm, delusions, obsessions, paranoid ideation, or ideas of reference   Perceptual disturbances:     Does not endorse auditory or visual hallucinations     Orientation:   Oriented to person, place, time, and general circumstances   Insight:     Intact   Judgment:    Intact   Impulse Control:   Intact     Tami Schmidt, LCSW  Comprehensive Cancer Support Program  Phone: [phone]  Pager: [phone]

## 2021-11-04 ENCOUNTER — Institutional Professional Consult (permissible substitution): Admit: 2021-11-04 | Discharge: 2021-11-05 | Payer: PRIVATE HEALTH INSURANCE

## 2021-11-04 DIAGNOSIS — C50911 Malignant neoplasm of unspecified site of right female breast: Principal | ICD-10-CM

## 2021-11-04 DIAGNOSIS — Z170 Estrogen receptor positive status [ER+]: Principal | ICD-10-CM

## 2021-11-04 MED ADMIN — leuprolide (LUPRON) injection 3.75 mg: 3.75 mg | INTRAMUSCULAR | @ 13:00:00 | Stop: 2021-11-04

## 2021-11-04 NOTE — Unmapped (Signed)
Pt tolerated Lupron 3.75mg  injection to right gluteal without difficulty. Band aid and gauze placed over injection sites. [facility] ambulatory,steady gait, NAD, no questions, complaints, nor concerns voiced at d/c.

## 2021-11-06 MED ORDER — LETROZOLE 2.5 MG TABLET
ORAL_TABLET | 0 refills | 0 days
Start: 2021-11-06 — End: ?

## 2021-11-06 MED ORDER — OMEPRAZOLE 20 MG CAPSULE,DELAYED RELEASE
ORAL_CAPSULE | 0 refills | 0 days
Start: 2021-11-06 — End: ?

## 2021-11-06 MED ORDER — ONDANSETRON 4 MG DISINTEGRATING TABLET
ORAL_TABLET | 0 refills | 0 days
Start: 2021-11-06 — End: ?

## 2021-11-06 NOTE — Unmapped (Signed)
Please refill if appropriate.    Most recent clinic visit: 09/04/2021    Next clinic visit: 12/11/2021

## 2021-11-07 ENCOUNTER — Ambulatory Visit
Admit: 2021-11-07 | Discharge: 2021-11-08 | Payer: PRIVATE HEALTH INSURANCE | Attending: Internal Medicine | Primary: Internal Medicine

## 2021-11-07 MED ORDER — ONDANSETRON 4 MG DISINTEGRATING TABLET
ORAL_TABLET | Freq: Three times a day (TID) | ORAL | 3 refills | 10.00000 days | Status: CP | PRN
Start: 2021-11-07 — End: ?

## 2021-11-07 MED ORDER — OMEPRAZOLE 20 MG CAPSULE,DELAYED RELEASE
ORAL_CAPSULE | Freq: Every day | ORAL | 0 refills | 0.00000 days | Status: CP
Start: 2021-11-07 — End: ?

## 2021-11-07 MED ORDER — LETROZOLE 2.5 MG TABLET
ORAL_TABLET | Freq: Every day | ORAL | 0 refills | 0.00000 days | Status: CP
Start: 2021-11-07 — End: ?

## 2021-11-07 NOTE — Unmapped (Signed)
Patient ID: Tami Gibson is a 31 y.o. female who presents for new concerns of sore throat    Informant: Patient came to appointment alone.    Assessment/Plan:      1. Sore throat  Rapid strep negative. No exudates or signs of abscess seen on exam. Does have some throat clearing, which may be behavioral tic vs effect of post-nasal drip from allergies. No indication for antibiotics. Will try Flonase nasal spray for 3-4 weeks to reduce post-nasal drip. Do warm salty water gargles 3-4 times per day and NSAIDs.   - POCT Rapid Group A Strep      Preventive services addressed today  We did not review preventive services today    -- Patient verbalized an understanding of today's assessment and recommendations, as well as the purpose of ongoing medications.    No follow-ups on file.    Medication adherence and barriers to the treatment plan have been addressed. Opportunities to optimize healthy behaviors have been discussed. Patient / caregiver voiced understanding.        Subjective:     HPI  -Has had almost 3 weeks of sore throat, more on R side of throat. Hurts to swallow anything. No fevers, chills. Her allergies have actually been better since being on Zyrtec. No post-nasal drip or congestion other than mild mucous in the morning.     ROS  As per HPI.    Outpatient Medications Prior to Visit   Medication Sig Dispense Refill    abemaciclib (VERZENIO) 100 mg Tab tablet Take 1 tablet (100 mg total) by mouth Two (2) times a day. Take with or without food. 56 tablet 5    acetaminophen (TYLENOL) 325 MG tablet Take 2 tablets (650 mg total) by mouth every six (6) hours as needed for pain.      calcium carbonate (TUMS) 200 mg calcium (500 mg) chewable tablet Chew 1 tablet (200 mg of elem calcium total) daily.      cholecalciferol, vitamin D3-25 mcg, 1,000 unit,, 25 mcg (1,000 unit) capsule Take 1 capsule (25 mcg total) by mouth.      cyanocobalamin 100 MCG tablet Take 1 tablet (100 mcg total) by mouth daily.      docusate sodium (COLACE) 100 MG capsule Take 1 capsule (100 mg total) by mouth nightly as needed (PRN).      ergocalciferol-1,250 mcg, 50,000 unit, (DRISDOL) 1,250 mcg (50,000 unit) capsule Take 1 capsule (1,250 mcg total) by mouth once a week.      escitalopram oxalate (LEXAPRO) 20 MG tablet Take 1 tablet (20 mg total) by mouth daily. 90 tablet 3    letrozole (FEMARA) 2.5 mg tablet Take 1 tablet (2.5 mg total) by mouth daily. 90 tablet 3    leuprolide (LUPRON) 3.75 mg injection Inject 3.75 mg into the muscle.      omeprazole (PRILOSEC) 20 MG capsule Take 1 capsule (20 mg total) by mouth daily. 90 capsule 0    ondansetron (ZOFRAN-ODT) 4 MG disintegrating tablet Take 1 tablet (4 mg total) by mouth every eight (8) hours as needed for nausea. 30 tablet 3    rimegepant (NURTEC ODT) 75 mg TbDL Take 1 tablet (75 mg total) by mouth.      semaglutide (OZEMPIC) 1 mg/dose (4 mg/3 mL) PnIj injection Inject 1 mg under the skin every seven (7) days. 3 mL 3    SUMAtriptan (IMITREX) 100 MG tablet TAKE ONE TABLET BY MOUTH AT ONSET OF MIGRAINE FOR UP TO 1 DOSE . IF  SYMPTOMS PERSIST A SECOND DOSE MAY BE TAKEN IN 2 HOURS IF NEEDED      topiramate (TOPAMAX) 25 MG tablet TAKE 3 TABLETS BY MOUTH ONCE DAILY      traZODone (DESYREL) 50 MG tablet Take 0.5-1 tablets (25-50 mg total) by mouth nightly as needed for sleep. (for sleep) 30 tablet 5    vitamin E-134 mg, 200 UNIT, 134 mg (200 UNIT) capsule Take 1 capsule (134 mg total) by mouth daily.      calcium carbonate (OS-CAL) 500 mg calcium (1,250 mg) chewable tablet Chew. (Patient not taking: Reported on 11/07/2021)      fexofenadine (ALLEGRA) 180 MG tablet Take 1 tablet (180 mg total) by mouth. (Patient not taking: Reported on 11/07/2021)      montelukast (SINGULAIR) 10 mg tablet Take 1 tablet (10 mg total) by mouth nightly. 30 tablet 0     Facility-Administered Medications Prior to Visit   Medication Dose Route Frequency Provider Last Rate Last Admin    leuprolide (LUPRON) 3.75 mg injection The following portions of the patient's history were reviewed and updated as appropriate: allergies, current medications, past medical history, past social history, and problem list.          Objective:       Vital Signs  BP 124/86 (BP Site: L Arm, BP Position: Sitting, BP Cuff Size: Medium)  - Pulse 101  - Temp 36.4 ??C (97.5 ??F) (Temporal)  - Wt 99.9 kg (220 lb 3.2 oz)  - SpO2 97%  - BMI 35.54 kg/m??      Exam  Physical Exam  Constitutional:       Appearance: Normal appearance.   HENT:      Nose: No congestion.      Mouth/Throat:      Mouth: Mucous membranes are moist.      Pharynx: Oropharynx is clear. No oropharyngeal exudate.      Comments: Mild posterior pharyngeal erythema, no edema  Lymphadenopathy:      Cervical: No cervical adenopathy.   Neurological:      Mental Status: She is alert.

## 2021-11-18 NOTE — Unmapped (Unsigned)
Plastic Surgery  Clinic Follow-Up Note      Patient Name: Tami Gibson  Patient Age: 31 y.o.  Encounter Date: 11/20/2021    Referring Physician: Rudie Meyer, ANP  [address]  [facility]; [address] [place],  [place] [address]  Primary Care Physician: Durenda Hurt, MD        Assessment:  Tami Gibson is a 31 y.o. female ***        Recommendations/Plan:  - ***        History of Present Illness:     Tami Gibson is a 31 y.o. female ***.        Physical Exam  Neuro: alert and oriented, pain well controlled  Pulmonary: Normal respiratory effort.   Cardiovascular: Regular rate.   Extr: Warm/well perfused, no edema  ***

## 2021-11-18 NOTE — Unmapped (Signed)
[name] Refill Coordination Note    Specialty Medication(s) to be Shipped:   Hematology/Oncology: Verzenio 100mg     Other medication(s) to be shipped: No additional medications requested for fill at this time     Tami Gibson, DOB: 1991-02-23  Phone: [phone] (home)       All above HIPAA information was verified with patient.     Was a Nurse, learning disability used for this call? No    Completed refill call assessment today to schedule patient's medication shipment from the [facility] [phone]).  All relevant notes have been reviewed.     Specialty medication(s) and dose(s) confirmed: Regimen is correct and unchanged.   Changes to medications: Tami Gibson reports no changes at this time.  Changes to insurance: No  New side effects reported not previously addressed with a pharmacist or physician: None reported  Questions for the pharmacist: No    Confirmed patient received a Conservation officer, historic buildings and a Surveyor, mining with first shipment. The patient will receive a drug information handout for each medication shipped and additional FDA Medication Guides as required.       DISEASE/MEDICATION-SPECIFIC INFORMATION        N/A    SPECIALTY MEDICATION ADHERENCE     Medication Adherence    Patient reported X missed doses in the last month: 0  Specialty Medication: Verzenio 100mg   Patient is on additional specialty medications: No  Informant: patient              Were doses missed due to medication being on hold? No    Verzenio 100 mg: 10 days of medicine on hand       REFERRAL TO PHARMACIST     Referral to the pharmacist: Not needed      [facility]     Shipping address confirmed in Epic.     Delivery Scheduled: Yes, Expected medication delivery date: 11/27/21.     Medication will be delivered via Next Day Courier to the prescription address in Epic WAM.    Tami Gibson   [facility] Specialty Technician

## 2021-11-26 MED FILL — VERZENIO 100 MG TABLET: ORAL | 28 days supply | Qty: 56 | Fill #1

## 2021-11-29 ENCOUNTER — Ambulatory Visit: Admit: 2021-11-29 | Discharge: 2021-11-29 | Payer: PRIVATE HEALTH INSURANCE

## 2021-11-29 NOTE — Unmapped (Addendum)
Your blood counts have looked great.    You'll be due for an echo in 2026.    You'll be due for bone density scan in 2024.    You can try KEY-E vaginal suppositories for vaginal dryness. Wear a pantiliner.    We'll plan to see you in one year.

## 2021-12-01 NOTE — Unmapped (Signed)
[facility]: New Patient    Name: Tami Gibson  Age: 31 y.o.  Date: 11/29/2021    Primary Care Provider:  Durenda Hurt, MD    Referring Provider:  Dr. Adonis Gibson    Reason for visit:   Ongoing AYA-focused cancer survivorship care    Assessment:  Tami Gibson is a 31 y.o. yo female with a history of Stage IB (cT1c, cN1, cM0, G1, ER+, PR+, HER2-) right breast cancer. She has been off active therapy (chemo, surgery, radiation) since 3/22 and is now receiving hormone-directed + CDK inhibitor. She presents for evaluation and recommendations for cancer survivorship care.     Today was a follow up visit for a symptom and psychosocial check in. Tami Gibson underwent breast implants in March and has been very happy with the result. She will be having a slight revision done later this year. From a mood perspective, Tami Gibson is also faring well. She continues on lexapro (dose increased in March d/t increased anxiety around changes in plan for surgery) and has PRN trazodone for sleep, which she needed frequently around that time, but is not using often at this point. She continues seeing LCSW Tami Gibson, but has moved to a less frequent schedule. And she is connected with other young breast survivors locally. We discussed timing for next echo and bone scans as well as sexual health concerns.    Plan Summary:  - Return to AYA survivorship clinic in one year (may reach out at any point in the interim)  - Continue ongoing care with breast oncologist Dr. Laney Gibson including management of lupron, letrozole and abemiciclib  - Labs: CBC w/ diff 09/04/21 WNL; cr 1.08 stable; normal hepatic function  - Next echo 2026  - Next DEXA 2024 (sent message to Dr. Laney Gibson to offer to order this test)  - Continue lexapro and one-on-one therapy with LCSW Tami Gibson  - May try Tami Gibson KEY-E vaginal suppositories for moisturization     Plan Detail:  Potential long-term adverse effects include:    Secondary Cancer (Doxorubicin, Cyclophosphamide, Radiation): - no concerning symptoms, CBC 4/23 normal  - Annual survivorship evaluation including risk-based history, physical exam, CBC.   - Recommend annual skin check and use of SPF >=30 sunscreen    Early heart disease (Doxorubicin, Radiation): CAD, arrhythmias or cardiomyopathy; TAD 240mg /m2  - EKG: baseline following completion of therapy (8/21: normal sinus rhythm, nonspecific T wave abnormality)  - Echocardiogram: following completion of therapy (10/21 WNL EF 55-60%) [and every 5 years (COG LTFU Guidelines)]; baseline study prior to treatment (7/21 - WNL EF 55%) - next due 2026  - BP today in clinic 111/78, within goal range  Attention should be given to CVD risk factors including the maintenance of a healthy weight (today's BMI is Body mass index is 34.7 kg/m??. which is classified as obese), participation in regular exercise, consumption of a healthy diet, limitation of alcohol consumption, and avoidance of tobacco use. Evaluation for dyslipidemia and diabetes should be performed by the patient's PCP as recommended.  - We discussed importance of regular physical activity such as walking  - Rylin is working towards some weight loss with Ozempic, but has not yet seen benefit    Decreased fertility/premature menopause/sexual dysfunction (Cyclophosphamide): no further children desired. Lupron + condoms for contraception. Vaginal dryness, dyspareunia and low libido/anorgasmia currently. She uses Uberlube but has not wanted to try coconut oil for moisturization.  - Discussed alternative moisturization approach - using Tami Gibson KEY-E vaginal suppositories.  Discussed that systemic absorption of Vit E likely not high. Suggested trying vibrator additionally.  - Oral Vitamin E has helped significantly with medication-related hot flashes; also uses fans/temperature control    Bladder problems (Cyclophosphamide): no complaints    Peripheral neuropathies (Paclitaxel): no complaints    Scarring of lung tissue (Radiation): no SOB/cough    Decreased bone density (letrozole, lupron): baseline DEXA 09/12/20 showed normal bone mineral density.  - Being managed by breast oncologist given ongoing aromatase inhibitor. On vitamin D/calcium supplementation. Next DEXA due 2024 per breast oncologist.    Cognitive difficulties: no complaints today; previously noted strategies she uses to manage chemobrain symptoms    Mood and anxiety disorders: currently doing well. On increased dose of lexapro (prescribed by PCP). Ongoing psychotherapy with Tami Gibson.    Tissue fibrosis/skin changes (Radiation): has some fibrotic tissue in area of radiation to right breast; has rad onc f/u in September    Lymphedema (Surgery): improved since having implant surgery in March. Not needing overnight apparatus. Completed PT. Uses exercises and massage to help with less problematic swelling    Dental problems:  - recommend twice annual dental checks and cleanings    Health Maintenance:   - A standard cancer screening schedule should be followed for the patient including annual physical with routine screening (PAP smear, glucose, lipids, BP)    I personally spent 30 minutes face-to-face and non-face-to-face in the care of this patient, which includes all pre, intra, and post visit time on the date of service.  All documented time was specific to the E/M visit and does not include any procedures that may have been performed.    As I was delayed 45 minutes for the start of our appointment, I will not charge her for the visit.    Tami Gibson, AGPCNP-BC  Nurse Practitioner  [facility] Cancer [facility]  [facility]  12/01/2021    HPI:  Oncology History Overview Note   31 year old female with recently self palpated right breast lump. A targeted right breast ultrasound on 11/15/2019 showed an irregular mass with spiculated margins at the 7:00 position, 6 cm from the nipple, measuring 1.4 x 1.2 x 1.0 cm. There were 2 abnormal level 1 axillary lymph nodes measuring 1.4 x 1.3 cm and 1.2 x 0.9 cm. Additionally, there was a high level 1/level 2 axillary lymph node, measuring 2.1 x 1.3 cm with an effaced fatty hilum.      Malignant neoplasm of right breast in female, estrogen receptor positive (CMS-HCC)   11/09/2019 Initial Diagnosis    Malignant neoplasm of right breast in female, estrogen receptor positive (CMS-HCC)       11/15/2019 Biopsy    Invasive ductal carcinoma  - Nottingham combined histologic grade: 1  ER 91-100%, PR 91-100%, HER2 negative by IHC    Lymph node, right axilla, core biopsy  - Lymph node positive for metastatic carcinoma,      12/02/2019 -  Cancer Staged    Staging form: Breast, AJCC 8th Edition  - Clinical: Stage IB (cT1c, cN1, cM0, G1, ER+, PR+, HER2-) - Signed by [name], MD on 12/02/2019           12/09/2019 - 03/22/2020 Chemotherapy    OP BREAST AC (Dose Dense) q2W X 4 CYCLES, THEN PACLITAXEL (DOSE DENSE) Q2W x 4 CYCLES  DOXOrubicin 60 mg/m2 IV on day 1, cyclophosphamide 600 mg/m2 IV on day 1, every 2 weeks for 4 cycles, then PACLitaxel 175 mg/m2 every  2 weeks for 4 cycles        04/20/2020 Surgery    Breast, right, simple mastectomy  -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 Extracapsular extension is identified      Breast, left, simple mastectomy  -Benign breast tissue with no atypia, in situ or invasive carcinoma identified     04/20/2020 -  Cancer Staged    Staging form: Breast, AJCC 8th Edition  - Pathologic stage from 04/20/2020: No Stage Recommended (ypT1c, pN1a, cM0, G2, ER+, PR+, HER2-) - Signed by [name], MD on 04/25/2020           05/07/2020 Surgery    Lymph nodes, right axillary, dissection  -One of sixteen lymph nodes involved by metastatic adenocarcinoma (1/16)  -Metastatic focus measures 3 mm in greatest dimension  -No extracapsular extension is identified     06/28/2020 - 08/06/2020 Radiation    The total radiation dose will be 5000 cGy at 200 cGy/fraction for a total of 25 fractions, treated once a day     08/13/2020 Endocrine/Hormone Therapy    Letrozole 2.5mg  +Ovarian suppression (monthly lupron)     Malignant neoplasm of right breast in female, estrogen receptor positive (CMS-HCC)   05/07/2020 Surgery    Lymph nodes, right axillary, dissection  -One of sixteen lymph nodes involved by metastatic adenocarcinoma (1/16)  -Metastatic focus measures 3 mm in greatest dimension  -No extracapsular extension is identified     06/13/2020 -  Radiation    Radiation Therapy Treatment Details (Noted on 06/13/2020)  Site: Right Breast - Overlapping sites  Technique: 3D CRT  Goal: No goal specified  Planned Treatment Start Date: No planned start date specified       06/21/2020 -  Radiation    Radiation Therapy Treatment Details (Noted on 06/21/2020)  Site: Right Breast - Overlapping sites  Technique: 3D CRT  Goal: No goal specified  Planned Treatment Start Date: No planned start date specified         SUMMARY OF CANCER TREATMENT:  Diagnosis:  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+, HER2-)  Date of Diagnosis: 11/2019   Date off Therapy: chemo complete 10/21, surgery 11/21; 12/21, radiation 3/22; ovarian suppression (letrozole/lupron)/abemaciclib ongoing  Chemotherapy: ddAC-T (doxorubicin, cyclophosphamide, paclitaxel)    Total Anthracycline Dose:  240 mg/m2  Total Alkylator Dose: 2.4 g/m2    Radiation Therapy: 5,000 cGy right breast and regional nodes (undissected axilla, SCV, IMNs)  Surgery:  Bilateral skin-sparing mastectomy, right targeted axillary dissection (TAD), port removal (04/20/2020); right axillary lymph node dissection (05/07/2020); tissue expander to implant exchange 08/13/21    Interval History:  Mae has been doing well. Her implants feel really comfortable. The right one is a little high, so she'll be getting that modified. Her lymphedema has been much improved since she had the initial implant surgery. She hasn't had to use the nighttime compression. 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 And she stretches her arm while she's driving the car.    Found out before surgery that she couldn't do the expected DEIP because of her BMI. This was very upsetting to her and her anxiety/trouble sleeping increased. Her PCP increased her lexapro dose at that time and gave her trazodone to help her sleep. She ended up  going with implants and now feels it was all for the best. Started on Ozempic to help with weight loss but so far has not seen benefit. She and Rose decided at her last appointment to meet less frequently.    She is still having vaginal dryness and discomfort. She is just having sex because she knows it's important for her husband. She felt a little weird about using coconut oil she would buy at the grocery store.     She met some other AYA breast survivors at a conference and will be getting together with them for an outing. She is really enjoying her job in [employer] in the [employer]. [place] and she's excited for that time off.    Past Medical, Surgical, Social and Family History were reviewed and pertinent updates were made in the Electronic Medical Record    MEDS:    Current Outpatient Medications on File Prior to Encounter   Medication Sig    abemaciclib (VERZENIO) 100 mg Tab tablet Take 1 tablet (100 mg total) by mouth Two (2) times a day. Take with or without food.    acetaminophen (TYLENOL) 325 MG tablet Take 2 tablets (650 mg total) by mouth every six (6) hours as needed for pain.    calcium carbonate (TUMS) 200 mg calcium (500 mg) chewable tablet Chew 1 tablet (200 mg of elem calcium total) daily.    cholecalciferol, vitamin D3-25 mcg, 1,000 unit,, 25 mcg (1,000 unit) capsule Take 1 capsule (25 mcg total) by mouth.    cyanocobalamin 100 MCG tablet Take 1 tablet (100 mcg total) by mouth daily.    docusate sodium (COLACE) 100 MG capsule Take 1 capsule (100 mg total) by mouth nightly as needed (PRN).    escitalopram oxalate (LEXAPRO) 20 MG tablet Take 1 tablet (20 mg total) by mouth daily.    letrozole (FEMARA) 2.5 mg tablet Take 1 tablet by mouth once daily    letrozole (FEMARA) 2.5 mg tablet Take 1 tablet (2.5 mg total) by mouth daily.    leuprolide (LUPRON) 3.75 mg injection Inject 3.75 mg into the muscle.    omeprazole (PRILOSEC) 20 MG capsule Take 1 capsule by mouth once daily    omeprazole (PRILOSEC) 20 MG capsule Take 1 capsule (20 mg total) by mouth daily.    ondansetron (ZOFRAN-ODT) 4 MG disintegrating tablet DISSOLVE 1 TABLET IN MOUTH EVERY 8 HOURS AS NEEDED FOR NAUSEA    ondansetron (ZOFRAN-ODT) 4 MG disintegrating tablet Take 1 tablet (4 mg total) by mouth every eight (8) hours as needed for nausea.    rimegepant (NURTEC ODT) 75 mg TbDL Take 1 tablet (75 mg total) by mouth.    semaglutide (OZEMPIC) 1 mg/dose (4 mg/3 mL) PnIj injection Inject 1 mg under the skin every seven (7) days.    SUMAtriptan (IMITREX) 100 MG tablet TAKE ONE TABLET BY MOUTH AT ONSET OF MIGRAINE FOR UP TO 1 DOSE . IF SYMPTOMS PERSIST A SECOND DOSE MAY BE TAKEN IN 2 HOURS IF NEEDED    topiramate (TOPAMAX) 25 MG tablet TAKE 3 TABLETS BY MOUTH ONCE DAILY    traZODone (DESYREL) 50 MG tablet Take 0.5-1 tablets (25-50 mg total) by mouth nightly as needed for sleep. (for sleep)    vitamin E-134 mg, 200 UNIT, 134 mg (200 UNIT) capsule Take 1 capsule (134 mg total) by mouth daily.     Current Facility-Administered Medications on File Prior to Encounter  Medication    leuprolide (LUPRON) 3.75 mg injection     ALLERGIES:  No Known Allergies    Physical Exam:  Vitals:    11/29/21 0954   BP: 111/78   Pulse: 96   Resp: 18   Temp: 36.8 ??C (98.3 ??F)   TempSrc: Oral   SpO2: 97%   Weight: 97.5 kg (215 lb) GENERAL: alert and interactive, appropriately responsive, NAD  HEENT: sclerae clear  PULM: normal effort and rate, no stridor  CV: not examined  EXT: no swelling or edema  NEURO: normal mental status, CN intact, normal gait and cerebellar function.      Labs:    No visits with results within 1 Day(s) from this visit.   Latest known visit with results is:   Office Visit on 11/07/2021   Component Date Value Ref Range Status    Rapid Strep A Screen 11/07/2021 Negative  Negative Final    Rapid Strep A Internal QC 11/07/2021 QC Acceptable   Final    Rapid Strep A Lot 11/07/2021 [id number]   Final    Rapid Strep A Expiration Date 11/07/2021 02/03/2023   Final

## 2021-12-04 DIAGNOSIS — Z79818 Long term (current) use of other agents affecting estrogen receptors and estrogen levels: Principal | ICD-10-CM

## 2021-12-04 DIAGNOSIS — C50911 Malignant neoplasm of unspecified site of right female breast: Principal | ICD-10-CM

## 2021-12-04 DIAGNOSIS — Z170 Estrogen receptor positive status [ER+]: Principal | ICD-10-CM

## 2021-12-11 ENCOUNTER — Ambulatory Visit: Admit: 2021-12-11 | Discharge: 2021-12-11 | Payer: PRIVATE HEALTH INSURANCE

## 2021-12-11 ENCOUNTER — Institutional Professional Consult (permissible substitution): Admit: 2021-12-11 | Discharge: 2021-12-11 | Payer: PRIVATE HEALTH INSURANCE

## 2021-12-11 ENCOUNTER — Other Ambulatory Visit: Admit: 2021-12-11 | Discharge: 2021-12-11 | Payer: PRIVATE HEALTH INSURANCE

## 2021-12-11 DIAGNOSIS — Z170 Estrogen receptor positive status [ER+]: Principal | ICD-10-CM

## 2021-12-11 DIAGNOSIS — C50911 Malignant neoplasm of unspecified site of right female breast: Principal | ICD-10-CM

## 2021-12-11 LAB — CBC W/ AUTO DIFF
BASOPHILS ABSOLUTE COUNT: 0 10*9/L (ref 0.0–0.1)
BASOPHILS RELATIVE PERCENT: 0.6 %
EOSINOPHILS ABSOLUTE COUNT: 0 10*9/L (ref 0.0–0.5)
EOSINOPHILS RELATIVE PERCENT: 0.9 %
HEMATOCRIT: 33.8 % — ABNORMAL LOW (ref 34.0–44.0)
HEMOGLOBIN: 11.6 g/dL (ref 11.3–14.9)
LYMPHOCYTES ABSOLUTE COUNT: 1.1 10*9/L (ref 1.1–3.6)
LYMPHOCYTES RELATIVE PERCENT: 34.1 %
MEAN CORPUSCULAR HEMOGLOBIN CONC: 34.4 g/dL (ref 32.0–36.0)
MEAN CORPUSCULAR HEMOGLOBIN: 28 pg (ref 25.9–32.4)
MEAN CORPUSCULAR VOLUME: 81.2 fL (ref 77.6–95.7)
MEAN PLATELET VOLUME: 7 fL (ref 6.8–10.7)
MONOCYTES ABSOLUTE COUNT: 0.3 10*9/L (ref 0.3–0.8)
MONOCYTES RELATIVE PERCENT: 9.5 %
NEUTROPHILS ABSOLUTE COUNT: 1.7 10*9/L — ABNORMAL LOW (ref 1.8–7.8)
NEUTROPHILS RELATIVE PERCENT: 54.9 %
PLATELET COUNT: 172 10*9/L (ref 150–450)
RED BLOOD CELL COUNT: 4.16 10*12/L (ref 3.95–5.13)
RED CELL DISTRIBUTION WIDTH: 16.3 % — ABNORMAL HIGH (ref 12.2–15.2)
WBC ADJUSTED: 3.1 10*9/L — ABNORMAL LOW (ref 3.6–11.2)

## 2021-12-11 LAB — COMPREHENSIVE METABOLIC PANEL
ALBUMIN: 3.9 g/dL (ref 3.4–5.0)
ALKALINE PHOSPHATASE: 87 U/L (ref 46–116)
ALT (SGPT): 13 U/L (ref 10–49)
ANION GAP: 9 mmol/L (ref 5–14)
AST (SGOT): 17 U/L (ref <=34)
BILIRUBIN TOTAL: 0.4 mg/dL (ref 0.3–1.2)
BLOOD UREA NITROGEN: 13 mg/dL (ref 9–23)
BUN / CREAT RATIO: 12
CALCIUM: 9.4 mg/dL (ref 8.7–10.4)
CHLORIDE: 108 mmol/L — ABNORMAL HIGH (ref 98–107)
CO2: 25 mmol/L (ref 20.0–31.0)
CREATININE: 1.08 mg/dL — ABNORMAL HIGH
EGFR CKD-EPI (2021) FEMALE: 70 mL/min/{1.73_m2} (ref >=60)
GLUCOSE RANDOM: 70 mg/dL (ref 70–179)
POTASSIUM: 3.7 mmol/L (ref 3.4–4.8)
PROTEIN TOTAL: 7 g/dL (ref 5.7–8.2)
SODIUM: 142 mmol/L (ref 135–145)

## 2021-12-11 MED ADMIN — leuprolide (LUPRON) injection 3.75 mg: 3.75 mg | INTRAMUSCULAR | @ 20:00:00 | Stop: 2021-12-11

## 2021-12-11 NOTE — Unmapped (Signed)
Pt received lupron 3.75mg  injection in Left upper outer Dorsal gluteal. Tolerated it well. Applied guaze and band aid.

## 2021-12-11 NOTE — Unmapped (Signed)
Breast Oncology Return Patient Evaluation  Referring Physician: None Per Patient Pcp  [address]  [place],  [place] [address].  PCP: Durenda Hurt, MD  Breast Med/Onc: Adonis Brook, MD    Cancer Team  Surgical Oncology: Jobe Marker, MD  Radiation Oncology: Rayetta Humphrey, MD  Medical Oncology: Adonis Brook, MD  Plastic Surgery: Levan Hurst, MD  Genetics: Audree Camel, MD    Reason for Visit: A 31 y.o. female with breast cancer referred for consultation for recommendations concerning the management of breast cancer.    Assessment/Plan:    Cancer Staging   Malignant neoplasm of right breast in female, estrogen receptor positive (CMS-HCC)  Staging form: Breast, AJCC 8th Edition  - Clinical: Stage IB (cT1c, cN1, cM0, G1, ER+, PR+, HER2-) - Signed by [name], MD on 12/02/2019  - Pathologic stage from 04/20/2020: No Stage Recommended (ypT1c, pN1a, cM0, G2, ER+, PR+, HER2-) - Signed by [name], MD on 04/25/2020     #Stage IB (cT1c, cN1, cM0, G1, ER+, PR+, HER2-)right breast cancer    Neoadjuvant chemotherapy  Completed ddAC followed by DDTaxol on 03/21/20    Locoregional therapy  -s/p Right mastectomy with SLN and prophylactic left mastectomy  -Pathology consistent with residual disease, MD Dareen Piano residual cancer burden class: RCB-III  With 2/3 positive LNs    - ALND completed on 05/07/2020-1/16 lymph nodes involved. Total 3 LNs involved by metastatic adenocarcinoma  -Completed adjuvant RT on 08/06/2020  -Completed tissue expander exchange for permanent implants, liposuction from abdomen and fat grafting to bilateral breasts on 08/13/2021    Adjuvant therapy  Ovarian suppression (lupron) + Letrozole - SOT 08/09/2020  Adjuvant Abemaciclib - SOT 08/29/2020. Dose reduced to 100mg  BID in June 2022 to improve tolerance. She reports tolerating well.   -Continue Abemaciclib 100 mg BID     Staging scans  -Patient had baseline staging scans which are negative for any metastatic disease. -Repeat scans on March 2022 showed no evidence of metastasis; ( this was completed in the setting of the patient needing reassurance)     #Hot flashes  -Patient endorses hot flashes since initiation of aromatase inhibition therapy; she reports taking vit E which has been helpful.   -Discussed pharmacologic options including oxybutynin, gabapentin, or cross-tapering escitalopram (managed by PCP for anxiety) for venlafaxine, but patient continues opts for no pharmacologic therapy at this time    #Syncope/Presyncope  -Patient has had multiple episodes of presyncope with tunnel vision and one episode of syncope with preceding tunnel vision and lightheadedness all occurring approximately 4 to 5 days after chemo infusions.  No positional component and she is trying to stay well-hydrated.   -Extensive work-up has been unrevealing, including labs, chest CTA, brain MRI, CT head.  TTE prior to start of chemotherapy showed structurally normal heart. EKG was also normal at the time and 2 week Zio-patch w/o evidence of any concerning arrhythmia  -Overall, presentation seems most consistent with vasovagal etiology, unlikely to be cardiogenic. No symptoms since she was done with [facility].  - Cardiology following - followup limited TTE unremarkable  -Resolved    # Bone health  -Baseline bone mineral density evaluation prior to starting an aromatase inhibitor was normal  -Repeat in March 2024      # Supportive care  - Psychosocial: Supportive mother and husband. Follows with [facility] and [facility]  -Heartburn: Daily PPI prescribed  -Hotflashes: as above  -Lymphedema: she notes less lymphedema and less restriction in her right  arm since the fat grafting  -Diarrhea: Imodium as needed.  No complaints today      DISPO:  --Fu in 3 months, labs prior  --Continue lupron monthly with nurse visits    -----------------------------------------------------------------------------------------------------------------------------------------------    HPI: Tami Gibson is a 31 y.o. female who is seen for routine follow-up of management of breast cancer.     Interval History:  She is tolerating abemaciclib well at current dose.  Other than fatigue, she has been doing really well. No fever/chills, no SOB or cough, no nausea/vomiting or abdominal pain    They have been spending the summer at [facility] with family.       --Performance status=0    Breast Cancer History  Oncology History Overview Note   31 year old female with recently self palpated right breast lump. A targeted right breast ultrasound on 11/15/2019 showed an irregular mass with spiculated margins at the 7:00 position, 6 cm from the nipple, measuring 1.4 x 1.2 x 1.0 cm. There were 2 abnormal level 1 axillary lymph nodes measuring 1.4 x 1.3 cm and 1.2 x 0.9 cm. Additionally, there was a high level 1/level 2 axillary lymph node, measuring 2.1 x 1.3 cm with an effaced fatty hilum.      Malignant neoplasm of right breast in female, estrogen receptor positive (CMS-HCC)   11/09/2019 Initial Diagnosis    Malignant neoplasm of right breast in female, estrogen receptor positive (CMS-HCC)       11/15/2019 Biopsy    Invasive ductal carcinoma  - Nottingham combined histologic grade: 1  ER 91-100%, PR 91-100%, HER2 negative by IHC    Lymph node, right axilla, core biopsy  - Lymph node positive for metastatic carcinoma,      12/02/2019 -  Cancer Staged    Staging form: Breast, AJCC 8th Edition  - Clinical: Stage IB (cT1c, cN1, cM0, G1, ER+, PR+, HER2-) - Signed by [name], MD on 12/02/2019           12/09/2019 - 03/22/2020 Chemotherapy    OP BREAST AC (Dose Dense) q2W X 4 CYCLES, THEN PACLITAXEL (DOSE DENSE) Q2W x 4 CYCLES  DOXOrubicin 60 mg/m2 IV on day 1, cyclophosphamide 600 mg/m2 IV on day 1, every 2 weeks for 4 cycles, then PACLitaxel 175 mg/m2 every 2 weeks for 4 cycles        04/20/2020 Surgery    Breast, right, simple mastectomy  -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 Extracapsular extension is identified      Breast, left, simple mastectomy  -Benign breast tissue with no atypia, in situ or invasive carcinoma identified     04/20/2020 -  Cancer Staged    Staging form: Breast, AJCC 8th Edition  - Pathologic stage from 04/20/2020: No Stage Recommended (ypT1c, pN1a, cM0, G2, ER+, PR+, HER2-) - Signed by [name], MD on 04/25/2020           05/07/2020 Surgery    Lymph nodes, right axillary, dissection  -One of sixteen lymph nodes involved by metastatic adenocarcinoma (1/16)  -Metastatic focus measures 3 mm in greatest dimension  -No extracapsular extension is identified     06/28/2020 - 08/06/2020 Radiation    The total radiation dose will be 5000 cGy at 200 cGy/fraction for a total of 25 fractions, treated once a day     08/13/2020 Endocrine/Hormone  Therapy    Letrozole 2.5mg  +Ovarian suppression (monthly lupron)     Malignant neoplasm of right breast in female, estrogen receptor positive (CMS-HCC)   05/07/2020 Surgery    Lymph nodes, right axillary, dissection  -One of sixteen lymph nodes involved by metastatic adenocarcinoma (1/16)  -Metastatic focus measures 3 mm in greatest dimension  -No extracapsular extension is identified     06/13/2020 -  Radiation    Radiation Therapy Treatment Details (Noted on 06/13/2020)  Site: Right Breast - Overlapping sites  Technique: 3D CRT  Goal: No goal specified  Planned Treatment Start Date: No planned start date specified       06/21/2020 -  Radiation    Radiation Therapy Treatment Details (Noted on 06/21/2020)  Site: Right Breast - Overlapping sites  Technique: 3D CRT  Goal: No goal specified  Planned Treatment Start Date: No planned start date specified           Past Medical History  Past Medical History:   Diagnosis Date    Acne     started during teen yrs., Accutane 2008-2009 during college yrs Anemia     During teen years only, no anemia recently    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    [facility]    Gestational hypertension 01/30/2017    end of pregnancy had hypertension, was induced    Headache     migraine occasionally    Hypertension     during week 37 week, pt induced and had C/S due to hypertension    IIH (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/2015    followed by ophthalmologist    Kidney stone     Malignant neoplasm of right breast in female, 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/30/2019    Pyelonephritis 09/2016    Kidney stones    Urinary tract infection     Had 5-6 in Lifetime, esp high school and college age    Varicella     during childhood    Vitamin B 12 deficiency        Reproductive/GYN History  OB History   Gravida Para Term Preterm AB Living   2 2 2  0 0 2   SAB IAB Ectopic Molar Multiple Live Births   0 0 0 0 0 2      # Outcome Date GA Lbr Len/2nd Weight Sex Delivery Anes PTL Lv   2 Term 02/17/19 [redacted]w[redacted]d  3545 g (7 lb 13 oz) F CS-LTranv Spinal, EPI N LIV      Complications: Failure to Progress in First Stage      Name: Tami Gibson,Tami Gibson      Apgar1: 8  Apgar5: 9   1 Term 01/30/17 [redacted]w[redacted]d  2875 g (6 lb 5.4 oz) F CS-LTranv Spinal, EPI N LIV      Complications: Hypertension, Breech birth      Name: Wence,OAKLEY      Apgar1: 8  Apgar5: 9      Obstetric Comments   OB-History reviewed by Lucilla Lame RN on 02/22/2019.   Gardasil series completed   Last pap:  2017; NIL   No abn paps   No hx STI's       Surgical History  Past Surgical History:   Procedure Laterality Date    BREAST BIOPSY Right     IDC    CESAREAN SECTION  01/30/2017    breech    CHEMOTHERAPY      IR INSERT PORT AGE GREATER THAN 5 YRS  12/08/2019  IR INSERT PORT AGE GREATER THAN 5 YRS 12/08/2019 Jobe Gibbon, MD IMG VIR H&V [facility]    LUMBAR PUNCTURE DIAGNOSTIC [facility] HISTORICAL RESULT)  01/2015    to relieve ICP    PR BX/REMV,LYMPH NODE,DEEP AXILL Right 04/20/2020    Procedure: BX/EXC LYMPH NODE; OPEN, DEEP AXILRY NODE;  Surgeon: Moss Mc, MD;  Location: MAIN OR [facility];  Service: Surgical Oncology Breast    PR CESAREAN DELIVERY ONLY N/A 01/30/2017    Procedure: CESAREAN DELIVERY ONLY;  Surgeon: Asher Muir, MD;  Location: L&D C-SECTION OR SUITES [facility];  Service: Maternal-Fetal Medicine    PR CESAREAN DELIVERY ONLY N/A 02/17/2019    Procedure: CESAREAN DELIVERY ONLY;  Surgeon: Lonny Prude, MD;  Location: L&D C-SECTION OR SUITES [facility];  Service: Maternal-Fetal Medicine    PR IMPLNT BIO IMPLNT FOR SOFT TISSUE REINFORCEMENT Bilateral 04/20/2020    Procedure: IMPLANTATION BIOLOGIC IMPLANT(EG, ACELLULAR DERMAL MATRIX) FOR SOFT TISSUE REINFORCEMENT(EG, BREAST, TRUNK);  Surgeon: Arsenio Katz, MD;  Location: MAIN OR [facility];  Service: Plastics    PR INTRAOPERATIVE SENTINEL LYMPH NODE ID W DYE INJECTION Right 04/20/2020    Procedure: INTRAOPERATIVE IDENTIFICATION SENTINEL LYMPH NODE(S) INCLUDE INJECTION NON-RADIOACTIVE DYE, WHEN PERFORMED;  Surgeon: Moss Mc, MD;  Location: MAIN OR [place];  Service: Surgical Oncology Breast    PR INTRAOPERATIVE SENTINEL LYMPH NODE ID W DYE INJECTION Right 05/07/2020    Procedure: INTRAOPERATIVE IDENTIFICATION SENTINEL LYMPH NODE(S) INCLUDE INJECTION NON-RADIOACTIVE DYE, WHEN PERFORMED;  Surgeon: Moss Mc, MD;  Location: MAIN OR [place];  Service: Surgical Oncology Breast    PR MASTECTOMY, SIMPLE, COMPLETE Bilateral 04/20/2020    Procedure: MASTECTOMY, SIMPLE, COMPLETE;  Surgeon: Moss Mc, MD;  Location: MAIN OR [facility];  Service: Surgical Oncology Breast    PR REMOVE ARMPITS LYMPH NODES COMPLT Right 05/07/2020    Procedure: AXILLARY LYMPHADENECTOMY; COMPLETE;  Surgeon: Moss Mc, MD;  Location: MAIN OR [place];  Service: Surgical Oncology Breast    PR REPLACEMENT TISSUE EXPANDER W/PERMANENT IMPLANT Bilateral 08/13/2021    Procedure: REPLACEMENT OF TISSUE EXPANDER WITH PERMANENT IMPLANT;  Surgeon: Arsenio Katz, MD;  Location: MAIN OR [place];  Service: Plastics    PR TISSUE EXPANDER PLACEMENT BREAST RECONSTRUCTION Bilateral 04/20/2020    Procedure: TISSUE EXPANDER PLACEMENT IN BREAST RECONSTRUCTION, INCLUDING SUBSEQUENT EXPANSION(S);  Surgeon: Arsenio Katz, MD;  Location: MAIN OR ;  Service: Plastics    TONSILECTOMY, ADENOIDECTOMY, BILATERAL MYRINGOTOMY AND TUBES      WISDOM TOOTH EXTRACTION  2004       Medications    Current Outpatient Medications:     abemaciclib (VERZENIO) 100 mg Tab tablet, Take 1 tablet (100 mg total) by mouth Two (2) times a day. Take with or without food., Disp: 56 tablet, Rfl: 5    acetaminophen (TYLENOL) 325 MG tablet, Take 2 tablets (650 mg total) by mouth every six (6) hours as needed for pain., Disp: , Rfl:     calcium carbonate (TUMS) 200 mg calcium (500 mg) chewable tablet, Chew 1 tablet (200 mg of elem calcium total) daily., Disp: , Rfl:     cetirizine (ZYRTEC) 10 MG tablet, Take 1 tablet (10 mg total) by mouth daily., Disp: 30 tablet, Rfl: 2    cholecalciferol, vitamin D3-25 mcg, 1,000 unit,, 25 mcg (1,000 unit) capsule, Take 1 capsule (25 mcg total) by mouth., Disp: , Rfl:     cyanocobalamin 100 MCG tablet, Take 1 tablet (100 mcg total) by mouth daily., Disp: , Rfl:  docusate sodium (COLACE) 100 MG capsule, Take 1 capsule (100 mg total) by mouth nightly as needed (PRN)., Disp: , Rfl:     escitalopram oxalate (LEXAPRO) 20 MG tablet, Take 1 tablet (20 mg total) by mouth daily., Disp: 90 tablet, Rfl: 3    letrozole (FEMARA) 2.5 mg tablet, Take 1 tablet by mouth once daily, Disp: 90 tablet, Rfl: 0    letrozole (FEMARA) 2.5 mg tablet, Take 1 tablet (2.5 mg total) by mouth daily., Disp: 90 tablet, Rfl: 3    leuprolide (LUPRON) 3.75 mg injection, Inject 3.75 mg into the muscle., Disp: , Rfl:     omeprazole (PRILOSEC) 20 MG capsule, Take 1 capsule by mouth once daily, Disp: 90 capsule, Rfl: 0    omeprazole (PRILOSEC) 20 MG capsule, Take 1 capsule (20 mg total) by mouth daily., Disp: 90 capsule, Rfl: 0    ondansetron (ZOFRAN-ODT) 4 MG disintegrating tablet, DISSOLVE 1 TABLET IN MOUTH EVERY 8 HOURS AS NEEDED FOR NAUSEA, Disp: 18 tablet, Rfl: 0    ondansetron (ZOFRAN-ODT) 4 MG disintegrating tablet, Take 1 tablet (4 mg total) by mouth every eight (8) hours as needed for nausea., Disp: 30 tablet, Rfl: 3    rimegepant (NURTEC ODT) 75 mg TbDL, Take 1 tablet (75 mg total) by mouth., Disp: , Rfl:     semaglutide (OZEMPIC) 1 mg/dose (4 mg/3 mL) PnIj injection, Inject 1 mg under the skin every seven (7) days., Disp: 3 mL, Rfl: 3    SUMAtriptan (IMITREX) 100 MG tablet, TAKE ONE TABLET BY MOUTH AT ONSET OF MIGRAINE FOR UP TO 1 DOSE . IF SYMPTOMS PERSIST A SECOND DOSE MAY BE TAKEN IN 2 HOURS IF NEEDED, Disp: , Rfl:     topiramate (TOPAMAX) 25 MG tablet, TAKE 3 TABLETS BY MOUTH ONCE DAILY, Disp: , Rfl:     traZODone (DESYREL) 50 MG tablet, Take 0.5-1 tablets (25-50 mg total) by mouth nightly as needed for sleep. (for sleep), Disp: 30 tablet, Rfl: 5    vitamin E-134 mg, 200 UNIT, 134 mg (200 UNIT) capsule, Take 1 capsule (134 mg total) by mouth daily., Disp: , Rfl:   No current facility-administered medications for this visit.    Facility-Administered Medications Ordered in Other Visits:     leuprolide (LUPRON) 3.75 mg injection, , , ,     Allergies  No Known Allergies    Personal and Social History  Social History     Social History Narrative    PRECONCEPTUAL ASSESSMENT:        Chalene is a 31 y.o. Caucasian female    Caffeine:denies use    Cats: no    Exercise: not active    Diet: balanced    Family hx of of birth defects, chromosomal abnormality, intellectual disability, developmental delay: no.            PARTNER HISTORY:     Domingo Dimes is a 30 y.o.  Caucasian female.    Occupation:  Immunologist; [employer]    He has fathered children:  no    He is healthy with no chronic illness    Family history of  birth defects, chromosomal abnormality, intellectual disability, or developmental delay: no.                Family History  Family History   Problem Relation Age of Onset    Cancer Maternal Aunt 45        breast Ca    Hypertension Father     Other Father 72  Benign brain tumor    Hypothyroidism Mother     Cancer Paternal Aunt         Breast Ca dx in 41s    ALS Maternal Aunt     Ovarian cancer Maternal Grandmother 29    No Known Problems Maternal Grandfather     Cancer Paternal Grandmother         Unknown Cancer    No Known Problems Paternal Grandfather     No Known Problems Maternal Aunt     No Known Problems Maternal Aunt     Breast cancer Maternal Cousin         Dx in 74s    No Known Problems Paternal Aunt     No Known Problems Daughter     No Known Problems Daughter     No Known Problems Other     BRCA 1/2 Neg Hx     Colon cancer Neg Hx     Endometrial cancer Neg Hx          Review of Systems: A 12-system review of systems was obtained including: Constitutional, Eyes, ENT, Cardiovascular, Respiratory, GI, GU, Musculoskeletal, Skin, Neurological, Psychiatric, Endocrine, Heme/Lymphatic, and Allergic/Immunologic systems. It is negative or non-contributory to the patient???s management except for the following: See Interval History.    Physical Examination:  Vital Signs: There were no vitals taken for this visit.  General:  Healthy-appearing female in no acute distress..  Cardiovasc:  No heaves, regular, no additional sounds. No lower extremity edema.    Respiratory:  Chest clear to percussion and auscultation, unlabored  Gastrointestinal:  Soft, nontender, no hepatomegaly.   Musculoskeletal:  No bony pain or tenderness.   Skin and Subcutaneous Tissues: No significant rash.  Psychiatric: Mood is normal.  No other symptoms.   Neuro:  Alert and oriented.  Gait and coordination normal  Breasts: Bilateral mastectomies with implants in place. Well healed scars.  Heme/Lymphatic/Immunologic: No supraclavicular or cervical adenopathy    I have personally reviewed the following diagnostic studies:    Breast imaging and pathology reviewed. See Results for details

## 2021-12-13 NOTE — Unmapped (Signed)
Spoke with patient, address verified

## 2021-12-16 NOTE — Unmapped (Signed)
Pt demo is up to date

## 2021-12-18 NOTE — Unmapped (Signed)
[facility] Refill Coordination Note    Specialty Medication(s) to be Shipped:   Hematology/Oncology: Verzenio 100mg     Other medication(s) to be shipped: No additional medications requested for fill at this time     Tami Gibson, DOB: [birth year]  Phone: [phone] (home)       All above HIPAA information was verified with patient.     Was a Nurse, learning disability used for this call? No    Completed refill call assessment today to schedule patient's medication shipment from the [facility] [phone]).  All relevant notes have been reviewed.     Specialty medication(s) and dose(s) confirmed: Regimen is correct and unchanged.   Changes to medications: [name] reports no changes at this time.  Changes to insurance: No  New side effects reported not previously addressed with a pharmacist or physician: None reported  Questions for the pharmacist: No    Confirmed patient received a Conservation officer, historic buildings and a Surveyor, mining with first shipment. The patient will receive a drug information handout for each medication shipped and additional FDA Medication Guides as required.       DISEASE/MEDICATION-SPECIFIC INFORMATION        N/A    SPECIALTY MEDICATION ADHERENCE     Medication Adherence    Patient reported X missed doses in the last month: 0  Specialty Medication: VERZENIO 100 mg Tab tablet (abemaciclib)  Patient is on additional specialty medications: No  Informant: patient              Were doses missed due to medication being on hold? No    Verzenio 100 mg: 10 days of medicine on hand       REFERRAL TO PHARMACIST     Referral to the pharmacist: Not needed      [facility]     Shipping address confirmed in Epic.     Delivery Scheduled: Yes, Expected medication delivery date: 12/25/21.     Medication will be delivered via Next Day Courier to the prescription address in Epic WAM.    Tami Gibson   [facility] Specialty Technician

## 2021-12-24 MED FILL — VERZENIO 100 MG TABLET: ORAL | 28 days supply | Qty: 56 | Fill #2

## 2022-01-03 DIAGNOSIS — Z170 Estrogen receptor positive status [ER+]: Principal | ICD-10-CM

## 2022-01-03 DIAGNOSIS — C50911 Malignant neoplasm of unspecified site of right female breast: Principal | ICD-10-CM

## 2022-01-05 MED ORDER — MONTELUKAST 10 MG TABLET
ORAL_TABLET | 0 refills | 0 days
Start: 2022-01-05 — End: ?

## 2022-01-10 MED ORDER — WEGOVY 1 MG/0.5 ML SUBCUTANEOUS PEN INJECTOR
SUBCUTANEOUS | 3 refills | 0 days | Status: CP
Start: 2022-01-10 — End: ?

## 2022-01-13 ENCOUNTER — Ambulatory Visit: Admit: 2022-01-13 | Discharge: 2022-01-14 | Payer: PRIVATE HEALTH INSURANCE

## 2022-01-13 DIAGNOSIS — Z9889 Other specified postprocedural states: Principal | ICD-10-CM

## 2022-01-13 NOTE — Unmapped (Signed)
CC:     HPI:  Tami Gibson is a 31 y.o. 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 Teola Bradley) and immediate reconstruction with tissue expander placement on 04/20/20. Patients final pathology revealed residual disease with 2/3 positive lymph nodes and she underwent ALND with Dr. Teola Bradley on 05/07/20. She is on letrozole. She underwent adjuvant radiation (completed 08/06/20). She was originally interested in DIEP flap reconstruction but did not meet the BMI criteria. She underwent tissue expander to implant exchange (SCX 650 ml) with fat grafting to the bilateral breasts from the abdomen on 08/13/20. She took a 1 month course of Singulair to prevent capsular contracture.      Since her last visit, she has been doing well overall. She has some tethering of the right breast, but notes some more mobility on the right than previously. She has a revision procedure scheduled in November.  She is happy with how the left breast looks, but would like fat grafting to the left breast as well.       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    followed by ophthalmologist    Kidney stone     Malignant neoplasm of right breast in female, 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/30/2019    Pyelonephritis 09/2016    Kidney stones    Urinary tract infection     Had 5-6 in Lifetime, esp high school and college age    Varicella     during childhood    Vitamin B 12 deficiency        Past Surgical History:  Past Surgical History:   Procedure Laterality Date    BREAST BIOPSY Right     IDC    CESAREAN SECTION  01/30/2017    breech    CHEMOTHERAPY      IR INSERT PORT AGE GREATER THAN 5 YRS  12/08/2019    IR INSERT PORT AGE GREATER THAN 5 YRS 12/08/2019 Jobe Gibbon, MD IMG VIR H&[facility]    LUMBAR PUNCTURE DIAGNOSTIC [facility] HISTORICAL RESULT)  01/2015    to relieve ICP    PR BX/REMV,LYMPH NODE,DEEP AXILL Right 04/20/2020    Procedure: BX/EXC LYMPH NODE; OPEN, DEEP AXILRY NODE;  Surgeon: Moss Mc, MD;  Location: MAIN OR [facility];  Service: Surgical Oncology Breast    PR CESAREAN DELIVERY ONLY N/A 01/30/2017    Procedure: CESAREAN DELIVERY ONLY;  Surgeon: Asher Muir, MD;  Location: L&D C-SECTION OR SUITES [facility];  Service: Maternal-Fetal Medicine    PR CESAREAN DELIVERY ONLY N/A 02/17/2019    Procedure: CESAREAN DELIVERY ONLY;  Surgeon: Lonny Prude, MD;  Location: L&D C-SECTION OR SUITES [facility];  Service: Maternal-Fetal Medicine    PR IMPLNT BIO IMPLNT FOR SOFT TISSUE REINFORCEMENT Bilateral 04/20/2020    Procedure: IMPLANTATION BIOLOGIC IMPLANT(EG, ACELLULAR DERMAL MATRIX) FOR  SOFT TISSUE REINFORCEMENT(EG, BREAST, TRUNK);  Surgeon: Arsenio Katz, MD;  Location: MAIN OR [facility];  Service: Plastics    PR INTRAOPERATIVE SENTINEL LYMPH NODE ID W DYE INJECTION Right 04/20/2020    Procedure: INTRAOPERATIVE IDENTIFICATION SENTINEL LYMPH NODE(S) INCLUDE INJECTION NON-RADIOACTIVE DYE, WHEN PERFORMED;  Surgeon: Moss Mc, MD;  Location: MAIN OR [place];  Service: Surgical Oncology Breast    PR INTRAOPERATIVE SENTINEL LYMPH NODE ID W DYE INJECTION Right 05/07/2020    Procedure: INTRAOPERATIVE IDENTIFICATION SENTINEL LYMPH NODE(S) INCLUDE INJECTION NON-RADIOACTIVE DYE, WHEN PERFORMED;  Surgeon: Moss Mc, MD;  Location: MAIN OR [facility];  Service: Surgical Oncology Breast    PR MASTECTOMY, SIMPLE, COMPLETE Bilateral 04/20/2020    Procedure: MASTECTOMY, SIMPLE, COMPLETE;  Surgeon: Moss Mc, MD;  Location: MAIN OR [facility];  Service: Surgical Oncology Breast    PR REMOVE ARMPITS LYMPH NODES COMPLT Right 05/07/2020    Procedure: AXILLARY LYMPHADENECTOMY; COMPLETE;  Surgeon: Moss Mc, MD;  Location: MAIN OR [facility];  Service: Surgical Oncology Breast    PR REPLACEMENT TISSUE EXPANDER W/PERMANENT IMPLANT Bilateral 08/13/2021    Procedure: REPLACEMENT OF TISSUE EXPANDER WITH PERMANENT IMPLANT;  Surgeon: Arsenio Katz, MD;  Location: MAIN OR [place];  Service: Plastics    PR TISSUE EXPANDER PLACEMENT BREAST RECONSTRUCTION Bilateral 04/20/2020    Procedure: TISSUE EXPANDER PLACEMENT IN BREAST RECONSTRUCTION, INCLUDING SUBSEQUENT EXPANSION(S);  Surgeon: Arsenio Katz, MD;  Location: MAIN OR [place];  Service: Plastics    TONSILECTOMY, ADENOIDECTOMY, BILATERAL MYRINGOTOMY AND TUBES      WISDOM TOOTH EXTRACTION  2004       Medications:  Current Outpatient Medications   Medication Sig Dispense Refill    abemaciclib (VERZENIO) 100 mg Tab tablet Take 1 tablet (100 mg total) by mouth Two (2) times a day. Take with or without food. 56 tablet 5    acetaminophen (TYLENOL) 325 MG tablet Take 2 tablets (650 mg total) by mouth every six (6) hours as needed for pain.      calcium carbonate (TUMS) 200 mg calcium (500 mg) chewable tablet Chew 1 tablet (200 mg of elem calcium total) daily.      cetirizine (ZYRTEC) 10 MG tablet Take 1 tablet (10 mg total) by mouth daily. 30 tablet 2    cholecalciferol, vitamin D3-25 mcg, 1,000 unit,, 25 mcg (1,000 unit) capsule Take 1 capsule (25 mcg total) by mouth.      cyanocobalamin 100 MCG tablet Take 1 tablet (100 mcg total) by mouth daily.      docusate sodium (COLACE) 100 MG capsule Take 1 capsule (100 mg total) by mouth nightly as needed (PRN).      escitalopram oxalate (LEXAPRO) 20 MG tablet Take 1 tablet (20 mg total) by mouth daily. 90 tablet 3    hydroquinone 4 % cream APPLY CREAM TOPICALLY TWICE DAILY UP TO 3 MONTHS      letrozole (FEMARA) 2.5 mg tablet Take 1 tablet by mouth once daily 90 tablet 0    letrozole (FEMARA) 2.5 mg tablet Take 1 tablet (2.5 mg total) by mouth daily. 90 tablet 3    leuprolide (LUPRON) 3.75 mg injection Inject 3.75 mg into the muscle.      omeprazole (PRILOSEC) 20 MG capsule Take 1 capsule by mouth once daily 90 capsule 0    omeprazole (PRILOSEC) 20 MG capsule Take 1 capsule (20 mg total) by mouth daily. 90 capsule 0    ondansetron (ZOFRAN-ODT) 4 MG disintegrating tablet DISSOLVE 1 TABLET IN MOUTH EVERY 8 HOURS AS  NEEDED FOR NAUSEA 18 tablet 0    ondansetron (ZOFRAN-ODT) 4 MG disintegrating tablet Take 1 tablet (4 mg total) by mouth every eight (8) hours as needed for nausea. 30 tablet 3    rimegepant (NURTEC ODT) 75 mg TbDL Take 1 tablet (75 mg total) by mouth.      SUMAtriptan (IMITREX) 100 MG tablet TAKE ONE TABLET BY MOUTH AT ONSET OF MIGRAINE FOR UP TO 1 DOSE . IF SYMPTOMS PERSIST A SECOND DOSE MAY BE TAKEN IN 2 HOURS IF NEEDED      topiramate (TOPAMAX) 25 MG tablet TAKE 3 TABLETS BY MOUTH ONCE DAILY      traZODone (DESYREL) 50 MG tablet Take 0.5-1 tablets (25-50 mg total) by mouth nightly as needed for sleep. (for sleep) 30 tablet 5    vitamin E-134 mg, 200 UNIT, 134 mg (200 UNIT) capsule Take 1 capsule (134 mg total) by mouth daily.      WEGOVY 1 MG/0.5 ML SUBCUTANEOUS PEN INJECTOR Inject 1 mg under the skin every seven (7) days. 2 mL 3     No current facility-administered medications for this visit.     Facility-Administered Medications Ordered in Other Visits   Medication Dose Route Frequency Provider Last Rate Last Admin    leuprolide (LUPRON) 3.75 mg injection                Allergies:  No Known Allergies    Family History:   Family History   Problem Relation Age of Onset    Cancer Maternal Aunt 92        breast Ca    Hypertension Father     Other Father 53        Benign brain tumor    Hypothyroidism Mother     Cancer Paternal Aunt         Breast Ca dx in 44s    ALS Maternal Aunt Ovarian cancer Maternal Grandmother 69    No Known Problems Maternal Grandfather     Cancer Paternal Grandmother         Unknown Cancer    No Known Problems Paternal Grandfather     No Known Problems Maternal Aunt     No Known Problems Maternal Aunt     Breast cancer Maternal Cousin         Dx in 11s    No Known Problems Paternal Aunt     No Known Problems Daughter     No Known Problems Daughter     No Known Problems Other     BRCA 1/2 Neg Hx     Colon cancer Neg Hx     Endometrial cancer Neg Hx     The patient reports no family history for bleeding disorders or anesthetic problems.    Social History:  Social History     Socioeconomic History    Marital status: Married     Spouse name: Dylan    Number of children: 0   Occupational History    Occupation: Architect    Tobacco Use    Smoking status: Never    Smokeless tobacco: Never   Vaping Use    Vaping Use: Never used   Substance and Sexual Activity    Alcohol use: Not Currently    Drug use: No    Sexual activity: Yes     Partners: Male     Birth control/protection: Pill   Other Topics Concern    Exercise Yes    Living Situation No  Social History Narrative    PRECONCEPTUAL ASSESSMENT:        Carlee is a 31 y.o. Caucasian female    Caffeine:denies use    Cats: no    Exercise: not active    Diet: balanced    Family hx of of birth defects, chromosomal abnormality, intellectual disability, developmental delay: no.            PARTNER HISTORY:     Domingo Dimes is a 69 y.o.  Caucasian female.    Occupation:  Immunologist; [employer]    He has fathered children:  no    He is healthy with no chronic illness    Family history of  birth defects, chromosomal abnormality, intellectual disability, or developmental delay: no.              Social Determinants of Health     Financial Resource Strain: Low Risk  (04/21/2020)    Overall Financial Resource Strain (CARDIA)     Difficulty of Paying Living Expenses: Not very hard   Food Insecurity: No Food Insecurity (04/21/2020) Hunger Vital Sign     Worried About Running Out of Food in the Last Year: Never true     Ran Out of Food in the Last Year: Never true   Transportation Needs: No Transportation Needs (04/21/2020)    PRAPARE - Therapist, art (Medical): No     Lack of Transportation (Non-Medical): No       ROS:   Otherwise, 12 point review of systems was completed and is negative except as per HPI.      Vitals:   Vitals:    01/13/22 0817   BP: 112/73   Pulse: 92   Resp: 18   Temp: 36.2 ??C (97.1 ??F)   SpO2: 98%     ?  Physical Exam: Gen: AA in NAD  HEENT: NCAT, EOMI, PERRL, Non icteric sclerae, MMM  CV: RRR  RESP: quiet respirations on room air  EXT: warm and well perfused, compression garment to RUE  ABDOMEN: Soft, redundancy noted.  No obvious contour irregularity. Incisions well healed.   BREASTS: Some improved mobility on the right breast.  Softer.  Improved symmetry but still with some fullness superiorly on the right.  Right IMF location improved but still higher than the left.  Projection similar bilaterally. Some hollowing of the left lateral breast     ?  Impression and Plan:  Shrinidhi Beals is here today for a post op s/p tissue expander to permanent implant (650 SCX) with liposuction from the abdomen and fat grafting to bilateral breasts 08/13/21. Doing well.       Discussed revision procedure for the right with lowering the right breast and fat grafting to bilateral breasts, scar at the bottom of the right breast, possible implant exchange on the right        No follow-ups on file.

## 2022-01-13 NOTE — Unmapped (Signed)
APPOINTMENTS / QUESTIONS  For NON-urgent questions regarding appointments or your surgical care, you may reach out to your provider and/or their nursing staff via MyChart. Please keep in mind that it may take up to 2-3 business days for a response.    Additionally, you may contact our clinics, Monday through Friday, 8 am to 4:30 pm    [facility] [place]  [address],   [place] [address]  T: [phone]  F: [phone]    [facility]  [address], [place], [place] [address]  Gr[facility]  T: [phone]  F: [phone]    [facility] [place]  [address], [place], [place] [address]  T: [phone]  F: [phone]    [facility]  [address], [place], [place] [address]  Third Floor, [address]  T: [phone]   F: [phone]    [facility]  1st Floor, Ambulatory Care Center  [address]  [place], [place] [address]  T: [phone]  Website:  [facility]    Administrative Office  [address]  [place], [place] [address]  T:  [phone]  F:  [phone]      Physicians:  Rickelle Sylvestre Jeni Carr MD:   Nurse:  Chett Taniguchi BSN, RN [phone]    Surgery Scheduler: Cristina Etchart Martin  [phone]     Lynn Damitz MD:   Nurse:  Nicole Bailey BSN, RN, CPSN [phone]  Surgery Scheduler:   Cristina Etchart Martin  [phone]     Adeyemi Yemi Ogunleye MD:  Nurse: Jacquelyn Eron BSN, RN, BC  [phone]  Surgery Scheduler: Kina Williamson [phone]    Jeyhan Wood MD:  Nurse: Rachel Heller BSN, RN, CPSN  [phone]  Surgery Scheduler: Kina Williamson [phone]    Advance Practice Providers:  Renee E Edkins, DNP, NP-C:   Surgery Scheduler: Kina Williamson [phone]     Payton Leonhardt, PA-C:   Surgery Scheduler: Kina Williamson [phone]     Financial Navigator:  Darlene Smith  Hours:  7:30 am to 4 pm  P: [phone] - F: [phone]           Billing Questions/Financial Navigation:  [phone]  [facility] Customer Service Call Center:  [phone]    Surgery Scheduling:  You will receive a phone call from the surgery schedulers regarding date for surgery in 1-2 weeks from  your appointment.    Please call one of the above numbers if you have not received a phone call 2 weeks after your appointment with the provider.      [facility] Contact Information:   Please call to schedule  *Radiology (CT, X-ray, Ultrasound) [phone]  *Mammography & Breast Ultrasound [phone]  *MRI [phone]  *Interventional Radiology Procedure Scheduling [phone]    AFTER HOURS/HOLIDAYS:  For emergencies after-hours (after 5pm, or weekends): call the [facility] operator ([phone]) and ask to page the Plastic Surgery resident on call. You will be directed to a surgery resident who likely is not immediately aware of the details of your case, but can help you deal with any emergencies that cannot wait until regular business hours.  Please be aware that this person is responding to many in-hospital emergencies and patient issues and may not answer your phone call immediately, but will return your call as soon as possible.    Precare Location:  [facility] [place] ([facility])   [address]  [place], [place] [address] ([address] - old [place] [facility] [address] near [employer])    Blood Work Location:  [address] [place] [address]. There are no suite numbers, but it is located on the first floor there. Go to the main desk and tell them you are there for walk-in labs.  Precare:   The day prior to your scheduled surgery, [facility] will call you with instructions.  If you have not heard from them by 4PM, and would like to check on the status of your surgery, please call:  [facility]: [phone]  [facility]: [phone]  [facility]: [phone]  [place]:  [phone]    Insurance Denials:    All appeal initiation needs to be started by the patient.  We do not appeal insurance denials.     [facility] Hotline:  [phone]    [facility] forms and paperwork:  Please allow  a 2 week turn around for forms to be completed and faxed.

## 2022-01-15 ENCOUNTER — Institutional Professional Consult (permissible substitution): Admit: 2022-01-15 | Discharge: 2022-01-16 | Payer: PRIVATE HEALTH INSURANCE

## 2022-01-15 DIAGNOSIS — Z170 Estrogen receptor positive status [ER+]: Principal | ICD-10-CM

## 2022-01-15 DIAGNOSIS — C50911 Malignant neoplasm of unspecified site of right female breast: Principal | ICD-10-CM

## 2022-01-15 MED ADMIN — leuprolide (LUPRON) 3.75 mg injection: INTRAMUSCULAR | @ 14:00:00 | Stop: 2022-01-15

## 2022-01-15 MED ADMIN — leuprolide (LUPRON) injection 3.75 mg: 3.75 mg | INTRAMUSCULAR | @ 14:00:00 | Stop: 2022-01-15

## 2022-01-15 NOTE — Unmapped (Signed)
[facility] Clinical Assessment & Refill Coordination Note    Tami Gibson, [place]: Apr 16, 1991  Phone: [phone] (home)     All above HIPAA information was verified with patient.     Was a Nurse, learning disability used for this call? No    Specialty Medication(s):   Hematology/Oncology: Verzenio 100mg      Current Outpatient Medications   Medication Sig Dispense Refill   ??? abemaciclib (VERZENIO) 100 mg Tab tablet Take 1 tablet (100 mg total) by mouth Two (2) times a day. Take with or without food. 56 tablet 5   ??? acetaminophen (TYLENOL) 325 MG tablet Take 2 tablets (650 mg total) by mouth every six (6) hours as needed for pain.     ??? calcium carbonate (TUMS) 200 mg calcium (500 mg) chewable tablet Chew 1 tablet (200 mg of elem calcium total) daily.     ??? cetirizine (ZYRTEC) 10 MG tablet Take 1 tablet (10 mg total) by mouth daily. 30 tablet 2   ??? cholecalciferol, vitamin D3-25 mcg, 1,000 unit,, 25 mcg (1,000 unit) capsule Take 1 capsule (25 mcg total) by mouth.     ??? cyanocobalamin 100 MCG tablet Take 1 tablet (100 mcg total) by mouth daily.     ??? docusate sodium (COLACE) 100 MG capsule Take 1 capsule (100 mg total) by mouth nightly as needed (PRN).     ??? escitalopram oxalate (LEXAPRO) 20 MG tablet Take 1 tablet (20 mg total) by mouth daily. 90 tablet 3   ??? hydroquinone 4 % cream APPLY CREAM TOPICALLY TWICE DAILY UP TO 3 MONTHS     ??? letrozole (FEMARA) 2.5 mg tablet Take 1 tablet by mouth once daily 90 tablet 0   ??? letrozole (FEMARA) 2.5 mg tablet Take 1 tablet (2.5 mg total) by mouth daily. 90 tablet 3   ??? leuprolide (LUPRON) 3.75 mg injection Inject 3.75 mg into the muscle.     ??? omeprazole (PRILOSEC) 20 MG capsule Take 1 capsule by mouth once daily 90 capsule 0   ??? omeprazole (PRILOSEC) 20 MG capsule Take 1 capsule (20 mg total) by mouth daily. 90 capsule 0   ??? ondansetron (ZOFRAN-ODT) 4 MG disintegrating tablet DISSOLVE 1 TABLET IN MOUTH EVERY 8 HOURS AS NEEDED FOR NAUSEA 18 tablet 0   ??? ondansetron (ZOFRAN-ODT) 4 MG disintegrating tablet Take 1 tablet (4 mg total) by mouth every eight (8) hours as needed for nausea. 30 tablet 3   ??? rimegepant (NURTEC ODT) 75 mg TbDL Take 1 tablet (75 mg total) by mouth.     ??? SUMAtriptan (IMITREX) 100 MG tablet TAKE ONE TABLET BY MOUTH AT ONSET OF MIGRAINE FOR UP TO 1 DOSE . IF SYMPTOMS PERSIST A SECOND DOSE MAY BE TAKEN IN 2 HOURS IF NEEDED     ??? topiramate (TOPAMAX) 25 MG tablet TAKE 3 TABLETS BY MOUTH ONCE DAILY     ??? traZODone (DESYREL) 50 MG tablet Take 0.5-1 tablets (25-50 mg total) by mouth nightly as needed for sleep. (for sleep) 30 tablet 5   ??? vitamin E-134 mg, 200 UNIT, 134 mg (200 UNIT) capsule Take 1 capsule (134 mg total) by mouth daily.     ??? WEGOVY 1 MG/0.5 ML SUBCUTANEOUS PEN INJECTOR Inject 1 mg under the skin every seven (7) days. 2 mL 3     No current facility-administered medications for this visit.     Facility-Administered Medications Ordered in Other Visits   Medication Dose Route Frequency Provider Last Rate Last Admin   ???  leuprolide (LUPRON) 3.75 mg injection                 Changes to medications: Tami Gibson reports no changes at this time.    No Known Allergies    Changes to allergies: No    SPECIALTY MEDICATION ADHERENCE     Verzenio 100 mg: 10 days of medicine on hand     Medication Adherence    Patient reported X missed doses in the last month: 0  Specialty Medication: Verzenio 100mg                       Specialty medication(s) dose(s) confirmed: Regimen is correct and unchanged.     Are there any concerns with adherence? No    Adherence counseling provided? Not needed    CLINICAL MANAGEMENT AND INTERVENTION      Clinical Benefit Assessment:    Do you feel the medicine is effective or helping your condition? Yes    Clinical Benefit counseling provided? Not needed    Adverse Effects Assessment:    Are you experiencing any side effects? No    Are you experiencing difficulty administering your medicine? No    Quality of Life Assessment:    Quality of Life      Oncology  1. What impact has your specialty medication had on the reduction of your daily pain or discomfort level?: None  2. On a scale of 1-10, how would you rate your ability to manage side effects associated with your specialty medication? (1=no issues, 10 = unable to take medication due to side effects): 1            How many days over the past month did your condition/medication  keep you from your normal activities? For example, brushing your teeth or getting up in the morning. 0    Have you discussed this with your provider? Not needed    Acute Infection Status:    Acute infections noted within Epic:  No active infections  Patient reported infection: None    Therapy Appropriateness:    Is therapy appropriate and patient progressing towards therapeutic goals? Yes, therapy is appropriate and should be continued    DISEASE/MEDICATION-SPECIFIC INFORMATION      N/A    PATIENT SPECIFIC NEEDS     - Does the patient have any physical, cognitive, or cultural barriers? No    - Is the patient high risk? Yes, patient is taking oral chemotherapy. Appropriateness of therapy as been assessed    - Does the patient require a Care Management Plan? No     SOCIAL DETERMINANTS OF HEALTH     At the [facility], we have learned that life circumstances - like trouble affording food, housing, utilities, or transportation can affect the health of many of our patients.   That is why we wanted to ask: are you currently experiencing any life circumstances that are negatively impacting your health and/or quality of life? No    Social Determinants of Health     Financial Resource Strain: Low Risk  (04/21/2020)    Overall Financial Resource Strain (CARDIA)    ??? Difficulty of Paying Living Expenses: Not very hard   Internet Connectivity: Not on file   Food Insecurity: No Food Insecurity (04/21/2020)    Hunger Vital Sign    ??? Worried About Running Out of Food in the Last Year: Never true    ??? Ran Out of Food in the Last Year: Never true  Tobacco Use: Low Risk  (01/13/2022)    Patient History    ??? Smoking Tobacco Use: Never    ??? Smokeless Tobacco Use: Never    ??? Passive Exposure: Not on file   Housing/Utilities: Low Risk  (04/21/2020)    Housing/Utilities    ??? Within the past 12 months, have you ever stayed: outside, in a car, in a tent, in an overnight shelter, or temporarily in someone else's home (i.e. couch-surfing)?: No    ??? Are you worried about losing your housing?: No    ??? Within the past 12 months, have you been unable to get utilities (heat, electricity) when it was really needed?: No   Alcohol Use: Not At Risk (04/16/2020)    Alcohol Use    ??? How often do you have a drink containing alcohol?: 2 - 4 times per month    ??? How many drinks containing alcohol do you have on a typical day when you are drinking?: 1 - 2    ??? How often do you have 5 or more drinks on one occasion?: Never   Transportation Needs: No Transportation Needs (04/21/2020)    PRAPARE - Transportation    ??? Lack of Transportation (Medical): No    ??? Lack of Transportation (Non-Medical): No   Substance Use: Not on file   Health Literacy: Low Risk  (04/16/2020)    Health Literacy    ??? : Never   Physical Activity: Not on file   Interpersonal Safety: Not on file   Stress: Not on file   Intimate Partner Violence: Not on file   Depression: Not at risk (11/07/2021)    PHQ-2    ??? PHQ-2 Score: 0   Social Connections: Not on file       Would you be willing to receive help with any of the needs that you have identified today? No       SHIPPING     Specialty Medication(s) to be Shipped:   Hematology/Oncology: Verzenio 100mg     Other medication(s) to be shipped: No additional medications requested for fill at this time     Changes to insurance: No    Delivery Scheduled: Yes, Expected medication delivery date: 01/22/22.     Medication will be delivered via Next Day Courier to the confirmed prescription address in [facility].    The patient will receive a drug information handout for each medication shipped and additional FDA Medication Guides as required.  Verified that patient has previously received a Conservation officer, historic buildings and a Surveyor, mining.    The patient or caregiver noted above participated in the development of this care plan and knows that they can request review of or adjustments to the care plan at any time.      All of the patient's questions and concerns have been addressed.    Rollen Sox   [facility] Specialty Pharmacist

## 2022-01-15 NOTE — Unmapped (Signed)
1021 Administered Lupron and patient tolerated well. Applied band-aid .

## 2022-01-21 MED FILL — VERZENIO 100 MG TABLET: ORAL | 28 days supply | Qty: 56 | Fill #3

## 2022-01-22 ENCOUNTER — Telehealth: Admit: 2022-01-22 | Discharge: 2022-01-23 | Payer: PRIVATE HEALTH INSURANCE | Attending: Clinical | Primary: Clinical

## 2022-01-23 NOTE — Unmapped (Signed)
[facility]  Comprehensive Cancer Support Program   Telehealth Encounter    *non billable encounter*      Encounter Description: This encounter was conducted via telephone in the setting of State of Emergency due to COVID-19 Pandemic.     The patient reports they are currently: not at home. I spent 15 minutes on the phone with the patient on the date of service. I spent an additional 5 minutes on pre- and post-visit activities on the date of service.     The patient was physically located in [place] or a state in which I am permitted to provide care. The patient and/or parent/guardian understood that s/he may incur co-pays and cost sharing, and agreed to the telemedicine visit. The visit was reasonable and appropriate under the circumstances given the patient's presentation at the time.    The patient and/or parent/guardian has been advised of the potential risks and limitations of this mode of treatment (including, but not limited to, the absence of in-person examination) and has agreed to be treated using telemedicine. The patient's/patient's family's questions regarding telemedicine have been answered.     If the visit was completed in an ambulatory setting, the patient and/or parent/guardian has also been advised to contact their provider???s office for worsening conditions, and seek emergency medical treatment and/or call 911 if the patient deems either necessary.      Assessment:  Tami Gibson is a 31 y.o. female with a history of breast cancer. Referred by Luetta Nutting, PT for support navigating survivorship and long term impact of treatment (lymphedema). She describes a normal and healthy emotional response to treatment and is interested in engaging in therapy as she transitions from active treatment to survivorship.     Risk Assessment:  A suicide and violence risk assessment was performed as part of this evaluation. There is no acute risk for suicide or violence at this time.  While future psychiatric events cannot be accurately predicted, the patient does not currently require acute inpatient psychiatric care and does not currently meet [facility] involuntary commitment criteria.       Plan:  Will continue to follow for supportive counseling      Subjective:   Patient interviewed in a private place accompanied by no one. Shared updates since our last visit. Physically doing very well. 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 Time spent what processing this has been like and exploring ways of accessing support for her experience as needed. She notes having strong support from other young survivors. Generally feels that she has been doing well. Having a good summer with lots of lake time. Lymphedema has greatly improved since surgery. Provided supportive counseling, active listening, normalization, and psychoeducation.     Objective:    Mental Status Exam:  Speech/Language:    Normal rate, volume, tone, fluency   Mood:  euthymic   Thought process and Associations:   Logical, linear, clear, coherent, goal directed   Abnormal/psychotic thought content:     Denies SI, HI, self harm, delusions, obsessions, paranoid ideation, or ideas of reference   Perceptual disturbances:     Does not endorse auditory or visual hallucinations     Orientation:   Oriented to person, place, time, and general circumstances   Insight:     Intact   Judgment:    Intact   Impulse Control:   Intact  [name], LCSW  Comprehensive Cancer Support Program  Phone: [phone]  Pager: [phone]

## 2022-01-24 DIAGNOSIS — F419 Anxiety disorder, unspecified: Principal | ICD-10-CM

## 2022-01-24 MED ORDER — TRAZODONE 50 MG TABLET
ORAL_TABLET | 0 refills | 0 days | Status: CP
Start: 2022-01-24 — End: ?

## 2022-01-24 NOTE — Unmapped (Signed)
Patient is requesting the following refill  Requested Prescriptions     Pending Prescriptions Disp Refills    traZODone (DESYREL) 50 MG tablet [Pharmacy Med Name: traZODone HCl 50 MG Oral Tablet] 30 tablet 0     Sig: TAKE 1/2 TO 1 (ONE-HALF TO ONE) TABLET BY MOUTH NIGHTLY AS NEEDED FOR SLEEP       Order pended. Please advise. Thanks    Last OV: 11/07/2021   Next OV: 01/30/2022

## 2022-01-27 MED ORDER — ONDANSETRON 4 MG DISINTEGRATING TABLET
ORAL_TABLET | 0 refills | 0 days
Start: 2022-01-27 — End: ?

## 2022-01-28 MED ORDER — ONDANSETRON 4 MG DISINTEGRATING TABLET
ORAL_TABLET | 0 refills | 0 days | Status: CP
Start: 2022-01-28 — End: ?

## 2022-01-28 NOTE — Unmapped (Signed)
Patient need refill if appropriate.     Most recent clinic visit: 12/11/2021  Next clinic visit:03/26/2022

## 2022-01-30 ENCOUNTER — Telehealth
Admit: 2022-01-30 | Discharge: 2022-01-31 | Payer: PRIVATE HEALTH INSURANCE | Attending: Internal Medicine | Primary: Internal Medicine

## 2022-01-30 DIAGNOSIS — E661 Drug-induced obesity: Principal | ICD-10-CM

## 2022-01-30 DIAGNOSIS — Z6833 Body mass index (BMI) 33.0-33.9, adult: Principal | ICD-10-CM

## 2022-01-30 DIAGNOSIS — B084 Enteroviral vesicular stomatitis with exanthem: Principal | ICD-10-CM

## 2022-01-30 NOTE — Unmapped (Signed)
TeleHealth Video Encounter  This medical encounter was conducted virtually using [email or web address]  TeleHealth protocols.    Patient ID: Tami Gibson is a 31 y.o. female who presents by video interaction for evaluation of weight management    Present on Video Call: patient at home    Assessment/Plan:      1. Class 2 drug-induced obesity with serious comorbidity and body mass index (BMI) of 36.0 to 36.9 in adult  Has lost 25 lbs at least since initiation of semaglutide. This is a good pace and she has made improvements to diet, exercising regularly. Will increase to 1.7mg  dose once she runs out of current 1mg  supply. Follow up in 3 months.     2. Hand, foot and mouth disease  Recovering. Reviewed supportive care and preventive measures.      Preventive services addressed today  We did not review preventive services today    No follow-ups on file.       Subjective:     HPI  -Wanted to follow up on weight loss. Has been doing well on semaglutide. Recently switched from Ozempic brand to [facility] brand due to insurance no longer covering the former. New injector is different but she may just need time to get used to it. No nausea or vomiting. Does feel like weight loss was slowing down a little bit in the last month of Ozempic 1mg . Would like to increase dose if possible.  -Did have hand foot mouth recently, contracted from her young daughter. She had a lot of mouth sores, hand blisters, feet blisters. Lost about 4 lbs during illness alone due to poor appetite. Now recovering. Wondering if she's at risk for it again in the future.       Objective:     As part of this Video Visit, no in-person exam was conducted.  Video interaction permitted the following observations.    General: well appearing  RESP: Relaxed respiratory effort.   NEURO: Normal coordination.  No tremors observed.  PSYCH: Alert and oriented.  Speech fluent and sensible.  Calm affect.           The patient reports they are currently: at home. I spent 15 minutes on the real-time audio and video with the patient on the date of service. I spent an additional 2 minutes on pre- and post-visit activities on the date of service.     The patient was physically located in [place] or a state in which I am permitted to provide care. The patient and/or parent/guardian understood that s/he may incur co-pays and cost sharing, and agreed to the telemedicine visit. The visit was reasonable and appropriate under the circumstances given the patient's presentation at the time.    The patient and/or parent/guardian has been advised of the potential risks and limitations of this mode of treatment (including, but not limited to, the absence of in-person examination) and has agreed to be treated using telemedicine. The patient's/patient's family's questions regarding telemedicine have been answered.     If the visit was completed in an ambulatory setting, the patient and/or parent/guardian has also been advised to contact their provider???s office for worsening conditions, and seek emergency medical treatment and/or call 911 if the patient deems either necessary.

## 2022-02-07 DIAGNOSIS — U071 COVID: Principal | ICD-10-CM

## 2022-02-07 MED ORDER — MOLNUPIRAVIR 200 MG CAPSULE (EUA)
ORAL_CAPSULE | Freq: Two times a day (BID) | ORAL | 0 refills | 5 days | Status: CP
Start: 2022-02-07 — End: 2022-02-12

## 2022-02-10 NOTE — Unmapped (Signed)
Spoke with pt to inform her that per the current outpt COVID policy, 10 days is required after positive test before coming into clinic for appts. Let pt know that I reached out to Rad/Onc and Infusion Scheduling teams to get her appts rescheduled for a time on 9/17 or after. Pt did report that she is feeling much better now. Pt was understanding and appreciative of the call.

## 2022-02-12 NOTE — Unmapped (Signed)
[facility] Refill Coordination Note    Specialty Medication(s) to be Shipped:   Hematology/Oncology: Verzenio 100mg     Other medication(s) to be shipped: No additional medications requested for fill at this time     Tami Gibson, DOB: 04-09-1991  Phone: [phone] (home)       All above HIPAA information was verified with patient.     Was a Nurse, learning disability used for this call? No    Completed refill call assessment today to schedule patient's medication shipment from the [facility] [phone]).  All relevant notes have been reviewed.     Specialty medication(s) and dose(s) confirmed: Regimen is correct and unchanged.   Changes to medications: Dewey reports no changes at this time.  Changes to insurance: No  New side effects reported not previously addressed with a pharmacist or physician: None reported  Questions for the pharmacist: No    Confirmed patient received a Conservation officer, historic buildings and a Surveyor, mining with first shipment. The patient will receive a drug information handout for each medication shipped and additional FDA Medication Guides as required.       DISEASE/MEDICATION-SPECIFIC INFORMATION        N/A    SPECIALTY MEDICATION ADHERENCE     Medication Adherence    Patient reported X missed doses in the last month: 0  Specialty Medication: VERZENIO 100 mg Tab tablet (abemaciclib)  Patient is on additional specialty medications: No  Informant: patient                          Were doses missed due to medication being on hold? No    Verzenio 100 mg: 10 days of medicine on hand       REFERRAL TO PHARMACIST     Referral to the pharmacist: Not needed      [facility]     Shipping address confirmed in Epic.     Delivery Scheduled: Yes, Expected medication delivery date: 02/19/22.     Medication will be delivered via Next Day Courier to the prescription address in Epic [facility].    [name]   [facility] Specialty Technician

## 2022-02-13 DIAGNOSIS — E661 Drug-induced obesity: Principal | ICD-10-CM

## 2022-02-13 DIAGNOSIS — Z6836 Body mass index (BMI) 36.0-36.9, adult: Principal | ICD-10-CM

## 2022-02-13 MED ORDER — WEGOVY 1.7 MG/0.75 ML SUBCUTANEOUS PEN INJECTOR
SUBCUTANEOUS | 3 refills | 28 days | Status: CP
Start: 2022-02-13 — End: ?

## 2022-02-13 MED ORDER — WEGOVY 1 MG/0.5 ML SUBCUTANEOUS PEN INJECTOR
SUBCUTANEOUS | 3 refills | 0 days | Status: CN
Start: 2022-02-13 — End: ?

## 2022-02-13 NOTE — Unmapped (Signed)
Patient is requesting the following refill  Requested Prescriptions     Pending Prescriptions Disp Refills    WEGOVY 1 MG/0.5 ML SUBCUTANEOUS PEN INJECTOR 2 mL 3     Sig: Inject 1 mg under the skin every seven (7) days.       Order pended. Please advise. Thanks    Last OV: 11/07/2021   Next OV: Visit date not found

## 2022-02-17 ENCOUNTER — Institutional Professional Consult (permissible substitution): Admit: 2022-02-17 | Discharge: 2022-02-18 | Payer: PRIVATE HEALTH INSURANCE

## 2022-02-17 DIAGNOSIS — C50911 Malignant neoplasm of unspecified site of right female breast: Principal | ICD-10-CM

## 2022-02-17 DIAGNOSIS — Z170 Estrogen receptor positive status [ER+]: Principal | ICD-10-CM

## 2022-02-17 MED ADMIN — leuprolide (LUPRON) injection 3.75 mg: 3.75 mg | INTRAMUSCULAR | @ 13:00:00 | Stop: 2022-02-17

## 2022-02-17 MED ADMIN — leuprolide (LUPRON) 3.75 mg injection: INTRAMUSCULAR | @ 13:00:00 | Stop: 2022-02-17

## 2022-02-17 NOTE — Unmapped (Signed)
4540 Administered Lupron and patient tolerated well. Applied band-aid .

## 2022-02-17 NOTE — Unmapped (Signed)
Radiation Oncology Follow Up Visit Note    Patient Name: Tami Gibson  Patient Age: 31 y.o.  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 She completed PMRT in 07/2020.     Interval Since Completion of Treatment:  1 year 6 months (08/06/2020)    Disease Status: No evidence of disease: 02/21/22    Toxicity/Adverse Effects: None     Care Plan: Continue letrozole, lupron     Plan:  Scheduled to follow-up with Dr. Laney Pastor (medical oncology): 03/26/2022  Scheduled to follow-up with Dr. Lafayette Dragon (surgical oncology): 03/13/2022  Scheduled to follow-up with surg onc: 06/2022  We will schedule follow-up with Dr. Baird Lyons (radiation oncology) in July 2024  Revision of reconstruction surgery scheduled for 04/16/2022      Interval History:  Tami Gibson returns today for a regularly scheduled follow-up. She was last seen in clinic  08/01/2021. Since her last visit, she has been doing fairly well. She has some tightness of the right breast and is planned for revision surgery in November. She has continued to recover well from her therapy and she is reporting minimal toxicity.Tolerating letrozole, lupron.       Past Medical, Surgical, Family and Social Histories reviewed and updated in the electronic medical record.  Oncology History Overview Note   31 year old female with recently self palpated right breast lump. A targeted right breast ultrasound on 11/15/2019 showed an irregular mass with spiculated margins at the 7:00 position, 6 cm from the nipple, measuring 1.4 x 1.2 x 1.0 cm. There were 2 abnormal level 1 axillary lymph nodes measuring 1.4 x 1.3 cm and 1.2 x 0.9 cm. Additionally, there was a high level 1/level 2 axillary lymph node, measuring 2.1 x 1.3 cm with an effaced fatty hilum.      Malignant neoplasm of right breast in female, estrogen receptor positive (CMS-HCC)   11/09/2019 Initial Diagnosis    Malignant neoplasm of right breast in female, estrogen receptor positive (CMS-HCC)     11/15/2019 Biopsy    Invasive ductal carcinoma  - Nottingham combined histologic grade: 1  ER 91-100%, PR 91-100%, HER2 negative by IHC    Lymph node, right axilla, core biopsy  - Lymph node positive for metastatic carcinoma,      12/02/2019 -  Cancer Staged    Staging form: Breast, AJCC 8th Edition  - Clinical: Stage IB (cT1c, cN1, cM0, G1, ER+, PR+, HER2-) - Signed by [name], MD on 12/02/2019       12/09/2019 - 03/22/2020 Chemotherapy    OP BREAST AC (Dose Dense) q2W X 4 CYCLES, THEN PACLITAXEL (DOSE DENSE) Q2W x 4 CYCLES  DOXOrubicin 60 mg/m2 IV on day 1, cyclophosphamide 600 mg/m2 IV on day 1, every 2 weeks for 4 cycles, then PACLitaxel 175 mg/m2 every 2 weeks for 4 cycles      04/20/2020 Surgery    Breast, right, simple mastectomy  -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 Extracapsular extension is identified      Breast, left, simple mastectomy  -Benign breast tissue with no atypia, in  situ or invasive carcinoma identified     04/20/2020 -  Cancer Staged    Staging form: Breast, AJCC 8th Edition  - Pathologic stage from 04/20/2020: No Stage Recommended (ypT1c, pN1a, cM0, G2, ER+, PR+, HER2-) - Signed by [name], MD on 04/25/2020       05/07/2020 Surgery    Lymph nodes, right axillary, dissection  -One of sixteen lymph nodes involved by metastatic adenocarcinoma (1/16)  -Metastatic focus measures 3 mm in greatest dimension  -No extracapsular extension is identified     06/28/2020 - 08/06/2020 Radiation    The total radiation dose will be 5000 cGy at 200 cGy/fraction for a total of 25 fractions, treated once a day     08/13/2020 Endocrine/Hormone Therapy    Letrozole 2.5mg  +Ovarian suppression (monthly lupron)     Malignant neoplasm of right breast in female, estrogen receptor positive (CMS-HCC)   05/07/2020 Surgery    Lymph nodes, right axillary, dissection  -One of sixteen lymph nodes involved by metastatic adenocarcinoma (1/16)  -Metastatic focus measures 3 mm in greatest dimension  -No extracapsular extension is identified     06/13/2020 -  Radiation    Radiation Therapy Treatment Details (Noted on 06/13/2020)  Site: Right Breast - Overlapping sites  Technique: 3D CRT  Goal: No goal specified  Planned Treatment Start Date: No planned start date specified     06/21/2020 -  Radiation    Radiation Therapy Treatment Details (Noted on 06/21/2020)  Site: Right Breast - Overlapping sites  Technique: 3D CRT  Goal: No goal specified  Planned Treatment Start Date: No planned start date specified           Physical Exam:  There were no vitals filed for this visit.  Karnofsky Performance Status: 100, Fully active, able to carry on all pre-disease performed without restriction (ECOG equivalent 0)  General:  No acute distress, alert and oriented X 4   Neuro:  Normal Gait and Cognition. CN II-XII focally intact  HEENT: Moist mucous membranes  Neck: Supple, midline  Cardio: Normal rate.  Lungs: Normal work of breathing  MSK: No joint pains or effusions  Psych: Normal mood and affect.  Converses clearly and emotionally appropriate.   Breast:  Examination in the sitting and recumbent positions finds excellent symmetry.  The chest wall is without abnormalities or nodularity.  There are no abnormalities, skin changes or masses bilaterally.      Labs:    No results found for: CA125, CEA, AFPTM, CA199, HCGTM, HE4, PSADIAG    No results found for: WBC, HGB, HCT, PLT, LDH, CREATININE, AST, ALT, MG      The above case was reviewed, patient was seen and examined, and the plan of care was discussed with Dr. Perfecto Kingdom, MD  Radiation Oncology PGY-4  [place] [facility]  02/21/2022       -I saw and evaluated/examined the patient, participating in the key portions of the service.  I discussed the findings, assessment, and plan of care with the resident/APP. I personally reviewed all relevant data associated with this encounter including imaging, labwork, and prior records. I have edited and agree with the findings and plan as documented in the resident/APP's note.  Dana L. Baird Lyons, MD  Assistant Professor  Department of Radiation Oncology

## 2022-02-18 MED FILL — VERZENIO 100 MG TABLET: ORAL | 28 days supply | Qty: 56 | Fill #4

## 2022-02-21 ENCOUNTER — Ambulatory Visit: Admit: 2022-02-21 | Payer: PRIVATE HEALTH INSURANCE | Attending: Radiation Oncology | Primary: Radiation Oncology

## 2022-02-21 ENCOUNTER — Ambulatory Visit: Admit: 2022-02-21 | Discharge: 2022-02-22 | Payer: PRIVATE HEALTH INSURANCE

## 2022-02-21 ENCOUNTER — Ambulatory Visit
Admit: 2022-02-21 | Discharge: 2022-02-22 | Payer: PRIVATE HEALTH INSURANCE | Attending: Radiation Oncology | Primary: Radiation Oncology

## 2022-02-21 MED ADMIN — ketorolac (TORADOL) injection 60 mg: 60 mg | INTRAMUSCULAR | @ 23:00:00 | Stop: 2022-02-21

## 2022-02-21 NOTE — Unmapped (Signed)
SiteLast TxDose  Rx:Rt Sclav: 08/06/2020: 5,000/5,000 cGy  Rx:Rt Chestwa: 08/06/2020: 5,000/5,000 cGy    Wt Readings from Last 6 Encounters:   02/21/22 93.4 kg (206 lb)   01/13/22 95.4 kg (210 lb 4.8 oz)   12/11/21 98.6 kg (217 lb 6.4 oz)   11/29/21 97.5 kg (215 lb)   11/07/21 99.9 kg (220 lb 3.2 oz)   10/10/21 99.8 kg (220 lb 1.6 oz)    02/21/2022      Subjective/Assessment/Recommendations:    1. Fatigue: No issues  2. Pain: Mild  3. Skin: No problems  4. Lymphedema: Not present  5. Prescription Needs: None  6. Psychosocial: Has home support  7. Surveillance: N/A  8: Hormones: Yes, Hot flashes, and Musculoskeletal pain

## 2022-02-21 NOTE — Unmapped (Signed)
NOT a phone call, but employee reported to [facility] at 15:58 c/o ankle injury. No timely appointments available as clinic was closing.     SAFE report form number (per employee report) = [phone]  Unfortunately - [facility] staff is unable to visualize this SAFE event form and suspect employee entered the event under a different category other than Employee Event    Employee was referred to [facility] Urgent care.  Please seen [facility] Triage form scanned into [employer]

## 2022-02-22 NOTE — Unmapped (Signed)
[facility] CLINIC NOTE    ASSESSMENT/PLAN:    You were given a Toradol injection today in clinic. You should not take any Ibuprofen, Advil, Motrin or Aleve for the next 6 hours. You may take Tylenol at hour 4.      Once the 6 hours are up you may use the following:      You may use ibuprofen 600 mg every 6 hours as needed. An alternative is tylenol 650 mg 1000 mg 3 x per day as needed.   Some people are now taking ibuprofen 600 mg and Tylenol 650 mg simultaneously for pain.  Whenever you take ibuprofen you take it with food.     Follow up with your PCP if not improving     Go to the Emergency Department if you worsen     Ice  for 20 minutes at time for 3-4 days, 3-4 x per day place a thin cloth between the ice and your skin.     Elevate knee, hand and ankle when at rest     Your x rays were normal     Wear the boot and the knee brace during the day time.     A referral has been generated for Orthopedics.  They will call you for an appointment.       Tami Gibson is in agreement with the plan of care.    Problem List Items Addressed This Visit    None  Visit Diagnoses       Moderate right ankle sprain, initial encounter    -  Primary    Acute right ankle pain        Relevant Medications    ketorolac (TORADOL) injection 60 mg (Completed)    Other Relevant Orders    XR Ankle 3 or More Views Right (Completed)    Ambulatory referral to Orthopedic Surgery    Acute pain of left knee        Relevant Medications    ketorolac (TORADOL) injection 60 mg (Completed)    Other Relevant Orders    XR Knee 1 or 2 Views Left (Completed)    Ambulatory referral to Orthopedic Surgery    Pain of left hand        Relevant Medications    ketorolac (TORADOL) injection 60 mg (Completed)    Other Relevant Orders    XR Hand 3 Or More Views Left (Completed)    Ambulatory referral to Orthopedic Surgery            Chief Complaint   Patient presents with    Ankle Injury     -R    Hand Injury     L    Knee Injury -L        SUBJECTIVE:    Tami Gibson is a 31 y.o. female that presents to Urgent Care  today regarding the following issues:    # Falling injury    Seen in conjunction with husband.     Right ankle, left hand and knee injury. The fall happened today. Patient has been icing and elevating. No LOC. Patient hasn't taken any medication.    I have reviewed the patients problem list, medical history, surgical history, laboratory history and recent hospitalizations, current medications, allergies, and social history and updated them as needed.    Review of symptoms:  Negative unless  Otherwise stated in HPI.     Tami Gibson  reports that she has never  smoked. She has never used smokeless tobacco.    OBJECTIVE:    VITALS:   Vitals:    02/21/22 1840   BP: 120/85   Pulse: 90   Temp: 36.5 ??C (97.7 ??F)    Wt:   Wt Readings from Last 3 Encounters:   02/21/22 95.3 kg (210 lb)   02/21/22 93.4 kg (206 lb)   01/13/22 95.4 kg (210 lb 4.8 oz)       Physical Exam       Gen: Pleasant and cooperative in NAD, resting comfortably, appears stated age  Head: Normocephalic, atraumatic  Msk: Right ankle swelling and tenderness, left palm tenderness, left knee tenderness   Neuro:  A&O x 4, normal gait  Skin: Warm and dry, no obvious rash or suspicious lesions present  Psych:  Mood is good, able to carry on normal conversation, with good eye contact, without evidence of anxiety or depression    LABS:  No results found for any visits on 02/21/22.    STUDIES:  XR Ankle 3 or More Views Right    Result Date: 02/21/2022  EXAM: XR ANKLE 3 OR MORE VIEWS RIGHT DATE: 02/21/2022 6:33 PM ACCESSION: [id number] UN DICTATED: 02/21/2022 6:43 PM INTERPRETATION LOCATION: Main Campus     CLINICAL INDICATION: 31 years old Female with ankle pain  - M25.571 - Acute right ankle pain      COMPARISON: None.     TECHNIQUE: AP, oblique and lateral views of the right ankle.     FINDINGS: No acute fracture. Alignment is normal. Ankle mortise is preserved. Mild soft tissue swelling overlying the lateral malleolus.         No acute fracture or dislocation.             XR Hand 3 Or More Views Left    Result Date: 02/21/2022  EXAM: XR HAND 3 OR MORE VIEWS LEFT DATE: 02/21/2022 6:27 PM ACCESSION: [id number] UN DICTATED: 02/21/2022 6:41 PM INTERPRETATION LOCATION: MAIN CAMPUS     CLINICAL INDICATION: Female, 31 years old with palm pain  - M79.642 - Pain of left hand     COMPARISON: None.     TECHNIQUE: PA, oblique, lateral views of the left hand     FINDINGS: No fracture is identified. Joint alignment is normal.         No acute fracture or dislocation.         XR Knee 1 or 2 Views Left    Result Date: 02/21/2022  EXAM: XR KNEE 1 OR 2 VIEWS LEFT DATE: 02/21/2022 6:33 PM ACCESSION: [id number] UN DICTATED: 02/21/2022 6:40 PM INTERPRETATION LOCATION: [facility]     CLINICAL INDICATION: 31 years old Female with knee pani  - M25.562 - Acute pain of left knee      COMPARISON: None     TECHNIQUE: AP and lateral radiographs of the left knee.     FINDINGS: No acute fracture or dislocation. Joint spaces are maintained. No sizable suprapatellar effusion.         No acute fracture or dislocation.          ___________________________________  CURRENT MEDS:  Current Outpatient Medications   Medication Sig Dispense Refill    abemaciclib (VERZENIO) 100 mg Tab tablet Take 1 tablet (100 mg total) by mouth Two (2) times a day. Take with or without food. 56 tablet 5    acetaminophen (TYLENOL) 325 MG tablet Take 2 tablets (650 mg total) by mouth every six (6) hours as needed for pain.  calcium carbonate (TUMS) 200 mg calcium (500 mg) chewable tablet Chew 1 tablet (200 mg of elem calcium total) daily.      cetirizine (ZYRTEC) 10 MG tablet Take 1 tablet (10 mg total) by mouth daily. 30 tablet 2    cholecalciferol, vitamin D3-25 mcg, 1,000 unit,, 25 mcg (1,000 unit) capsule Take 1 capsule (25 mcg total) by mouth.      cyanocobalamin 100 MCG tablet Take 1 tablet (100 mcg total) by mouth daily. docusate sodium (COLACE) 100 MG capsule Take 1 capsule (100 mg total) by mouth nightly as needed (PRN).      escitalopram oxalate (LEXAPRO) 20 MG tablet Take 1 tablet (20 mg total) by mouth daily. 90 tablet 3    hydroquinone 4 % cream APPLY CREAM TOPICALLY TWICE DAILY UP TO 3 MONTHS      letrozole (FEMARA) 2.5 mg tablet Take 1 tablet (2.5 mg total) by mouth daily. 90 tablet 3    leuprolide (LUPRON) 3.75 mg injection Inject 3.75 mg into the muscle.      omeprazole (PRILOSEC) 20 MG capsule Take 1 capsule by mouth once daily 90 capsule 0    omeprazole (PRILOSEC) 20 MG capsule Take 1 capsule (20 mg total) by mouth daily. 90 capsule 0    ondansetron (ZOFRAN-ODT) 4 MG disintegrating tablet DISSOLVE 1 TABLET IN MOUTH EVERY 8 HOURS AS NEEDED FOR NAUSEA 18 tablet 0    rimegepant (NURTEC ODT) 75 mg TbDL Take 1 tablet (75 mg total) by mouth.      SUMAtriptan (IMITREX) 100 MG tablet TAKE ONE TABLET BY MOUTH AT ONSET OF MIGRAINE FOR UP TO 1 DOSE . IF SYMPTOMS PERSIST A SECOND DOSE MAY BE TAKEN IN 2 HOURS IF NEEDED      topiramate (TOPAMAX) 25 MG tablet TAKE 3 TABLETS BY MOUTH ONCE DAILY      traZODone (DESYREL) 50 MG tablet TAKE 1/2 TO 1 (ONE-HALF TO ONE) TABLET BY MOUTH NIGHTLY AS NEEDED FOR SLEEP 30 tablet 0    vitamin E-134 mg, 200 UNIT, 134 mg (200 UNIT) capsule Take 1 capsule (134 mg total) by mouth daily.      WEGOVY 1.7 MG/0.75 ML SUBCUTANEOUS PEN INJECTOR Inject 0.75 mL (1.7 mg total) under the skin every seven (7) days. 3 mL 3     No current facility-administered medications for this visit.     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    [facility]    Gestational hypertension 01/30/2017    end of pregnancy had hypertension, was induced    Headache     migraine occasionally    Hypertension     during week 37 week, pt induced and had C/S due to hypertension    IIH (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/2015    followed by ophthalmologist    Kidney stone     Malignant neoplasm of right breast in female, 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/30/2019    Pyelonephritis 09/2016    Kidney  stones    Urinary tract infection     Had 5-6 in Lifetime, esp high school and college age    Varicella     during childhood    Vitamin B 12 deficiency          FMURGENTCARETRACKING       Did today's visit save an ED/Direct Admission?  yes    Referral made to in-network specialty Orthopedic    PHQ-2 Score:  PHQ-2 Total Score : 0  PHQ-9 Score:       Screening complete, no depression identified / no further action needed today          [facility]  [place] [facility]  [address] [place], [place] [address]  Telephone [phone]  Fax [phone]  [email or web address]    Scribe's Attestation: Willeen Cass, FNP obtained and performed the history, physical exam and medical decision making elements that were entered into the chart.  Signed by Welton Flakes, Scribe, on February 21, 2022 at 6:44 PM.    The documentation recorded by the scribe accurately reflects the service I personally performed and the decisions made by me. Willeen Cass, FNP  February 21, 2022 8:51 PM

## 2022-02-22 NOTE — Unmapped (Addendum)
It was a pleasure to care for you at [facility] today.     Please feel free to return as needed     You were given a Toradol injection today in clinic. You should not take any Ibuprofen, Advil, Motrin or Aleve for the next 6 hours. You may take Tylenol at hour 4.      Once the 6 hours are up you may use the following:      You may use ibuprofen 600 mg every 6 hours as needed. An alternative is tylenol 650 mg 1000 mg 3 x per day as needed.   Some people are now taking ibuprofen 600 mg and Tylenol 650 mg simultaneously for pain.  Whenever you take ibuprofen you take it with food.     Follow up with your PCP if not improving     Go to the Emergency Department if you worsen     Ice  for 20 minutes at time for 3-4 days, 3-4 x per day place a thin cloth between the ice and your skin.     Elevate knee, hand and ankle when at rest     Your x rays were normal     Wear the boot and the knee brace during the day time.     A referral has been generated for Orthopedics.  They will call you for an appointment.

## 2022-02-23 DIAGNOSIS — F419 Anxiety disorder, unspecified: Principal | ICD-10-CM

## 2022-02-23 MED ORDER — TRAZODONE 50 MG TABLET
ORAL_TABLET | 0 refills | 0 days
Start: 2022-02-23 — End: ?

## 2022-02-24 MED ORDER — TRAZODONE 50 MG TABLET
ORAL_TABLET | 0 refills | 0 days | Status: CP
Start: 2022-02-24 — End: ?

## 2022-02-24 NOTE — Unmapped (Signed)
Patient is requesting the following refill  Requested Prescriptions     Pending Prescriptions Disp Refills    traZODone (DESYREL) 50 MG tablet [Pharmacy Med Name: traZODone HCl 50 MG Oral Tablet] 30 tablet 0     Sig: TAKE 1/2 TO 1 (ONE-HALF TO ONE) TABLET BY MOUTH NIGHTLY AS NEEDED FOR SLEEP       Order pended. Please advise. Thanks    Last OV: 11/07/2021   Next OV: Visit date not found

## 2022-02-28 ENCOUNTER — Ambulatory Visit
Admit: 2022-02-28 | Payer: PRIVATE HEALTH INSURANCE | Attending: Rehabilitative and Restorative Service Providers" | Primary: Rehabilitative and Restorative Service Providers"

## 2022-02-28 ENCOUNTER — Ambulatory Visit
Admit: 2022-02-28 | Discharge: 2022-03-29 | Payer: PRIVATE HEALTH INSURANCE | Attending: Rehabilitative and Restorative Service Providers" | Primary: Rehabilitative and Restorative Service Providers"

## 2022-02-28 NOTE — Unmapped (Signed)
[facility] THERAPY SERVICES [place]  OUTPATIENT PHYSICAL THERAPY  02/28/2022  Note Type: Evaluation       Patient Name: Tami Gibson  Date of Birth:06-Sep-1990  Diagnosis:   Encounter Diagnoses   Name Primary?    Lymphedema syndrome, postmastectomy Yes    Malignant neoplasm of right breast in female, estrogen receptor positive, unspecified site of breast (CMS-HCC)     Scar condition and fibrosis of skin     Lymphedema      Referring MD:  [name], MD    Date of Onset of Impairment-No date available  Date PT Care Plan Established or Reviewed-No date available  Date PT Treatment Started-No date available   Plan of Care Effective Date:     Oncology History Overview Note   31 year old female with recently self palpated right breast lump. A targeted right breast ultrasound on 11/15/2019 showed an irregular mass with spiculated margins at the 7:00 position, 6 cm from the nipple, measuring 1.4 x 1.2 x 1.0 cm. There were 2 abnormal level 1 axillary lymph nodes measuring 1.4 x 1.3 cm and 1.2 x 0.9 cm. Additionally, there was a high level 1/level 2 axillary lymph node, measuring 2.1 x 1.3 cm with an effaced fatty hilum.      Malignant neoplasm of right breast in female, estrogen receptor positive (CMS-HCC)   11/09/2019 Initial Diagnosis    Malignant neoplasm of right breast in female, estrogen receptor positive (CMS-HCC)     11/15/2019 Biopsy    Invasive ductal carcinoma  - Nottingham combined histologic grade: 1  ER 91-100%, PR 91-100%, HER2 negative by IHC    Lymph node, right axilla, core biopsy  - Lymph node positive for metastatic carcinoma,      12/02/2019 -  Cancer Staged    Staging form: Breast, AJCC 8th Edition  - Clinical: Stage IB (cT1c, cN1, cM0, G1, ER+, PR+, HER2-) - Signed by [name], MD on 12/02/2019       12/09/2019 - 03/22/2020 Chemotherapy    OP BREAST AC (Dose Dense) q2W X 4 CYCLES, THEN PACLITAXEL (DOSE DENSE) Q2W x 4 CYCLES  DOXOrubicin 60 mg/m2 IV on day 1, cyclophosphamide 600 mg/m2 IV on day 1, every 2 weeks for 4 cycles, then PACLitaxel 175 mg/m2 every 2 weeks for 4 cycles      04/20/2020 Surgery    Breast, right, simple mastectomy  -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 Extracapsular extension is identified      Breast, left, simple mastectomy  -Benign breast tissue with no atypia, in situ or invasive carcinoma identified     04/20/2020 -  Cancer Staged    Staging form: Breast, AJCC 8th Edition  - Pathologic stage from 04/20/2020: No Stage Recommended (ypT1c, pN1a, cM0, G2, ER+, PR+, HER2-) - Signed by [name], MD on 04/25/2020       05/07/2020 Surgery    Lymph nodes, right axillary, dissection  -One of sixteen lymph nodes involved by metastatic adenocarcinoma (1/16)  -Metastatic focus measures 3 mm in greatest dimension  -No extracapsular extension is identified     06/28/2020 - 08/06/2020 Radiation    The total radiation dose will be 5000 cGy at 200 cGy/fraction for a total of 25 fractions, treated once a day     08/13/2020 Endocrine/Hormone Therapy    Letrozole 2.5mg  +Ovarian suppression (monthly  lupron)     Malignant neoplasm of right breast in female, estrogen receptor positive (CMS-HCC)   05/07/2020 Surgery    Lymph nodes, right axillary, dissection  -One of sixteen lymph nodes involved by metastatic adenocarcinoma (1/16)  -Metastatic focus measures 3 mm in greatest dimension  -No extracapsular extension is identified     06/13/2020 -  Radiation    Radiation Therapy Treatment Details (Noted on 06/13/2020)  Site: Right Breast - Overlapping sites  Technique: 3D CRT  Goal: No goal specified  Planned Treatment Start Date: No planned start date specified     06/21/2020 -  Radiation    Radiation Therapy Treatment Details (Noted on 06/21/2020)  Site: Right Breast - Overlapping sites  Technique: 3D CRT  Goal: No goal specified  Planned Treatment Start Date: No planned start date specified           Assessment/Plan:    Assessment  Assessment details:    31 y.o. female presents with Stage I lymphedema secondary to breast cancer treatment.  In addition to lymphedema , the patient is experiencing these side effects from cancer treatments: Myofascial/scar restrictions post op  Radiation: Fibrosis/myofascial restrictions  Currently the right limb is 17 % bigger than the left limb.   Patient requires skilled Physical Therapy services  for the following problem list and secondary functional limitations:    Problem List:   Lack of knowledge of lymphedema/edema self care  Uncontrolled swelling and/or lack of appropriate compression garment    Secondary Functional Limitations:  Decreased knowledge of self care  of lymphedema/edema puts patient at risk for increased swelling and infection  Uncontrolled swelling can increase chances of  infection, and limit functional ROM             Impairments: postural weakness, increased edema, impaired sensation, muscular restrictions, uncontrolled swelling, impaired ADLs, decreased skin integrity and decreased knowledge of self-care      Personal Factors/Comorbidities: 1-2    Specific Comorbidities: hx breast CA    Examination of Body Systems: activity/participation, musculoskeletal, integumentary and lymphatic    Clinical Presentation: stable    Clinical Decision Making: low    Prognosis: excellent prognosis    Positive Prognosis Rationale: motivated for treatment, caregiver/family support, chronicity of condition, severity of symptoms, response to trial tx and previous tx with benefit.    Barriers to therapy: none identified    Therapy Goals      Goals:      Short Term Goals to be addressed in 4 weeks:  1. Pt will be independent with home stretching, ROM HEP to allow progression towards long-term goals, potentially increased comfort, ROM.   2. Pt will participate in MLD, be educated in self MLD to promote improved fluid transport, potentially decreased R breast and/or UE edema.  3. Pt will be able to articulate skin care recommendations, as well as signs/symptoms of infection, to promote long-term skin health, infection prevention, long-term maintenance of condition.    Long-Term Goals to be addressed in 8 weeks:  1. Pt will receive assistance with compression garment fitting and procurement, allowing potentially increased comfort, long-term maintenance of condition.   2. Patient to demonstrate reduction in R UE limb volume to no more than 12% larger than L UE.      Plan    Therapy options: will be seen for skilled physical therapy services    Planned therapy interventions: Body Mechanics Training, Compression Bandaging, Compression Pump, Diaphragmatic/Pursed-lip Breathing, Dry Needling, E-Stim, Education Therapist, art, Education - Patient, Endurance  Activites, Functional Mobility, Garment Measurement, Home Exercise Program, Manual Lymph Drainage, Manual Therapy, Neuromuscular Re-education, Postural Training, Self-Care/Home Training, Taping, TENS, Therapeutic Activities and Therapeutic Exercises      Frequency: 1x week    Duration in weeks: 8 weeks    Education provided to: patient.    Education provided: Surveyor, minerals, Equipment recommendations, HEP, Lymphedema precautions, Body awareness, Garment options, Manual lymph drainage, Posture, Treatment options and plan and Symptom management    Education results: needs reinforcement and needs further instruction.      Next visit plan:        MLD    Total Session Time: 60    Treatment rendered today:      ______________________________________________________________________    Evaluation, low complexity x 20'    Self-Care/Home Training x 30'  -review of lymphatic system anatomy and physiology  -review of self-manual lymphatic drainage  -review of compression garment use, care, procurement  -treatment options and plan  -CDT: what it involves, benefits vs barriers    ______________________________________________________________________            Subjective:   History of Present Condition  Date of Surgery: 05/07/2020 ([facility])    History of Present Condition/Chief Complaint:       R UE lymphedema    Subjective:     31 y.o.  female presents to outpatient physical therapy with discomfort and swelling around her R elbow that felt like when she was most swollen so she was a little concerned. She has worked with lymphedema therapist extensively since her breast cancer diagnosis in 2021. She had a reconstruction in March with fat grafting, no problems after that, things actually felt better. In November she has another procedure planned to swap out the R implant because it never dropped and will probably do more fat grafting.  Immediately after surgery she wasn't able to pull her sleeve on and then felt better so she didn't start wearing it again after. She also hasn't done the pump since the surgery because her arm felt great.     Therapist wore a mask for the entire session.     I reviewed the no-show/attendance policy with the patient and caregiver(s). The patient and/or family is aware that they must call to cancel appointments more than 24 hours in advance. They are also aware that if they late cancel (less than 24 hours from appointment), arrives greater than 15 minutes late, or no-show three times, we reserve the right to cancel their remaining appointments. This policy is in place to allow [place] to best serve the needs of our caseload.      Quality of life: good    Pain  Current pain rating: 0  At best pain rating: 0  At worst pain rating: 4  Location: R arm above elbow  Pain Related Behaviors: none  Progression: no change  Red flags: none.    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    Diagnostic Tests  No diagnostic tests performed        Treatments  Previous treatment: physical therapy  Patient Goals  Patient goals for therapy: decreased edema  Patient goal: manage symptoms before surgery      Objective:   Lymphedema:   Treatment:     Lymph node removal: Yes      Number positive:  3    Number removed:  19    Chemotherapy: No      Radiation therapy: No      Previous lymphedema treatment: Yes      Date completed:  10/22/2021    Lymphedema treatment type:  MLD, daytime garment, nighttime garment and compression pump    Reoccurrence?: Yes    Current Management:     Nighttime:  [employer]  Contraindications:     Contraindications:  None  Objective:     Location of swelling:  right, arm, forearm, elbow, wrist and hand    Fibrosis: Yes      Severity:  Minimal    Skin assessment:  Dry    Incision:  Healed    Color:  flesh    Temperature:  Normal    Hair growth:  Normal    Stemmer's sign:  Positive    Wound: No      Pitting: No    Tests:     DASH/Quick Dash Score:  9    Focus on Therapeutic Outcomes (out of 100):  72               Involved arm: Right   Dominant arm: Right   Patient position: Seated    Circumferences (cm)     Segment (cm) Right  02/28/2022 Left  02/28/2022   0 16.4 16.0   5 21.1 19.9   10 26.3 23.2   15 28.9 25.6   20 29.0 26.6   25 32.8 27.5   30 39.1 34.0   35 39.6 38.1   40 38.0 37.9   45 39.2 36.9   50 41.6 39.5     Involved arm: Right   Dominant arm: Right   Patient position: Seated    Volumes (mL)     Segment (cm) Right  02/28/2022 Left  02/28/2022   0 - 5 140.61 128.70   5 - 10 224.39 185.14   10 - 15 303.32 237.08   15 - 20 333.47 271.08   20 - 25 380.39 291.16   25 - 30 515.55 377.63   30 - 35 616.11 517.65   35 - 40 599.08 574.55   40 - 45 592.88 556.58   45 - 50 649.61 580.84     Inital values taken: 02/28/2022  9:01 AM   Involved arm: Right   Dominant arm: Right   Patient position: Seated   Total volume: right 4355.41 mL 02/28/2022  9:01 AM   Total volume: left 3720.41 mL 02/28/2022  9:01 AM   L/R difference +635.00 mL +17.07 %                                                 I attest that I have reviewed the above information.  Signed: Edwena Bunde, PT  02/28/2022 8:34 AM

## 2022-03-01 DIAGNOSIS — I890 Lymphedema, not elsewhere classified: Principal | ICD-10-CM

## 2022-03-03 ENCOUNTER — Ambulatory Visit: Admit: 2022-03-03 | Discharge: 2022-03-04 | Payer: PRIVATE HEALTH INSURANCE

## 2022-03-03 NOTE — Unmapped (Signed)
Encounter Provider: Ethlyn Gallery, PA  Date of Service: 03/03/2022  Primary Care Provider: Durenda Hurt, MD    Tami Gibson is a 31 y.o. female   ASSESSMENT       ICD-10-CM   1. Sprain of right ankle, unspecified ligament, initial encounter  S93.401A       PLAN:     - Home exercise program provided  - Patient will use OTC medication as needed for swelling/pain control  - Ice affected area for 15-20 minutes every hour as needed  - Provided patient with lace-up ankle brace. She may wean from brace as tolerated.   - Follow-up as needed if symptoms fail to improve or worsen.   - All relevant prior imaging was independently interpreted and reviewed by me prior to today's exam.  - I reviewed the diagnosis and discussed treatment options with the patient. The patient is amenable to the above plan. The patient was instructed to call or return to clinic if symptoms fail to improve as expected, there is any increasing pain, or any other concerns.    DME  For the diagnosis of The encounter diagnosis was Sprain of right ankle, unspecified ligament, initial encounter. the patient was prescribed a lace-up ankle brace.  The patient is ambulatory but has weakness and/or instability of their right lower extremity which requires stabilization from this semi-rigid/ rigid orthosis to improve their function.    Requested Prescriptions      No prescriptions requested or ordered in this encounter      Orders Placed This Encounter   Procedures   ??? Orthopedics DME Order       History:  Chief complaint: Right ankle pain    HPI:  Synethia Takach is a 30 y.o. female presenting for follow up of left ankle pain. Patient reports ground level fall after rolling her ankle on a curb. She was evaluated at [facility] on 02/21/22, x-rays were negative for fracture. She received a toradol injection, walking boot, and knee brace. Patient reports improvement in knee pain. Denies numbness/tingling distally.      Review of Systems Pertinent positives and negatives are documented in the HPI. All other systems reviewed are negative.   Medical History Past Medical History:   Diagnosis Date   ??? Acne     started during teen yrs., Accutane 2008-2009 during college yrs   ??? Anemia     During teen years only, no anemia recently   ??? 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 Gestational hypertension 01/30/2017    end of pregnancy had hypertension, was induced   ??? Headache     migraine occasionally   ??? Hypertension     during week 37 week, pt induced and had C/S due to hypertension   ??? 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 Kidney stone    ??? Malignant neoplasm of right breast in female, 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/30/2019   ??? Pyelonephritis 09/2016    Kidney stones   ??? Urinary tract infection     Had 5-6 in Lifetime, esp high school and college age   ??? Varicella     during childhood   ??? Vitamin B 12 deficiency       Surgical History Past Surgical History:   Procedure Laterality Date   ??? BREAST BIOPSY Right     IDC   ??? CESAREAN SECTION  01/30/2017    breech   ???  CHEMOTHERAPY     ??? IR INSERT PORT AGE GREATER THAN 5 YRS  12/08/2019    IR INSERT PORT AGE GREATER THAN 5 YRS 12/08/2019 Jobe Gibbon, MD IMG VIR H[facility]V [facility]   ??? LUMBAR PUNCTURE DIAGNOSTIC [facility] HISTORICAL RESULT)  01/2015    to relieve ICP   ??? PR BX/REMV,LYMPH NODE,DEEP AXILL Right 04/20/2020    Procedure: BX/EXC LYMPH NODE; OPEN, DEEP AXILRY NODE;  Surgeon: Moss Mc, MD;  Location: MAIN OR [facility];  Service: Surgical Oncology Breast   ??? PR CESAREAN DELIVERY ONLY N/A 01/30/2017    Procedure: CESAREAN DELIVERY ONLY;  Surgeon: Asher Muir, MD;  Location: L&D C-SECTION OR SUITES [facility];  Service: Maternal-Fetal Medicine   ??? PR CESAREAN DELIVERY ONLY N/A 02/17/2019    Procedure: CESAREAN DELIVERY ONLY;  Surgeon: Lonny Prude, MD;  Location: L&D C-SECTION OR SUITES [facility];  Service: Maternal-Fetal Medicine   ??? PR IMPLNT BIO IMPLNT FOR SOFT TISSUE REINFORCEMENT Bilateral 04/20/2020    Procedure: IMPLANTATION BIOLOGIC IMPLANT(EG, ACELLULAR DERMAL MATRIX) FOR SOFT TISSUE REINFORCEMENT(EG, BREAST, TRUNK);  Surgeon: Arsenio Katz, MD;  Location: MAIN OR [facility];  Service: Plastics   ??? PR INTRAOPERATIVE SENTINEL LYMPH NODE ID W DYE INJECTION Right 04/20/2020    Procedure: INTRAOPERATIVE IDENTIFICATION SENTINEL LYMPH NODE(S) INCLUDE INJECTION NON-RADIOACTIVE DYE, WHEN PERFORMED;  Surgeon: Moss Mc, MD;  Location: MAIN OR [place];  Service: Surgical Oncology Breast   ??? PR INTRAOPERATIVE SENTINEL LYMPH NODE ID W DYE INJECTION Right 05/07/2020    Procedure: INTRAOPERATIVE IDENTIFICATION SENTINEL LYMPH NODE(S) INCLUDE INJECTION NON-RADIOACTIVE DYE, WHEN PERFORMED;  Surgeon: Moss Mc, MD;  Location: MAIN OR [place];  Service: Surgical Oncology Breast   ??? PR MASTECTOMY, SIMPLE, COMPLETE Bilateral 04/20/2020    Procedure: MASTECTOMY, SIMPLE, COMPLETE;  Surgeon: Moss Mc, MD;  Location: MAIN OR [place];  Service: Surgical Oncology Breast   ??? PR REMOVE ARMPITS LYMPH NODES COMPLT Right 05/07/2020    Procedure: AXILLARY LYMPHADENECTOMY; COMPLETE;  Surgeon: Moss Mc, MD;  Location: MAIN OR [facility];  Service: Surgical Oncology Breast   ??? PR REPLACEMENT TISSUE EXPANDER W/PERMANENT IMPLANT Bilateral 08/13/2021    Procedure: REPLACEMENT OF TISSUE EXPANDER WITH PERMANENT IMPLANT;  Surgeon: Arsenio Katz, MD;  Location: MA[facility];  Service: Plastics   ??? PR TISSUE EXPANDER PLACEMENT BREAST RECONSTRUCTION Bilateral 04/20/2020    Procedure: TISSUE EXPANDER PLACEMENT IN BREAST RECONSTRUCTION, INCLUDING SUBSEQUENT EXPANSION(S);  Surgeon: Arsenio Katz, MD;  Location: MAIN OR [facility];  Service: Plastics   ??? TONSILECTOMY, ADENOIDECTOMY, BILATERAL MYRINGOTOMY AND TUBES     ??? WISDOM TOOTH EXTRACTION  2004      Allergies Patient has no known allergies. Medications She has a current medication list which includes the following prescription(s): abemaciclib, acetaminophen, calcium carbonate, cetirizine, cholecalciferol (vitamin d3-25 mcg (1,000 unit)), cyanocobalamin, docusate sodium, escitalopram oxalate, hydroquinone, letrozole, leuprolide, omeprazole, omeprazole, ondansetron, rimegepant, sumatriptan, topiramate, trazodone, vitamin e-134 mg (200 unit), and wegovy, and the following Facility-Administered Medications: leuprolide.   Family History {Her family history includes ALS in her maternal aunt; Breast cancer in her maternal cousin; Cancer in her paternal aunt and paternal grandmother; Cancer (age of onset: 75) in her maternal aunt; Hypertension in her father; Hypothyroidism in her mother; No Known Problems in her daughter, daughter, maternal aunt, maternal aunt, maternal grandfather, paternal aunt, paternal grandfather, and another family member; Other (age of onset: 67) in her father; Ovarian cancer (age of onset: 36) in her maternal grandmother.   Social History She reports that  she has never smoked. She has never used smokeless tobacco. She reports that she does not currently use alcohol. She reports that she does not use drugs.Home address:[address]  [place] [address]  Occupation:         Occupational History   ??? Occupation: Recreational therapist      Social History     Social History Narrative    PRECONCEPTUAL ASSESSMENT:        Javonte is a 31 y.o. Caucasian female    Caffeine:denies use    Cats: no    Exercise: not active    Diet: balanced    Family hx of of birth defects, chromosomal abnormality, intellectual disability, developmental delay: no.            PARTNER HISTORY:     Domingo Dimes is a 7 y.o.  Caucasian female.    Occupation:  Immunologist; [employer]    He has fathered children:  no    He is healthy with no chronic illness    Family history of  birth defects, chromosomal abnormality, intellectual disability, or developmental delay: no. illness    Family history of  birth defects, chromosomal abnormality, intellectual disability, or developmental delay: no.                     Exam:    Musculoskeletal   Right Foot and Ankle       Palpation: No tenderness to palpation. No other metatarsal, phalangeal, tarsal, lisfranc, malleoli tenderness, or calf tenderness.       Range of motion:  FROM dorsiflexion FROM  plantar flexion.  FROM  inversion and FROM  eversion of the ankle       Stability/Special test:  Negative Anterior Drawer, Negative Thompson Test.  stable forefoot abduction stress test.       Strength: 5/5 dorsiflexion 5/5 plantar flexion.  5/5 inversion and 5/5 eversion of the ankle       Inspection: positive soft tissue swelling, negative joint effusion,  negative erythema,  negative deformity, mild ecchymosis       Pulses: Dorsalis pedal pulses easily palpable        Neurologic: Grossly intact to sensation bilateral lower extremities     BMI Estimated body mass index is 33.91 kg/m?? as calculated from the following:    Height as of 02/21/22: 167.6 cm (5' 5.98).    Weight as of 02/21/22: 95.3 kg (210 lb).      Test Results    Imaging  Previous imaging was personally reviewed today which reveal no acute fracture or dislocation.      MEDICAL DECISION MAKING (level of service defined by 2/3 elements)     Number/Complexity of Problems Addressed 1 acute, uncomplicated illness or injury (99203/99213)   Amount/Complexity of Data to be Reviewed/Analyzed 2 points: Review prior notes (1 point per unique source); Review test results (1 point per unique test); Order tests (1 point per unique test) (99203/99213)   Risk of Complications/Morbidity/Mortality of Management Over-the-counter Medications (99203/99213)

## 2022-03-03 NOTE — Unmapped (Signed)
Thank you for choosing [facility]!  We appreciate the opportunity to participate in your care. Please let us know if we can be of assistance your orthopaedic issues in the future.     If you have questions or concerns, please do not hesitate to contact us by [facility] or by calling [phone] to speak with one of our clinical support sports team members.     We aim to provide you with the highest quality, individualized care.  If you have any unanswered questions after the visit, please do not hesitate to reach out to [place] on MyChart or leave a message for the nurse.    [place] MyChart Website: [email or web address]  ?  MyChart messages: These messages can be sent to your provider and will be checked by their clinical support staff.? The messages are checked throughout the day during normal business hours from 8:30 am-4:00 pm Monday-Friday, however responses may take up to 48 hours.? Please use this method of communication for non-urgent and non-emergent concerns, questions, refill requests or inquiries only.? ?Our team will help respond to all of your questions.? Please note that you may be asked to see a provider by either a telehealth or in person visit if it is deemed your questions are best handled in the clinic setting in person.??  ?  Please keep in mind, these messages are not real time communications, so be patient when waiting for a response.    If you do not have access to MyChart, do not know how to use MyChart or have an issue that may require more extensive discussion, please call the nurses' call line: [phone].? This line is checked throughout the day and will be responded to as time allows.? Please note that return calls could take up to 48 hours, depending on the nature of the need.?  ?  If you have an issue that requires emergent attention that cannot wait; either call the Orthopaedics resident on call at [phone], consider coming to our [facility] walk-in clinic, or go to the nearest Emergency Department.    If you need to schedule future appointments, please call [phone].     We look forward to seeing you again in the future and appreciate you choosing [facility] for your care!    Thank you,  Ethlyn Gallery, PA         [facility]:  Monday - Friday 8 am - 4 pm  [address]  2nd Floor, [address]  [place], [place]  [address]

## 2022-03-10 ENCOUNTER — Telehealth: Admit: 2022-03-10 | Discharge: 2022-03-11 | Payer: PRIVATE HEALTH INSURANCE | Attending: Clinical | Primary: Clinical

## 2022-03-11 NOTE — Unmapped (Unsigned)
CC: Pre-op    HPI:  Tami Gibson is a 31 y.o. female who presents for pre-op visit. She has a history of right breast IDC +/+/- s/p NACT and B SSM with right TAD (Dr. Teola Bradley) and immediate reconstruction with tissue expander placement on 04/20/20. Patients final pathology revealed residual disease with 2/3 positive lymph nodes and she underwent ALND with Dr. Teola Bradley on 05/07/20. She is on letrozole. She underwent adjuvant radiation (completed 08/06/20). She was originally interested in DIEP flap reconstruction but did not meet the BMI criteria. She underwent tissue expander to implant exchange (SCX 650 ml) with fat grafting to the bilateral breasts from the abdomen on 08/13/20. She took a 1 month course of Singulair to prevent capsular contracture.     She is now scheduled for revision of right breast and fat grafting to bilateral breasts on 04/16/2022.     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    followed by ophthalmologist    Kidney stone     Malignant neoplasm of right breast in female, 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/30/2019    Pyelonephritis 09/2016    Kidney stones    Urinary tract infection     Had 5-6 in Lifetime, esp high school and college age    Varicella     during childhood    Vitamin B 12 deficiency        Past Surgical History:  Past Surgical History:   Procedure Laterality Date    BREAST BIOPSY Right     IDC    CESAREAN SECTION  01/30/2017    breech    CHEMOTHERAPY      IR INSERT PORT AGE GREATER THAN 5 YRS 12/08/2019    IR INSERT PORT AGE GREATER THAN 5 YRS 12/08/2019 Jobe Gibbon, MD IMG VIR H&V [facility]    LUMBAR PUNCTURE DIAGNOSTIC [facility] HISTORICAL RESULT)  01/2015    to relieve ICP    PR BX/REMV,LYMPH NODE,DEEP AXILL Right 04/20/2020    Procedure: BX/EXC LYMPH NODE; OPEN, DEEP AXILRY NODE;  Surgeon: Moss Mc, MD;  Location: MAIN OR [facility];  Service: Surgical Oncology Breast    PR CESAREAN DELIVERY ONLY N/A 01/30/2017    Procedure: CESAREAN DELIVERY ONLY;  Surgeon: Asher Muir, MD;  Location: L&D C-SECTION OR SUITES [facility];  Service: Maternal-Fetal Medicine    PR CESAREAN DELIVERY ONLY N/A 02/17/2019    Procedure: CESAREAN DELIVERY ONLY;  Surgeon: Lonny Prude, MD;  Location: L&D C-SECTION OR SUITES [facility];  Service: Maternal-Fetal Medicine    PR IMPLNT BIO IMPLNT FOR SOFT TISSUE REINFORCEMENT Bilateral 04/20/2020    Procedure: IMPLANTATION BIOLOGIC IMPLANT(EG, ACELLULAR DERMAL MATRIX) FOR SOFT TISSUE REINFORCEMENT(EG, BREAST, TRUNK);  Surgeon: Arsenio Katz, MD;  Location: MAIN OR [facility];  Service: Plastics    PR INTRAOPERATIVE SENTINEL LYMPH NODE ID W DYE INJECTION Right 04/20/2020    Procedure: INTRAOPERATIVE IDENTIFICATION SENTINEL LYMPH NODE(S)  INCLUDE INJECTION NON-RADIOACTIVE DYE, WHEN PERFORMED;  Surgeon: Moss Mc, MD;  Location: MAIN OR [facility];  Service: Surgical Oncology Breast    PR INTRAOPERATIVE SENTINEL LYMPH NODE ID W DYE INJECTION Right 05/07/2020    Procedure: INTRAOPERATIVE IDENTIFICATION SENTINEL LYMPH NODE(S) INCLUDE INJECTION NON-RADIOACTIVE DYE, WHEN PERFORMED;  Surgeon: Moss Mc, MD;  Location: MAIN OR [facility];  Service: Surgical Oncology Breast    PR MASTECTOMY, SIMPLE, COMPLETE Bilateral 04/20/2020    Procedure: MASTECTOMY, SIMPLE, COMPLETE;  Surgeon: Moss Mc, MD;  Location: MAIN OR [facility];  Service: Surgical Oncology Breast    PR REMOVE ARMPITS LYMPH NODES COMPLT Right 05/07/2020    Procedure: AXILLARY LYMPHADENECTOMY; COMPLETE;  Surgeon: Moss Mc, MD;  Location: MAIN OR [facility];  Service: Surgical Oncology Breast    PR REPLACEMENT TISSUE EXPANDER W/PERMANENT IMPLANT Bilateral 08/13/2021    Procedure: REPLACEMENT OF TISSUE EXPANDER WITH PERMANENT IMPLANT;  Surgeon: Arsenio Katz, MD;  Location: MAIN OR [place];  Service: Plastics    PR TISSUE EXPANDER PLACEMENT BREAST RECONSTRUCTION Bilateral 04/20/2020    Procedure: TISSUE EXPANDER PLACEMENT IN BREAST RECONSTRUCTION, INCLUDING SUBSEQUENT EXPANSION(S);  Surgeon: Arsenio Katz, MD;  Location: MAIN OR [place];  Service: Plastics    TONSILECTOMY, ADENOIDECTOMY, BILATERAL MYRINGOTOMY AND TUBES      WISDOM TOOTH EXTRACTION  2004       Medications:  Current Outpatient Medications   Medication Sig Dispense Refill    abemaciclib (VERZENIO) 100 mg Tab tablet Take 1 tablet (100 mg total) by mouth Two (2) times a day. Take with or without food. 56 tablet 5    acetaminophen (TYLENOL) 325 MG tablet Take 2 tablets (650 mg total) by mouth every six (6) hours as needed for pain.      calcium carbonate (TUMS) 200 mg calcium (500 mg) chewable tablet Chew 1 tablet (200 mg of elem calcium total) daily.      cetirizine (ZYRTEC) 10 MG tablet Take 1 tablet (10 mg total) by mouth daily. 30 tablet 2    cetirizine (ZYRTEC) 5 MG chewable tablet Chew 1 tablet (5 mg total) daily.      cholecalciferol, vitamin D3-25 mcg, 1,000 unit,, 25 mcg (1,000 unit) capsule Take 1 capsule (25 mcg total) by mouth.      cyanocobalamin 100 MCG tablet Take 1 tablet (100 mcg total) by mouth daily.      cyanocobalamin, vitamin B-12, 1000 MCG tablet Take 1 tablet (1,000 mcg total) by mouth daily.      docusate sodium (COLACE) 100 MG capsule Take 1 capsule (100 mg total) by mouth nightly as needed (PRN).      escitalopram oxalate (LEXAPRO) 20 MG tablet Take 1 tablet (20 mg total) by mouth daily. 90 tablet 3    hydroquinone 4 % cream APPLY CREAM TOPICALLY TWICE DAILY UP TO 3 MONTHS letrozole (FEMARA) 2.5 mg tablet Take 1 tablet (2.5 mg total) by mouth daily. 90 tablet 3    leuprolide (LUPRON) 3.75 mg injection Inject 3.75 mg into the muscle.      omeprazole (PRILOSEC) 20 MG capsule Take 1 capsule by mouth once daily 90 capsule 0    omeprazole (PRILOSEC) 20 MG capsule Take 1 capsule (20 mg total) by mouth daily. 90 capsule 0    ondansetron (ZOFRAN-ODT) 4 MG disintegrating tablet DISSOLVE 1 TABLET IN MOUTH EVERY 8 HOURS AS NEEDED FOR NAUSEA 18 tablet 0    rimegepant (NURTEC ODT) 75 mg TbDL Take 1 tablet (75 mg total) by mouth.  rimegepant (NURTEC ODT) 75 mg TbDL Take 1 tablet (75 mg total) by mouth daily as needed.      SUMAtriptan (IMITREX) 100 MG tablet TAKE ONE TABLET BY MOUTH AT ONSET OF MIGRAINE FOR UP TO 1 DOSE . IF SYMPTOMS PERSIST A SECOND DOSE MAY BE TAKEN IN 2 HOURS IF NEEDED      SUMAtriptan (IMITREX) 25 MG tablet Take 1 tablet (25 mg total) by mouth.      topiramate (TOPAMAX) 25 MG tablet TAKE 3 TABLETS BY MOUTH ONCE DAILY      traZODone (DESYREL) 50 MG tablet TAKE 1/2 TO 1 (ONE-HALF TO ONE) TABLET BY MOUTH NIGHTLY AS NEEDED FOR SLEEP 30 tablet 0    vitamin E-134 mg, 200 UNIT, 134 mg (200 UNIT) capsule Take 1 capsule (134 mg total) by mouth daily.      WEGOVY 1.7 MG/0.75 ML SUBCUTANEOUS PEN INJECTOR Inject 0.75 mL (1.7 mg total) under the skin every seven (7) days. 3 mL 3     No current facility-administered medications for this visit.     Facility-Administered Medications Ordered in Other Visits   Medication Dose Route Frequency Provider Last Rate Last Admin    leuprolide (LUPRON) 3.75 mg injection                Allergies:  No Known Allergies    Family History:   Family History   Problem Relation Age of Onset    Cancer Maternal Aunt 45        breast Ca    Hypertension Father     Other Father 47        Benign brain tumor    Hypothyroidism Mother     Cancer Paternal Aunt         Breast Ca dx in 55s    ALS Maternal Aunt     Ovarian cancer Maternal Grandmother 51    No Known Problems Maternal Grandfather     Cancer Paternal Grandmother         Unknown Cancer    No Known Problems Paternal Grandfather     No Known Problems Maternal Aunt     No Known Problems Maternal Aunt     Breast cancer Maternal Cousin         Dx in 1s    No Known Problems Paternal Aunt     No Known Problems Daughter     No Known Problems Daughter     No Known Problems Other     BRCA 1/2 Neg Hx     Colon cancer Neg Hx     Endometrial cancer Neg Hx     The patient reports no family history for bleeding disorders or anesthetic problems.    Social History:  Social History     Socioeconomic History    Marital status: Married     Spouse name: Dylan    Number of children: 0   Occupational History    Occupation: Architect    Tobacco Use    Smoking status: Never    Smokeless tobacco: Never   Vaping Use    Vaping Use: Never used   Substance and Sexual Activity    Alcohol use: Not Currently    Drug use: No    Sexual activity: Yes     Partners: Male     Birth control/protection: Pill   Other Topics Concern    Exercise Yes    Living Situation No   Social History Narrative    PRECONCEPTUAL  ASSESSMENT:        Alexandra is a 31 y.o. Caucasian female    Caffeine:denies use    Cats: no    Exercise: not active    Diet: balanced    Family hx of of birth defects, chromosomal abnormality, intellectual disability, developmental delay: no.            PARTNER HISTORY:     Domingo Dimes is a 34 y.o.  Caucasian female.    Occupation:  Immunologist; [employer]    He has fathered children:  no    He is healthy with no chronic illness    Family history of  birth defects, chromosomal abnormality, intellectual disability, or developmental delay: no.              Social Determinants of Health     Financial Resource Strain: Low Risk  (04/21/2020)    Overall Financial Resource Strain (CARDIA)     Difficulty of Paying Living Expenses: Not very hard   Food Insecurity: No Food Insecurity (04/21/2020)    Hunger Vital Sign     Worried About Running Out of Food in the Last Year: Never true     Ran Out of Food in the Last Year: Never true   Transportation Needs: No Transportation Needs (04/21/2020)    PRAPARE - Therapist, art (Medical): No     Lack of Transportation (Non-Medical): No       ROS:   Otherwise, 12 point review of systems was completed and is negative except as per HPI.      Vitals: There were no vitals filed for this visit.  ?  Physical Exam:   Gen: AA in NAD  HEENT: NCAT, EOMI, PERRL, Non icteric sclerae, MMM  CV: RRR  RESP: quiet respirations on room air  EXT: warm and well perfused  BREASTS: ***  ?  Impression and Plan:  Kalan Rang is here today for pre-op before revision of breast reconstruction.     - OR for revision procedure for the right with lowering the right breast and fat grafting to bilateral breasts, scar at the bottom of the right breast, possible implant exchange on the right    No follow-ups on file.

## 2022-03-12 NOTE — Unmapped (Signed)
APPOINTMENTS / QUESTIONS  For NON-urgent questions regarding appointments or your surgical care, you may reach out to your provider and/or their nursing staff via MyChart. Please keep in mind that it may take up to 2-3 business days for a response.    Additionally, you may contact our clinics, Monday through Friday, 8 am to 4:30 pm    [facility]  [address],   [place] [address]  T: [phone]  F: [phone]    [facility]  [address] [place], [place] [address]  Ground [facility]  T: [phone]  F: [phone]    [facility]  [address], [place], [place] [address]  T: [phone]  F: [phone]    [facility] Plastic Surgery at [facility]  [address] [place], [place] [address]  Third Floor, [address]  T: [phone]   F: [phone]    Ambulatory Surgery Center  1st Floor, Ambulatory Care Center  [address]  [place], [place] [address]  T: [phone]  Website:  [facility]    Administrative Office  [address]  [place], [place] [address]  T:  [phone]  F:  [phone]      Physicians:  Epimenio Sarin MD:   Nurse:  Vista Mink BSN, RN [phone]    Surgery Scheduler: Lysle Morales  [phone]     Tarry Kos MD:   Nurse:  [name] BSN, RN, CPSN [phone]  Surgery Scheduler:   Lysle Morales  [phone]     Calvert Cantor MD   Nurse: Cutillo Milo BSN, RN [phone]   Surgery Scheduler: Lysle Morales  [phone]     Adeyemi Clydie Braun MD:  Nurse: Jasmine December BSN, RN, [place]  [phone]  Surgery Scheduler: Kristine Royal [phone]    Darleene Cleaver MD:  Nurse: Venetia Maxon BSN, RN, CPSN  [phone]  Surgery Scheduler: Kristine Royal [phone]    Advance Practice Providers:  Ezekiel Slocumb, DNP, NP-C:    Nurse:  Hobday Milo BSN, RN [phone]  Surgery Scheduler: Kristine Royal [phone]     Rush Landmark, PA-C:    Nurse:  Nichelson Milo BSN, RN [phone]  Surgery Scheduler: Kristine Royal [phone]    Financial Navigator:  Arlee Muslim  Hours:  7:30 am to 4 pm  P: [phone] - F: [phone]           Billing Questions/Financial Navigation:  [phone]  [facility] Customer Service Call Center:  [phone]    Surgery Scheduling:  You will receive a phone call from the surgery schedulers regarding date for surgery in 1-2 weeks from  your appointment.    Please call one of the above numbers if you have not received a phone call 2 weeks after your appointment with the provider.      [facility] Contact Information:   Please call to schedule  *Radiology (CT, X-ray, Ultrasound) [phone]  *Mammography & Breast Ultrasound [phone]  *MRI [phone]  *Interventional Radiology Procedure Scheduling [phone]    AFTER HOURS/HOLIDAYS:  For emergencies after-hours (after 5pm, or weekends): call the [facility] operator [phone]) and ask to page the Plastic Surgery resident on call. You will be directed to a surgery resident who likely is not immediately aware of the details of your case, but can help you deal with any emergencies that cannot wait until regular business hours.  Please be aware that this person  is responding to many in-hospital emergencies and patient issues and may not answer your phone call immediately, but will return your call as soon as possible.    Precare Location:  [employer] Pre-Procedure Services at [facility]   [address]  [place], [place] [address] ([address] - old [place] [address] near [employer])    Blood Work Location:  [address] [place] [address]. There are no suite numbers, but it is located on the first floor there. Go to the main desk and tell them you are there for walk-in labs.  Precare:   The day prior to your scheduled surgery, Pre-Care will call you with instructions.  If you have not heard from them by 4PM, and would like to check on the status of your surgery, please call:  [facility]: [phone]  [facility]: [phone]  [facility]:  [phone]    Insurance Denials:  All appeal initiation needs to be started by the patient.  We do not appeal insurance denials.     Weather Hotline:  [phone]    FLMA forms and paperwork:  Please allow  a 2 week turn around for forms to be completed and faxed.

## 2022-03-12 NOTE — Unmapped (Signed)
[facility] Refill Coordination Note    Specialty Medication(s) to be Shipped:   Hematology/Oncology: Verzenio 100mg     Other medication(s) to be shipped: No additional medications requested for fill at this time     Tami Gibson, DOB: September 27, 1990  Phone: [phone] (home)       All above HIPAA information was verified with patient.     Was a Nurse, learning disability used for this call? No    Completed refill call assessment today to schedule patient's medication shipment from the [facility] [phone]).  All relevant notes have been reviewed.     Specialty medication(s) and dose(s) confirmed: Regimen is correct and unchanged.   Changes to medications: Particia reports no changes at this time.  Changes to insurance: No  New side effects reported not previously addressed with a pharmacist or physician: None reported  Questions for the pharmacist: No    Confirmed patient received a Conservation officer, historic buildings and a Surveyor, mining with first shipment. The patient will receive a drug information handout for each medication shipped and additional FDA Medication Guides as required.       DISEASE/MEDICATION-SPECIFIC INFORMATION        N/A    SPECIALTY MEDICATION ADHERENCE     Medication Adherence    Patient reported X missed doses in the last month: 0  Specialty Medication: VERZENIO 100 mg Tab tablet (abemaciclib)  Patient is on additional specialty medications: No  Informant: patient                          Were doses missed due to medication being on hold? No    Verzenio 100 mg: 10 days of medicine on hand     REFERRAL TO PHARMACIST     Referral to the pharmacist: Not needed      [facility]     Shipping address confirmed in Epic.     Delivery Scheduled: Yes, Expected medication delivery date: 03/19/22.     Medication will be delivered via Next Day Courier to the prescription address in Epic WAM.    [name]   [facility] Specialty Technician

## 2022-03-13 ENCOUNTER — Telehealth: Admit: 2022-03-13 | Discharge: 2022-03-14 | Payer: PRIVATE HEALTH INSURANCE

## 2022-03-13 DIAGNOSIS — C50911 Malignant neoplasm of unspecified site of right female breast: Principal | ICD-10-CM

## 2022-03-13 DIAGNOSIS — Z170 Estrogen receptor positive status [ER+]: Principal | ICD-10-CM

## 2022-03-13 NOTE — Unmapped (Signed)
Patient here for video visit prior to revision of right breast and fat grafting to bilateral breasts on 04/16/2022. No health changes since last being seen in August. She was wondering what the recovery period post op would be.     Plan for revision procedure for the right with lowering the right breast and fat grafting to bilateral breasts, scar at the bottom of the right breast, implant exchange on the right (current implant is SCX 650). All questions answered over the 7 minute call.

## 2022-03-17 ENCOUNTER — Institutional Professional Consult (permissible substitution): Admit: 2022-03-17 | Discharge: 2022-03-17 | Payer: PRIVATE HEALTH INSURANCE

## 2022-03-17 ENCOUNTER — Ambulatory Visit: Admit: 2022-03-17 | Discharge: 2022-03-17 | Payer: PRIVATE HEALTH INSURANCE

## 2022-03-17 DIAGNOSIS — Z170 Estrogen receptor positive status [ER+]: Principal | ICD-10-CM

## 2022-03-17 DIAGNOSIS — C50911 Malignant neoplasm of unspecified site of right female breast: Principal | ICD-10-CM

## 2022-03-17 MED ADMIN — leuprolide (LUPRON) 3.75 mg injection: INTRAMUSCULAR | @ 12:00:00 | Stop: 2022-03-17

## 2022-03-17 MED ADMIN — leuprolide (LUPRON) injection 3.75 mg: 3.75 mg | INTRAMUSCULAR | @ 12:00:00 | Stop: 2022-03-17

## 2022-03-17 NOTE — Unmapped (Signed)
Patient received LUPRON 3.75 mg  in RUOQ at patient request.. Tolerated well. BandAid Applied. Patient education provided.

## 2022-03-18 MED FILL — VERZENIO 100 MG TABLET: ORAL | 28 days supply | Qty: 56 | Fill #5

## 2022-03-20 NOTE — Unmapped (Signed)
[facility] THERAPY SERVICES [place]  OUTPATIENT PHYSICAL THERAPY  03/20/2022  Note Type: Treatment Note       Patient Name: Tami Gibson  Date of Birth:1991-01-04  Diagnosis:   Encounter Diagnoses   Name Primary?    Lymphedema syndrome, postmastectomy Yes    Malignant neoplasm of right breast in female, estrogen receptor positive, unspecified site of breast (CMS-HCC)     Scar condition and fibrosis of skin     Lymphedema      Referring MD:  [name], MD    Date of Onset of Impairment-No date available  Date PT Care Plan Established or Reviewed-No date available  Date PT Treatment Started-No date available   Plan of Care Effective Date:     Oncology History Overview Note   31 year old female with recently self palpated right breast lump. A targeted right breast ultrasound on 11/15/2019 showed an irregular mass with spiculated margins at the 7:00 position, 6 cm from the nipple, measuring 1.4 x 1.2 x 1.0 cm. There were 2 abnormal level 1 axillary lymph nodes measuring 1.4 x 1.3 cm and 1.2 x 0.9 cm. Additionally, there was a high level 1/level 2 axillary lymph node, measuring 2.1 x 1.3 cm with an effaced fatty hilum.      Malignant neoplasm of right breast in female, estrogen receptor positive (CMS-HCC)   11/09/2019 Initial Diagnosis    Malignant neoplasm of right breast in female, estrogen receptor positive (CMS-HCC)     11/15/2019 Biopsy    Invasive ductal carcinoma  - Nottingham combined histologic grade: 1  ER 91-100%, PR 91-100%, HER2 negative by IHC    Lymph node, right axilla, core biopsy  - Lymph node positive for metastatic carcinoma,      12/02/2019 -  Cancer Staged    Staging form: Breast, AJCC 8th Edition  - Clinical: Stage IB (cT1c, cN1, cM0, G1, ER+, PR+, HER2-) - Signed by [name], MD on 12/02/2019       12/09/2019 - 03/22/2020 Chemotherapy    OP BREAST AC (Dose Dense) q2W X 4 CYCLES, THEN PACLITAXEL (DOSE DENSE) Q2W x 4 CYCLES  DOXOrubicin 60 mg/m2 IV on day 1, cyclophosphamide 600 mg/m2 IV on day 1, every 2 weeks for 4 cycles, then PACLitaxel 175 mg/m2 every 2 weeks for 4 cycles      04/20/2020 Surgery    Breast, right, simple mastectomy  -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 Extracapsular extension is identified      Breast, left, simple mastectomy  -Benign breast tissue with no atypia, in situ or invasive carcinoma identified     04/20/2020 -  Cancer Staged    Staging form: Breast, AJCC 8th Edition  - Pathologic stage from 04/20/2020: No Stage Recommended (ypT1c, pN1a, cM0, G2, ER+, PR+, HER2-) - Signed by [name], MD on 04/25/2020       05/07/2020 Surgery    Lymph nodes, right axillary, dissection  -One of sixteen lymph nodes involved by metastatic adenocarcinoma (1/16)  -Metastatic focus measures 3 mm in greatest dimension  -No extracapsular extension is identified     06/28/2020 - 08/06/2020 Radiation    The total radiation dose will be 5000 cGy at 200 cGy/fraction for a total of 25 fractions, treated once a day     08/13/2020 Endocrine/Hormone Therapy    Letrozole 2.5mg  +Ovarian suppression (  monthly lupron)     Malignant neoplasm of right breast in female, estrogen receptor positive (CMS-HCC)   05/07/2020 Surgery    Lymph nodes, right axillary, dissection  -One of sixteen lymph nodes involved by metastatic adenocarcinoma (1/16)  -Metastatic focus measures 3 mm in greatest dimension  -No extracapsular extension is identified     06/13/2020 -  Radiation    Radiation Therapy Treatment Details (Noted on 06/13/2020)  Site: Right Breast - Overlapping sites  Technique: 3D CRT  Goal: No goal specified  Planned Treatment Start Date: No planned start date specified     06/21/2020 -  Radiation    Radiation Therapy Treatment Details (Noted on 06/21/2020)  Site: Right Breast - Overlapping sites  Technique: 3D CRT  Goal: No goal specified  Planned Treatment Start Date: No planned start date specified           Assessment/Plan:    Assessment  Assessment details:    Patient tolerated manual lymphatic drainage well with no complaint during or after treatment. Patient verbalizes understanding of self-manual lymphatic drainage technique as well as appropriate use of garment and pump.  31 y.o. female presents with Stage I lymphedema secondary to breast cancer treatment.  In addition to lymphedema , the patient is experiencing these side effects from cancer treatments: Myofascial/scar restrictions post op  Radiation: Fibrosis/myofascial restrictions  Currently the right limb is 17 % bigger than the left limb.   Patient requires skilled Physical Therapy services  for the following problem list and secondary functional limitations:    Problem List:   Lack of knowledge of lymphedema/edema self care  Uncontrolled swelling and/or lack of appropriate compression garment    Secondary Functional Limitations:  Decreased knowledge of self care  of lymphedema/edema puts patient at risk for increased swelling and infection  Uncontrolled swelling can increase chances of  infection, and limit functional ROM             Impairments: postural weakness, increased edema, impaired sensation, muscular restrictions, uncontrolled swelling, impaired ADLs, decreased skin integrity and decreased knowledge of self-care                Prognosis: excellent prognosis    Positive Prognosis Rationale: motivated for treatment, caregiver/family support, chronicity of condition, severity of symptoms, response to trial tx and previous tx with benefit.    Barriers to therapy: none identified    Therapy Goals      Goals:      Short Term Goals to be addressed in 4 weeks:  1. Pt will be independent with home stretching, ROM HEP to allow progression towards long-term goals, potentially increased comfort, ROM.   2. Pt will participate in MLD, be educated in self MLD to promote improved fluid transport, potentially decreased R breast and/or UE edema.  3. Pt will be able to articulate skin care recommendations, as well as signs/symptoms of infection, to promote long-term skin health, infection prevention, long-term maintenance of condition.    Long-Term Goals to be addressed in 8 weeks:  1. Pt will receive assistance with compression garment fitting and procurement, allowing potentially increased comfort, long-term maintenance of condition.   2. Patient to demonstrate reduction in R UE limb volume to no more than 12% larger than L UE.      Plan    Therapy options: will be seen for skilled physical therapy services    Planned therapy interventions: Body Mechanics Training, Compression Bandaging, Compression Pump, Diaphragmatic/Pursed-lip Breathing, Dry Needling, E-Stim, Education Therapist, art, Education -  Patient, Endurance Activites, Functional Mobility, Garment Measurement, Home Exercise Program, Manual Lymph Drainage, Manual Therapy, Neuromuscular Re-education, Postural Training, Self-Care/Home Training, Taping, TENS, Therapeutic Activities and Therapeutic Exercises      Frequency: 1x week    Duration in weeks: 8 weeks    Education provided to: patient.    Education provided: Equipment recommendations, HEP, Garment options, Manual lymph drainage and Treatment options and plan    Education results: needs reinforcement and needs further instruction.      Next visit plan:        MLD    Total Session Time: 25    Treatment rendered today:      ______________________________________________________________________    Self-Care/Home Training x 10'  -basic pump option [place] [facility]: very appropriate   -nighttime garment option: lower profile with loops to assist with donning  -process for getting fitted for new sleeve with P&O    Manual Therapy x 45'  MLD, including   Short neck  L axillary  R inguinal  R to L anterior A-A anastamosis  R A-I anastamosis  Posterior A-A anastamosis  R UE  R breast -kinesiotape to ant and post A-A anastomosis, R elbow to shoulder      ______________________________________________________________________            Subjective:   History of Present Condition  Date of Surgery: 05/07/2020 ([facility])    History of Present Condition/Chief Complaint:       R UE lymphedema    Subjective:     Patient states that she's been doing well, no change in her UE swelling since last visit. Her sleeves don't fit her, she will be measured by P&O on 10/26. [place]     Therapist wore a mask for the entire session.     I reviewed the no-show/attendance policy with the patient and caregiver(s). The patient and/or family is aware that they must call to cancel appointments more than 24 hours in advance. They are also aware that if they late cancel (less than 24 hours from appointment), arrives greater than 15 minutes late, or no-show three times, we reserve the right to cancel their remaining appointments. This policy is in place to allow [place] to best serve the needs of our caseload.      Quality of life: good    Pain  Current pain rating: 0  At best pain rating: 0  At worst pain rating: 4  Location: R arm above elbow      Precautions and Equipment  Precautions: Cancer history  Prior Functional Status:     Functional Limitation(s)-No physical limitations  Current functional status: limited recreation and limited exercise        Treatments  Previous treatment: physical therapy      Patient Goals  Patient goals for therapy: decreased edema  Patient goal: manage symptoms before surgery      Objective:   Lymphedema                       Inital values taken: 02/28/2022  9:01 AM   Involved arm: Right   Dominant arm: Right   Patient position: Seated   Total volume: right 4355.41 mL 02/28/2022  9:01 AM   Total volume: left 3720.41 mL 02/28/2022  9:01 AM   L/R difference +635.00 mL +17.07 %          Inital values taken: 02/28/2022  9:01 AM   Involved arm: Right   Dominant arm: Right   Patient position: Seated   Total volume: right 4355.41 mL 02/28/2022  9:01 AM   Total volume: left 3720.41 mL 02/28/2022  9:01 AM   L/R difference +635.00 mL +17.07 %                                         I attest that I have reviewed the above information.  Signed: Edwena Bunde, PT  03/20/2022 7:39 AM

## 2022-03-21 DIAGNOSIS — C50911 Malignant neoplasm of unspecified site of right female breast: Principal | ICD-10-CM

## 2022-03-21 DIAGNOSIS — Z170 Estrogen receptor positive status [ER+]: Principal | ICD-10-CM

## 2022-03-26 ENCOUNTER — Ambulatory Visit: Admit: 2022-03-26 | Discharge: 2022-03-27 | Payer: PRIVATE HEALTH INSURANCE

## 2022-03-26 ENCOUNTER — Other Ambulatory Visit: Admit: 2022-03-26 | Discharge: 2022-03-27 | Payer: PRIVATE HEALTH INSURANCE

## 2022-03-26 DIAGNOSIS — C50911 Malignant neoplasm of unspecified site of right female breast: Principal | ICD-10-CM

## 2022-03-26 DIAGNOSIS — Z170 Estrogen receptor positive status [ER+]: Principal | ICD-10-CM

## 2022-03-26 LAB — COMPREHENSIVE METABOLIC PANEL
ALBUMIN: 3.9 g/dL (ref 3.4–5.0)
ALKALINE PHOSPHATASE: 80 U/L (ref 46–116)
ALT (SGPT): 7 U/L — ABNORMAL LOW (ref 10–49)
ANION GAP: 8 mmol/L (ref 5–14)
AST (SGOT): 16 U/L (ref <=34)
BILIRUBIN TOTAL: 0.4 mg/dL (ref 0.3–1.2)
BLOOD UREA NITROGEN: 9 mg/dL (ref 9–23)
BUN / CREAT RATIO: 8
CALCIUM: 9.2 mg/dL (ref 8.7–10.4)
CHLORIDE: 107 mmol/L (ref 98–107)
CO2: 26 mmol/L (ref 20.0–31.0)
CREATININE: 1.13 mg/dL — ABNORMAL HIGH
EGFR CKD-EPI (2021) FEMALE: 66 mL/min/{1.73_m2} (ref >=60)
GLUCOSE RANDOM: 75 mg/dL (ref 70–179)
POTASSIUM: 3.7 mmol/L (ref 3.4–4.8)
PROTEIN TOTAL: 7.2 g/dL (ref 5.7–8.2)
SODIUM: 141 mmol/L (ref 135–145)

## 2022-03-26 LAB — CBC W/ AUTO DIFF
BASOPHILS ABSOLUTE COUNT: 0 10*9/L (ref 0.0–0.1)
BASOPHILS RELATIVE PERCENT: 0.7 %
EOSINOPHILS ABSOLUTE COUNT: 0 10*9/L (ref 0.0–0.5)
EOSINOPHILS RELATIVE PERCENT: 1.1 %
HEMATOCRIT: 34.1 % (ref 34.0–44.0)
HEMOGLOBIN: 11.7 g/dL (ref 11.3–14.9)
LYMPHOCYTES ABSOLUTE COUNT: 1.5 10*9/L (ref 1.1–3.6)
LYMPHOCYTES RELATIVE PERCENT: 35.4 %
MEAN CORPUSCULAR HEMOGLOBIN CONC: 34.3 g/dL (ref 32.0–36.0)
MEAN CORPUSCULAR HEMOGLOBIN: 29.4 pg (ref 25.9–32.4)
MEAN CORPUSCULAR VOLUME: 85.7 fL (ref 77.6–95.7)
MEAN PLATELET VOLUME: 7.4 fL (ref 6.8–10.7)
MONOCYTES ABSOLUTE COUNT: 0.3 10*9/L (ref 0.3–0.8)
MONOCYTES RELATIVE PERCENT: 6 %
NEUTROPHILS ABSOLUTE COUNT: 2.4 10*9/L (ref 1.8–7.8)
NEUTROPHILS RELATIVE PERCENT: 56.8 %
PLATELET COUNT: 214 10*9/L (ref 150–450)
RED BLOOD CELL COUNT: 3.98 10*12/L (ref 3.95–5.13)
RED CELL DISTRIBUTION WIDTH: 14.6 % (ref 12.2–15.2)
WBC ADJUSTED: 4.3 10*9/L (ref 3.6–11.2)

## 2022-03-26 MED ADMIN — influenza vaccine quad (FLUARIX, FLULAVAL, FLUZONE) (6 MOS & UP) 2023-24: .5 mL | INTRAMUSCULAR | @ 18:00:00 | Stop: 2022-03-26

## 2022-03-26 NOTE — Unmapped (Signed)
Pt tolerated influenza vaccine injection administered in left deltoid without difficulty. Band aid applied. [facility] left [facility] ambulatory, steady gait, NAD, no questions, complaints, nor concerns voiced at d/c.

## 2022-03-26 NOTE — Unmapped (Signed)
It was a pleasure to see you today in the [facility].      For clinical concerns during working hours, please call [phone].     For clinical trial questions please call the study coordinator.     For emergencies, evenings or weekends, please call [phone] and ask for the oncology fellow on call.  Reasons to call the emergency line may include:  - Fever of 100.5 or greater  - Nausea and/or vomiting not relieved with nausea medicine  - Diarrhea or constipation not relieved with bowel regimen  - Severe pain not relieved with usual pain regimen

## 2022-03-26 NOTE — Unmapped (Signed)
Breast Oncology Return Patient Evaluation  Referring Physician: [name], Md  [address]  [facility]  [place],  [place] [address].  PCP: Durenda Hurt, MD  Breast Med/Onc: Adonis Brook, MD    Cancer Team  Surgical Oncology: Jobe Marker, MD  Radiation Oncology: Rayetta Humphrey, MD  Medical Oncology: Adonis Brook, MD  Plastic Surgery: Levan Hurst, MD  Genetics: Audree Camel, MD    Reason for Visit: A 31 y.o. female with breast cancer referred for consultation for recommendations concerning the management of breast cancer.    Assessment/Plan:    Cancer Staging   Malignant neoplasm of right breast in female, estrogen receptor positive (CMS-HCC)  Staging form: Breast, AJCC 8th Edition  - Clinical: Stage IB (cT1c, cN1, cM0, G1, ER+, PR+, HER2-) - Signed by [name], MD on 12/02/2019  - Pathologic stage from 04/20/2020: No Stage Recommended (ypT1c, pN1a, cM0, G2, ER+, PR+, HER2-) - Signed by [name], MD on 04/25/2020     #Stage IB (cT1c, cN1, cM0, G1, ER+, PR+, HER2-)right breast cancer    Neoadjuvant chemotherapy  Completed ddAC followed by DDTaxol on 03/21/20    Locoregional therapy  -s/p Right mastectomy with SLN and prophylactic left mastectomy  -Pathology consistent with residual disease, MD Dareen Piano residual cancer burden class: RCB-III  With 2/3 positive LNs    - ALND completed on 05/07/2020-1/16 lymph nodes involved. Total 3 LNs involved by metastatic adenocarcinoma  -Completed adjuvant RT on 08/06/2020  -Completed tissue expander exchange for permanent implants, liposuction from abdomen and fat grafting to bilateral breasts on 08/13/2021    Adjuvant therapy  Ovarian suppression (lupron) + Letrozole - SOT 08/09/2020  Adjuvant Abemaciclib - SOT 08/29/2020. Dose reduced to 100mg  BID in June 2022 to improve tolerance. She reports tolerating well.   -Continue Abemaciclib 100 mg BID     Staging scans  -Patient had baseline staging scans which are negative for any metastatic disease.   -Repeat scans on March 2022 showed no evidence of metastasis; ( this was completed in the setting of the patient needing reassurance)     #Hot flashes  -Patient endorses hot flashes since initiation of aromatase inhibition therapy; she reports taking vit E which has been helpful.   -Discussed pharmacologic options including oxybutynin, gabapentin, or cross-tapering escitalopram (managed by PCP for anxiety) for venlafaxine, but patient continues opts for no pharmacologic therapy at this time    #Syncope/Presyncope  -Patient has had multiple episodes of presyncope with tunnel vision and one episode of syncope with preceding tunnel vision and lightheadedness all occurring approximately 4 to 5 days after chemo infusions.  No positional component and she is trying to stay well-hydrated.   -Extensive work-up has been unrevealing, including labs, chest CTA, brain MRI, CT head.  TTE prior to start of chemotherapy showed structurally normal heart. EKG was also normal at the time and 2 week Zio-patch w/o evidence of any concerning arrhythmia  -Overall, presentation seems most consistent with vasovagal etiology, unlikely to be cardiogenic. No symptoms since she was done with [facility].  - Cardiology following - followup limited TTE unremarkable  -Resolved    # Bone health  -Baseline bone mineral density evaluation prior to starting an aromatase inhibitor was normal  -Repeat in March 2024      # Supportive care  - Psychosocial: Supportive mother and husband. Follows with [facility] and [facility]  -Heartburn: Daily PPI prescribed  -Hotflashes: as above  -Lymphedema: she notes less lymphedema and less restriction in  her right arm since the fat grafting, seeing PT again.  -Diarrhea: Imodium as needed.  No complaints today      DISPO:  --Fu in 3 months, labs prior  --Continue lupron monthly with nurse visits    -----------------------------------------------------------------------------------------------------------------------------------------------    HPI: Tami Gibson is a 31 y.o. female who is seen for routine follow-up of management of breast cancer.     Interval History:  She is tolerating abemaciclib well at current dose.  Other than fatigue, she has been doing really well. No fever/chills, no SOB or cough, no nausea/vomiting or abdominal pain. Planning for revision surgery with Dr. Lafayette Dragon mid November.       --Performance status=0    Breast Cancer History  Oncology History Overview Note   31 year old female with recently self palpated right breast lump. A targeted right breast ultrasound on 11/15/2019 showed an irregular mass with spiculated margins at the 7:00 position, 6 cm from the nipple, measuring 1.4 x 1.2 x 1.0 cm. There were 2 abnormal level 1 axillary lymph nodes measuring 1.4 x 1.3 cm and 1.2 x 0.9 cm. Additionally, there was a high level 1/level 2 axillary lymph node, measuring 2.1 x 1.3 cm with an effaced fatty hilum.      Malignant neoplasm of right breast in female, estrogen receptor positive (CMS-HCC)   11/09/2019 Initial Diagnosis    Malignant neoplasm of right breast in female, estrogen receptor positive (CMS-HCC)     11/15/2019 Biopsy    Invasive ductal carcinoma  - Nottingham combined histologic grade: 1  ER 91-100%, PR 91-100%, HER2 negative by IHC    Lymph node, right axilla, core biopsy  - Lymph node positive for metastatic carcinoma,      12/02/2019 -  Cancer Staged    Staging form: Breast, AJCC 8th Edition  - Clinical: Stage IB (cT1c, cN1, cM0, G1, ER+, PR+, HER2-) - Signed by [name], MD on 12/02/2019       12/09/2019 - 03/22/2020 Chemotherapy    OP BREAST AC (Dose Dense) q2W X 4 CYCLES, THEN PACLITAXEL (DOSE DENSE) Q2W x 4 CYCLES  DOXOrubicin 60 mg/m2 IV on day 1, cyclophosphamide 600 mg/m2 IV on day 1, every 2 weeks for 4 cycles, then PACLitaxel 175 mg/m2 every 2 weeks for 4 cycles      04/20/2020 Surgery    Breast, right, simple mastectomy  -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 Extracapsular extension is identified      Breast, left, simple mastectomy  -Benign breast tissue with no atypia, in situ or invasive carcinoma identified     04/20/2020 -  Cancer Staged    Staging form: Breast, AJCC 8th Edition  - Pathologic stage from 04/20/2020: No Stage Recommended (ypT1c, pN1a, cM0, G2, ER+, PR+, HER2-) - Signed by [name], MD on 04/25/2020       05/07/2020 Surgery    Lymph nodes, right axillary, dissection  -One of sixteen lymph nodes involved by metastatic adenocarcinoma (1/16)  -Metastatic focus measures 3 mm in greatest dimension  -No extracapsular extension is identified     06/28/2020 - 08/06/2020 Radiation    The total radiation dose will be 5000 cGy at 200 cGy/fraction for a total of 25 fractions, treated once a day     08/13/2020 Endocrine/Hormone Therapy    Letrozole 2.5mg  +Ovarian suppression (monthly lupron)  Malignant neoplasm of right breast in female, estrogen receptor positive (CMS-HCC)   05/07/2020 Surgery    Lymph nodes, right axillary, dissection  -One of sixteen lymph nodes involved by metastatic adenocarcinoma (1/16)  -Metastatic focus measures 3 mm in greatest dimension  -No extracapsular extension is identified     06/13/2020 -  Radiation    Radiation Therapy Treatment Details (Noted on 06/13/2020)  Site: Right Breast - Overlapping sites  Technique: 3D CRT  Goal: No goal specified  Planned Treatment Start Date: No planned start date specified     06/21/2020 -  Radiation    Radiation Therapy Treatment Details (Noted on 06/21/2020)  Site: Right Breast - Overlapping sites  Technique: 3D CRT  Goal: No goal specified  Planned Treatment Start Date: No planned start date specified         Past Medical History  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    [facility]    Gestational hypertension 01/30/2017    end of pregnancy had hypertension, was induced    Headache     migraine occasionally    Hypertension     during week 37 week, pt induced and had C/S due to hypertension    IIH (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/2015    followed by ophthalmologist    Kidney stone     Malignant neoplasm of right breast in female, 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/30/2019    Pyelonephritis 09/2016    Kidney stones    Urinary tract infection     Had 5-6 in Lifetime, esp high school and college age    Varicella     during childhood    Vitamin B 12 deficiency        Reproductive/GYN History  OB History   Gravida Para Term Preterm AB Living   2 2 2  0 0 2   SAB IAB Ectopic Molar Multiple Live Births   0 0 0 0 0 2      # Outcome Date GA Lbr Len/2nd Weight Sex Delivery Anes PTL Lv   2 Term 02/17/19 [redacted]w[redacted]d  3545 g (7 lb 13 oz) F CS-LTranv Spinal, EPI N LIV      Complications: Failure to Progress in First Stage      Name: Osterhout,CARTER RAE      Apgar1: 8  Apgar5: 9   1 Term 01/30/17 [redacted]w[redacted]d  2875 g (6 lb 5.4 oz) F CS-LTranv Spinal, EPI N LIV      Complications: Hypertension, Breech birth      Name: Nakata,OAKLEY      Apgar1: 8  Apgar5: 9      Obstetric Comments   OB-History reviewed by Lucilla Lame RN on 02/22/2019.   Gardasil series completed   Last pap:  2017; NIL   No abn paps   No hx STI's       Surgical History  Past Surgical History:   Procedure Laterality Date    BREAST BIOPSY Right     IDC    CESAREAN SECTION  01/30/2017    breech    CHEMOTHERAPY      IR INSERT PORT AGE GREATER THAN 5 YRS  12/08/2019    IR INSERT PORT AGE GREATER THAN 5 YRS 12/08/2019 Jobe Gibbon, MD IMG  VIR H&V [facility]    LUMBAR PUNCTURE DIAGNOSTIC [facility] HISTORICAL RESULT) 01/2015    to relieve ICP    PR BX/REMV,LYMPH NODE,DEEP AXILL Right 04/20/2020    Procedure: BX/EXC LYMPH NODE; OPEN, DEEP AXILRY NODE;  Surgeon: Moss Mc, MD;  Location: MAIN OR [facility];  Service: Surgical Oncology Breast    PR CESAREAN DELIVERY ONLY N/A 01/30/2017    Procedure: CESAREAN DELIVERY ONLY;  Surgeon: Asher Muir, MD;  Location: L&D C-SECTION OR SUITES [facility];  Service: Maternal-Fetal Medicine    PR CESAREAN DELIVERY ONLY N/A 02/17/2019    Procedure: CESAREAN DELIVERY ONLY;  Surgeon: Lonny Prude, MD;  Location: L&D C-SECTION OR SUITES [facility];  Service: Maternal-Fetal Medicine    PR IMPLNT BIO IMPLNT FOR SOFT TISSUE REINFORCEMENT Bilateral 04/20/2020    Procedure: IMPLANTATION BIOLOGIC IMPLANT(EG, ACELLULAR DERMAL MATRIX) FOR SOFT TISSUE REINFORCEMENT(EG, BREAST, TRUNK);  Surgeon: Arsenio Katz, MD;  Location: MAIN OR [facility];  Service: Plastics    PR INTRAOPERATIVE SENTINEL LYMPH NODE ID W DYE INJECTION Right 04/20/2020    Procedure: INTRAOPERATIVE IDENTIFICATION SENTINEL LYMPH NODE(S) INCLUDE INJECTION NON-RADIOACTIVE DYE, WHEN PERFORMED;  Surgeon: Moss Mc, MD;  Location: MAIN OR [place];  Service: Surgical Oncology Breast    PR INTRAOPERATIVE SENTINEL LYMPH NODE ID W DYE INJECTION Right 05/07/2020    Procedure: INTRAOPERATIVE IDENTIFICATION SENTINEL LYMPH NODE(S) INCLUDE INJECTION NON-RADIOACTIVE DYE, WHEN PERFORMED;  Surgeon: Moss Mc, MD;  Location: MAIN OR [place];  Service: Surgical Oncology Breast    PR MASTECTOMY, SIMPLE, COMPLETE Bilateral 04/20/2020    Procedure: MASTECTOMY, SIMPLE, COMPLETE;  Surgeon: Moss Mc, MD;  Location: MAIN OR [facility];  Service: Surgical Oncology Breast    PR REMOVE ARMPITS LYMPH NODES COMPLT Right 05/07/2020    Procedure: AXILLARY LYMPHADENECTOMY; COMPLETE;  Surgeon: Moss Mc, MD;  Location: MAIN OR [place];  Service: Surgical Oncology Breast    PR REPLACEMENT TISSUE EXPANDER W/PERMANENT IMPLANT Bilateral 08/13/2021    Procedure: REPLACEMENT OF TISSUE EXPANDER WITH PERMANENT IMPLANT;  Surgeon: Arsenio Katz, MD;  Location: MAIN OR [place];  Service: Plastics    PR TISSUE EXPANDER PLACEMENT BREAST RECONSTRUCTION Bilateral 04/20/2020    Procedure: TISSUE EXPANDER PLACEMENT IN BREAST RECONSTRUCTION, INCLUDING SUBSEQUENT EXPANSION(S);  Surgeon: Arsenio Katz, MD;  Location: MAIN OR [place];  Service: Plastics    TONSILECTOMY, ADENOIDECTOMY, BILATERAL MYRINGOTOMY AND TUBES      WISDOM TOOTH EXTRACTION  2004       Medications    Current Outpatient Medications:     abemaciclib (VERZENIO) 100 mg Tab tablet, Take 1 tablet (100 mg total) by mouth Two (2) times a day. Take with or without food., Disp: 56 tablet, Rfl: 5    acetaminophen (TYLENOL) 325 MG tablet, Take 2 tablets (650 mg total) by mouth every six (6) hours as needed for pain., Disp: , Rfl:     calcium carbonate (TUMS) 200 mg calcium (500 mg) chewable tablet, Chew 1 tablet (200 mg of elem calcium total) daily., Disp: , Rfl:     cetirizine (ZYRTEC) 10 MG tablet, Take 1 tablet (10 mg total) by mouth daily., Disp: 30 tablet, Rfl: 2    cetirizine (ZYRTEC) 5 MG chewable tablet, Chew 1 tablet (5 mg total) daily., Disp: , Rfl:     cholecalciferol, vitamin D3-25 mcg, 1,000 unit,, 25 mcg (1,000 unit) capsule, Take 1 capsule (25 mcg total) by mouth., Disp: , Rfl:     cyanocobalamin 100 MCG tablet, Take 1 tablet (100 mcg total) by mouth daily., Disp: ,  Rfl:     cyanocobalamin, vitamin B-12, 1000 MCG tablet, Take 1 tablet (1,000 mcg total) by mouth daily., Disp: , Rfl:     docusate sodium (COLACE) 100 MG capsule, Take 1 capsule (100 mg total) by mouth nightly as needed (PRN)., Disp: , Rfl:     escitalopram oxalate (LEXAPRO) 20 MG tablet, Take 1 tablet (20 mg total) by mouth daily., Disp: 90 tablet, Rfl: 3    hydroquinone 4 % cream, APPLY CREAM TOPICALLY TWICE DAILY UP TO 3 MONTHS, Disp: , Rfl:     letrozole (FEMARA) 2.5 mg tablet, Take 1 tablet (2.5 mg total) by mouth daily., Disp: 90 tablet, Rfl: 3    leuprolide (LUPRON) 3.75 mg injection, Inject 3.75 mg into the muscle., Disp: , Rfl:     omeprazole (PRILOSEC) 20 MG capsule, Take 1 capsule by mouth once daily, Disp: 90 capsule, Rfl: 0    omeprazole (PRILOSEC) 20 MG capsule, Take 1 capsule (20 mg total) by mouth daily., Disp: 90 capsule, Rfl: 0    ondansetron (ZOFRAN-ODT) 4 MG disintegrating tablet, DISSOLVE 1 TABLET IN MOUTH EVERY 8 HOURS AS NEEDED FOR NAUSEA, Disp: 18 tablet, Rfl: 0    rimegepant (NURTEC ODT) 75 mg TbDL, Take 1 tablet (75 mg total) by mouth., Disp: , Rfl:     rimegepant (NURTEC ODT) 75 mg TbDL, Take 1 tablet (75 mg total) by mouth daily as needed., Disp: , Rfl:     SUMAtriptan (IMITREX) 100 MG tablet, TAKE ONE TABLET BY MOUTH AT ONSET OF MIGRAINE FOR UP TO 1 DOSE . IF SYMPTOMS PERSIST A SECOND DOSE MAY BE TAKEN IN 2 HOURS IF NEEDED, Disp: , Rfl:     SUMAtriptan (IMITREX) 25 MG tablet, Take 1 tablet (25 mg total) by mouth., Disp: , Rfl:     topiramate (TOPAMAX) 25 MG tablet, TAKE 3 TABLETS BY MOUTH ONCE DAILY, Disp: , Rfl:     traZODone (DESYREL) 50 MG tablet, TAKE 1/2 TO 1 (ONE-HALF TO ONE) TABLET BY MOUTH NIGHTLY AS NEEDED FOR SLEEP, Disp: 30 tablet, Rfl: 0    vitamin E-134 mg, 200 UNIT, 134 mg (200 UNIT) capsule, Take 1 capsule (134 mg total) by mouth daily., Disp: , Rfl:     WEGOVY 1.7 MG/0.75 ML SUBCUTANEOUS PEN INJECTOR, Inject 0.75 mL (1.7 mg total) under the skin every seven (7) days., Disp: 3 mL, Rfl: 3  No current facility-administered medications for this visit.    Facility-Administered Medications Ordered in Other Visits:     leuprolide (LUPRON) 3.75 mg injection, , , ,     Allergies  No Known Allergies    Personal and Social History  Social History     Social History Narrative    PRECONCEPTUAL ASSESSMENT:        Tuere is a 31 y.o. Caucasian female    Caffeine:denies use    Cats: no    Exercise: not active    Diet: balanced    Family hx of of birth defects, chromosomal abnormality, intellectual disability, developmental delay: no.            PARTNER HISTORY:     Domingo Dimes is a 16 y.o.  Caucasian female.    Occupation:  Immunologist; [employer]    He has fathered children:  no    He is healthy with no chronic illness    Family history of  birth defects, chromosomal abnormality, intellectual disability, or developmental delay: no.  Family History  Family History   Problem Relation Age of Onset    Cancer Maternal Aunt 64        breast Ca    Hypertension Father     Other Father 27        Benign brain tumor    Hypothyroidism Mother     Cancer Paternal Aunt         Breast Ca dx in 42s    ALS Maternal Aunt     Ovarian cancer Maternal Grandmother 57    No Known Problems Maternal Grandfather     Cancer Paternal Grandmother         Unknown Cancer    No Known Problems Paternal Grandfather     No Known Problems Maternal Aunt     No Known Problems Maternal Aunt     Breast cancer Maternal Cousin         Dx in 96s    No Known Problems Paternal Aunt     No Known Problems Daughter     No Known Problems Daughter     No Known Problems Other     BRCA 1/2 Neg Hx     Colon cancer Neg Hx     Endometrial cancer Neg Hx          Review of Systems: A 12-system review of systems was obtained including: Constitutional, Eyes, ENT, Cardiovascular, Respiratory, GI, GU, Musculoskeletal, Skin, Neurological, Psychiatric, Endocrine, Heme/Lymphatic, and Allergic/Immunologic systems. It is negative or non-contributory to the patient???s management except for the following: See Interval History.    Physical Examination:  Vital Signs: There were no vitals taken for this visit.  General:  Healthy-appearing female in no acute distress..  Cardiovasc:  No heaves, regular, no additional sounds. No lower extremity edema.    Respiratory:  Chest clear to percussion and auscultation, unlabored  Gastrointestinal:  Soft, nontender, no hepatomegaly.   Musculoskeletal:  No bony pain or tenderness.   Skin and Subcutaneous Tissues: No significant rash.  Psychiatric: Mood is normal.  No other symptoms.   Neuro:  Alert and oriented.  Gait and coordination normal  Breasts: Bilateral mastectomies with implants in place. Well healed scars.  Heme/Lymphatic/Immunologic: No supraclavicular or cervical adenopathy    I have personally reviewed the following diagnostic studies:    Breast imaging and pathology reviewed. See Results for details

## 2022-03-26 NOTE — Unmapped (Unsigned)
Labs drawn via butterfly & sent for analysis.  To next appt.  Care provided by A S.

## 2022-03-27 ENCOUNTER — Ambulatory Visit: Admit: 2022-03-27 | Discharge: 2022-03-28 | Payer: PRIVATE HEALTH INSURANCE

## 2022-03-27 NOTE — Unmapped (Signed)
Thank you for choosing [facility] for your prosthetic / orthotic needs.       Contact information:   The best method of contact is through mychart.  Using mychart, you can message your clinical team at any time and receive direct communications back to you.     To schedule a visit or for after hours care over the phone please contact:    [phone]     Making P&O devices can take time.  Your clinician will try to provide you with our best estimate of how long the process should take and if possible make an appointment for you to return.  Appointments help to set deadlines for production, but sometimes these need to be moved because of production delays.      In general the process follows these steps.   You meet with a clinician who recommends a device meant just for you   The clinician takes measurements or makes a detailed copy of your body to make your device   Our authorization team works on getting permission from your insurer to make you a device.     Our finance team lets you know how much you would be expected to pay and you choose to move forward.   Your device is ordered and fabrication begins   Your device is completed and arrives at our location   You return to be fit with your device    Here are some approximate timelines for common devices once production is approved to start  Device Expected timeline Visits Device Expected Timeline Visits   Scoliosis Brace 4 weeks 2 visits  +1 for in-brace x-ray Preparatory Prosthesis 2-3 weeks 2 visits   Cranial Remolding Orthosis 2 weeks 8 visits over 15 weeks Final Prosthesis 6 weeks 4-5 visits   AFO 3-4 weeks 2 visits Activity Specific Prosthesis 6-8 weeks 5-6 visits   Pre-made supplies, braces, etc 1 week 1-2 visits Aesthetic Restoration Prosthesis 8-12 weeks 2-3 visits     Payments:  [facility] has a team of experts who will estimate how much you may owe for your orthosis.  If payment is due for a custom or special-order orthosis, a deposit will be required in order to begin work on the device.  Full payment will be expected at the time of delivery.  Payment plans can be arranged if that is helpful for you.  If you have questions about payments due you can contact the financial navigator team at [phone].          For some patients, it is not convenient to be seen in one of our locations.  If this is the case for you, please discuss it with your clinician who can coordinate your care with well trained clinical teams in your area.      We are committed to working to improve every day.  If you have feedback regarding our process or performance please contact the office manager at the site where you were seen or call the main number [phone].  Alternatively, you can send an email to [email or web address].  We are committed to confidentially addressing your feedback and resolving any issues in a timely manner.  We can receive faxes at [phone].

## 2022-03-27 NOTE — Unmapped (Signed)
[facility] THERAPY SERVICES [place]  OUTPATIENT PHYSICAL THERAPY  03/27/2022          Patient Name: Tami Gibson  Date of Birth:05-28-1991  Diagnosis:   Encounter Diagnoses   Name Primary?    Lymphedema syndrome, postmastectomy Yes    Malignant neoplasm of right breast in female, estrogen receptor positive, unspecified site of breast (CMS-HCC)      Referring MD:  [name], MD    Date of Onset of Impairment-No date available  Date PT Care Plan Established or Reviewed-No date available  Date PT Treatment Started-No date available   Plan of Care Effective Date:     Oncology History Overview Note   31 year old female with recently self palpated right breast lump. A targeted right breast ultrasound on 11/15/2019 showed an irregular mass with spiculated margins at the 7:00 position, 6 cm from the nipple, measuring 1.4 x 1.2 x 1.0 cm. There were 2 abnormal level 1 axillary lymph nodes measuring 1.4 x 1.3 cm and 1.2 x 0.9 cm. Additionally, there was a high level 1/level 2 axillary lymph node, measuring 2.1 x 1.3 cm with an effaced fatty hilum.      Malignant neoplasm of right breast in female, estrogen receptor positive (CMS-HCC)   11/09/2019 Initial Diagnosis    Malignant neoplasm of right breast in female, estrogen receptor positive (CMS-HCC)     11/15/2019 Biopsy    Invasive ductal carcinoma  - Nottingham combined histologic grade: 1  ER 91-100%, PR 91-100%, HER2 negative by IHC    Lymph node, right axilla, core biopsy  - Lymph node positive for metastatic carcinoma,      12/02/2019 -  Cancer Staged    Staging form: Breast, AJCC 8th Edition  - Clinical: Stage IB (cT1c, cN1, cM0, G1, ER+, PR+, HER2-) - Signed by [name], MD on 12/02/2019       12/09/2019 - 03/22/2020 Chemotherapy    OP BREAST AC (Dose Dense) q2W X 4 CYCLES, THEN PACLITAXEL (DOSE DENSE) Q2W x 4 CYCLES  DOXOrubicin 60 mg/m2 IV on day 1, cyclophosphamide 600 mg/m2 IV on day 1, every 2 weeks for 4 cycles, then PACLitaxel 175 mg/m2 every 2 weeks for 4 cycles      04/20/2020 Surgery    Breast, right, simple mastectomy  -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 Extracapsular extension is identified      Breast, left, simple mastectomy  -Benign breast tissue with no atypia, in situ or invasive carcinoma identified     04/20/2020 -  Cancer Staged    Staging form: Breast, AJCC 8th Edition  - Pathologic stage from 04/20/2020: No Stage Recommended (ypT1c, pN1a, cM0, G2, ER+, PR+, HER2-) - Signed by [name], MD on 04/25/2020       05/07/2020 Surgery    Lymph nodes, right axillary, dissection  -One of sixteen lymph nodes involved by metastatic adenocarcinoma (1/16)  -Metastatic focus measures 3 mm in greatest dimension  -No extracapsular extension is identified     06/28/2020 - 08/06/2020 Radiation    The total radiation dose will be 5000 cGy at 200 cGy/fraction for a total of 25 fractions, treated once a day     08/13/2020 Endocrine/Hormone Therapy    Letrozole 2.5mg  +Ovarian suppression (monthly lupron)     Malignant neoplasm of right breast in female, estrogen receptor positive (CMS-HCC)  05/07/2020 Surgery    Lymph nodes, right axillary, dissection  -One of sixteen lymph nodes involved by metastatic adenocarcinoma (1/16)  -Metastatic focus measures 3 mm in greatest dimension  -No extracapsular extension is identified     06/13/2020 -  Radiation    Radiation Therapy Treatment Details (Noted on 06/13/2020)  Site: Right Breast - Overlapping sites  Technique: 3D CRT  Goal: No goal specified  Planned Treatment Start Date: No planned start date specified     06/21/2020 -  Radiation    Radiation Therapy Treatment Details (Noted on 06/21/2020)  Site: Right Breast - Overlapping sites  Technique: 3D CRT  Goal: No goal specified  Planned Treatment Start Date: No planned start date specified Assessment/Plan:    Assessment  Assessment details:    Patient tolerated manual lymphatic drainage well with no complaint during or after treatment. Patient verbalizes understanding of self-manual lymphatic drainage technique as well as appropriate use of garment and pump.  31 y.o. female presents with Stage I lymphedema secondary to breast cancer treatment.  In addition to lymphedema , the patient is experiencing these side effects from cancer treatments: Myofascial/scar restrictions post op  Radiation: Fibrosis/myofascial restrictions  Currently the right limb is 17 % bigger than the left limb.   Patient requires skilled Physical Therapy services  for the following problem list and secondary functional limitations:    Problem List:   Lack of knowledge of lymphedema/edema self care  Uncontrolled swelling and/or lack of appropriate compression garment    Secondary Functional Limitations:  Decreased knowledge of self care  of lymphedema/edema puts patient at risk for increased swelling and infection  Uncontrolled swelling can increase chances of  infection, and limit functional ROM             Impairments: postural weakness, increased edema, impaired sensation, muscular restrictions, uncontrolled swelling, impaired ADLs, decreased skin integrity and decreased knowledge of self-care                Prognosis: excellent prognosis    Positive Prognosis Rationale: motivated for treatment, caregiver/family support, chronicity of condition, severity of symptoms, response to trial tx and previous tx with benefit.    Barriers to therapy: none identified    Therapy Goals      Goals:      Short Term Goals to be addressed in 4 weeks:  1. Pt will be independent with home stretching, ROM HEP to allow progression towards long-term goals, potentially increased comfort, ROM.   2. Pt will participate in MLD, be educated in self MLD to promote improved fluid transport, potentially decreased R breast and/or UE edema.  3. Pt will be able to articulate skin care recommendations, as well as signs/symptoms of infection, to promote long-term skin health, infection prevention, long-term maintenance of condition.    Long-Term Goals to be addressed in 8 weeks:  1. Pt will receive assistance with compression garment fitting and procurement, allowing potentially increased comfort, long-term maintenance of condition.   2. Patient to demonstrate reduction in R UE limb volume to no more than 12% larger than L UE.      Plan    Therapy options: will be seen for skilled physical therapy services    Planned therapy interventions: Body Mechanics Training, Compression Bandaging, Compression Pump, Diaphragmatic/Pursed-lip Breathing, Dry Needling, E-Stim, Education Therapist, art, Education - Patient, Endurance Activites, Functional Mobility, Garment Measurement, Home Exercise Program, Manual Lymph Drainage, Manual Therapy, Neuromuscular Re-education, Postural Training, Self-Care/Home Training, Taping, TENS, Therapeutic Activities and Therapeutic Exercises  Frequency: 1x week    Duration in weeks: 8 weeks    Education provided to: patient.    Education provided: Equipment recommendations, HEP, Garment options, Manual lymph drainage and Treatment options and plan    Education results: needs reinforcement and needs further instruction.      Next visit plan:        MLD    Total Session Time: 55    Treatment rendered today:      ______________________________________________________________________    Manual Therapy x 55'  MLD, including   Short neck  L axillary  R inguinal  R to L anterior A-A anastamosis  R A-I anastamosis  Posterior A-A anastamosis  R UE  R breast   -kinesiotape to ant and post A-A anastomosis, R elbow to shoulder      ______________________________________________________________________            Subjective:   History of Present Condition  Date of Surgery: 05/07/2020 ([facility])    History of Present Condition/Chief Complaint:       R UE lymphedema    Subjective:     Patient states that she kept the tape on for a couple of days and tolerated it well. It seems like her hand is more swollen today than it was last time, she did have some alcohol since last visit so wondering if that could be playing a role. Feels like her implant has shifted up, feels really tight in her axilla.     Therapist wore a mask for the entire session.     I reviewed the no-show/attendance policy with the patient and caregiver(s). The patient and/or family is aware that they must call to cancel appointments more than 24 hours in advance. They are also aware that if they late cancel (less than 24 hours from appointment), arrives greater than 15 minutes late, or no-show three times, we reserve the right to cancel their remaining appointments. This policy is in place to allow [place] to best serve the needs of our caseload.      Quality of life: good    Pain  Current pain rating: 0  At best pain rating: 0  At worst pain rating: 4  Location: R arm above elbow      Precautions and Equipment  Precautions: Cancer history  Prior Functional Status:     Functional Limitation(s)-No physical limitations  Current functional status: limited recreation and limited exercise        Treatments  Previous treatment: physical therapy      Patient Goals  Patient goals for therapy: decreased edema  Patient goal: manage symptoms before surgery      Objective:   Lymphedema                       Inital values taken: 02/28/2022  9:01 AM   Involved arm: Right   Dominant arm: Right   Patient position: Seated   Total volume: right 4355.41 mL 02/28/2022  9:01 AM   Total volume: left 3720.41 mL 02/28/2022  9:01 AM   L/R difference +635.00 mL +17.07 %          Inital values taken: 02/28/2022  9:01 AM   Involved arm: Right   Dominant arm: Right   Patient position: Seated   Total volume: right 4355.41 mL 02/28/2022  9:01 AM   Total volume: left 3720.41 mL 02/28/2022  9:01 AM   L/R difference +635.00 mL +17.07 % I attest that I have reviewed  the above information.  Signed: Edwena Bunde, PT  03/27/2022 7:41 AM

## 2022-03-27 NOTE — Unmapped (Signed)
[facility] [place]  Prosthetics & Orthotics  [phone]       Lymphedema Initial Evaluation    HPI   SUBJECTIVE:   Tami Gibson is a 31 y.o. who presents for evaluation of RUE compression arm sleeve, gloves, and nighttime TributeNight (low profile and pull on loops). Interested in the low profile for thinner material as they experience hot flashes at night. Pull on loops to assist with donning. Scheduled for reconstruction surgery in November. Old arm sleeve garments are worn and top started to come apart.    Currently in lymphedema therapy: yes working with [employer]    History     Current Outpatient Medications on File Prior to Visit   Medication Sig Dispense Refill    abemaciclib (VERZENIO) 100 mg Tab tablet Take 1 tablet (100 mg total) by mouth Two (2) times a day. Take with or without food. 56 tablet 5    acetaminophen (TYLENOL) 325 MG tablet Take 2 tablets (650 mg total) by mouth every six (6) hours as needed for pain.      calcium carbonate (TUMS) 200 mg calcium (500 mg) chewable tablet Chew 1 tablet (200 mg of elem calcium total) daily.      cetirizine (ZYRTEC) 10 MG tablet Take 1 tablet (10 mg total) by mouth daily. 30 tablet 2    cetirizine (ZYRTEC) 5 MG chewable tablet Chew 1 tablet (5 mg total) daily.      cholecalciferol, vitamin D3-25 mcg, 1,000 unit,, 25 mcg (1,000 unit) capsule Take 1 capsule (25 mcg total) by mouth.      cyanocobalamin 100 MCG tablet Take 1 tablet (100 mcg total) by mouth daily.      cyanocobalamin, vitamin B-12, 1000 MCG tablet Take 1 tablet (1,000 mcg total) by mouth daily.      docusate sodium (COLACE) 100 MG capsule Take 1 capsule (100 mg total) by mouth nightly as needed (PRN).      escitalopram oxalate (LEXAPRO) 20 MG tablet Take 1 tablet (20 mg total) by mouth daily. 90 tablet 3    hydroquinone 4 % cream APPLY CREAM TOPICALLY TWICE DAILY UP TO 3 MONTHS      letrozole (FEMARA) 2.5 mg tablet Take 1 tablet (2.5 mg total) by mouth daily. 90 tablet 3    leuprolide (LUPRON) 3.75 mg injection Inject 3.75 mg into the muscle.      omeprazole (PRILOSEC) 20 MG capsule Take 1 capsule by mouth once daily 90 capsule 0    omeprazole (PRILOSEC) 20 MG capsule Take 1 capsule (20 mg total) by mouth daily. 90 capsule 0    ondansetron (ZOFRAN-ODT) 4 MG disintegrating tablet DISSOLVE 1 TABLET IN MOUTH EVERY 8 HOURS AS NEEDED FOR NAUSEA 18 tablet 0    rimegepant (NURTEC ODT) 75 mg TbDL Take 1 tablet (75 mg total) by mouth.      rimegepant (NURTEC ODT) 75 mg TbDL Take 1 tablet (75 mg total) by mouth daily as needed.      SUMAtriptan (IMITREX) 100 MG tablet TAKE ONE TABLET BY MOUTH AT ONSET OF MIGRAINE FOR UP TO 1 DOSE . IF SYMPTOMS PERSIST A SECOND DOSE MAY BE TAKEN IN 2 HOURS IF NEEDED      SUMAtriptan (IMITREX) 25 MG tablet Take 1 tablet (25 mg total) by mouth.      topiramate (TOPAMAX) 25 MG tablet TAKE 3 TABLETS BY MOUTH ONCE DAILY      traZODone (DESYREL) 50 MG tablet TAKE 1/2 TO 1 (ONE-HALF TO ONE) TABLET BY  MOUTH NIGHTLY AS NEEDED FOR SLEEP 30 tablet 0    vitamin E-134 mg, 200 UNIT, 134 mg (200 UNIT) capsule Take 1 capsule (134 mg total) by mouth daily.      WEGOVY 1.7 MG/0.75 ML SUBCUTANEOUS PEN INJECTOR Inject 0.75 mL (1.7 mg total) under the skin every seven (7) days. 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    [facility]    Gestational hypertension 01/30/2017    end of pregnancy had hypertension, was induced    Headache     migraine occasionally    Hypertension     during week 37 week, pt induced and had C/S due to hypertension    IIH (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/2015    followed by ophthalmologist    Kidney stone     Malignant neoplasm of right breast in female, 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/30/2019    Pyelonephritis 09/2016    Kidney stones    Urinary tract infection     Had 5-6 in Lifetime, esp high school and college age    Varicella     during childhood    Vitamin B 12 deficiency        Past Surgical History:   Procedure Laterality Date    BREAST BIOPSY Right     IDC    CESAREAN SECTION  01/30/2017    breech    CHEMOTHERAPY      IR INSERT PORT AGE GREATER THAN 5 YRS  12/08/2019    IR INSERT PORT AGE GREATER THAN 5 YRS 12/08/2019 Jobe Gibbon, MD IMG VIR [facility]    LUMBAR PUNCTURE DIAGNOSTIC [facility] HISTORICAL RESULT)  01/2015    to relieve ICP    PR BX/REMV,LYMPH NODE,DEEP AXILL Right 04/20/2020    Procedure: BX/EXC LYMPH NODE; OPEN, DEEP AXILRY NODE;  Surgeon: Moss Mc, MD;  Location: MAIN OR [facility];  Service: Surgical Oncology Breast    PR CESAREAN DELIVERY ONLY N/A 01/30/2017    Procedure: CESAREAN DELIVERY ONLY;  Surgeon: Asher Muir, MD;  Location: L&D C-SECTION OR SUITES [facility];  Service: Maternal-Fetal Medicine    PR CESAREAN DELIVERY ONLY N/A 02/17/2019    Procedure: CESAREAN DELIVERY ONLY;  Surgeon: Lonny Prude, MD;  Location: L&D C-SECTION OR SUITES [facility];  Service: Maternal-Fetal Medicine    PR IMPLNT BIO IMPLNT FOR SOFT TISSUE REINFORCEMENT Bilateral 04/20/2020    Procedure: IMPLANTATION BIOLOGIC IMPLANT(EG, ACELLULAR DERMAL MATRIX) FOR SOFT TISSUE REINFORCEMENT(EG, BREAST, TRUNK);  Surgeon: Arsenio Katz, MD;  Location: MAIN OR [facility];  Service: Plastics    PR INTRAOPERATIVE SENTINEL LYMPH NODE ID W DYE INJECTION Right 04/20/2020    Procedure: INTRAOPERATIVE IDENTIFICATION SENTINEL LYMPH NODE(S) INCLUDE INJECTION NON-RADIOACTIVE DYE, WHEN PERFORMED;  Surgeon: Moss Mc, MD;  Location: MAIN OR [facility];  Service: Surgical Oncology  Breast    PR INTRAOPERATIVE SENTINEL LYMPH NODE ID W DYE INJECTION Right 05/07/2020    Procedure: INTRAOPERATIVE IDENTIFICATION SENTINEL LYMPH NODE(S) INCLUDE INJECTION NON-RADIOACTIVE DYE, WHEN PERFORMED;  Surgeon: Moss Mc, MD;  Location: MAIN OR [facility];  Service: Surgical Oncology Breast    PR MASTECTOMY, SIMPLE, COMPLETE Bilateral 04/20/2020    Procedure: MASTECTOMY, SIMPLE, COMPLETE;  Surgeon: Moss Mc, MD;  Location: MAIN OR [facility];  Service: Surgical Oncology Breast    PR REMOVE ARMPITS LYMPH NODES COMPLT Right 05/07/2020    Procedure: AXILLARY LYMPHADENECTOMY; COMPLETE;  Surgeon: Moss Mc, MD;  Location: [facility] OR [facility];  Service: Surgical Oncology Breast    PR REPLACEMENT TISSUE EXPANDER W/PERMANENT IMPLANT Bilateral 08/13/2021    Procedure: REPLACEMENT OF TISSUE EXPANDER WITH PERMANENT IMPLANT;  Surgeon: Arsenio Katz, MD;  Location: MAIN OR [place];  Service: Plastics    PR TISSUE EXPANDER PLACEMENT BREAST RECONSTRUCTION Bilateral 04/20/2020    Procedure: TISSUE EXPANDER PLACEMENT IN BREAST RECONSTRUCTION, INCLUDING SUBSEQUENT EXPANSION(S);  Surgeon: Arsenio Katz, MD;  Location: MAIN OR [place];  Service: Plastics    TONSILECTOMY, ADENOIDECTOMY, BILATERAL MYRINGOTOMY AND TUBES      WISDOM TOOTH EXTRACTION  2004       Family History   Problem Relation Age of Onset    Cancer Maternal Aunt 45        breast Ca    Hypertension Father     Other Father 63        Benign brain tumor    Hypothyroidism Mother     Cancer Paternal Aunt         Breast Ca dx in 30s    ALS Maternal Aunt     Ovarian cancer Maternal Grandmother 54    No Known Problems Maternal Grandfather     Cancer Paternal Grandmother         Unknown Cancer    No Known Problems Paternal Grandfather     No Known Problems Maternal Aunt     No Known Problems Maternal Aunt     Breast cancer Maternal Cousin         Dx in 60s No Known Problems Paternal Aunt     No Known Problems Daughter     No Known Problems Daughter     No Known Problems Other     BRCA 1/2 Neg Hx     Colon cancer Neg Hx     Endometrial cancer Neg Hx        Social History     Tobacco Use    Smoking status: Never    Smokeless tobacco: Never   Vaping Use    Vaping Use: Never used   Substance Use Topics    Alcohol use: Not Currently    Drug use: No       History of Compression Garments:  Jobst Bella Strong, 20-29mmHg, size 7, silicone, blk, long, compression arm sleeve received 05/30/21  Nighttime garment received spring 2022 - reports it goes from axilla to just distal to MCPs    Physical Exam     Appearance: Alert, awake, and ambulating. Taping on RUE from lymphedema therapy.    Skin condition: warm, dry, no rashes or lesions noted    Location of swelling: RUE    Sensation: intact    Pain:       Upper Extremity Measurements  Level Circumference (cm)  Length (cm)    Right  Right   MCP 19 Wrist to end of middle finger and  PIP 18.7 / 14   Wrist 17 Wrist to MCP 11.3   Mid forearm 27.2 Wrist to thumb web space 8.5   Widest forearm 31.2 Wrist-widest forearm 19   Elbow 30 Wrist-elbow 25.5   Widest upper arm 42 Wrist-widest upper arm 42   Axillary cavity 41 Wrist-axillary cavity 50.8         Actions taken today     Assessment and Shape Capture: measurements     Garment Number:  2    Design:   [x]  Custom - nighttime  [x]  Prefabricated - arm sleeve and glove    Lower Extremity  []  Below Knee length   []  Thigh length    []  Open Toe    []  Closed Toe    []  Straight Toe    []  Slant Toe    []  2.5 cm Silicone Band  []  5 cm Silicone Band    Upper Extremity  [x]  Arm sleeve   [x]  Glove  []  Gauntlet  [x]  TributeNight - UE-BG    Compression level  []  15-20 mmHg  []  18-21 mmHg  []  20-30 mmHg  []  23-32 mmHg  [x]  30-40 mmHg    Need for Custom: [x]  Yes []  No Custom nighttime   Mark all that apply      Has used prefabricated garments unsuccessfully in the past [x]  Yes []  No Difficulties with fit/comfort with current night-time garment   Condition expected to be long-standing in nature [x]  Yes []  No      Modifications expected to custom or prefabricated device   Size  []     Alignment []     Other []       Any additions or modifications to the standard:     Chosen color for night garment - raspberry  Day time garment color - beige/black    Goals     Short Term:  Reduce swelling in RUE    Wear device full time  Long Term:  Improve extremity blood flow  Reduce chance of blood clots  Reduce and prevent future swelling  Patient Goals:  Reduce pain and swelling  Manage lymphedema      Assessment   Assessment: Tami Gibson was seen for measurements of RUE arm sleeve, glove, and night-time arm compression garment. Measurements were taken today. Compression garments are recommended to provide gradient pressure patterns to reduce swelling in affected body parts. Tami Gibson has used day-time and night-time garments before.     Counseled patient about device recommendations, timelines, prescription, authorization process and follow up.  Patient expressed understanding    [x]  New Patient Info provided   []  Medicare supplier standards provided  Plan    Return Visit:  will call when garments arrive - patient prefers [facility] location*     Authorization expectations:  insurance V&A    Ordering: Patient to be notified of and in agreement to FR prior to ordering  CUSTOM ORDERS ARE WAITING ON AUTHORIZATION prior to ordering    O&P Fabrication:     Jobst bella strong arm sleeve    Mediven harmony seamless glove 30-40    TributeNight arm garment     O&P Supplies: Yes      The follow Supplies are needed for next visit:     Item Quantity   Jobst Bella Strong, size 7 long, 30-40 2   Mediven Harmony seamless glove 30-40, size 4 2

## 2022-04-01 ENCOUNTER — Ambulatory Visit
Admit: 2022-04-01 | Payer: PRIVATE HEALTH INSURANCE | Attending: Rehabilitative and Restorative Service Providers" | Primary: Rehabilitative and Restorative Service Providers"

## 2022-04-01 ENCOUNTER — Ambulatory Visit
Admit: 2022-04-01 | Discharge: 2022-04-28 | Payer: PRIVATE HEALTH INSURANCE | Attending: Rehabilitative and Restorative Service Providers" | Primary: Rehabilitative and Restorative Service Providers"

## 2022-04-02 NOTE — Unmapped (Signed)
[facility]ER[facility]   OUTPATIENT PHYSICAL THERAPY  04/01/2022  Note Type: Treatment Note       Patient Name: Tami Gibson  Date of Birth:16-Nov-1990  Diagnosis:   Encounter Diagnoses   Name Primary?    Lymphedema syndrome, postmastectomy Yes    Malignant neoplasm of right breast in female, estrogen receptor positive, unspecified site of breast (CMS-HCC)     Scar condition and fibrosis of skin      Referring MD:  [name], MD    Date of Onset of Impairment-No date available  Date PT Care Plan Established or Reviewed-No date available  Date PT Treatment Started-No date available   Plan of Care Effective Date:     Oncology History Overview Note   31 year old female with recently self palpated right breast lump. A targeted right breast ultrasound on 11/15/2019 showed an irregular mass with spiculated margins at the 7:00 position, 6 cm from the nipple, measuring 1.4 x 1.2 x 1.0 cm. There were 2 abnormal level 1 axillary lymph nodes measuring 1.4 x 1.3 cm and 1.2 x 0.9 cm. Additionally, there was a high level 1/level 2 axillary lymph node, measuring 2.1 x 1.3 cm with an effaced fatty hilum.      Malignant neoplasm of right breast in female, estrogen receptor positive (CMS-HCC)   11/09/2019 Initial Diagnosis    Malignant neoplasm of right breast in female, estrogen receptor positive (CMS-HCC)     11/15/2019 Biopsy    Invasive ductal carcinoma  - Nottingham combined histologic grade: 1  ER 91-100%, PR 91-100%, HER2 negative by IHC    Lymph node, right axilla, core biopsy  - Lymph node positive for metastatic carcinoma,      12/02/2019 -  Cancer Staged    Staging form: Breast, AJCC 8th Edition  - Clinical: Stage IB (cT1c, cN1, cM0, G1, ER+, PR+, HER2-) - Signed by [name], MD on 12/02/2019       12/09/2019 - 03/22/2020 Chemotherapy    OP BREAST AC (Dose Dense) q2W X 4 CYCLES, THEN PACLITAXEL (DOSE DENSE) Q2W x 4 CYCLES  DOXOrubicin 60 mg/m2 IV on day 1, cyclophosphamide 600 mg/m2 IV on day 1, every 2 weeks for 4 cycles, then PACLitaxel 175 mg/m2 every 2 weeks for 4 cycles      04/20/2020 Surgery    Breast, right, simple mastectomy  -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 Extracapsular extension is identified      Breast, left, simple mastectomy  -Benign breast tissue with no atypia, in situ or invasive carcinoma identified     04/20/2020 -  Cancer Staged    Staging form: Breast, AJCC 8th Edition  - Pathologic stage from 04/20/2020: No Stage Recommended (ypT1c, pN1a, cM0, G2, ER+, PR+, HER2-) - Signed by [name], MD on 04/25/2020       05/07/2020 Surgery    Lymph nodes, right axillary, dissection  -One of sixteen lymph nodes involved by metastatic adenocarcinoma (1/16)  -Metastatic focus measures 3 mm in greatest dimension  -No extracapsular extension is identified     06/28/2020 - 08/06/2020 Radiation    The total radiation dose will be 5000 cGy at 200 cGy/fraction for a total of 25 fractions, treated once a day     08/13/2020 Endocrine/Hormone Therapy    Letrozole 2.5mg  +Ovarian suppression (monthly lupron)  Malignant neoplasm of right breast in female, estrogen receptor positive (CMS-HCC)   05/07/2020 Surgery    Lymph nodes, right axillary, dissection  -One of sixteen lymph nodes involved by metastatic adenocarcinoma (1/16)  -Metastatic focus measures 3 mm in greatest dimension  -No extracapsular extension is identified     06/13/2020 -  Radiation    Radiation Therapy Treatment Details (Noted on 06/13/2020)  Site: Right Breast - Overlapping sites  Technique: 3D CRT  Goal: No goal specified  Planned Treatment Start Date: No planned start date specified     06/21/2020 -  Radiation    Radiation Therapy Treatment Details (Noted on 06/21/2020)  Site: Right Breast - Overlapping sites  Technique: 3D CRT  Goal: No goal specified  Planned Treatment Start Date: No planned start date specified           Assessment/Plan:    Assessment  Assessment details:    Patient tolerated manual lymphatic drainage well with no complaint during or after treatment. Patient verbalizes understanding of self-manual lymphatic drainage technique as well as appropriate use of garment and pump.  31 y.o. female presents with Stage I lymphedema secondary to breast cancer treatment.  In addition to lymphedema , the patient is experiencing these side effects from cancer treatments: Myofascial/scar restrictions post op  Radiation: Fibrosis/myofascial restrictions  Currently the right limb is 17 % bigger than the left limb.   Patient requires skilled Physical Therapy services  for the following problem list and secondary functional limitations:    Problem List:   Lack of knowledge of lymphedema/edema self care  Uncontrolled swelling and/or lack of appropriate compression garment    Secondary Functional Limitations:  Decreased knowledge of self care  of lymphedema/edema puts patient at risk for increased swelling and infection  Uncontrolled swelling can increase chances of  infection, and limit functional ROM             Impairments: postural weakness, increased edema, impaired sensation, muscular restrictions, uncontrolled swelling, impaired ADLs, decreased skin integrity and decreased knowledge of self-care                Prognosis: excellent prognosis    Positive Prognosis Rationale: motivated for treatment, caregiver/family support, chronicity of condition, severity of symptoms, response to trial tx and previous tx with benefit.    Barriers to therapy: none identified    Therapy Goals      Goals:      Short Term Goals to be addressed in 4 weeks:  1. Pt will be independent with home stretching, ROM HEP to allow progression towards long-term goals, potentially increased comfort, ROM.   2. Pt will participate in MLD, be educated in self MLD to promote improved fluid transport, potentially decreased R breast and/or UE edema.  3. Pt will be able to articulate skin care recommendations, as well as signs/symptoms of infection, to promote long-term skin health, infection prevention, long-term maintenance of condition.    Long-Term Goals to be addressed in 8 weeks:  1. Pt will receive assistance with compression garment fitting and procurement, allowing potentially increased comfort, long-term maintenance of condition.   2. Patient to demonstrate reduction in R UE limb volume to no more than 12% larger than L UE.      Plan    Therapy options: will be seen for skilled physical therapy services    Planned therapy interventions: Body Mechanics Training, Compression Bandaging, Compression Pump, Diaphragmatic/Pursed-lip Breathing, Dry Needling, E-Stim, Education Therapist, art, Education - Patient, Endurance Activites, Functional Mobility,  Garment Measurement, Home Exercise Program, Manual Lymph Drainage, Manual Therapy, Neuromuscular Re-education, Postural Training, Self-Care/Home Training, Taping, TENS, Therapeutic Activities and Therapeutic Exercises      Frequency: 1x week    Duration in weeks: 8 weeks    Education provided to: patient.    Education provided: Equipment recommendations, HEP, Garment options, Manual lymph drainage and Treatment options and plan    Education results: needs reinforcement and needs further instruction.      Next visit plan:        MLD    Total Session Time: 55    Treatment rendered today:      ______________________________________________________________________    Manual Therapy x 55'  MLD, including   Short neck  L axillary  R inguinal  R to L anterior A-A anastamosis  R A-I anastamosis  Posterior A-A anastamosis  R UE  R breast   -silicone cupping along lymphatic pathways    -provided patient with card for P&O, patient plans to call and have one sleeve/glove set at 20-30 and one set at 30-40    ______________________________________________________________________            Subjective:   History of Present Condition  Date of Surgery: 05/07/2020 (ALND)    History of Present Condition/Chief Complaint:       R UE lymphedema    Subjective:     Tape became really uncomfortable and itchy after last time, so she doesn't want to do that anymore.     Therapist wore a mask for the entire session.     I reviewed the no-show/attendance policy with the patient and caregiver(s). The patient and/or family is aware that they must call to cancel appointments more than 24 hours in advance. They are also aware that if they late cancel (less than 24 hours from appointment), arrives greater than 15 minutes late, or no-show three times, we reserve the right to cancel their remaining appointments. This policy is in place to allow [place] to best serve the needs of our caseload.      Quality of life: good    Pain  Current pain rating: 0  At best pain rating: 0  At worst pain rating: 4  Location: R arm above elbow      Precautions and Equipment  Precautions: Cancer history  Prior Functional Status:     Functional Limitation(s)-No physical limitations  Current functional status: limited recreation and limited exercise        Treatments  Previous treatment: physical therapy      Patient Goals  Patient goals for therapy: decreased edema  Patient goal: manage symptoms before surgery      Objective:   Lymphedema                       Inital values taken: 02/28/2022  9:01 AM   Involved arm: Right   Dominant arm: Right   Patient position: Seated   Total volume: right 4355.41 mL 02/28/2022  9:01 AM   Total volume: left 3720.41 mL 02/28/2022  9:01 AM   L/R difference +635.00 mL +17.07 %          Inital values taken: 02/28/2022  9:01 AM   Involved arm: Right   Dominant arm: Right   Patient position: Seated   Total volume: right 4355.41 mL 02/28/2022  9:01 AM   Total volume: left 3720.41 mL 02/28/2022  9:01 AM   L/R difference +635.00 mL +17.07 %  I attest that I have reviewed the above information.  Signed: Edwena Bunde, PT  04/01/2022 5:59 AM

## 2022-04-05 NOTE — Unmapped (Signed)
Spoke to patient regarding UE compression garments. Patient spoke with lymphedema therapist and plan to order 1 set of arm sleeve and glove in 20-30 mmHg and 1 set of arm sleeve and glove in 30-40 mmHg. This is to ensure they are comfortable with the higher compression level.

## 2022-04-08 NOTE — Unmapped (Signed)
[facility] THERAPY SERVICES [place]  OUTPATIENT PHYSICAL THERAPY  04/08/2022  Note Type: Treatment Note       Patient Name: Tami Gibson  Date of Birth:23-Nov-1990  Diagnosis:   Encounter Diagnoses   Name Primary?    Lymphedema syndrome, postmastectomy Yes    Malignant neoplasm of right breast in female, estrogen receptor positive, unspecified site of breast (CMS-HCC)     Scar condition and fibrosis of skin     Lymphedema      Referring MD:  [name], MD    Date of Onset of Impairment-No date available  Date PT Care Plan Established or Reviewed-No date available  Date PT Treatment Started-No date available   Plan of Care Effective Date:     Oncology History Overview Note   31 year old female with recently self palpated right breast lump. A targeted right breast ultrasound on 11/15/2019 showed an irregular mass with spiculated margins at the 7:00 position, 6 cm from the nipple, measuring 1.4 x 1.2 x 1.0 cm. There were 2 abnormal level 1 axillary lymph nodes measuring 1.4 x 1.3 cm and 1.2 x 0.9 cm. Additionally, there was a high level 1/level 2 axillary lymph node, measuring 2.1 x 1.3 cm with an effaced fatty hilum.      Malignant neoplasm of right breast in female, estrogen receptor positive (CMS-HCC)   11/09/2019 Initial Diagnosis    Malignant neoplasm of right breast in female, estrogen receptor positive (CMS-HCC)     11/15/2019 Biopsy    Invasive ductal carcinoma  - Nottingham combined histologic grade: 1  ER 91-100%, PR 91-100%, HER2 negative by IHC    Lymph node, right axilla, core biopsy  - Lymph node positive for metastatic carcinoma,      12/02/2019 -  Cancer Staged    Staging form: Breast, AJCC 8th Edition  - Clinical: Stage IB (cT1c, cN1, cM0, G1, ER+, PR+, HER2-) - Signed by [name], MD on 12/02/2019       12/09/2019 - 03/22/2020 Chemotherapy    OP BREAST AC (Dose Dense) q2W X 4 CYCLES, THEN PACLITAXEL (DOSE DENSE) Q2W x 4 CYCLES  DOXOrubicin 60 mg/m2 IV on day 1, cyclophosphamide 600 mg/m2 IV on day 1, every 2 weeks for 4 cycles, then PACLitaxel 175 mg/m2 every 2 weeks for 4 cycles      04/20/2020 Surgery    Breast, right, simple mastectomy  -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 Extracapsular extension is identified      Breast, left, simple mastectomy  -Benign breast tissue with no atypia, in situ or invasive carcinoma identified     04/20/2020 -  Cancer Staged    Staging form: Breast, AJCC 8th Edition  - Pathologic stage from 04/20/2020: No Stage Recommended (ypT1c, pN1a, cM0, G2, ER+, PR+, HER2-) - Signed by [name], MD on 04/25/2020       05/07/2020 Surgery    Lymph nodes, right axillary, dissection  -One of sixteen lymph nodes involved by metastatic adenocarcinoma (1/16)  -Metastatic focus measures 3 mm in greatest dimension  -No extracapsular extension is identified     06/28/2020 - 08/06/2020 Radiation    The total radiation dose will be 5000 cGy at 200 cGy/fraction for a total of 25 fractions, treated once a day     08/13/2020 Endocrine/Hormone Therapy    Letrozole 2.5mg  +Ovarian suppression (  monthly lupron)     Malignant neoplasm of right breast in female, estrogen receptor positive (CMS-HCC)   05/07/2020 Surgery    Lymph nodes, right axillary, dissection  -One of sixteen lymph nodes involved by metastatic adenocarcinoma (1/16)  -Metastatic focus measures 3 mm in greatest dimension  -No extracapsular extension is identified     06/13/2020 -  Radiation    Radiation Therapy Treatment Details (Noted on 06/13/2020)  Site: Right Breast - Overlapping sites  Technique: 3D CRT  Goal: No goal specified  Planned Treatment Start Date: No planned start date specified     06/21/2020 -  Radiation    Radiation Therapy Treatment Details (Noted on 06/21/2020)  Site: Right Breast - Overlapping sites  Technique: 3D CRT  Goal: No goal specified  Planned Treatment Start Date: No planned start date specified           Assessment/Plan:    Assessment  Assessment details:    Patient tolerated manual lymphatic drainage well with no complaint during or after treatment. Patient verbalizes understanding of self-manual lymphatic drainage technique as well as appropriate use of garment and pump.  31 y.o. female presents with Stage I lymphedema secondary to breast cancer treatment.  In addition to lymphedema , the patient is experiencing these side effects from cancer treatments: Myofascial/scar restrictions post op  Radiation: Fibrosis/myofascial restrictions  Currently the right limb is 17 % bigger than the left limb.   Patient requires skilled Physical Therapy services  for the following problem list and secondary functional limitations:    Problem List:   Lack of knowledge of lymphedema/edema self care  Uncontrolled swelling and/or lack of appropriate compression garment    Secondary Functional Limitations:  Decreased knowledge of self care  of lymphedema/edema puts patient at risk for increased swelling and infection  Uncontrolled swelling can increase chances of  infection, and limit functional ROM             Impairments: postural weakness, increased edema, impaired sensation, muscular restrictions, uncontrolled swelling, impaired ADLs, decreased skin integrity and decreased knowledge of self-care                Prognosis: excellent prognosis    Positive Prognosis Rationale: motivated for treatment, caregiver/family support, chronicity of condition, severity of symptoms, response to trial tx and previous tx with benefit.    Barriers to therapy: none identified    Therapy Goals      Goals:      Short Term Goals to be addressed in 4 weeks:  1. Pt will be independent with home stretching, ROM HEP to allow progression towards long-term goals, potentially increased comfort, ROM.   2. Pt will participate in MLD, be educated in self MLD to promote improved fluid transport, potentially decreased R breast and/or UE edema.  3. Pt will be able to articulate skin care recommendations, as well as signs/symptoms of infection, to promote long-term skin health, infection prevention, long-term maintenance of condition.    Long-Term Goals to be addressed in 8 weeks:  1. Pt will receive assistance with compression garment fitting and procurement, allowing potentially increased comfort, long-term maintenance of condition.   2. Patient to demonstrate reduction in R UE limb volume to no more than 12% larger than L UE.      Plan    Therapy options: will be seen for skilled physical therapy services    Planned therapy interventions: Body Mechanics Training, Compression Bandaging, Compression Pump, Diaphragmatic/Pursed-lip Breathing, Dry Needling, E-Stim, Education Therapist, art, Education -  Patient, Endurance Activites, Functional Mobility, Garment Measurement, Home Exercise Program, Manual Lymph Drainage, Manual Therapy, Neuromuscular Re-education, Postural Training, Self-Care/Home Training, Taping, TENS, Therapeutic Activities and Therapeutic Exercises      Frequency: 1x week    Duration in weeks: 8 weeks    Education provided to: patient.    Education provided: Equipment recommendations, HEP, Garment options, Manual lymph drainage and Treatment options and plan    Education results: needs reinforcement and needs further instruction.      Next visit plan:        MLD    Total Session Time: 55    Treatment rendered today:      ______________________________________________________________________    Manual Therapy x 55'  MLD, including   Short neck  L axillary  R inguinal  R to L anterior A-A anastamosis  R A-I anastamosis  Posterior A-A anastamosis  R UE  R breast   -silicone cupping along lymphatic pathways    -provided patient with card for P&O, patient plans to call and have one sleeve/glove set at 20-30 and one set at 30-40    ______________________________________________________________________          Subjective:   History of Present Condition  Date of Surgery: 05/07/2020 (ALND)    History of Present Condition/Chief Complaint:       R UE lymphedema    Subjective:     Cupping felt really good last visit. Garments have been ordered, she's scheduled to pick them up on 12/8 but hoping she can do it sooner so she can order additional sets on this year's insurance.     Therapist wore a mask for the entire session.     I reviewed the no-show/attendance policy with the patient and caregiver(s). The patient and/or family is aware that they must call to cancel appointments more than 24 hours in advance. They are also aware that if they late cancel (less than 24 hours from appointment), arrives greater than 15 minutes late, or no-show three times, we reserve the right to cancel their remaining appointments. This policy is in place to allow [place] to best serve the needs of our caseload.      Quality of life: good    Pain  Current pain rating: 0  At best pain rating: 0  At worst pain rating: 4  Location: R arm above elbow      Precautions and Equipment  Precautions: Cancer history  Prior Functional Status:     Functional Limitation(s)-No physical limitations  Current functional status: limited recreation and limited exercise        Treatments  Previous treatment: physical therapy      Patient Goals  Patient goals for therapy: decreased edema  Patient goal: manage symptoms before surgery      Objective:   Lymphedema                       Inital values taken: 02/28/2022  9:01 AM   Involved arm: Right   Dominant arm: Right   Patient position: Seated   Total volume: right 4355.41 mL 02/28/2022  9:01 AM   Total volume: left 3720.41 mL 02/28/2022  9:01 AM   L/R difference +635.00 mL +17.07 %          Inital values taken: 02/28/2022  9:01 AM   Involved arm: Right   Dominant arm: Right   Patient position: Seated   Total volume: right 4355.41 mL 02/28/2022 9:01 AM   Total volume:  left 3720.41 mL 02/28/2022  9:01 AM   L/R difference +635.00 mL +17.07 %                                         I attest that I have reviewed the above information.  Signed: Edwena Bunde, PT  04/08/2022 8:40 AM

## 2022-04-09 DIAGNOSIS — C50911 Malignant neoplasm of unspecified site of right female breast: Principal | ICD-10-CM

## 2022-04-09 DIAGNOSIS — Z170 Estrogen receptor positive status [ER+]: Principal | ICD-10-CM

## 2022-04-10 DIAGNOSIS — C50911 Malignant neoplasm of unspecified site of right female breast: Principal | ICD-10-CM

## 2022-04-10 DIAGNOSIS — Z170 Estrogen receptor positive status [ER+]: Principal | ICD-10-CM

## 2022-04-10 MED ORDER — ABEMACICLIB 100 MG TABLET
ORAL_TABLET | Freq: Two times a day (BID) | ORAL | 5 refills | 28 days | Status: CP
Start: 2022-04-10 — End: ?
  Filled 2022-04-22: qty 56, 28d supply, fill #0

## 2022-04-14 ENCOUNTER — Institutional Professional Consult (permissible substitution): Admit: 2022-04-14 | Discharge: 2022-04-15 | Payer: PRIVATE HEALTH INSURANCE

## 2022-04-14 ENCOUNTER — Ambulatory Visit: Admit: 2022-04-14 | Discharge: 2022-04-15 | Payer: PRIVATE HEALTH INSURANCE

## 2022-04-14 DIAGNOSIS — Z170 Estrogen receptor positive status [ER+]: Principal | ICD-10-CM

## 2022-04-14 DIAGNOSIS — C50911 Malignant neoplasm of unspecified site of right female breast: Principal | ICD-10-CM

## 2022-04-14 MED ADMIN — leuprolide (LUPRON) injection 3.75 mg: 3.75 mg | INTRAMUSCULAR | @ 15:00:00 | Stop: 2022-04-14

## 2022-04-14 NOTE — Unmapped (Signed)
Compression Garments    Purpose/Function  Many people experience lymphedema or swelling following injury, cancer or even some medication use.  Compression garments are specially designed garments to help push fluid out of the arms or legs.  Compression garments can be made in a variety of different pressure settings.      Putting device on / taking it off  Most compression garments can be pulled on gently.  Sometimes slick silk socks can be used to make this easier.     Turn the garment right side out and insert your arm or leg   It can be helpful to pull from the center of the garment to get it started and then to pull from the top    Avoid using just your fingernails to pull the garment, otherwise it could tear the fabric.     Once the garment is in place, make sure that joints are positioned properly and the garment is fully on   Remove additional wrinkles by gently tugging or massaging the fabric  To remove, grab the garment at the top and pull it off, turning it inside out as you go    Wearing your device  Compression garments are tools for you.  Your physician or therapist may have specific recommendations for when to wear the garment.  Wearing your garment while doing strenuous activity, during times when you tend to swell more, during air travel, etc are particularly important.     Cleaning / Care / Infection Control  Each manufacturer has instructions.  Refer to these for your garment if available.  Most garments can be washed in a washing machine on the ???gentle cycle???.  Use a mild cleaning detergent.  Do not use bleach.  Do not place in dryer - set out to air dry.    The compression garment is meant to be used on the person for whom it was prescribed.  It should not be shared with other people or on other body parts.      Risks  Compression garments can cause discomfort, especially when moving to a higher compression level or during times of increased swelling. Overuse, wrinkles can cause skin irritation or blistering.  Like with new shoes, it may be necessary to break-in the brace by wearing it for short periods of time and checking your skin for any signs of irritation.    While some redness is expected when wearing tight compression garments, it should go away within 20-30 minutes.  If you experience any blisters or signs of rash discontinue wearing and contact your provider.        How to contact  If you experience any discomfort or have questions about the use of this orthosis you may contact your care team by calling [phone] or via mychart.

## 2022-04-14 NOTE — Unmapped (Cosign Needed)
Brief Pre-operative History & Physical    Patient name: Havilah Topor  CSN: [id number]  MRN: [id number]  Admit Date: 04/16/2022  Date of Surgery: 04/16/2022  Performing Service: Plastics    Code Status: Full Code      Assessment/Plan:      Sherlene is a 31 y.o. female with h/o Malignant neoplasm of right breast in female, estrogen receptor positive, who presents for:  Revision of bilateral reconstructed breasts with fat grafting, capsulorrhaphy on the right with possible implant exchange    Consent obtained in office is accurate. Risks, benefits, and alternatives to surgery were reviewed, and all questions were answered.    Proceed to the OR as planned.         History of Present Illness:    Karrine Kluttz is a 31 y.o. female with h/o Malignant neoplasm of right breast in female, estrogen receptor positive. She was recently seen in clinic, where a detailed HPI can be found. She was noted to benefit from:  Procedure(s) (LRB):  REVISION OF RECONSTRUCTED BREAST (Right)  GRAFTING OF AUTOLOGOUS FAT HARVESTED BY LIPOSUCTION TECHNIQUE TO TRUNK, BREASTS, SCALP, ARMS, AND/OR LEGS; 50 CC OR LESS INJECTATE (Bilateral)  INSERTION OR REPLACEMENT OF BREAST IMPLANT ON SEPERATE DAY FROM MASTECTOMY (N/A).       Allergies  Patient has no known allergies.    Medications    Current Facility-Administered Medications   Medication Dose Route Frequency Provider Last Rate Last Admin    ceFAZolin (ANCEF) IVPB 2 g in 50 ml dextrose (premix)  2 g Intravenous For OR use Yvonna Brun, MD        lactated Ringers infusion  10 mL/hr Intravenous Continuous Jayda White, MD        scopolamine (TRANSDERM-SCOP) 1 mg over 3 days topical patch 1 mg  1 patch Topical Q72H Lobonc, Ammie Ferrier, MD   1 mg at 04/16/22 5784     Facility-Administered Medications Ordered in Other Encounters   Medication Dose Route Frequency Provider Last Rate Last Admin    leuprolide (LUPRON) 3.75 mg injection                Vital Signs  BP 118/91  - Pulse 95  - Temp 36.2 ??C (97.2 ??F)  - Resp 18  - SpO2 96%   Facility age limit for growth %iles is 20 years.  Facility age limit for growth %iles is 20 years..     Physical Exam  General: Well developed, appears stated age, in no acute distress  Mental status: Alert and oriented x3  Cardiovascular: Normal  Pulmonary: Symmetric chest rise, unlabored breathing  Relevant System for Surgery: Surgical site examined and Patient was marked    Labs and Studies:  Lab Results   Component Value Date    WBC 4.3 03/26/2022    HGB 11.7 03/26/2022    HCT 34.1 03/26/2022    PLT 214 03/26/2022       No results found for: PT, INR, APTT

## 2022-04-14 NOTE — Unmapped (Signed)
Pt received lupron 3.75mg  injection in Left DorsoGluteus. Tolerated it well. Applied guaze and band aid.

## 2022-04-15 NOTE — Unmapped (Signed)
[facility] Refill Coordination Note    Specialty Medication(s) to be Shipped:   Hematology/Oncology: Verzenio 100mg     Other medication(s) to be shipped: No additional medications requested for fill at this time     Tami Gibson, DOB: Jul 06, 1990  Phone: [phone] (home)       All above HIPAA information was verified with patient.     Was a Nurse, learning disability used for this call? No    Completed refill call assessment today to schedule patient's medication shipment from the [facility] [phone]).  All relevant notes have been reviewed.     Specialty medication(s) and dose(s) confirmed: Regimen is correct and unchanged.   Changes to medications: Louie reports no changes at this time.  Changes to insurance: No  New side effects reported not previously addressed with a pharmacist or physician: None reported  Questions for the pharmacist: No    Confirmed patient received a Conservation officer, historic buildings and a Surveyor, mining with first shipment. The patient will receive a drug information handout for each medication shipped and additional FDA Medication Guides as required.       DISEASE/MEDICATION-SPECIFIC INFORMATION        N/A    SPECIALTY MEDICATION ADHERENCE     Medication Adherence    Patient reported X missed doses in the last month: 0  Specialty Medication: Verzenio 100mg   Patient is on additional specialty medications: No  Informant: patient                          Were doses missed due to medication being on hold? No    Verzenio 100 mg: 12 days of medicine on hand       REFERRAL TO PHARMACIST     Referral to the pharmacist: Not needed      [facility]     Shipping address confirmed in Epic.     Delivery Scheduled: Yes, Expected medication delivery date: 04/23/22.     Medication will be delivered via Next Day Courier to the prescription address in Epic WAM.    [name]   [facility] Specialty Technician

## 2022-04-15 NOTE — Unmapped (Signed)
[facility] [place]  OUTPATIENT PHYSICAL THERAPY  04/14/2022  Note Type: Treatment Note       Patient Name: Tami Gibson  Date of Birth:1990-09-29  Diagnosis:   Encounter Diagnoses   Name Primary?    Lymphedema syndrome, postmastectomy Yes    Malignant neoplasm of right breast in female, estrogen receptor positive, unspecified site of breast (CMS-HCC)      Referring MD:  [name], MD    Date of Onset of Impairment-No date available  Date PT Care Plan Established or Reviewed-No date available  Date PT Treatment Started-No date available   Plan of Care Effective Date:     Oncology History Overview Note   31 year old female with recently self palpated right breast lump. A targeted right breast ultrasound on 11/15/2019 showed an irregular mass with spiculated margins at the 7:00 position, 6 cm from the nipple, measuring 1.4 x 1.2 x 1.0 cm. There were 2 abnormal level 1 axillary lymph nodes measuring 1.4 x 1.3 cm and 1.2 x 0.9 cm. Additionally, there was a high level 1/level 2 axillary lymph node, measuring 2.1 x 1.3 cm with an effaced fatty hilum.      Malignant neoplasm of right breast in female, estrogen receptor positive (CMS-HCC)   11/09/2019 Initial Diagnosis    Malignant neoplasm of right breast in female, estrogen receptor positive (CMS-HCC)     11/15/2019 Biopsy    Invasive ductal carcinoma  - Nottingham combined histologic grade: 1  ER 91-100%, PR 91-100%, HER2 negative by IHC    Lymph node, right axilla, core biopsy  - Lymph node positive for metastatic carcinoma,      12/02/2019 -  Cancer Staged    Staging form: Breast, AJCC 8th Edition  - Clinical: Stage IB (cT1c, cN1, cM0, G1, ER+, PR+, HER2-) - Signed by [name], MD on 12/02/2019       12/09/2019 - 03/22/2020 Chemotherapy    OP BREAST AC (Dose Dense) q2W X 4 CYCLES, THEN PACLITAXEL (DOSE DENSE) Q2W x 4 CYCLES  DOXOrubicin 60 mg/m2 IV on day 1, cyclophosphamide 600 mg/m2 IV on day 1, every 2 weeks for 4 cycles, then PACLitaxel 175 mg/m2 every 2 weeks for 4 cycles      04/20/2020 Surgery    Breast, right, simple mastectomy  -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 Extracapsular extension is identified      Breast, left, simple mastectomy  -Benign breast tissue with no atypia, in situ or invasive carcinoma identified     04/20/2020 -  Cancer Staged    Staging form: Breast, AJCC 8th Edition  - Pathologic stage from 04/20/2020: No Stage Recommended (ypT1c, pN1a, cM0, G2, ER+, PR+, HER2-) - Signed by [name], MD on 04/25/2020       05/07/2020 Surgery    Lymph nodes, right axillary, dissection  -One of sixteen lymph nodes involved by metastatic adenocarcinoma (1/16)  -Metastatic focus measures 3 mm in greatest dimension  -No extracapsular extension is identified     06/28/2020 - 08/06/2020 Radiation    The total radiation dose will be 5000 cGy at 200 cGy/fraction for a total of 25 fractions, treated once a day     08/13/2020 Endocrine/Hormone Therapy    Letrozole 2.5mg  +Ovarian suppression (monthly lupron)     Malignant neoplasm of right breast in female, estrogen receptor  positive (CMS-HCC)   05/07/2020 Surgery    Lymph nodes, right axillary, dissection  -One of sixteen lymph nodes involved by metastatic adenocarcinoma (1/16)  -Metastatic focus measures 3 mm in greatest dimension  -No extracapsular extension is identified     06/13/2020 -  Radiation    Radiation Therapy Treatment Details (Noted on 06/13/2020)  Site: Right Breast - Overlapping sites  Technique: 3D CRT  Goal: No goal specified  Planned Treatment Start Date: No planned start date specified     06/21/2020 -  Radiation    Radiation Therapy Treatment Details (Noted on 06/21/2020)  Site: Right Breast - Overlapping sites  Technique: 3D CRT  Goal: No goal specified  Planned Treatment Start Date: No planned start date specified Assessment/Plan:    Assessment  Assessment details:    Patient tolerated manual lymphatic drainage well with no complaint during or after treatment. Patient verbalizes understanding of self-manual lymphatic drainage technique as well as appropriate use of garment and pump.  31 y.o. female presents with Stage I lymphedema secondary to breast cancer treatment.  In addition to lymphedema , the patient is experiencing these side effects from cancer treatments: Myofascial/scar restrictions post op  Radiation: Fibrosis/myofascial restrictions  Currently the right limb is 17 % bigger than the left limb.   Patient requires skilled Physical Therapy services  for the following problem list and secondary functional limitations:    Problem List:   Lack of knowledge of lymphedema/edema self care  Uncontrolled swelling and/or lack of appropriate compression garment    Secondary Functional Limitations:  Decreased knowledge of self care  of lymphedema/edema puts patient at risk for increased swelling and infection  Uncontrolled swelling can increase chances of  infection, and limit functional ROM             Impairments: postural weakness, increased edema, impaired sensation, muscular restrictions, uncontrolled swelling, impaired ADLs, decreased skin integrity and decreased knowledge of self-care                Prognosis: excellent prognosis    Positive Prognosis Rationale: motivated for treatment, caregiver/family support, chronicity of condition, severity of symptoms, response to trial tx and previous tx with benefit.    Barriers to therapy: none identified    Therapy Goals      Goals:      Short Term Goals to be addressed in 4 weeks:  1. Pt will be independent with home stretching, ROM HEP to allow progression towards long-term goals, potentially increased comfort, ROM.   2. Pt will participate in MLD, be educated in self MLD to promote improved fluid transport, potentially decreased R breast and/or UE edema.  3. Pt will be able to articulate skin care recommendations, as well as signs/symptoms of infection, to promote long-term skin health, infection prevention, long-term maintenance of condition.    Long-Term Goals to be addressed in 8 weeks:  1. Pt will receive assistance with compression garment fitting and procurement, allowing potentially increased comfort, long-term maintenance of condition.   2. Patient to demonstrate reduction in R UE limb volume to no more than 12% larger than L UE.      Plan    Therapy options: will be seen for skilled physical therapy services    Planned therapy interventions: Body Mechanics Training, Compression Bandaging, Compression Pump, Diaphragmatic/Pursed-lip Breathing, Dry Needling, E-Stim, Education Therapist, art, Education - Patient, Endurance Activites, Functional Mobility, Garment Measurement, Home Exercise Program, Manual Lymph Drainage, Manual Therapy, Neuromuscular Re-education, Postural Training, Self-Care/Home Training, Taping, TENS, Therapeutic  Activities and Therapeutic Exercises      Frequency: 1x week    Duration in weeks: 8 weeks    Education provided to: patient.    Education provided: Equipment recommendations, HEP, Garment options, Manual lymph drainage and Treatment options and plan    Education results: needs reinforcement and needs further instruction.      Next visit plan:        MLD    Total Session Time: 40    Treatment rendered today:      ______________________________________________________________________    Manual Therapy x 40'  MLD, including   Short neck  L axillary  R inguinal  R to L anterior A-A anastamosis  R A-I anastamosis  Posterior A-A anastamosis  R UE  R breast   -silicone cupping along lymphatic pathways    -provided patient with card for P&O, patient plans to call and have one sleeve/glove set at 20-30 and one set at 30-40    ______________________________________________________________________            Subjective:   History of Present Condition  Date of Surgery: 05/07/2020 (ALND)    History of Present Condition/Chief Complaint:       R UE lymphedema    Subjective:     Of note: patient arrived 20 min late to visit.  Doing well, picked up her compression this morning and the 30-40 is really tight but it's tolerable. She plans to wear that as much as possible immediately after the surgery and then will decide whether to order another 30-40 or stick to 20-30.     Therapist wore a mask for the entire session.     I reviewed the no-show/attendance policy with the patient and caregiver(s). The patient and/or family is aware that they must call to cancel appointments more than 24 hours in advance. They are also aware that if they late cancel (less than 24 hours from appointment), arrives greater than 15 minutes late, or no-show three times, we reserve the right to cancel their remaining appointments. This policy is in place to allow [place] to best serve the needs of our caseload.      Quality of life: good    Pain  Current pain rating: 0  At best pain rating: 0  At worst pain rating: 4  Location: R arm above elbow      Precautions and Equipment  Precautions: Cancer history  Prior Functional Status:     Functional Limitation(s)-No physical limitations  Current functional status: limited recreation and limited exercise        Treatments  Previous treatment: physical therapy      Patient Goals  Patient goals for therapy: decreased edema  Patient goal: manage symptoms before surgery      Objective:   Lymphedema                       Inital values taken: 02/28/2022  9:01 AM   Involved arm: Right   Dominant arm: Right   Patient position: Seated   Total volume: right 4355.41 mL 02/28/2022  9:01 AM   Total volume: left 3720.41 mL 02/28/2022  9:01 AM   L/R difference +635.00 mL +17.07 %          Inital values taken: 02/28/2022  9:01 AM   Involved arm: Right   Dominant arm: Right   Patient position: Seated   Total volume: right 4355.41 mL 02/28/2022  9:01 AM   Total volume: left 3720.41 mL  02/28/2022  9:01 AM   L/R difference +635.00 mL +17.07 %                                         I attest that I have reviewed the above information.  Signed: Edwena Bunde, PT  04/14/2022 4:08 PM

## 2022-04-15 NOTE — Unmapped (Signed)
[facility] [place]  Prosthetics & Orthotics  [phone]    Compression Garments Delivery      HPI   SUBJECTIVE:   Tami Gibson is a 31 y.o. female who presents for delivery of RUE compression garments. Upcoming surgery. Has lymphedema therapy today.      History     Changes since last visit:  none reported    Actions taken today       Device Data    Base Device  Quantity: 1  Side: Right  Description: JOBST BELLA STRONG ARMSLEEVE 30-40 W/SIL BLACK SZ 7 LNG  Part Number: [id number]  Manufacturer: Other (BSN)  Warranty: 90 Days  Action: Delivered                        Supplies  Supplies 1-5: Supply 2  Supply 1  Quantity: 1  Side: Right  Description: 20-30 MV HARMONY SL GLOVE BLACK IV  Part Number: [id number]  Manufacturer: Other (Medi)  Warranty: 90 Days  Action: Delivered  Supply 2  Quantity: 1  Side: Right  Description: 30-40 MV HARMONY SL GLOVE BLACK IV  Part Number: [id number]  Manufacturer: Other (Medi)  Warranty: 90 Days  Action: Delivered               Modifications made today:   Size  []     Alignment []     Other []       Any additions or modifications to the standard:     Any additional follow up needed:  12/8 for custom TributeNight and prefab 20-30 arm sleeve delivery      Goals     Short Term:  Reduce swelling in RUE    Wear device full time  Long Term:  Improve extremity blood flow  Reduce chance of blood clots  Reduce and prevent future swelling  Patient Goals:  Reduce pain and swelling  Manage lymphedema      Assessment   Assessment: Tami Gibson was seen for fitting and delivery of RUE compression garments for lymphedema. She discussed with therapist to trial 30-40 mmHg sleeve/glove and 20-30 mmHg sleeve/glove to ensure they are comfortable with the higher compression level. Discussed that 20-30 mmHg sleeve on backorder and change in brand given expected delivery date. Donned 30-40 mmHg sleeve and glove today. Tami Gibson confirmed comfort with fit of sleeve and glove. Garments appeared to be fitting well. Will keep 12/8 appointment for delivery of custom night garment and 20-30 arm sleeve. She will contact office to request additional garments (likely 2 sleeves/1 glove) after confirming comfort with fit. Has surgery this week and will not be able to wear garments after surgery until cleared.    Safety Check:    Device was checked for safety / security    Patient and/or caregiver are able to safely use and operate the device     Counseled patient about device wear and care, in particular we reviewed:     [x]  Device purpose   [x]  How to properly put on / take off  [x]  Proper wear and care   [x]  Safety precautions and risks   [x]  How to report problems          Plan    Return Plan: 12/8

## 2022-04-16 ENCOUNTER — Ambulatory Visit: Admit: 2022-04-16 | Discharge: 2022-04-17 | Payer: PRIVATE HEALTH INSURANCE

## 2022-04-16 ENCOUNTER — Encounter: Admit: 2022-04-16 | Discharge: 2022-04-17 | Payer: PRIVATE HEALTH INSURANCE

## 2022-04-16 MED ORDER — CEPHALEXIN 500 MG CAPSULE
ORAL_CAPSULE | Freq: Four times a day (QID) | ORAL | 0 refills | 3.00000 days | Status: CN
Start: 2022-04-16 — End: 2022-04-19

## 2022-04-16 MED ORDER — SULFAMETHOXAZOLE 800 MG-TRIMETHOPRIM 160 MG TABLET
ORAL_TABLET | Freq: Two times a day (BID) | ORAL | 0 refills | 7.00000 days | Status: CP
Start: 2022-04-16 — End: 2022-04-23
  Filled 2022-04-17: qty 14, 7d supply, fill #0

## 2022-04-16 MED ORDER — GABAPENTIN 100 MG CAPSULE
ORAL_CAPSULE | Freq: Three times a day (TID) | ORAL | 0 refills | 14.00000 days | Status: CP
Start: 2022-04-16 — End: 2022-04-30
  Filled 2022-04-17: qty 42, 14d supply, fill #0

## 2022-04-16 MED ORDER — OXYCODONE 5 MG TABLET
ORAL_TABLET | ORAL | 0 refills | 4.00000 days | Status: CP | PRN
Start: 2022-04-16 — End: 2022-04-21
  Filled 2022-04-17: qty 20, 4d supply, fill #0

## 2022-04-16 MED ADMIN — fentaNYL (PF) (SUBLIMAZE) injection 25 mcg: 25 ug | INTRAVENOUS | @ 17:00:00 | Stop: 2022-04-16

## 2022-04-16 MED ADMIN — lidocaine (XYLOCAINE) 20 mg/mL (2 %) injection: INTRAVENOUS | @ 14:00:00 | Stop: 2022-04-16

## 2022-04-16 MED ADMIN — oxyCODONE (ROXICODONE) immediate release tablet 5 mg: 5 mg | ORAL | @ 17:00:00 | Stop: 2022-04-16

## 2022-04-16 MED ADMIN — ROCuronium (ZEMURON) injection: INTRAVENOUS | @ 15:00:00 | Stop: 2022-04-16

## 2022-04-16 MED ADMIN — scopolamine (TRANSDERM-SCOP) 1 mg over 3 days topical patch 1 mg: 1 | TOPICAL | @ 12:00:00 | Stop: 2022-04-16

## 2022-04-16 MED ADMIN — fentaNYL (PF) (SUBLIMAZE) injection: INTRAVENOUS | @ 14:00:00 | Stop: 2022-04-16

## 2022-04-16 MED ADMIN — ROCuronium (ZEMURON) injection: INTRAVENOUS | @ 14:00:00 | Stop: 2022-04-16

## 2022-04-16 MED ADMIN — dexAMETHasone (DECADRON) 4 mg/mL injection: INTRAVENOUS | @ 14:00:00 | Stop: 2022-04-16

## 2022-04-16 MED ADMIN — HYDROmorphone (PF) injection Syrg 0.5 mg: .5 mg | INTRAVENOUS | @ 17:00:00 | Stop: 2022-04-16

## 2022-04-16 MED ADMIN — lactated Ringers infusion: 10 mL/h | INTRAVENOUS | @ 13:00:00

## 2022-04-16 MED ADMIN — ketamine (KETALAR) injection: INTRAVENOUS | @ 14:00:00 | Stop: 2022-04-16

## 2022-04-16 MED ADMIN — ceFAZolin (ANCEF) IVPB 2 g in 50 ml dextrose (premix): 2 g | INTRAVENOUS | @ 14:00:00 | Stop: 2022-04-16

## 2022-04-16 MED ADMIN — acetaminophen (TYLENOL) tablet 1,000 mg: 1000 mg | ORAL | @ 12:00:00 | Stop: 2022-04-16

## 2022-04-16 MED ADMIN — sugammadex (BRIDION) injection: INTRAVENOUS | @ 16:00:00 | Stop: 2022-04-16

## 2022-04-16 MED ADMIN — Propofol (DIPRIVAN) injection: INTRAVENOUS | @ 16:00:00 | Stop: 2022-04-16

## 2022-04-16 MED ADMIN — ondansetron (ZOFRAN) injection: INTRAVENOUS | @ 16:00:00 | Stop: 2022-04-16

## 2022-04-16 MED ADMIN — celecoxib (CeleBREX) capsule 200 mg: 200 mg | ORAL | @ 12:00:00 | Stop: 2022-04-16

## 2022-04-16 MED ADMIN — pregabalin (LYRICA) capsule 100 mg: 100 mg | ORAL | @ 12:00:00 | Stop: 2022-04-16

## 2022-04-16 MED ADMIN — midazolam (VERSED) injection: INTRAVENOUS | @ 13:00:00 | Stop: 2022-04-16

## 2022-04-16 MED ADMIN — sodium chloride irrigation (NS) 0.9 % irrigation solution: @ 14:00:00 | Stop: 2022-04-16

## 2022-04-16 MED ADMIN — ketorolac (TORADOL) injection 30 mg: 30 mg | INTRAVENOUS | @ 17:00:00 | Stop: 2022-04-16

## 2022-04-16 MED ADMIN — acetaminophen (TYLENOL) tablet 650 mg: 650 mg | ORAL | @ 20:00:00

## 2022-04-16 MED ADMIN — EPINEPHrine 1 mg in LR 1000 mL (Plastic Tumescent w/o lidocaine): SUBCUTANEOUS | @ 14:00:00 | Stop: 2022-04-16

## 2022-04-16 MED ADMIN — gabapentin (NEURONTIN) capsule 100 mg: 100 mg | ORAL | @ 20:00:00

## 2022-04-16 MED ADMIN — Propofol (DIPRIVAN) injection: INTRAVENOUS | @ 14:00:00 | Stop: 2022-04-16

## 2022-04-16 MED ADMIN — lactated Ringers irrigation solution: @ 14:00:00 | Stop: 2022-04-16

## 2022-04-16 MED ADMIN — polyethylene glycol (MIRALAX) packet 17 g: 17 g | ORAL | @ 20:00:00

## 2022-04-16 MED ADMIN — propofol (DIPRIVAN) infusion 10 mg/mL: INTRAVENOUS | @ 14:00:00 | Stop: 2022-04-16

## 2022-04-16 MED ADMIN — bupivacaine-EPINEPHrine (PF) (MARCAINE-PF w/EPI) 0.25 %-1:200,000 injection (PF): @ 14:00:00 | Stop: 2022-04-16

## 2022-04-16 NOTE — Unmapped (Addendum)
Date of Surgery: 04/16/2022    Pre-op Diagnosis: h/o Malignant neoplasm of right breast in female, estrogen receptor positive    Post-op Diagnosis: S/P breast reconstruction [Z98.890]  Malignant neoplasm of right breast in female, estrogen receptor positive, unspecified site of breast (CMS-HCC) [C50.911, Z17.0]    Procedure(s):  REVISION OF RECONSTRUCTED BREAST: 19380 (CPT??)  GRAFTING OF AUTOLOGOUS FAT HARVESTED BY LIPOSUCTION TECHNIQUE TO TRUNK, BREASTS, SCALP, ARMS, AND/OR LEGS; 50 CC OR LESS INJECTATE: 15771 (CPT??)  INSERTION OR REPLACEMENT OF BREAST IMPLANT ON SEPERATE DAY FROM MASTECTOMY: 19342 (CPT??)  Note: Revisions to procedures should be made in chart - see Procedures activity.    Performing Service: Librarian, academic) and Role:     * [place] Dragon, Jacki Cones, MD - Primary     * Floyde Parkins, MD - Resident - Assisting    Assistant: None    Findings: liposuction from abdomen, hips and flanks; fat grafting to bilateral breasts, left 30 mL, right 100 mL; right implant exchange for SCX 700 mL; right breast radial scoring at the lower breast and lowering of the IMF    Anesthesia: General    Estimated Blood Loss: 10 mL    Complications: None    Specimens:   ID Type Source Tests Collected by Time Destination   1 : right breast implant gross only Implant Breast, Right SURGICAL PATHOLOGY Zara Council, MD 04/16/2022 [phone]        Implants:   Implant Name Type Inv. Item Serial No. Manufacturer Lot No. LRB No. Used Action   IMPL COHESIVE SILICONE XFULL - [id number] Breast Implant or Tissue Expander Impl Cohesive Silicone X Full [id number] [employer]  Right 1 Explanted   Impl Cohesive Silicone X Full - [id number] Breast Implant or Tissue Expander Impl Cohesive Silicone X Full [id number] [facility]  Right 1 Implanted     Description of the Procedure: Ms Qasem was identified in the preoperative holding area, where her history and physical exam were updated.  She was marked in the standing position at the sternal notch, midline, IMF, clavicle, as well as areas for fat acquisition.  We reviewed plans for incisions and goals for the operation today.  Areas of contour irregularity were marked and confirmed with the patient.  She was then transported back to the operating room where she was positioned supine on the OR table.  A time out was called and the appropriate patient, procedure, and laterality were confirmed.  General endotracheal anesthesia was then induced via the anesthesia team.  Preoperative antibiotics were assured to be administered.  Bilateral arms were outstretched on arm boards, and secured with kerlix rolls to above the elbow. A foley catheter was placed via normal sterile technique, and removed at the conclusion of the case.  All pressure points were assured to be padded.  She was test sat up, and assured to be centrally positioned on the table. The chest and abdomen were prepped and draped in the usual sterile fashion.     Attention was first turned to the abdomen and flanks and hips; two small puncture sites were made to accommodate the wetting cannula after infiltration with local anesthesia.  The wetting solution, epinephrine only, was introduced in to the space, and allowed 20 minutes to take full effect.  The 3 mm triport cannula was used to obtain the lipoaspirate with a 3 mm triport cannula, which was collected in the REVOLVE system. This was processed via the manufacturers instructions.  Attention was turned to the right breast at the previous incision, which was infiltrated with local anesthesia.  A 15 blade scalpel was used to incise the skin.  Dense scarring was encountered.  The deep dermis was approximated with 3-0 vicryl, with a 4-0 monocryl in a running subcuticular fashion for the skin edge.  Attention was turned to the IMF, where the planned incision was made with the 15 blade scalpel. This was stair stepped to the capsule.  This was opened with cautery. The implant was removed and passed off the table as specimen. Sizers were then assessed.  A SCX sizer was selected. This was determined to fit the patient chest width best as well as providing projection and overall volume and better symmetry to the right.  Radial scoring was performed at the mid to lower pole of the breast.  The IMF was lowered with cautery.     The marked areas were then structurally fat grafted with small aliquots of prepared fat on each breast, using a #1 coleman cannula.  She was positioned upright and two small additional areas were assessed for further grafting.  She was repositioned supine and the areas were grafted.  The pickle fork was used to work the central scar to soften prior to fat grafting.     The right breast sizer was then removed.  Hemostasis was confirmed with her SBP within 10 percent of her normal preoperative SBP per anesthesia.  The skin was irrigated with a liter of antibiotic irrigation.  The skin edges were prepped with betadine.  All gloves were exchanged and instruments were new.  The implant was placed in the pocket with a single surgeon no touch technique with the use of the [employer].  The capsule was closed while irrigating with antibiotic, with 3-0 vicryl suture in a simple running fashion. The deep dermis was approximated with 3-0 vicryl in a simple buried fashion.  The skin was approximated with 4-0 monocryl in a running subcuticular fashion.      The excess wetting solution was removed and simple interrupted 4-0 monocryl sutures were placed at the fat acquisiton sites.  The fat grafting access points were also approximated with 4-0 monocryl in a simple fashion.  The skin was cleansed and a dressing of ABD pads were placed.  A post op bra was placed for the breasts, and abdominal binder for the abdomen.  She was awakened from anesthesia and transported to the PACU in stable condition.  All instruments were correct at the conclusion of the case.    Surgeon Notes: I was present and scrubbed for the entire procedure    Janet Berlin, MD   Date: 04/16/2022  Time: 11:32 AM

## 2022-04-17 MED ORDER — OMEPRAZOLE 20 MG CAPSULE,DELAYED RELEASE
ORAL_CAPSULE | Freq: Every day | ORAL | 0 refills | 90 days | Status: CP
Start: 2022-04-17 — End: ?

## 2022-04-17 MED ADMIN — gabapentin (NEURONTIN) capsule 100 mg: 100 mg | ORAL | @ 02:00:00

## 2022-04-17 MED ADMIN — polyethylene glycol (MIRALAX) packet 17 g: 17 g | ORAL | @ 14:00:00 | Stop: 2022-04-17

## 2022-04-17 MED ADMIN — acetaminophen (TYLENOL) tablet 650 mg: 650 mg | ORAL | @ 02:00:00

## 2022-04-17 MED ADMIN — sulfamethoxazole-trimethoprim (BACTRIM DS) 800-160 mg tablet 160 mg of trimethoprim: 1 | ORAL | @ 02:00:00 | Stop: 2022-04-23

## 2022-04-17 MED ADMIN — gabapentin (NEURONTIN) capsule 100 mg: 100 mg | ORAL | @ 14:00:00 | Stop: 2022-04-17

## 2022-04-17 MED ADMIN — acetaminophen (TYLENOL) tablet 650 mg: 650 mg | ORAL | @ 14:00:00 | Stop: 2022-04-17

## 2022-04-17 MED ADMIN — oxyCODONE (ROXICODONE) immediate release tablet 5 mg: 5 mg | ORAL | @ 09:00:00 | Stop: 2022-04-17

## 2022-04-17 MED ADMIN — sulfamethoxazole-trimethoprim (BACTRIM DS) 800-160 mg tablet 160 mg of trimethoprim: 1 | ORAL | @ 14:00:00 | Stop: 2022-04-17

## 2022-04-17 MED ADMIN — acetaminophen (TYLENOL) tablet 650 mg: 650 mg | ORAL | @ 07:00:00 | Stop: 2022-04-17

## 2022-04-17 NOTE — Unmapped (Signed)
Alert & oriented x4, vital signs stable on room air  Pain managed with scheduled pain medications and use of PRN 5 mg of oxycodone, given once this shift  Patient ambulates to toilet independently and is producing adequate urine, no falls at this time  No bowel movements overnight, tolerating a regular diet  Surgical sites to breasts covered by ABD pad and surgibra, surgical site to abdomen covered by ABD pads and abdominal binder  Scant sanguinous drainage, MD aware  No questions or concerns at this time, discharge on 11/16    Problem: Wound  Goal: Optimal Coping  Outcome: Progressing  Goal: Optimal Functional Ability  Outcome: Progressing  Intervention: Optimize Functional Ability  Recent Flowsheet Documentation  Taken 04/16/2022 2000 by Cynda Acres, RN  Activity Management:   ambulated in room   ambulated to bathroom  Goal: Absence of Infection Signs and Symptoms  Outcome: Progressing  Goal: Improved Oral Intake  Outcome: Progressing  Goal: Optimal Pain Control and Function  Outcome: Progressing  Goal: Skin Health and Integrity  Outcome: Progressing  Intervention: Optimize Skin Protection  Recent Flowsheet Documentation  Taken 04/16/2022 2000 by Cynda Acres, RN  Activity Management:   ambulated in room   ambulated to bathroom  Pressure Reduction Techniques:   frequent weight shift encouraged   heels elevated off bed  Head of Bed (HOB) Positioning: HOB at 30-45 degrees  Pressure Reduction Devices: pressure-redistributing mattress utilized  Skin Protection: adhesive use limited  Goal: Optimal Wound Healing  Outcome: Progressing     Problem: Adult Inpatient Plan of Care  Goal: Plan of Care Review  Outcome: Progressing  Goal: Patient-Specific Goal (Individualized)  Outcome: Progressing  Goal: Absence of Hospital-Acquired Illness or Injury  Outcome: Progressing  Intervention: Identify and Manage Fall Risk  Recent Flowsheet Documentation  Taken 04/16/2022 2000 by Cynda Acres, RN  Safety Interventions:   fall reduction program maintained   lighting adjusted for tasks/safety   low bed   nonskid shoes/slippers when out of bed  Intervention: Prevent Skin Injury  Recent Flowsheet Documentation  Taken 04/16/2022 2000 by Cynda Acres, RN  Positioning for Skin: Supine/Back  Device Skin Pressure Protection:   absorbent pad utilized/changed   adhesive use limited  Skin Protection: adhesive use limited  Intervention: Prevent and Manage VTE (Venous Thromboembolism) Risk  Recent Flowsheet Documentation  Taken 04/17/2022 0600 by Cynda Acres, RN  Anti-Embolism Device Type: SCD, Knee  Anti-Embolism Intervention: On  Anti-Embolism Device Location: BLE  Taken 04/17/2022 0400 by Cynda Acres, RN  Anti-Embolism Device Type: SCD, Knee  Anti-Embolism Intervention: On  Anti-Embolism Device Location: BLE  Taken 04/17/2022 0200 by Cynda Acres, RN  Anti-Embolism Device Type: SCD, Knee  Anti-Embolism Intervention: On  Anti-Embolism Device Location: BLE  Taken 04/17/2022 0000 by Cynda Acres, RN  Anti-Embolism Device Type: SCD, Knee  Anti-Embolism Intervention: On  Anti-Embolism Device Location: BLE  Taken 04/16/2022 2200 by Cynda Acres, RN  Anti-Embolism Device Type: SCD, Knee  Anti-Embolism Intervention: On  Anti-Embolism Device Location: BLE  Taken 04/16/2022 2111 by Cynda Acres, RN  Anti-Embolism Device Type: SCD, Knee  Anti-Embolism Intervention: On  Anti-Embolism Device Location: BLE  Taken 04/16/2022 2000 by Cynda Acres, RN  Anti-Embolism Device Type: SCD, Knee  Anti-Embolism Intervention: On  Anti-Embolism Device Location: BLE  Goal: Optimal Comfort and Wellbeing  Outcome: Progressing  Goal: Readiness for Transition of Care  Outcome: Progressing  Goal: Rounds/Family Conference  Outcome: Progressing

## 2022-04-17 NOTE — Unmapped (Signed)
Discharge Summary    Admit date: 04/16/2022    Discharge date and time: 04/17/2022    Discharge to:  Home    Discharge Service: Surg Plastic [facility])    Discharge Attending Physician: Arsenio Katz, MD    Discharge  Diagnoses: Revision recon R breast with AFG and implant exchange    Secondary Diagnosis: Active Problems:    Malignant neoplasm of right breast in female, estrogen receptor positive (CMS-HCC) (POA: Not Applicable)    S/P breast reconstruction (POA: Unknown)  Resolved Problems:    * No resolved hospital problems. *      OR Procedures:    Bilateral - REVISION OF RECONSTRUCTED BREAST  Bilateral - GRAFTING OF AUTOLOGOUS FAT HARVESTED BY LIPOSUCTION TECHNIQUE TO TRUNK, BREASTS, SCALP, ARMS, AND/OR LEGS; 50 CC OR LESS INJECTATE  Right - INSERTION OR REPLACEMENT OF BREAST IMPLANT ON SEPERATE DAY FROM MASTECTOMY  Date  04/16/2022  -------------------     Ancillary Procedures: None    Discharge Day Services:     Subjective   No acute events overnight. Pain Controlled. No fever or chills.    Objective   Patient Vitals for the past 8 hrs:   BP Temp Temp src Pulse Resp SpO2   04/17/22 0419 116/72 36.7 ??C (98.1 ??F) Oral 73 16 99 %   04/16/22 2310 112/71 36.7 ??C (98.1 ??F) Oral 82 16 96 %     No intake/output data recorded.    General Appearance:   No acute distress  Lungs:                Clear to auscultation bilaterally  Heart:                           Regular rate and rhythm  Abdomen:                Soft, non-tender, non-distended  Extremities:              Warm and well perfused    Hospital Course:  Patient underwent right breast reconstruction with AFG and implant exchange on 04/16/2022, the procedure was uncomplicated and the patient was transferred to the floor where they stayed overnight. Overnight there were no acute events, pain was well controlled and they were discharged in the morning in stable condition.     Condition at Discharge: Improved  Discharge Medications:      Medication List      START taking these medications     gabapentin 100 MG capsule; Commonly known as: NEURONTIN; Take 1 capsule   (100 mg total) by mouth Three (3) times a day for 14 days.   oxyCODONE 5 MG immediate release tablet; Commonly known as: ROXICODONE;   Take 1 tablet (5 mg total) by mouth every four (4) hours as needed for   pain for up to 5 days.   sulfamethoxazole-trimethoprim 800-160 mg per tablet; Commonly known as:   BACTRIM DS; Take 1 tablet (160 mg of trimethoprim total) by mouth every   twelve (12) hours for 7 days.     CONTINUE taking these medications     abemaciclib 100 mg Tab tablet; Commonly known as: VERZENIO; Take 1   tablet (100 mg total) by mouth Two (2) times a day. Take with or without   food.   acetaminophen 325 MG tablet; Commonly known as: TYLENOL   calcium carbonate 200 mg calcium (500 mg) chewable tablet; Commonly   known as: TUMS   *  cetirizine 5 MG chewable tablet; Commonly known as: ZYRTEC   * cetirizine 10 MG tablet; Commonly known as: ZYRTEC; Take 1 tablet (10   mg total) by mouth daily.   cholecalciferol (vitamin D3-25 mcg (1,000 unit)) 25 mcg (1,000 unit)   capsule   * cyanocobalamin 100 MCG tablet   * cyanocobalamin (vitamin B-12) 1000 MCG tablet   docusate sodium 100 MG capsule; Commonly known as: COLACE   escitalopram oxalate 20 MG tablet; Commonly known as: LEXAPRO; Take 1   tablet (20 mg total) by mouth daily.   letrozole 2.5 mg tablet; Commonly known as: FEMARA; Take 1 tablet (2.5   mg total) by mouth daily.   leuprolide 3.75 mg injection; Commonly known as: LUPRON   * omeprazole 20 MG capsule; Commonly known as: PriLOSEC; Take 1 capsule   by mouth once daily   * omeprazole 20 MG capsule; Commonly known as: PriLOSEC; Take 1 capsule   (20 mg total) by mouth daily.   ondansetron 4 MG disintegrating tablet; Commonly known as: ZOFRAN-ODT;   DISSOLVE 1 TABLET IN MOUTH EVERY 8 HOURS AS NEEDED FOR NAUSEA   * rimegepant 75 mg Tbdl; Commonly known as: NURTEC ODT   * rimegepant 75 mg Tbdl; Commonly known as: NURTEC ODT   * sumatriptan 25 MG tablet; Commonly known as: IMITREX   * sumatriptan 100 MG tablet; Commonly known as: IMITREX   topiramate 25 MG tablet; Commonly known as: TOPAMAX   traZODone 50 MG tablet; Commonly known as: DESYREL; TAKE 1/2 TO 1   (ONE-HALF TO ONE) TABLET BY MOUTH NIGHTLY AS NEEDED FOR SLEEP   vitamin E-134 mg (200 UNIT) 134 mg (200 UNIT) capsule   WEGOVY 1.7 mg/0.75 mL injection pen; Generic drug: semaglutide (weight   loss); Inject 0.75 mL (1.7 mg total) under the skin every seven (7) days.  * This list has 10 medication(s) that are the same as other medications   prescribed for you. Read the directions carefully, and ask your doctor or   other care provider to review them with you.     STOP taking these medications     hydroquinone 4 % cream       Pending Test Results:     Discharge Instructions:  Activity:     Diet:    Other Instructions:  Other Instructions       Discharge instructions      [facility] Plastic Surgery  Discharge Instructions for Epimenio Sarin, M.D.        Procedure performed: Revision of bilateral reconstructed breasts, fat grafting from the abdomen      Medications:    Antibiotics:  - You were prescribed antibiotics after surgery. You should take your antibiotic as prescribed. Do not stop taking your antibiotic early.    Pain and pain medication:  - You may alternate acetaminophen (Tylenol) and ibuprofen (Advil or Motrin), both available over-the-counter for pain relief. Follow the instructions on the bottle. Do not take more than 4 grams (4,000 mg) of acetaminophen/Tylenol per day.  - You have been prescribed gabapentin (Neurontin) as part of a pain control regimen. You should take these every day, as directed/prescribed, to help reduce your pain after surgery.  - You have been prescribed a narcotic pain medication. Please take only as directed/prescribed. Do not drive, operate heavy machinery, or make important decisions while taking these medications. Do not drink alcohol or take illegal drugs while on narcotic pain medication. These medications may cause constipation, itching, and nausea.  Stool softeners:  - Prescription narcotic pain medication, such as oxycodone, Percocet, or Vicodin, can cause constipation. Anaesthesia may also cause constipation. If you become constipated after surgery, you should take polyethylene glycol (Miralax) or sennokot (Senna) to help you have regular bowel movements. Follow the instructions on the bottle.  - If you are constipated after surgery, you should ensure that you have adequate fiber in your diet (>25 grams per day) and drink at least 64oz of water daily. If you become constipated even after using these medications, you may take magnesium citrate. Follow the instructions on the bottle.    Home medications:  - Please check in with your doctor regarding when to restart your Verzenio.   - You can otherwise restart your other home medications.      Restrictions:    Activity Restrictions:  - Do not exercise. No strenuous activity, no yoga. Do not lift any objects heavier than 5 lbs.  - No housework, yardwork, dishwashing, lawn mowing, shovelling, sweeping, Swiffering, cleaning the counters, gardening, dog-walking, swimming, exercising of any kind, yoga, Pilates.  - Walking is encouraged. Do not power-walk, but you may go for gentle walks.    What foods can I eat?:  Normal: You may resume eating normal foods. You do not have any food restrictions.    Showering and Bathing:  - You may shower 48 hours after surgery. When you shower, stand with your back facing the shower head and allow warm water and soap to run over your shoulders onto the surgical site. Dab the area dry, but do not scrub the area. Keep the surgical site clean and dry at all times otherwise.  - Absolutely no submerging under water. No baths, no hot tubs, no swimming, no pools, no lakes, no rivers, no oceans, etc. Doing so will significantly increase your risk of infection.      Wounds and Dressings:    Surgical Incisions:  - You have absorbable stitches in your incision. These will not need to be removed, and will dissolve on their own.  - You have surgical tape called SteriStrips covering the incision. These will fall off on their own; you may trim them back but do not pull them off.    Wounds and Dressings:  - You have absorbant dressings called ABD pads. You should change these daily, as well as when they become soiled. If you see some light bloody wound seepage through the bandage, do not worry as this is normal. Replace your dressings if needed.  - Continue dressing the site(s) of surgery until your follow up appointment.    Compression Garments:  - You have a surgical compression bra on. You should wear this at all times, day and night, except while showering or changing your dressings.  - You have a surgical compression binder over your abdomen and hips. You should wear this at all times, day and night, except while showering or changing your dressings. Your surgeon will tell you when to stop wearing your binder at one of your follow-up appointments.    When should I call my surgeon?:    Please call if you develop any of the following symptoms:  - Fever, i.e. temperature greater than 101.3??F (or 38.5??C) for adults, or 100.4??F (or 38.0??C) for children  - Persistent nausea or vomiting that does not go away  - Severe pain that cannot be controlled with prescription and over-the-counter pain medication  - Rapidly increasing swelling of at the site of surgery  - Redness,  tenderness, foul odor, or thick green or yellow discharge from a surgical site  - Large amounts of blood coming from your incisions or surgical sites  - Any other concerning symptom      APPOINTMENTS / QUESTIONS  For NON-urgent questions regarding appointments or your surgical care, you may reach out to your provider and/or their nursing staff via MyChart. Please keep in mind that it may take up to 2-3 business days for a response.    Additionally, you may contact our clinics, Monday through Friday, 8 am to 4:30 pm    [facility] Surgery at [facility]  [address],   [place] [address]  T: [phone]  F: [phone]      Physicians  Epimenio Sarin MD:   Nurse:  Vista Mink BSN, RN, CPSN [phone]    Surgery Scheduler: Lysle Morales  [phone]      APPs  Rush Landmark, PA-C:               Nurse: Tirrell Milo BSN, RN [phone]  Surgery Scheduler: Kristine Royal [phone]      FOR URGENT ISSUES AFTER HOURS/HOLIDAYS:  For urgent issues after-hours (after 5pm, or weekends): call the [facility] operator [phone]) and ask to page the Plastic Surgery resident on call. You will be directed to a surgery resident who likely is not immediately aware of the details of your case, but can help you deal with any emergencies that cannot wait until regular business hours.  Please be aware that this person is responding to many in-hospital emergencies and patient issues and may not answer your phone call immediately, but will return your call as soon as possible.    For emergencies, please call 911 or report to your nearest emergency department.         Follow-Up Appointments:  Your follow-up appointment has already been scheduled. Please see your discharge paperwork for the time and date. To change your follow-up appointment time, please call [facility] [facility], at the number listed above.          Labs and Other Follow-ups after Discharge:  Follow Up instructions and Outpatient Referrals     Discharge instructions          Future Appointments:  Appointments which have been scheduled for you      Apr 28, 2022  9:15 AM  (Arrive by 9:00 AM)  RETURN  GENERAL with Arsenio Katz, MD  [facility] PLASTIC AND RECONSTRUCTIVE SURGERY AT [facility] REGION) [address]  [address]  [place] [address]  [phone]        Apr 28, 2022 11:30 AM  (Arrive by 11:15 AM)  PT LYMPHEDEMA TREATMENT with Edwena Bunde, PT  [facility] THERAPY SERVICES [place] [facility] REGION) [address]  [place] [address]  [phone]        May 06, 2022  7:30 AM  (Arrive by 7:15 AM)  PT LYMPHEDEMA TREATMENT with Edwena Bunde, PT  [facility] THERAPY SERVICES [place] [facility] REGION) [address]  [place] [address]  [phone]        May 09, 2022  8:00 AM  PROSTHETIC ORTHOTIC DELIVERY with Hayden Rasmussen, [facility]  [facility] PROSTHETICS AND ORTHOTICS [facility] [place] REGION) [address]  [place] [address]  [phone]        May 09, 2022  9:00 AM  (Arrive by 8:45 AM)  NURSE VISIT with Demaris Callander, RN  [facility] PLASTIC AND RECONSTRUCTIVE SURGERY AT [facility] REGION) [address]  [place] [address]  [phone]        May 13, 2022  7:30 AM  (Arrive by 7:15 AM)  PT LYMPHEDEMA TREATMENT with Edwena Bunde, PT  [facility] THERAPY SERVICES [facility] REGION) [address]  [place] [address]  [phone]        May 15, 2022  9:00 AM  (Arrive by 8:30 AM)  Danelle Earthly NURSE with [facility] INJECTION MULTI  [place] [facility]IP[facility] 2ND FLR CANCER HOSP [facility] REGION) [address]  [place] [address]  [phone]        May 21, 2022  3:00 PM  (Arrive by 2:30 PM)  RETURN ACTIVE [place] with Reed Breech, [place]  [facility] CCSP 2ND [facility] CANCER HOSP [facility] REGION) [address]  [place] [address]  [phone]        May 30, 2022  9:00 AM  (Arrive by 8:45 AM)  NURSE VISIT with Demaris Callander, RN  [facility] PLASTIC AND RECONSTRUCTIVE SURGERY AT [facility] REGION) [address]  [place] [address]  [phone]        Jun 12, 2022 10:00 AM  (Arrive by 9:45 AM)  RETURN ACTIVE [place] with Rudie Meyer, ANP  [facility] SURGICAL ONCOLOGY [facility] REGION) [address]  [address]  [place] [address]  [phone]        Jun 25, 2022  2:00 PM  (Arrive by 1:30 PM)  LAB ONLY [place] with ADULT ONC LAB  [facility] ADULT ONCOLOGY LAB DRAW STATION [place] REGION) [address]  [place] [address]  [phone]        Jun 25, 2022  3:00 PM  (Arrive by 2:30 PM)  RETURN ACTIVE [place] with [name], MD  [facility] ONCOLOGY MULTIDISCIPLINARY 2ND FLR CANCER HOSP [facility] REGION) [address]  [place] [address]  [phone]        Sep 01, 2022 10:00 AM  (Arrive by 9:30 AM)  XR DEXA BONE DENSITY SKELETAL with Doran Durand RM 1  IMG DEXA [facility]) [address]  [place] [address]  [phone]   No calcium supplements 24 hrs prior.         Dec 25, 2022  8:30 AM  (Arrive by 8:00 AM)  FOLLOW UP 15 with Rogelio Seen, MD  [facility] RADIATION ONCOLOGY [place] [facility] REGION) [address]  [place] [address]  [phone]

## 2022-04-17 NOTE — Unmapped (Signed)
Please refill if appropriate.    Most recent clinic visit: 03/26/2022    Next clinic visit: 05/15/2022

## 2022-04-22 ENCOUNTER — Institutional Professional Consult (permissible substitution): Admit: 2022-04-22 | Discharge: 2022-04-23 | Payer: PRIVATE HEALTH INSURANCE

## 2022-04-22 NOTE — Unmapped (Signed)
Spoke with patient briefly after she came in for urine dip which was completely normal. She has urinary hesitancy and urgency, but occasional normal urinary stream in between. No dysuria or hematuria. Likely post-op urinary retention, but no signs of infection. Advised timed voiding every hour and increasing fluid intake. Wean opiates as soon as possible. Be aggressive constipation prevention by adding Miralax.

## 2022-04-22 NOTE — Unmapped (Signed)
POCT urine performed. Results given to PCP

## 2022-04-22 NOTE — Unmapped (Signed)
Appointment scheduled.

## 2022-04-28 ENCOUNTER — Ambulatory Visit: Admit: 2022-04-28 | Discharge: 2022-04-29 | Payer: PRIVATE HEALTH INSURANCE

## 2022-04-28 DIAGNOSIS — Z170 Estrogen receptor positive status [ER+]: Principal | ICD-10-CM

## 2022-04-28 DIAGNOSIS — C50911 Malignant neoplasm of unspecified site of right female breast: Principal | ICD-10-CM

## 2022-04-28 NOTE — Unmapped (Signed)
[facility]  OUTPATIENT PHYSICAL THERAPY  04/28/2022  Note Type: Treatment Note       Patient Name: Tami Gibson  Date of Birth:12/17/1990  Diagnosis:   Encounter Diagnoses   Name Primary?    Lymphedema syndrome, postmastectomy Yes    Malignant neoplasm of right breast in female, estrogen receptor positive, unspecified site of breast (CMS-HCC)     Scar condition and fibrosis of skin      Referring MD:  [name], MD    Date of Onset of Impairment-No date available  Date PT Care Plan Established or Reviewed-No date available  Date PT Treatment Started-No date available   Plan of Care Effective Date:     Oncology History Overview Note   31 year old female with recently self palpated right breast lump. A targeted right breast ultrasound on 11/15/2019 showed an irregular mass with spiculated margins at the 7:00 position, 6 cm from the nipple, measuring 1.4 x 1.2 x 1.0 cm. There were 2 abnormal level 1 axillary lymph nodes measuring 1.4 x 1.3 cm and 1.2 x 0.9 cm. Additionally, there was a high level 1/level 2 axillary lymph node, measuring 2.1 x 1.3 cm with an effaced fatty hilum.      Malignant neoplasm of right breast in female, estrogen receptor positive (CMS-HCC)   11/09/2019 Initial Diagnosis    Malignant neoplasm of right breast in female, estrogen receptor positive (CMS-HCC)     11/15/2019 Biopsy    Invasive ductal carcinoma  - Nottingham combined histologic grade: 1  ER 91-100%, PR 91-100%, HER2 negative by IHC    Lymph node, right axilla, core biopsy  - Lymph node positive for metastatic carcinoma,      12/02/2019 -  Cancer Staged    Staging form: Breast, AJCC 8th Edition  - Clinical: Stage IB (cT1c, cN1, cM0, G1, ER+, PR+, HER2-) - Signed by [name], MD on 12/02/2019       12/09/2019 - 03/22/2020 Chemotherapy    OP BREAST AC (Dose Dense) q2W X 4 CYCLES, THEN PACLITAXEL (DOSE DENSE) Q2W x 4 CYCLES  DOXOrubicin 60 mg/m2 IV on day 1, cyclophosphamide 600 mg/m2 IV on day 1, every 2 weeks for 4 cycles, then PACLitaxel 175 mg/m2 every 2 weeks for 4 cycles      04/20/2020 Surgery    Breast, right, simple mastectomy  -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 Extracapsular extension is identified      Breast, left, simple mastectomy  -Benign breast tissue with no atypia, in situ or invasive carcinoma identified     04/20/2020 -  Cancer Staged    Staging form: Breast, AJCC 8th Edition  - Pathologic stage from 04/20/2020: No Stage Recommended (ypT1c, pN1a, cM0, G2, ER+, PR+, HER2-) - Signed by [name], MD on 04/25/2020       05/07/2020 Surgery    Lymph nodes, right axillary, dissection  -One of sixteen lymph nodes involved by metastatic adenocarcinoma (1/16)  -Metastatic focus measures 3 mm in greatest dimension  -No extracapsular extension is identified     06/28/2020 - 08/06/2020 Radiation    The total radiation dose will be 5000 cGy at 200 cGy/fraction for a total of 25 fractions, treated once a day     08/13/2020 Endocrine/Hormone Therapy    Letrozole 2.5mg  +Ovarian suppression (monthly lupron)  Malignant neoplasm of right breast in female, estrogen receptor positive (CMS-HCC)   05/07/2020 Surgery    Lymph nodes, right axillary, dissection  -One of sixteen lymph nodes involved by metastatic adenocarcinoma (1/16)  -Metastatic focus measures 3 mm in greatest dimension  -No extracapsular extension is identified     06/13/2020 -  Radiation    Radiation Therapy Treatment Details (Noted on 06/13/2020)  Site: Right Breast - Overlapping sites  Technique: 3D CRT  Goal: No goal specified  Planned Treatment Start Date: No planned start date specified     06/21/2020 -  Radiation    Radiation Therapy Treatment Details (Noted on 06/21/2020)  Site: Right Breast - Overlapping sites  Technique: 3D CRT  Goal: No goal specified  Planned Treatment Start Date: No planned start date specified           Assessment/Plan:    Assessment  Assessment details:    Noted post-op swelling throughout R breast and axilla, with myofascial restriction as well, which improved post-treatment.  31 y.o. female presents with Stage I lymphedema secondary to breast cancer treatment.  In addition to lymphedema , the patient is experiencing these side effects from cancer treatments: Myofascial/scar restrictions post op  Radiation: Fibrosis/myofascial restrictions  Currently the right limb is 17 % bigger than the left limb.   Patient requires skilled Physical Therapy services  for the following problem list and secondary functional limitations:    Problem List:   Lack of knowledge of lymphedema/edema self care  Uncontrolled swelling and/or lack of appropriate compression garment    Secondary Functional Limitations:  Decreased knowledge of self care  of lymphedema/edema puts patient at risk for increased swelling and infection  Uncontrolled swelling can increase chances of  infection, and limit functional ROM             Impairments: postural weakness, increased edema, impaired sensation, muscular restrictions, uncontrolled swelling, impaired ADLs, decreased skin integrity and decreased knowledge of self-care                Prognosis: excellent prognosis    Positive Prognosis Rationale: motivated for treatment, caregiver/family support, chronicity of condition, severity of symptoms, response to trial tx and previous tx with benefit.    Barriers to therapy: none identified    Therapy Goals      Goals:      Short Term Goals to be addressed in 4 weeks:  1. Pt will be independent with home stretching, ROM HEP to allow progression towards long-term goals, potentially increased comfort, ROM.   2. Pt will participate in MLD, be educated in self MLD to promote improved fluid transport, potentially decreased R breast and/or UE edema.  3. Pt will be able to articulate skin care recommendations, as well as signs/symptoms of infection, to promote long-term skin health, infection prevention, long-term maintenance of condition.    Long-Term Goals to be addressed in 8 weeks:  1. Pt will receive assistance with compression garment fitting and procurement, allowing potentially increased comfort, long-term maintenance of condition.   2. Patient to demonstrate reduction in R UE limb volume to no more than 12% larger than L UE.      Plan    Therapy options: will be seen for skilled physical therapy services    Planned therapy interventions: Body Mechanics Training, Compression Bandaging, Compression Pump, Diaphragmatic/Pursed-lip Breathing, Dry Needling, E-Stim, Education Therapist, art, Education - Patient, Endurance Activites, Functional Mobility, Garment Measurement, Home Exercise Program, Manual Lymph Drainage, Manual Therapy, Neuromuscular Re-education, Postural Training,  Self-Care/Home Training, Taping, TENS, Therapeutic Activities and Therapeutic Exercises      Frequency: 1x week    Duration in weeks: 8 weeks    Education provided to: patient.    Education provided: Equipment recommendations, HEP, Garment options, Manual lymph drainage and Treatment options and plan    Education results: needs reinforcement and needs further instruction.      Next visit plan:        MLD    Total Session Time: 55    Treatment rendered today:      ______________________________________________________________________    Manual Therapy x 55'  MLD, including   Short neck  L axillary  R inguinal  R to L anterior A-A anastamosis  R A-I anastamosis  Posterior A-A anastamosis  R UE  R breast   -MFR to R breast, axilla    -provided patient with card for P&O, patient plans to call and have one sleeve/glove set at 20-30 and one set at 30-40    ______________________________________________________________________            Subjective:   History of Present Condition  Date of Surgery: 05/07/2020 (ALND)    History of Present Condition/Chief Complaint:       R UE lymphedema    Subjective:     Surgery went well, had follow up with surgeon this morning and got approval to wear spanx instead of the abdominal binder. Anticipates going back to work tomorrow, working from home. Has steristrips over incisions. Has some pain where they did the fat grafting on L hip but not R. Got approval this morning to sleep on L side. Still on lifting precautions.     Therapist wore a mask for the entire session.     I reviewed the no-show/attendance policy with the patient and caregiver(s). The patient and/or family is aware that they must call to cancel appointments more than 24 hours in advance. They are also aware that if they late cancel (less than 24 hours from appointment), arrives greater than 15 minutes late, or no-show three times, we reserve the right to cancel their remaining appointments. This policy is in place to allow [place] to best serve the needs of our caseload.      Quality of life: good    Pain  Current pain rating: 0  At best pain rating: 0  At worst pain rating: 4  Location: R arm above elbow      Precautions and Equipment  Precautions: Cancer history  Prior Functional Status:     Functional Limitation(s)-No physical limitations  Current functional status: limited recreation and limited exercise        Treatments  Previous treatment: physical therapy      Patient Goals  Patient goals for therapy: decreased edema  Patient goal: manage symptoms before surgery      Objective:   Lymphedema                       Inital values taken: 02/28/2022  9:01 AM   Involved arm: Right   Dominant arm: Right   Patient position: Seated   Total volume: right 4355.41 mL 02/28/2022  9:01 AM   Total volume: left 3720.41 mL 02/28/2022  9:01 AM   L/R difference +635.00 mL +17.07 %          Inital values taken: 02/28/2022  9:01 AM   Involved arm: Right   Dominant arm: Right   Patient position: Seated   Total volume:  right 4355.41 mL 02/28/2022  9:01 AM   Total volume: left 3720.41 mL 02/28/2022  9:01 AM   L/R difference +635.00 mL +17.07 %                                         I attest that I have reviewed the above information.  Signed: Edwena Bunde, PT  04/28/2022 11:33 AM

## 2022-04-28 NOTE — Unmapped (Signed)
Plastic Surgery  Clinic Follow-Up Note      Patient Name: Tami Gibson  Patient Age: 31 y.o.  Encounter Date: 04/28/2022    Assessment:  Tami Gibson is a 31 y.o. female past medical history of right breast IDC +/+/- s/p NACT s/p B SSM with right TAD and immediate reconstruction with tissue expander placement on 04/2020 and tissue expander to implant exchange (SCX 650 ml) with fat grafting to the bilateral breasts from the abdomen on 08/13/20 who is now s/p liposuction from abdomen and bilateral flank/hips, fat grafting to bilateral breast, R breast radial scoring, and R breast implant exchange to SCX 700cc on 04/16/2022. Patient healing well postoperatively.    Recommendations/Plan:  - Continue to follow movement and lifiting restrictions  - Continue compression spanks  - RTC in 2 weeks    History of Present Illness:     Tami Gibson is a 31 y.o. female past medical history of right breast IDC +/+/- s/p NACT and more recently s/p B SSM with right TAD (Dr. Teola Bradley) and immediate reconstruction with tissue expander placement on 04/20/20. Patients final pathology revealed residual disease with 2/3 positive lymph nodes and she underwent ALND with Dr. Teola Bradley on 05/07/20. She is on letrozole. She underwent adjuvant radiation (completed 08/06/20). She was originally interested in DIEP flap reconstruction but did not meet the BMI criteria. She underwent tissue expander to implant exchange (SCX 650 ml) with fat grafting to the bilateral breasts from the abdomen on 08/13/20. She took a 1 month course of Singulair to prevent capsular contracture. She is now s/p liposuction from abdomen and bilateral flank/hips, fat grafting to bilateral breast, R breast radial scoring, and R breast implant exchange to SCX 700cc on 04/16/2022. Pt doing well since surgery. Reports mild pain in left hip area and minimal bruising in liposuction and fat grafting sites. Been adherent to lifting restrictions and intermittently adherent to movement restrictions above head. Has been wearing compression garments as instructed. Reports that she sees improvement on how her breasts look now. Denies fever, chills, chest pain, SOB, cough, or other systemic symptoms of infections. Denies any redness or wound break down at incision sites.     Physical Exam  Neuro: alert and oriented, pain well controlled  Pulmonary: Normal respiratory effort.   Cardiovascular: Regular rate.   Extr: Warm/well perfused, no edema  Breasts: bilateral fat grafting sites healing well with minimal bruising. R breast surgical site with steristrips still in place with no strikethrough.  Abdomen, flank, and hips: sites with minimal bruising and no tenderness to palpation, minimal swelling, liposuction incision sites healing well with no erythema or drainage

## 2022-04-28 NOTE — Unmapped (Signed)
APPOINTMENTS / QUESTIONS  For NON-urgent questions regarding appointments or your surgical care, you may reach out to your provider and/or their nursing staff via MyChart. Please keep in mind that it may take up to 2-3 business days for a response.    Additionally, you may contact our clinics, Monday through Friday, 8 am to 4:30 pm    [facility] at [facility]  [address],   [place] [address]  T: [phone]  F: [phone]    [facility]  [address], [place], [place] [address]  [facility]  T: [phone]  F: [phone]    [facility] at [facility]  [address], [place], [place] [address]  T: [phone]  F: [phone]    [facility] at [facility]  [address], [place], [place] [address]  Third Floor, [address]  T: [phone]   F: [phone]    Ambulatory Surgery Center  1st Floor, Ambulatory Care Center  [address]  [place], [place] [address]  T: [phone]  Website:  [facility]    Administrative Office  [address]  [place], [place] [address]  T:  [phone]  F:  [phone]      Physicians:  Epimenio Sarin MD:   Nurse:  Vista Mink BSN, RN [phone]      Tarry Kos MD:   Nurse:  [name] BSN, RN, CPSN [phone]    Calvert Cantor MD   Nurse: Schwall Milo BSN, RN [phone]     Adeyemi Clydie Braun MD:  Nurse: Jasmine December BSN, RN, [place]  [phone]    Darleene Cleaver MD:  Nurse: Venetia Maxon BSN, RN, CPSN  [phone]      Advance Practice Providers:  Ezekiel Slocumb, DNP, NP-C:    Nurse:  Delk Milo BSN, RN [phone]    Rush Landmark, PA-C:    Nurse:  Markovitz Milo BSN, RN [phone]      Surgery Scheduling:  Kristine Royal [phone]  You will receive a phone call from the surgery schedulers regarding date for surgery in 1-2 weeks from  your appointment.    Please call one of the above numbers if you have not received a phone call 2 weeks after your appointment with the provider.        Financial Navigator:  Arlee Muslim  Hours:  7:30 am to 4 pm  P: [phone] - F: [phone]           Billing Questions/Financial Navigation:  [phone]  [facility] Customer Service Call Center:  [phone]    [facility] Contact Information:   Please call to schedule  *Radiology (CT, X-ray, Ultrasound) [phone]  *Mammography & Breast Ultrasound [phone]  *MRI [phone]  *Interventional Radiology Procedure Scheduling [phone]    AFTER HOURS/HOLIDAYS:  For emergencies after-hours (after 5pm, or weekends): call the [facility] operator [phone]) and ask to page the Plastic Surgery resident on call. You will be directed to a surgery resident who likely is not immediately aware of the details of your case, but can help you deal with any emergencies that cannot wait until regular business hours.  Please be aware that this person is responding to many in-hospital emergencies and patient issues and may not answer your phone call immediately, but will return your call as soon as possible.    Precare Location:  [employer] [facility] at [facility]  [address]  [place], [place] [address] ([place] - old [address] near [employer])    Blood Work Location:  [address] [place] [address]. There are no suite numbers, but it is located on the first floor there. Go to the main desk and tell them you are there for walk-in labs.  Precare:   The day prior to your scheduled surgery, Pre-Care will call you with instructions.  If you have not heard from them by 4PM, and would like to check on the status of your surgery, please call:  [facility]: [phone]  [facility]: [phone]  [facility]:  [phone]    Insurance Denials:  All appeal initiation needs to be started by the patient.  We do not appeal insurance denials.     Weather Hotline:  [phone]    FLMA forms and paperwork:  Please allow  a 2 week turn around for forms to be completed and faxed.

## 2022-05-06 ENCOUNTER — Ambulatory Visit
Admit: 2022-05-06 | Payer: PRIVATE HEALTH INSURANCE | Attending: Rehabilitative and Restorative Service Providers" | Primary: Rehabilitative and Restorative Service Providers"

## 2022-05-07 NOTE — Unmapped (Signed)
APPOINTMENTS / QUESTIONS  For NON-urgent questions regarding appointments or your surgical care, you may reach out to your provider and/or their nursing staff via MyChart. Please keep in mind that it may take up to 2-3 business days for a response.    Additionally, you may contact our clinics, Monday through Friday, 8 am to 4:30 pm    [facility] at [facility]  [address],   [place] [address]  T: [phone]  F: [phone]    [facility]  [address] [place], [place] [address]  Ground [facility]  T: [phone]  F: [phone]    [facility] at [facility]  [address] [place], [place] [address]  T: [phone]  F: [phone]    [facility] at [facility]  [address] [place], [place] [address]  Third Floor, [address]  T: [phone]   F: [phone]    Ambulatory Surgery Center  1st Floor, Ambulatory Care Center  [address]  [place], [place] [address]  T: [phone]  Website:  [facility]    Administrative Office  [address]  [place], [place] [address]  T:  [phone]  F:  [phone]      Physicians:  Epimenio Sarin MD:   Nurse:  Vista Mink BSN, RN [phone]      Tarry Kos MD:   Nurse:  [name] BSN, RN, CPSN [phone]    Calvert Cantor MD   Nurse: Solivan Milo BSN, RN [phone]     Adeyemi Clydie Braun MD:  Nurse: Jasmine December BSN, RN, [place]  [phone]    Darleene Cleaver MD:  Nurse: Venetia Maxon BSN, RN, CPSN  [phone]      Advance Practice Providers:  Ezekiel Slocumb, DNP, NP-C:    Nurse:  Gregson Milo BSN, RN [phone]    Rush Landmark, PA-C:    Nurse:  Roseland Milo BSN, RN [phone]      Surgery Scheduling:  Kristine Royal [phone]  You will receive a phone call from the surgery schedulers regarding date for surgery in about 10 business from your appointment.    Please call one of the above numbers if you have not received a phone call 2 weeks after your appointment with the provider.        Financial Navigator:  Arlee Muslim  Hours:  7:30 am to 4 pm  P: [phone] - F: [phone]           Billing Questions/Financial Navigation:  [phone]  [facility] Customer Service Call Center:  [phone]    [facility] Contact Information:   Please call to schedule  *Radiology (CT, X-ray, Ultrasound) [phone]  *Mammography & Breast Ultrasound [phone]  *MRI [phone]  *Interventional Radiology Procedure Scheduling [phone]    AFTER HOURS/HOLIDAYS:  For emergencies after-hours (after 5pm, or weekends): call the [facility] operator [phone] and ask to page the Plastic Surgery resident on call. You will be directed to a surgery resident who likely is not immediately aware of the details of your case, but can help you deal with any emergencies that cannot wait until regular business hours.  Please be aware that this person is responding to many in-hospital emergencies and patient issues and may not answer your phone call immediately, but will return your call as soon as possible.    Precare Location:  [employer] [facility] at [facility]  [address]  [place], [place] [address] ([place] - old [address] near [employer])    Blood Work Location:  [address] [place] [address]. There are no suite numbers, but it is located on the first floor there. Go to the main desk and tell them you are there for walk-in labs.  Precare:   The day prior to your scheduled surgery, Pre-Care will call you with instructions.  If you have not heard from them by 4PM, and would like to check on the status of your surgery, please call:  [facility]: [phone]  [facility]: [phone]  [facility]:  [phone]    Insurance Denials:  All appeal initiation needs to be started by the patient.  We do not appeal insurance denials.     Weather Hotline:  [phone]    FLMA forms and paperwork:  Please allow  a 2 week turn around for forms to be completed and faxed.

## 2022-05-07 NOTE — Unmapped (Signed)
CC:     HPI:  Tami Gibson is a 31 y.o. female who presents for post op check. She has a history of right breast IDC +/+/- s/p NACT and more recently s/p B SSM with right TAD (Dr. Teola Bradley) and immediate reconstruction with tissue expander placement on 04/20/20. Patients final pathology revealed residual disease with 2/3 positive lymph nodes and she underwent ALND with Dr. Teola Bradley on 05/07/20. She underwent adjuvant radiation (completed 08/06/20). She was originally interested in DIEP flap reconstruction but did not meet the BMI criteria. She underwent tissue expander to implant exchange (SCX 650 ml) with fat grafting to the bilateral breasts from the abdomen on 08/13/20. Most recently she underwent liposuction from abdomen and bilateral flank/hips, fat grafting to bilateral breast, R breast radial scoring, and R breast implant exchange to SCX 700cc on 04/16/2022.     Since last being seen, she has overall been doing well but has been having some headache and fatigue. She recently started back on Verzenio and attributes some of her symptoms to this. She has returned to work. She was wondering when she could start using her lymphedema pump again. She knows that the left breast is lower than the right but is fine with this for now and hoping to avoid surgeries in 2024. She is feeling much more like herself and that clothes are fitting much better.     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    followed by ophthalmologist    Kidney stone     Malignant neoplasm of right breast in female, 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/30/2019    Pyelonephritis 09/2016    Kidney stones    Urinary tract infection     Had 5-6 in Lifetime, esp high school and college age    Varicella     during childhood    Vitamin B 12 deficiency        Past Surgical History:  Past Surgical History:   Procedure Laterality Date    BREAST BIOPSY Right     IDC    CESAREAN SECTION  01/30/2017    breech    CHEMOTHERAPY      IR INSERT PORT AGE GREATER THAN 5 YRS  12/08/2019    IR INSERT PORT AGE GREATER THAN 5 YRS 12/08/2019 Jobe Gibbon, MD IMG VIR H&V [facility]    LUMBAR PUNCTURE DIAGNOSTIC [facility] HISTORICAL RESULT)  01/2015    to relieve ICP    PR BX/REMV,LYMPH NODE,DEEP AXILL Right 04/20/2020    Procedure: BX/EXC LYMPH NODE; OPEN, DEEP AXILRY NODE;  Surgeon: Moss Mc, MD;  Location: MAIN OR [facility];  Service: Surgical Oncology Breast    PR CESAREAN DELIVERY ONLY N/A 01/30/2017    Procedure: CESAREAN DELIVERY ONLY;  Surgeon: Asher Muir, MD;  Location: L&D C-SECTION OR SUITES [facility];  Service: Maternal-Fetal Medicine    PR CESAREAN DELIVERY ONLY N/A 02/17/2019  Procedure: CESAREAN DELIVERY ONLY;  Surgeon: Lonny Prude, MD;  Location: L&D C-SECTION OR SUITES [facility];  Service: Maternal-Fetal Medicine    PR GRAFTING OF AUTOLOGOUS FAT BY LIPO 50 CC OR LESS Bilateral 04/16/2022    Procedure: GRAFTING OF AUTOLOGOUS FAT HARVESTED BY LIPOSUCTION TECHNIQUE TO TRUNK, BREASTS, SCALP, ARMS, AND/OR LEGS; 50 CC OR LESS INJECTATE;  Surgeon: Arsenio Katz, MD;  Location: MAIN OR [place];  Service: Plastics    PR IMPLNT BIO IMPLNT FOR SOFT TISSUE REINFORCEMENT Bilateral 04/20/2020    Procedure: IMPLANTATION BIOLOGIC IMPLANT(EG, ACELLULAR DERMAL MATRIX) FOR SOFT TISSUE REINFORCEMENT(EG, BREAST, TRUNK);  Surgeon: Arsenio Katz, MD;  Location: MAIN OR [facility];  Service: Plastics    PR INSJ/RPLCMT BREAST IMPLANT SEP DAY MASTECTOMY Right 04/16/2022 Procedure: INSERTION OR REPLACEMENT OF BREAST IMPLANT ON SEPERATE DAY FROM MASTECTOMY;  Surgeon: Arsenio Katz, MD;  Location: MAIN OR [place];  Service: Plastics    PR INTRAOPERATIVE SENTINEL LYMPH NODE ID W DYE INJECTION Right 04/20/2020    Procedure: INTRAOPERATIVE IDENTIFICATION SENTINEL LYMPH NODE(S) INCLUDE INJECTION NON-RADIOACTIVE DYE, WHEN PERFORMED;  Surgeon: Moss Mc, MD;  Location: MAIN OR [place];  Service: Surgical Oncology Breast    PR INTRAOPERATIVE SENTINEL LYMPH NODE ID W DYE INJECTION Right 05/07/2020    Procedure: INTRAOPERATIVE IDENTIFICATION SENTINEL LYMPH NODE(S) INCLUDE INJECTION NON-RADIOACTIVE DYE, WHEN PERFORMED;  Surgeon: Moss Mc, MD;  Location: MAIN OR [place];  Service: Surgical Oncology Breast    PR MASTECTOMY, SIMPLE, COMPLETE Bilateral 04/20/2020    Procedure: MASTECTOMY, SIMPLE, COMPLETE;  Surgeon: Moss Mc, MD;  Location: [facility]IN OR [facility];  Service: Surgical Oncology Breast    PR REMOVE ARMPITS LYMPH NODES COMPLT Right 05/07/2020    Procedure: AXILLARY LYMPHADENECTOMY; COMPLETE;  Surgeon: Moss Mc, MD;  Location: [facility];  Service: Surgical Oncology Breast    PR REPLACEMENT TISSUE EXPANDER W/PERMANENT IMPLANT Bilateral 08/13/2021    Procedure: REPLACEMENT OF TISSUE EXPANDER WITH PERMANENT IMPLANT;  Surgeon: Arsenio Katz, MD;  Location: MAIN OR [facility];  Service: Plastics    PR REVISION OF RECONSTRUCTED BREAST Bilateral 04/16/2022    Procedure: REVISION OF RECONSTRUCTED BREAST;  Surgeon: Arsenio Katz, MD;  Location: MAIN OR ;  Service: Plastics    PR TISSUE EXPANDER PLACEMENT BREAST RECONSTRUCTION Bilateral 04/20/2020    Procedure: TISSUE EXPANDER PLACEMENT IN BREAST RECONSTRUCTION, INCLUDING SUBSEQUENT EXPANSION(S);  Surgeon: Arsenio Katz, MD;  Location: MAIN OR ;  Service: Plastics    TONSILECTOMY, ADENOIDECTOMY, BILATERAL MYRINGOTOMY AND TUBES      WISDOM TOOTH EXTRACTION  2004 Medications:  Current Outpatient Medications   Medication Sig Dispense Refill    abemaciclib (VERZENIO) 100 mg Tab tablet Take 1 tablet (100 mg total) by mouth Two (2) times a day. Take with or without food. 56 tablet 5    acetaminophen (TYLENOL) 325 MG tablet Take 2 tablets (650 mg total) by mouth every six (6) hours as needed for pain.      calcium carbonate (TUMS) 200 mg calcium (500 mg) chewable tablet Chew 1 tablet (200 mg of elem calcium total) daily.      cetirizine (ZYRTEC) 10 MG tablet Take 1 tablet (10 mg total) by mouth daily. 30 tablet 2    cetirizine (ZYRTEC) 5 MG chewable tablet Chew 1 tablet (5 mg total) daily.      cholecalciferol, vitamin D3-25 mcg, 1,000 unit,, 25 mcg (1,000 unit) capsule Take 1 capsule (25 mcg total) by mouth.      cyanocobalamin 100 MCG tablet Take 1  tablet (100 mcg total) by mouth daily.      cyanocobalamin, vitamin B-12, 1000 MCG tablet Take 1 tablet (1,000 mcg total) by mouth daily.      docusate sodium (COLACE) 100 MG capsule Take 1 capsule (100 mg total) by mouth nightly as needed (PRN).      escitalopram oxalate (LEXAPRO) 20 MG tablet Take 1 tablet (20 mg total) by mouth daily. 90 tablet 3    letrozole (FEMARA) 2.5 mg tablet Take 1 tablet (2.5 mg total) by mouth daily. 90 tablet 3    leuprolide (LUPRON) 3.75 mg injection Inject 3.75 mg into the muscle.      omeprazole (PRILOSEC) 20 MG capsule Take 1 capsule (20 mg total) by mouth daily. 90 capsule 0    omeprazole (PRILOSEC) 20 MG capsule Take 1 capsule by mouth once daily 90 capsule 0    ondansetron (ZOFRAN-ODT) 4 MG disintegrating tablet DISSOLVE 1 TABLET IN MOUTH EVERY 8 HOURS AS NEEDED FOR NAUSEA 18 tablet 0    rimegepant (NURTEC ODT) 75 mg TbDL Take 1 tablet (75 mg total) by mouth.      rimegepant (NURTEC ODT) 75 mg TbDL Take 1 tablet (75 mg total) by mouth daily as needed.      SUMAtriptan (IMITREX) 100 MG tablet TAKE ONE TABLET BY MOUTH AT ONSET OF MIGRAINE FOR UP TO 1 DOSE . IF SYMPTOMS PERSIST A SECOND DOSE MAY BE TAKEN IN 2 HOURS IF NEEDED      SUMAtriptan (IMITREX) 25 MG tablet Take 1 tablet (25 mg total) by mouth.      topiramate (TOPAMAX) 25 MG tablet TAKE 3 TABLETS BY MOUTH ONCE DAILY      traZODone (DESYREL) 50 MG tablet TAKE 1/2 TO 1 (ONE-HALF TO ONE) TABLET BY MOUTH NIGHTLY AS NEEDED FOR SLEEP 30 tablet 0    vitamin E-134 mg, 200 UNIT, 134 mg (200 UNIT) capsule Take 1 capsule (134 mg total) by mouth daily.      WEGOVY 1.7 MG/0.75 ML SUBCUTANEOUS PEN INJECTOR Inject 0.75 mL (1.7 mg total) under the skin every seven (7) days. 3 mL 3     No current facility-administered medications for this visit.     Facility-Administered Medications Ordered in Other Visits   Medication Dose Route Frequency Provider Last Rate Last Admin    leuprolide (LUPRON) 3.75 mg injection                Allergies:  No Known Allergies    Family History:   Family History   Problem Relation Age of Onset    Cancer Maternal Aunt 15        breast Ca    Hypertension Father     Other Father 29        Benign brain tumor    Hypothyroidism Mother     Cancer Paternal Aunt         Breast Ca dx in 55s    ALS Maternal Aunt     Ovarian cancer Maternal Grandmother 64    No Known Problems Maternal Grandfather     Cancer Paternal Grandmother         Unknown Cancer    No Known Problems Paternal Grandfather     No Known Problems Maternal Aunt     No Known Problems Maternal Aunt     Breast cancer Maternal Cousin         Dx in 3s    No Known Problems Paternal Aunt     No Known  Problems Daughter     No Known Problems Daughter     No Known Problems Other     BRCA 1/2 Neg Hx     Colon cancer Neg Hx     Endometrial cancer Neg Hx     The patient reports no family history for bleeding disorders or anesthetic problems.    Social History:  Social History     Socioeconomic History    Marital status: Married     Spouse name: Dylan    Number of children: 0   Occupational History    Occupation: Architect    Tobacco Use    Smoking status: Never    Smokeless tobacco: Never   Vaping Use    Vaping Use: Never used   Substance and Sexual Activity    Alcohol use: Not Currently    Drug use: No    Sexual activity: Yes     Partners: Male     Birth control/protection: Pill   Other Topics Concern    Exercise Yes    Living Situation No   Social History Narrative    PRECONCEPTUAL ASSESSMENT:        Tami Gibson is a 31 y.o. Caucasian female    Caffeine:denies use    Cats: no    Exercise: not active    Diet: balanced    Family hx of of birth defects, chromosomal abnormality, intellectual disability, developmental delay: no.            PARTNER HISTORY:     Domingo Dimes is a 49 y.o.  Caucasian female.    Occupation:  Immunologist; [employer]    He has fathered children:  no    He is healthy with no chronic illness    Family history of  birth defects, chromosomal abnormality, intellectual disability, or developmental delay: no.              Social Determinants of Health     Financial Resource Strain: Low Risk  (04/21/2020)    Overall Financial Resource Strain (CARDIA)     Difficulty of Paying Living Expenses: Not very hard   Food Insecurity: No Food Insecurity (04/21/2020)    Hunger Vital Sign     Worried About Running Out of Food in the Last Year: Never true     Ran Out of Food in the Last Year: Never true   Transportation Needs: No Transportation Needs (04/21/2020)    PRAPARE - Therapist, art (Medical): No     Lack of Transportation (Non-Medical): No       ROS:   Otherwise, 12 point review of systems was completed and is negative except as per HPI.      Vitals:   Vitals:    05/08/22 0832   BP: 105/77   Pulse: 92   Resp: 16   Temp: 36.2 ??C (97.2 ??F)   SpO2: 100%     ?  Physical Exam:   Gen: AA in NAD  HEENT: NCAT, EOMI, PERRL, Non icteric sclerae, MMM  CV: RRR  RESP: quiet respirations on room air  EXT: warm and well perfused  BREASTS: Bilateral breasts surgically absent with implants in place. Bilateral incisions are well approximated. No erythema or drainage. No undrained fluid collections. Minimal resolving bruising.  Right radiated breast with higher footprint  ABD: Liposuction sites well approximated. No obvious contour irregularity.   ?  Impression and Plan:  Tami Gibson is here today for post op check after liposuction  from abdomen and bilateral flank/hips, fat grafting to bilateral breast, R breast radial scoring, and R breast implant exchange to SCX 700cc on 04/16/2022. Doing well.     - Continue compression, bra, restrictions   - Wait until 4 weeks to use lymphedema pump  - Refill of Zofran ordered today   - Follow up on 12/29 for phone visit     No follow-ups on file.    ______________________________________________________________________    Documentation assistance was provided by Marga Melnick, Scribe, on May 08, 2022 at 9:05 AM for [place] C. [place] Dragon, MD    The documentation recorded by the scribe accurately reflects the service I personally performed and the decisions made by me Janet Berlin.

## 2022-05-08 ENCOUNTER — Ambulatory Visit: Admit: 2022-05-08 | Discharge: 2022-05-09 | Payer: PRIVATE HEALTH INSURANCE

## 2022-05-08 DIAGNOSIS — Z9889 Other specified postprocedural states: Principal | ICD-10-CM

## 2022-05-09 ENCOUNTER — Ambulatory Visit
Admit: 2022-05-09 | Discharge: 2022-05-10 | Payer: PRIVATE HEALTH INSURANCE | Attending: Rehabilitative and Restorative Service Providers" | Primary: Rehabilitative and Restorative Service Providers"

## 2022-05-09 NOTE — Unmapped (Signed)
Compression Garments    Purpose/Function  Many people experience lymphedema or swelling following injury, cancer or even some medication use.  Compression garments are specially designed garments to help push fluid out of the arms or legs.  Compression garments can be made in a variety of different pressure settings.      Putting device on / taking it off  Most compression garments can be pulled on gently.  Sometimes slick silk socks can be used to make this easier.     Turn the garment right side out and insert your arm or leg   It can be helpful to pull from the center of the garment to get it started and then to pull from the top    Avoid using just your fingernails to pull the garment, otherwise it could tear the fabric.     Once the garment is in place, make sure that joints are positioned properly and the garment is fully on   Remove additional wrinkles by gently tugging or massaging the fabric  To remove, grab the garment at the top and pull it off, turning it inside out as you go    Wearing your device  Compression garments are tools for you.  Your physician or therapist may have specific recommendations for when to wear the garment.  Wearing your garment while doing strenuous activity, during times when you tend to swell more, during air travel, etc are particularly important.     Cleaning / Care / Infection Control  Each manufacturer has instructions.  Refer to these for your garment if available.  Most garments can be washed in a washing machine on the ???gentle cycle???.  Use a mild cleaning detergent.  Do not use bleach.  Do not place in dryer - set out to air dry.    The compression garment is meant to be used on the person for whom it was prescribed.  It should not be shared with other people or on other body parts.      Risks  Compression garments can cause discomfort, especially when moving to a higher compression level or during times of increased swelling. Overuse, wrinkles can cause skin irritation or blistering.  Like with new shoes, it may be necessary to break-in the brace by wearing it for short periods of time and checking your skin for any signs of irritation.    While some redness is expected when wearing tight compression garments, it should go away within 20-30 minutes.  If you experience any blisters or signs of rash discontinue wearing and contact your provider.        How to contact  If you experience any discomfort or have questions about the use of this orthosis you may contact your care team by calling [phone] or via mychart.

## 2022-05-09 NOTE — Unmapped (Signed)
[facility]  Prosthetics & Orthotics  [phone]  Compression Delivery    Actions taken today     Devices provided:   Device Data    Base Device  Quantity: 1  Side: Right  Description: Tribute night brace  Part Number: [id number]  Serial Number: n/a  Manufacturer: Other (jobst)  Warranty: 90 Days  Action: Delivered    Supply 1  Quantity: 3  Side: Right  Description: Arm Sleeves  Part Number: [id number]  Serial Number: n/a  Manufacturer: Other (medi)  Warranty: 90 Days  Action: Delivered  Supply 2  Quantity: 1  Side: Right  Description: Medi glove  Part Number: [id number]  Serial Number: n/a  Manufacturer: Other (medi)  Warranty: 90 Days  Action: Delivered       Goals     Short Term:  Reduce swelling in right upper extremity  Reduce pain in right upper extremity  Wear device full time  Long Term:  Improve extremity blood flow  Reduce chance of blood clots  Reduce and prevent future swelling  Heal ulcers  Patient Goals:  Increase activity level  Reduce pain and swelling        Assessment   Assessment: Patient tolerated the fitting and delivery of the compression devices. The patient is an experienced wearer and has used these devices before. The devices fit well and were comfortable to the patient. The patient will follow up with [place] if an issues arises.       Device checked for safety and security during the visit today.      Instructions provided:   [x]  Device purpose   [x]  How to put on, take off and wear the device  [x]  Cleaning / Maintenance  [x]  Risks / Benefits of wearing   [x]  Instructions to contact should issues arise      Plan    Return Visit:  PRN

## 2022-05-09 NOTE — Unmapped (Signed)
[facility] THERAPY SERVICES [place]  OUTPATIENT PHYSICAL THERAPY  05/06/2022  Note Type: Treatment Note       Patient Name: Tami Gibson  Date of Birth:1990/08/24  Diagnosis:   Encounter Diagnoses   Name Primary?    Lymphedema syndrome, postmastectomy Yes    Malignant neoplasm of right breast in female, estrogen receptor positive, unspecified site of breast (CMS-HCC)     Scar condition and fibrosis of skin      Referring MD:  [name], MD    Date of Onset of Impairment-No date available  Date PT Care Plan Established or Reviewed-No date available  Date PT Treatment Started-No date available   Plan of Care Effective Date:     Oncology History Overview Note   31 year old female with recently self palpated right breast lump. A targeted right breast ultrasound on 11/15/2019 showed an irregular mass with spiculated margins at the 7:00 position, 6 cm from the nipple, measuring 1.4 x 1.2 x 1.0 cm. There were 2 abnormal level 1 axillary lymph nodes measuring 1.4 x 1.3 cm and 1.2 x 0.9 cm. Additionally, there was a high level 1/level 2 axillary lymph node, measuring 2.1 x 1.3 cm with an effaced fatty hilum.      Malignant neoplasm of right breast in female, estrogen receptor positive (CMS-HCC)   11/09/2019 Initial Diagnosis    Malignant neoplasm of right breast in female, estrogen receptor positive (CMS-HCC)     11/15/2019 Biopsy    Invasive ductal carcinoma  - Nottingham combined histologic grade: 1  ER 91-100%, PR 91-100%, HER2 negative by IHC    Lymph node, right axilla, core biopsy  - Lymph node positive for metastatic carcinoma,      12/02/2019 -  Cancer Staged    Staging form: Breast, AJCC 8th Edition  - Clinical: Stage IB (cT1c, cN1, cM0, G1, ER+, PR+, HER2-) - Signed by [name], MD on 12/02/2019       12/09/2019 - 03/22/2020 Chemotherapy    OP BREAST AC (Dose Dense) q2W X 4 CYCLES, THEN PACLITAXEL (DOSE DENSE) Q2W x 4 CYCLES  DOXOrubicin 60 mg/m2 IV on day 1, cyclophosphamide 600 mg/m2 IV on day 1, every 2 weeks for 4 cycles, then PACLitaxel 175 mg/m2 every 2 weeks for 4 cycles      04/20/2020 Surgery    Breast, right, simple mastectomy  -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 Extracapsular extension is identified      Breast, left, simple mastectomy  -Benign breast tissue with no atypia, in situ or invasive carcinoma identified     04/20/2020 -  Cancer Staged    Staging form: Breast, AJCC 8th Edition  - Pathologic stage from 04/20/2020: No Stage Recommended (ypT1c, pN1a, cM0, G2, ER+, PR+, HER2-) - Signed by [name], MD on 04/25/2020       05/07/2020 Surgery    Lymph nodes, right axillary, dissection  -One of sixteen lymph nodes involved by metastatic adenocarcinoma (1/16)  -Metastatic focus measures 3 mm in greatest dimension  -No extracapsular extension is identified     06/28/2020 - 08/06/2020 Radiation    The total radiation dose will be 5000 cGy at 200 cGy/fraction for a total of 25 fractions, treated once a day     08/13/2020 Endocrine/Hormone Therapy    Letrozole 2.5mg  +Ovarian suppression (monthly lupron)  Malignant neoplasm of right breast in female, estrogen receptor positive (CMS-HCC)   05/07/2020 Surgery    Lymph nodes, right axillary, dissection  -One of sixteen lymph nodes involved by metastatic adenocarcinoma (1/16)  -Metastatic focus measures 3 mm in greatest dimension  -No extracapsular extension is identified     06/13/2020 -  Radiation    Radiation Therapy Treatment Details (Noted on 06/13/2020)  Site: Right Breast - Overlapping sites  Technique: 3D CRT  Goal: No goal specified  Planned Treatment Start Date: No planned start date specified     06/21/2020 -  Radiation    Radiation Therapy Treatment Details (Noted on 06/21/2020)  Site: Right Breast - Overlapping sites  Technique: 3D CRT  Goal: No goal specified  Planned Treatment Start Date: No planned start date specified           Assessment/Plan:    Assessment  Assessment details:    Noted post-op swelling throughout R breast and axilla, with myofascial restriction as well, which improved post-treatment.  31 y.o. female presents with Stage I lymphedema secondary to breast cancer treatment.  In addition to lymphedema , the patient is experiencing these side effects from cancer treatments: Myofascial/scar restrictions post op  Radiation: Fibrosis/myofascial restrictions  Currently the right limb is 17 % bigger than the left limb.   Patient requires skilled Physical Therapy services  for the following problem list and secondary functional limitations:    Problem List:   Lack of knowledge of lymphedema/edema self care  Uncontrolled swelling and/or lack of appropriate compression garment    Secondary Functional Limitations:  Decreased knowledge of self care  of lymphedema/edema puts patient at risk for increased swelling and infection  Uncontrolled swelling can increase chances of  infection, and limit functional ROM             Impairments: postural weakness, increased edema, impaired sensation, muscular restrictions, uncontrolled swelling, impaired ADLs, decreased skin integrity and decreased knowledge of self-care                Prognosis: excellent prognosis    Positive Prognosis Rationale: motivated for treatment, caregiver/family support, chronicity of condition, severity of symptoms, response to trial tx and previous tx with benefit.    Barriers to therapy: none identified    Therapy Goals      Goals:      Short Term Goals to be addressed in 4 weeks:  1. Pt will be independent with home stretching, ROM HEP to allow progression towards long-term goals, potentially increased comfort, ROM.   2. Pt will participate in MLD, be educated in self MLD to promote improved fluid transport, potentially decreased R breast and/or UE edema.  3. Pt will be able to articulate skin care recommendations, as well as signs/symptoms of infection, to promote long-term skin health, infection prevention, long-term maintenance of condition.    Long-Term Goals to be addressed in 8 weeks:  1. Pt will receive assistance with compression garment fitting and procurement, allowing potentially increased comfort, long-term maintenance of condition.   2. Patient to demonstrate reduction in R UE limb volume to no more than 12% larger than L UE.      Plan    Therapy options: will be seen for skilled physical therapy services    Planned therapy interventions: Body Mechanics Training, Compression Bandaging, Compression Pump, Diaphragmatic/Pursed-lip Breathing, Dry Needling, E-Stim, Education Therapist, art, Education - Patient, Endurance Activites, Functional Mobility, Garment Measurement, Home Exercise Program, Manual Lymph Drainage, Manual Therapy, Neuromuscular Re-education, Postural Training,  Self-Care/Home Training, Taping, TENS, Therapeutic Activities and Therapeutic Exercises      Frequency: 1x week    Duration in weeks: 8 weeks    Education provided to: patient.    Education provided: Equipment recommendations, HEP, Garment options, Manual lymph drainage and Treatment options and plan    Education results: needs reinforcement and needs further instruction.      Next visit plan:        MLD    Total Session Time: 55    Treatment rendered today:      ______________________________________________________________________    Manual Therapy x 55'  MLD, including   Short neck  L axillary  R inguinal  R to L anterior A-A anastamosis  R A-I anastamosis  Posterior A-A anastamosis  R UE  R breast   -MFR to R breast, axilla    -provided patient with card for P&O, patient plans to call and have one sleeve/glove set at 20-30 and one set at 30-40    ______________________________________________________________________            Subjective:   History of Present Condition  Date of Surgery: 05/07/2020 (ALND)    History of Present Condition/Chief Complaint:       R UE lymphedema    Subjective:     Doing well, still having some tightness.    Therapist wore a mask for the entire session.     I reviewed the no-show/attendance policy with the patient and caregiver(s). The patient and/or family is aware that they must call to cancel appointments more than 24 hours in advance. They are also aware that if they late cancel (less than 24 hours from appointment), arrives greater than 15 minutes late, or no-show three times, we reserve the right to cancel their remaining appointments. This policy is in place to allow [place] to best serve the needs of our caseload.      Quality of life: good    Pain  Current pain rating: 0  At best pain rating: 0  At worst pain rating: 4  Location: R arm above elbow      Precautions and Equipment  Precautions: Cancer history  Prior Functional Status:     Functional Limitation(s)-No physical limitations  Current functional status: limited recreation and limited exercise        Treatments  Previous treatment: physical therapy      Patient Goals  Patient goals for therapy: decreased edema  Patient goal: manage symptoms before surgery      Objective:   Lymphedema                       Inital values taken: 02/28/2022  9:01 AM   Involved arm: Right   Dominant arm: Right   Patient position: Seated   Total volume: right 4355.41 mL 02/28/2022  9:01 AM   Total volume: left 3720.41 mL 02/28/2022  9:01 AM   L/R difference +635.00 mL +17.07 %          Inital values taken: 02/28/2022  9:01 AM   Involved arm: Right   Dominant arm: Right   Patient position: Seated   Total volume: right 4355.41 mL 02/28/2022  9:01 AM   Total volume: left 3720.41 mL 02/28/2022  9:01 AM   L/R difference +635.00 mL +17.07 %  I attest that I have reviewed the above information.  Signed: Edwena Bunde, PT  05/06/2022 1:28 PM

## 2022-05-13 NOTE — Unmapped (Signed)
[facility] THERAPY SERVICES [place]  OUTPATIENT PHYSICAL THERAPY  05/13/2022  Note Type: Treatment Note       Patient Name: Tami Gibson  Date of Birth:11/04/1990  Diagnosis:   Encounter Diagnoses   Name Primary?    Lymphedema syndrome, postmastectomy Yes    Malignant neoplasm of right breast in female, estrogen receptor positive, unspecified site of breast (CMS-HCC)     Scar condition and fibrosis of skin     Lymphedema      Referring MD:  [name], MD    Date of Onset of Impairment-No date available  Date PT Care Plan Established or Reviewed-No date available  Date PT Treatment Started-No date available   Plan of Care Effective Date:     Oncology History Overview Note   31 year old female with recently self palpated right breast lump. A targeted right breast ultrasound on 11/15/2019 showed an irregular mass with spiculated margins at the 7:00 position, 6 cm from the nipple, measuring 1.4 x 1.2 x 1.0 cm. There were 2 abnormal level 1 axillary lymph nodes measuring 1.4 x 1.3 cm and 1.2 x 0.9 cm. Additionally, there was a high level 1/level 2 axillary lymph node, measuring 2.1 x 1.3 cm with an effaced fatty hilum.      Malignant neoplasm of right breast in female, estrogen receptor positive (CMS-HCC)   11/09/2019 Initial Diagnosis    Malignant neoplasm of right breast in female, estrogen receptor positive (CMS-HCC)     11/15/2019 Biopsy    Invasive ductal carcinoma  - Nottingham combined histologic grade: 1  ER 91-100%, PR 91-100%, HER2 negative by IHC    Lymph node, right axilla, core biopsy  - Lymph node positive for metastatic carcinoma,      12/02/2019 -  Cancer Staged    Staging form: Breast, AJCC 8th Edition  - Clinical: Stage IB (cT1c, cN1, cM0, G1, ER+, PR+, HER2-) - Signed by [name], MD on 12/02/2019       12/09/2019 - 03/22/2020 Chemotherapy    OP BREAST AC (Dose Dense) q2W X 4 CYCLES, THEN PACLITAXEL (DOSE DENSE) Q2W x 4 CYCLES  DOXOrubicin 60 mg/m2 IV on day 1, cyclophosphamide 600 mg/m2 IV on day 1, every 2 weeks for 4 cycles, then PACLitaxel 175 mg/m2 every 2 weeks for 4 cycles      04/20/2020 Surgery    Breast, right, simple mastectomy  -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 Extracapsular extension is identified      Breast, left, simple mastectomy  -Benign breast tissue with no atypia, in situ or invasive carcinoma identified     04/20/2020 -  Cancer Staged    Staging form: Breast, AJCC 8th Edition  - Pathologic stage from 04/20/2020: No Stage Recommended (ypT1c, pN1a, cM0, G2, ER+, PR+, HER2-) - Signed by [name], MD on 04/25/2020       05/07/2020 Surgery    Lymph nodes, right axillary, dissection  -One of sixteen lymph nodes involved by metastatic adenocarcinoma (1/16)  -Metastatic focus measures 3 mm in greatest dimension  -No extracapsular extension is identified     06/28/2020 - 08/06/2020 Radiation    The total radiation dose will be 5000 cGy at 200 cGy/fraction for a total of 25 fractions, treated once a day     08/13/2020 Endocrine/Hormone Therapy    Letrozole 2.5mg  +Ovarian suppression (  monthly lupron)     Malignant neoplasm of right breast in female, estrogen receptor positive (CMS-HCC)   05/07/2020 Surgery    Lymph nodes, right axillary, dissection  -One of sixteen lymph nodes involved by metastatic adenocarcinoma (1/16)  -Metastatic focus measures 3 mm in greatest dimension  -No extracapsular extension is identified     06/13/2020 -  Radiation    Radiation Therapy Treatment Details (Noted on 06/13/2020)  Site: Right Breast - Overlapping sites  Technique: 3D CRT  Goal: No goal specified  Planned Treatment Start Date: No planned start date specified     06/21/2020 -  Radiation    Radiation Therapy Treatment Details (Noted on 06/21/2020)  Site: Right Breast - Overlapping sites  Technique: 3D CRT  Goal: No goal specified  Planned Treatment Start Date: No planned start date specified           Assessment/Plan:    Assessment  Assessment details:    Myofascial restriction and muscle tightness in R axilla, shoulder, pec; improved following treatment.  31 y.o. female presents with Stage I lymphedema secondary to breast cancer treatment.  In addition to lymphedema , the patient is experiencing these side effects from cancer treatments: Myofascial/scar restrictions post op  Radiation: Fibrosis/myofascial restrictions  Currently the right limb is 17 % bigger than the left limb.   Patient requires skilled Physical Therapy services  for the following problem list and secondary functional limitations:    Problem List:   Lack of knowledge of lymphedema/edema self care  Uncontrolled swelling and/or lack of appropriate compression garment    Secondary Functional Limitations:  Decreased knowledge of self care  of lymphedema/edema puts patient at risk for increased swelling and infection  Uncontrolled swelling can increase chances of  infection, and limit functional ROM             Impairments: postural weakness, increased edema, impaired sensation, muscular restrictions, uncontrolled swelling, impaired ADLs, decreased skin integrity and decreased knowledge of self-care                Prognosis: excellent prognosis    Positive Prognosis Rationale: motivated for treatment, caregiver/family support, chronicity of condition, severity of symptoms, response to trial tx and previous tx with benefit.    Barriers to therapy: none identified    Therapy Goals      Goals:      Short Term Goals to be addressed in 4 weeks:  1. Pt will be independent with home stretching, ROM HEP to allow progression towards long-term goals, potentially increased comfort, ROM.   2. Pt will participate in MLD, be educated in self MLD to promote improved fluid transport, potentially decreased R breast and/or UE edema.  3. Pt will be able to articulate skin care recommendations, as well as signs/symptoms of infection, to promote long-term skin health, infection prevention, long-term maintenance of condition.    Long-Term Goals to be addressed in 8 weeks:  1. Pt will receive assistance with compression garment fitting and procurement, allowing potentially increased comfort, long-term maintenance of condition.   2. Patient to demonstrate reduction in R UE limb volume to no more than 12% larger than L UE.      Plan    Therapy options: will be seen for skilled physical therapy services    Planned therapy interventions: Body Mechanics Training, Compression Bandaging, Compression Pump, Diaphragmatic/Pursed-lip Breathing, Dry Needling, E-Stim, Education Therapist, art, Education - Patient, Endurance Activites, Functional Mobility, Garment Measurement, Home Exercise Program, Manual Lymph Drainage, Manual Therapy, Neuromuscular  Re-education, Postural Training, Self-Care/Home Training, Taping, TENS, Therapeutic Activities and Therapeutic Exercises      Frequency: 1x week    Duration in weeks: 8 weeks    Education provided to: patient.    Education provided: Equipment recommendations, HEP, Garment options, Manual lymph drainage and Treatment options and plan    Education results: needs reinforcement and needs further instruction.      Next visit plan:        MLD    Total Session Time: 55    Treatment rendered today:      ______________________________________________________________________    Manual Therapy x 45'  MLD, including   Short neck  L axillary  R inguinal  R to L anterior A-A anastamosis  R A-I anastamosis  Posterior A-A anastamosis  R UE  R breast   -MFR to R breast, axilla    Therapeutic Exercise x 10'  -pulleys, flex and abd    -provided patient with card for P&O, patient plans to call and have one sleeve/glove set at 20-30 and one set at 30-40    ______________________________________________________________________            Subjective:   History of Present Condition  Date of Surgery: 05/07/2020 (ALND)    History of Present Condition/Chief Complaint:       R UE lymphedema    Subjective:     Has some tightness in R axilla that feels like it's restricting her range of motion when she does something like pulling a blanket over herself.    Therapist wore a mask for the entire session.     I reviewed the no-show/attendance policy with the patient and caregiver(s). The patient and/or family is aware that they must call to cancel appointments more than 24 hours in advance. They are also aware that if they late cancel (less than 24 hours from appointment), arrives greater than 15 minutes late, or no-show three times, we reserve the right to cancel their remaining appointments. This policy is in place to allow [place] to best serve the needs of our caseload.      Quality of life: good    Pain  Current pain rating: 0  At best pain rating: 0  At worst pain rating: 4  Location: R arm above elbow      Precautions and Equipment  Precautions: Cancer history  Prior Functional Status:     Functional Limitation(s)-No physical limitations  Current functional status: limited recreation and limited exercise        Treatments  Previous treatment: physical therapy      Patient Goals  Patient goals for therapy: decreased edema  Patient goal: manage symptoms before surgery      Objective:   Lymphedema                       Inital values taken: 02/28/2022  9:01 AM   Involved arm: Right   Dominant arm: Right   Patient position: Seated   Total volume: right 4355.41 mL 02/28/2022  9:01 AM   Total volume: left 3720.41 mL 02/28/2022  9:01 AM   L/R difference +635.00 mL +17.07 %          Inital values taken: 02/28/2022  9:01 AM   Involved arm: Right   Dominant arm: Right   Patient position: Seated   Total volume: right 4355.41 mL 02/28/2022  9:01 AM   Total volume: left 3720.41 mL 02/28/2022  9:01 AM   L/R difference +635.00  mL +17.07 %                                         I attest that I have reviewed the above information.  Signed: Edwena Bunde, PT  05/13/2022 10:01 AM

## 2022-05-14 NOTE — Unmapped (Signed)
[facility] Refill Coordination Note    Specialty Medication(s) to be Shipped:   Hematology/Oncology: Verzenio 100mg     Other medication(s) to be shipped: No additional medications requested for fill at this time     Laderricka Moland, DOB: 28-Aug-1990  Phone: [phone] (home)       All above HIPAA information was verified with patient.     Was a Nurse, learning disability used for this call? No    Completed refill call assessment today to schedule patient's medication shipment from the [facility] [phone]).  All relevant notes have been reviewed.     Specialty medication(s) and dose(s) confirmed: Regimen is correct and unchanged.   Changes to medications: Skyanne reports no changes at this time.  Changes to insurance: No  New side effects reported not previously addressed with a pharmacist or physician: None reported  Questions for the pharmacist: No    Confirmed patient received a Conservation officer, historic buildings and a Surveyor, mining with first shipment. The patient will receive a drug information handout for each medication shipped and additional FDA Medication Guides as required.       DISEASE/MEDICATION-SPECIFIC INFORMATION        N/A    SPECIALTY MEDICATION ADHERENCE     Medication Adherence    Patient reported X missed doses in the last month: 0  Specialty Medication: VERZENIO 100 mg Tab tablet (abemaciclib)  Patient is on additional specialty medications: No  Informant: patient                          Were doses missed due to medication being on hold? No    Verzenio 100 mg: 10 days of medicine on hand       REFERRAL TO PHARMACIST     Referral to the pharmacist: Not needed      [facility]     Shipping address confirmed in Epic.     Delivery Scheduled: Yes, Expected medication delivery date: 05/21/22.     Medication will be delivered via Next Day Courier to the prescription address in Epic WAM.    [name]   [facility] Specialty Technician

## 2022-05-15 ENCOUNTER — Ambulatory Visit: Admit: 2022-05-15 | Payer: PRIVATE HEALTH INSURANCE

## 2022-05-20 MED FILL — VERZENIO 100 MG TABLET: ORAL | 84 days supply | Qty: 168 | Fill #1

## 2022-05-21 ENCOUNTER — Ambulatory Visit: Admit: 2022-05-21 | Discharge: 2022-05-22 | Payer: PRIVATE HEALTH INSURANCE | Attending: Clinical | Primary: Clinical

## 2022-05-22 NOTE — Unmapped (Signed)
[facility]  Comprehensive Cancer Support Program       *non billable encounter*      Assessment:  Tami Gibson is a 31 y.o. female with a history of breast cancer. Referred by Luetta Nutting, PT for support navigating survivorship and long term impact of treatment (lymphedema). She describes a normal and healthy emotional response to treatment and is interested in engaging in therapy as she transitions from active treatment to survivorship.     Risk Assessment:  A suicide and violence risk assessment was performed as part of this evaluation. There is no acute risk for suicide or violence at this time.  While future psychiatric events cannot be accurately predicted, the patient does not currently require acute inpatient psychiatric care and does not currently meet [facility] involuntary commitment criteria.       Plan:  Will continue to follow for supportive counseling.       Subjective:   Patient interviewed in a private place, accompanied by no one. She shared updates since our last visit. Surgery went well and she is feeling good about taking year off from surgeries. Feels like life is back to normal which comes with some anxiety, but generally feeling good. Looking forward to summit in [facility] this spring. Provided supportive counseling, active listening, normalization, and psychoeducation. Pt engaged and open to meeting again.         Objective:    Mental Status Exam:  Speech/Language:    Normal rate, volume, tone, fluency   Mood:   euthymic   Thought process and Associations:   Logical, linear, clear, coherent, goal directed   Abnormal/psychotic thought content:     Denies SI, HI, self harm, delusions, obsessions, paranoid ideation, or ideas of reference   Perceptual disturbances:     Does not endorse auditory or visual hallucinations     Orientation:   Oriented to person, place, time, and general circumstances   Insight:     Intact   Judgment:    Intact   Impulse Control:   Intact     [name], LCSW  Comprehensive Cancer Support Program  Phone: [phone]  Pager: [phone]

## 2022-05-28 DIAGNOSIS — Z6836 Body mass index (BMI) 36.0-36.9, adult: Principal | ICD-10-CM

## 2022-05-28 DIAGNOSIS — E661 Drug-induced obesity: Principal | ICD-10-CM

## 2022-05-28 MED ORDER — WEGOVY 1.7 MG/0.75 ML SUBCUTANEOUS PEN INJECTOR
0 refills | 0 days
Start: 2022-05-28 — End: ?

## 2022-05-28 NOTE — Unmapped (Signed)
Patient is requesting the following refill  Requested Prescriptions     Pending Prescriptions Disp Refills    WEGOVY 1.7 MG/0.75 ML SUBCUTANEOUS PEN INJECTOR [Pharmacy Med Name: Wegovy 1.7 MG/0.75ML Subcutaneous Solution Auto-injector] 4 mL 0     Sig: INJECT 0.75ML (1.7MG  TOTAL) ML SUBCUTANEOUSLY  ONCE A WEEK       Order pended. Please advise. Thanks    Last OV: 11/07/2021   Next OV: Visit date not found

## 2022-05-29 MED ORDER — WEGOVY 1.7 MG/0.75 ML SUBCUTANEOUS PEN INJECTOR
0 refills | 0 days | Status: CP
Start: 2022-05-29 — End: ?

## 2022-05-30 ENCOUNTER — Institutional Professional Consult (permissible substitution): Admit: 2022-05-30 | Discharge: 2022-05-30 | Payer: PRIVATE HEALTH INSURANCE

## 2022-05-30 ENCOUNTER — Emergency Department
Admit: 2022-05-30 | Discharge: 2022-05-31 | Disposition: A | Discharge status: Home / Self Care | Payer: PRIVATE HEALTH INSURANCE | Attending: Student in an Organized Health Care Education/Training Program

## 2022-05-30 ENCOUNTER — Ambulatory Visit
Admit: 2022-05-30 | Discharge: 2022-05-30 | Payer: PRIVATE HEALTH INSURANCE | Attending: Student in an Organized Health Care Education/Training Program | Primary: Student in an Organized Health Care Education/Training Program

## 2022-05-30 ENCOUNTER — Ambulatory Visit
Admit: 2022-05-30 | Discharge: 2022-05-31 | Disposition: A | Discharge status: Home / Self Care | Payer: PRIVATE HEALTH INSURANCE | Attending: Student in an Organized Health Care Education/Training Program

## 2022-05-30 DIAGNOSIS — K625 Hemorrhage of anus and rectum: Principal | ICD-10-CM

## 2022-05-30 DIAGNOSIS — Z9889 Other specified postprocedural states: Principal | ICD-10-CM

## 2022-05-30 LAB — CBC W/ AUTO DIFF
BASOPHILS ABSOLUTE COUNT: 0 10*9/L (ref 0.0–0.1)
BASOPHILS RELATIVE PERCENT: 0.8 %
EOSINOPHILS ABSOLUTE COUNT: 0.1 10*9/L (ref 0.0–0.5)
EOSINOPHILS RELATIVE PERCENT: 1.2 %
HEMATOCRIT: 38.4 % (ref 34.0–44.0)
HEMOGLOBIN: 12.7 g/dL (ref 11.3–14.9)
LYMPHOCYTES ABSOLUTE COUNT: 1.6 10*9/L (ref 1.1–3.6)
LYMPHOCYTES RELATIVE PERCENT: 31.4 %
MEAN CORPUSCULAR HEMOGLOBIN CONC: 33.2 g/dL (ref 32.0–36.0)
MEAN CORPUSCULAR HEMOGLOBIN: 28.5 pg (ref 25.9–32.4)
MEAN CORPUSCULAR VOLUME: 85.7 fL (ref 77.6–95.7)
MEAN PLATELET VOLUME: 6.9 fL (ref 6.8–10.7)
MONOCYTES ABSOLUTE COUNT: 0.4 10*9/L (ref 0.3–0.8)
MONOCYTES RELATIVE PERCENT: 7.4 %
NEUTROPHILS ABSOLUTE COUNT: 3 10*9/L (ref 1.8–7.8)
NEUTROPHILS RELATIVE PERCENT: 59.2 %
NUCLEATED RED BLOOD CELLS: 0 /100{WBCs} (ref <=4)
PLATELET COUNT: 201 10*9/L (ref 150–450)
RED BLOOD CELL COUNT: 4.48 10*12/L (ref 3.95–5.13)
RED CELL DISTRIBUTION WIDTH: 13.8 % (ref 12.2–15.2)
WBC ADJUSTED: 5.1 10*9/L (ref 3.6–11.2)

## 2022-05-30 LAB — COMPREHENSIVE METABOLIC PANEL
ALBUMIN: 4 g/dL (ref 3.4–5.0)
ALKALINE PHOSPHATASE: 88 U/L (ref 46–116)
ALT (SGPT): 9 U/L — ABNORMAL LOW (ref 10–49)
ANION GAP: 8 mmol/L (ref 5–14)
AST (SGOT): 16 U/L (ref <=34)
BILIRUBIN TOTAL: 0.5 mg/dL (ref 0.3–1.2)
BLOOD UREA NITROGEN: 8 mg/dL — ABNORMAL LOW (ref 9–23)
BUN / CREAT RATIO: 6
CALCIUM: 9.7 mg/dL (ref 8.7–10.4)
CHLORIDE: 110 mmol/L — ABNORMAL HIGH (ref 98–107)
CO2: 25.1 mmol/L (ref 20.0–31.0)
CREATININE: 1.28 mg/dL — ABNORMAL HIGH
EGFR CKD-EPI (2021) FEMALE: 57 mL/min/{1.73_m2} — ABNORMAL LOW (ref >=60)
GLUCOSE RANDOM: 86 mg/dL (ref 70–179)
POTASSIUM: 4.4 mmol/L (ref 3.4–4.8)
PROTEIN TOTAL: 7.5 g/dL (ref 5.7–8.2)
SODIUM: 143 mmol/L (ref 135–145)

## 2022-05-30 LAB — HCG QUANTITATIVE, BLOOD: GONADOTROPIN, CHORIONIC (HCG) QUANT: <2.6 m[IU]/mL

## 2022-05-30 NOTE — Unmapped (Incomplete)
APPOINTMENTS / QUESTIONS  For NON-urgent questions regarding appointments or your surgical care, you may reach out to your provider and/or their nursing staff via MyChart. Please keep in mind that it may take up to 2-3 business days for a response.    Additionally, you may contact our clinics, Monday through Friday, 8 am to 4:30 pm    [facility] at [facility]  [address],   [place] [address]  T: [phone]  F: [phone]    [facility]  [address] [place], [place] [address]  Ground [facility]  T: [phone]  F: [phone]    [facility] at [facility]  [address] [place], [place] [address]  T: [phone]  F: [phone]    [facility] at [facility]  [address] [place], [place] [address]  Third Floor, [address]  T: [phone]   F: [phone]    Ambulatory Surgery Center  1st Floor, Ambulatory Care Center  [address]  [place], [place] [address]  T: [phone]  Website:  [facility]    Administrative Office  [address]  [place], [place] [address]  T:  [phone]  F:  [phone]      Physicians:  Epimenio Sarin MD:   Nurse:  Vista Mink BSN, RN [phone]      Tarry Kos MD:   Nurse:  [name] BSN, RN, CPSN [phone]    Calvert Cantor MD   Nurse: Depriest Milo BSN, RN [phone]     Adeyemi Clydie Braun MD:  Nurse: Jasmine December BSN, RN, [place]  [phone]    Darleene Cleaver MD:  Nurse: Venetia Maxon BSN, RN, CPSN  [phone]      Advance Practice Providers:  Ezekiel Slocumb, DNP, NP-C:    Nurse:  Stiff Milo BSN, RN [phone]    Rush Landmark, PA-C:    Nurse:  Cuppett Milo BSN, RN [phone]      Surgery Scheduling:  Kristine Royal [phone]  You will receive a phone call from the surgery schedulers regarding date for surgery in about 10 business from your appointment.    Please call one of the above numbers if you have not received a phone call 2 weeks after your appointment with the provider.        Financial Navigator:  Arlee Muslim  Hours:  7:30 am to 4 pm  P: [phone] - F: [phone]           Billing Questions/Financial Navigation:  [phone]  [facility] Customer Service Call Center:  [phone]    [facility] Contact Information:   Please call to schedule  *Radiology (CT, X-ray, Ultrasound) [phone]  *Mammography & Breast Ultrasound [phone]  *MRI [phone]  *Interventional Radiology Procedure Scheduling [phone]    AFTER HOURS/HOLIDAYS:  For emergencies after-hours (after 5pm, or weekends): call the [facility] operator [phone]) and ask to page the Plastic Surgery resident on call. You will be directed to a surgery resident who likely is not immediately aware of the details of your case, but can help you deal with any emergencies that cannot wait until regular business hours.  Please be aware that this person is responding to many in-hospital emergencies and patient issues and may not answer your phone call immediately, but will return your call as soon as possible.    Precare Location:  [employer] Pre-Procedure Services at [facility] [facility] ([facility]ly [facility])  [address]  [place], [place] [address] ([place] - old [address] near [employer])    Blood Work Location:  [address] [place] [address]. There are no suite numbers, but it is located on the first floor there. Go to the main desk and tell them you are there for walk-in labs.  Precare:   The day prior to your scheduled surgery, Pre-Care will call you with instructions.  If you have not heard from them by 4PM, and would like to check on the status of your surgery, please call:  [facility]: [phone]  [facility]: [phone]  [facility]:  [phone]    Insurance Denials:  All appeal initiation needs to be started by the patient.  We do not appeal insurance denials.     Weather Hotline:  [phone]    FLMA forms and paperwork:  Please allow  a 2 week turn around for forms to be completed and faxed.

## 2022-05-30 NOTE — Unmapped (Signed)
Urgent care or appointment with available provider next week?

## 2022-05-30 NOTE — Unmapped (Signed)
ASSESSMENT/PLAN:     Pt with rectal bleeding and abdominal pain. Pt sent to the ED for further evaluation and management.      Diagnosis ICD-10-CM Associated Orders   1. Rectal bleeding  K62.5         Requested Prescriptions      No prescriptions requested or ordered in this encounter     Instructions about new medications and side effects provided.  If any changes in chronic medications then new reconciled medication list is given to patient.     CHIEF COMPLAINT:     Chief Complaint   Patient presents with    Rectal Bleeding     Blood in stool on 12/25 and 12/28. Having some back and abdominal pain. Breast Cancer patient. No constipation.        SUBJECTIVE/HPI:   31 y.o. Female presents with rectal bleeding and abdominal pain for the past several days. Pt rates pain 4/10. Pt denies fever.    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    [facility]    Gestational hypertension 01/30/2017    end of pregnancy had hypertension, was induced    Headache     migraine occasionally    Hypertension     during week 37 week, pt induced and had C/S due to hypertension    IIH (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/2015    followed by ophthalmologist    Kidney stone     Malignant neoplasm of right breast in female, 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/30/2019    Pyelonephritis 09/2016    Kidney stones    Urinary tract infection     Had 5-6 in Lifetime, esp high school and college age    Varicella     during childhood    Vitamin B 12 deficiency      No Known Allergies  Social History     Socioeconomic History    Marital status: Married     Spouse name: Orthoptist    Number of children: 0    Years of education: None    Highest education level: None   Occupational History    Occupation: Recreational therapist    Tobacco Use    Smoking status: Never     Passive exposure: Never    Smokeless tobacco: Never   Vaping Use    Vaping Use: Never used   Substance and Sexual Activity    Alcohol use: Not Currently    Drug use: No    Sexual activity: Yes     Partners: Male     Birth control/protection: Pill   Other Topics Concern    Exercise Yes    Living Situation No   Social History Narrative    PRECONCEPTUAL ASSESSMENT:        Tami Gibson is a 31 y.o. Caucasian female    Caffeine:denies use    Cats: no    Exercise: not active    Diet: balanced    Family hx of of birth defects, chromosomal abnormality, intellectual disability, developmental delay: no.            PARTNER HISTORY:     Domingo Dimes is a 74 y.o.  Caucasian female.    Occupation:  Immunologist; [employer]    He has fathered children:  no    He is healthy with no chronic illness    Family history of  birth defects, chromosomal abnormality, intellectual disability, or developmental delay: no.              Social Determinants of Health     Financial Resource Strain: Low Risk  (04/21/2020)    Overall Financial Resource Strain (CARDIA)     Difficulty of Paying Living Expenses: Not very hard   Food Insecurity: No Food Insecurity (04/21/2020)    Hunger Vital Sign     Worried About Running Out of Food in the Last Year: Never true     Ran Out of Food in the Last Year: Never true   Transportation Needs: No Transportation Needs (04/21/2020)    PRAPARE - Therapist, art (Medical): No     Lack of Transportation (Non-Medical): No     Family History   Problem Relation Age of Onset    Cancer Maternal Aunt 45        breast Ca    Hypertension Father     Other Father 65        Benign brain tumor    Hypothyroidism Mother     Cancer Paternal Aunt         Breast Ca dx in 93s    ALS Maternal Aunt     Ovarian cancer Maternal Grandmother 77    No Known Problems Maternal Grandfather     Cancer Paternal Grandmother         Unknown Cancer    No Known Problems Paternal Grandfather     No Known Problems Maternal Aunt     No Known Problems Maternal Aunt     Breast cancer Maternal Cousin         Dx in 58s    No Known Problems Paternal Aunt     No Known Problems Daughter     No Known Problems Daughter     No Known Problems Other     BRCA 1/2 Neg Hx     Colon cancer Neg Hx     Endometrial cancer Neg Hx        ROS:   Review of Systems   Constitutional:  Negative for fever.   HENT:  Negative for congestion, ear pain and sore throat.    Eyes:  Negative for redness.   Respiratory:  Negative for cough and shortness of breath.    Cardiovascular:  Negative for chest pain.   Gastrointestinal:  Positive for abdominal pain and blood in stool. Negative for diarrhea, nausea and vomiting.   Genitourinary:  Negative for dysuria, frequency and urgency.   Musculoskeletal:  Negative for myalgias.   Skin:  Negative for rash.     See Subjective/HPI  Medications, Allergies and Problem List personally reviewed in Epic today    OBJECTIVE:          05/30/22 1421   BP: 89/67   Pulse: 96   Resp: 19   Temp: 36.7 ??C (98.1 ??F)   Weight: 90.7 kg (200 lb)   Height: 167.6 cm (5' 6)   PainSc: 4    PainLoc: Abdomen     Physical Exam  Vitals reviewed.   Constitutional:       General: She is not in acute distress.     Appearance: Normal appearance.   HENT:      Head: Normocephalic.      Right Ear: External ear normal.  Left Ear: External ear normal.      Nose: Nose normal.      Mouth/Throat:      Pharynx: Oropharynx is clear.   Eyes:      Conjunctiva/sclera: Conjunctivae normal.   Cardiovascular:      Rate and Rhythm: Normal rate and regular rhythm.      Heart sounds: Normal heart sounds.   Pulmonary:      Effort: Pulmonary effort is normal. No respiratory distress.      Breath sounds: Normal breath sounds.   Abdominal:      General: Abdomen is flat. Bowel sounds are normal. There is no distension.      Palpations: Abdomen is soft.      Tenderness: There is abdominal tenderness. There is no guarding or rebound.   Skin:     General: Skin is warm and dry.   Neurological: Mental Status: She is alert.          No results found.     LABS/X-RAYS/EKG/MEDS:       No results found for this visit on 05/30/22.    No results found.      Transferred to ED Patient Disposition    Indication for Disposition: Requires a higher level of care    Chief Complaint: rectal bleeding      Chestine Spore given instructions to proceed to ED via personal vehicle; Location: [place] due to Other      Report called to: charge nurse           A copy of these instructions have been given to the patient or responsible adult who demonstrated the ability to learn, asked appropriate questions, and verbalized understanding of the plan of care.  There were no barriers to learning identified.    Please take the time to sign up for a MyChart Account. See sign up information in your After Visit Summary. You will be able to view lab results, make appointments, communicate with providers, and much more.

## 2022-05-30 NOTE — Unmapped (Signed)
Pt states she is having generlaized abd pain/craamping with loose bloody stools about 20 since 2am this morning states she went to famil PCP and referred to the ED, post op breast reconstruction, breast CA pt.

## 2022-05-30 NOTE — Unmapped (Signed)
BLood isn't much, but due to the :intense pain an the blood, she should go to urgent care today.    Berdine Addison, M.D.     [facility] Internal Medicine at [facility]   [address]  [address]  [place], [place]  [address]  [phone]

## 2022-05-31 LAB — CBC W/ AUTO DIFF
BASOPHILS ABSOLUTE COUNT: 0 10*9/L (ref 0.0–0.1)
BASOPHILS RELATIVE PERCENT: 0.6 %
EOSINOPHILS ABSOLUTE COUNT: 0.1 10*9/L (ref 0.0–0.5)
EOSINOPHILS RELATIVE PERCENT: 1.6 %
HEMATOCRIT: 39.4 % (ref 34.0–44.0)
HEMOGLOBIN: 12.8 g/dL (ref 11.3–14.9)
LYMPHOCYTES ABSOLUTE COUNT: 1.7 10*9/L (ref 1.1–3.6)
LYMPHOCYTES RELATIVE PERCENT: 30.5 %
MEAN CORPUSCULAR HEMOGLOBIN CONC: 32.6 g/dL (ref 32.0–36.0)
MEAN CORPUSCULAR HEMOGLOBIN: 28.9 pg (ref 25.9–32.4)
MEAN CORPUSCULAR VOLUME: 88.6 fL (ref 77.6–95.7)
MEAN PLATELET VOLUME: 7.5 fL (ref 6.8–10.7)
MONOCYTES ABSOLUTE COUNT: 0.4 10*9/L (ref 0.3–0.8)
MONOCYTES RELATIVE PERCENT: 7.2 %
NEUTROPHILS ABSOLUTE COUNT: 3.3 10*9/L (ref 1.8–7.8)
NEUTROPHILS RELATIVE PERCENT: 60.1 %
NUCLEATED RED BLOOD CELLS: 0 /100{WBCs} (ref <=4)
PLATELET COUNT: 210 10*9/L (ref 150–450)
RED BLOOD CELL COUNT: 4.44 10*12/L (ref 3.95–5.13)
RED CELL DISTRIBUTION WIDTH: 14.2 % (ref 12.2–15.2)
WBC ADJUSTED: 5.6 10*9/L (ref 3.6–11.2)

## 2022-05-31 MED ORDER — AMOXICILLIN 875 MG-POTASSIUM CLAVULANATE 125 MG TABLET
ORAL_TABLET | Freq: Two times a day (BID) | ORAL | 0 refills | 10 days | Status: CP
Start: 2022-05-31 — End: 2022-06-10

## 2022-05-31 MED ADMIN — amoxicillin-clavulanate (AUGMENTIN) 875-125 mg per tablet 1 tablet: 1 | ORAL | @ 08:00:00 | Stop: 2022-05-31

## 2022-05-31 MED ADMIN — iohexol (OMNIPAQUE) 350 mg iodine/mL solution 100 mL: 100 mL | INTRAVENOUS | @ 06:00:00 | Stop: 2022-05-31

## 2022-05-31 MED ADMIN — lactated ringers bolus 1,000 mL: 1000 mL | INTRAVENOUS | @ 06:00:00 | Stop: 2022-05-31

## 2022-05-31 NOTE — Unmapped (Signed)
ULTRASOUND PIV PROCEDURE NOTE    Indications:   Poor venous access.    Ultrasound guidance was necessary to obtain access.     Procedure Details:  Identity of the patient was confirmed via name, medical record number and date of birth. The availability of the correct equipment was verified.    The vein was identified and measured for ultrasound catheter insertion.       Vein measurement (without tourniquet):   .47   cm   A(n) 20 gauge 1.75 catheter was selected based on the recommendations below:    [facility] Catheter/Vein Ratio Guidelines    Chart to determine PIV catheter size/length to use based on vein diameter and depth   Catheter Gauge Size (g)  22g 20g 18g   Catheter length (inches)  1.75 1.75 1.75   Catheter diameter measurement (mm) 0.9 mm 1.1 mm 1.3 mm          Minimum required vein diameter       Sonosite (cm)  0.27 cm 0.33 cm 0.39 cm          Maximum vein depth  1.25 cm 1.25 cm 1.25 cm          The field was prepared with necessary supplies and equipment.  Probe cover and sterile gel utilized. Insertion site was prepped with chlorhexidineand allowed to dry.  The catheter extension was primed with normal saline.  The [place] PIV was placed in the LAC with 1attempt(s). See LDA for additional details.    Catheter aspirated, 10 mL blood return present. The catheter was then flushed with 10 mL of normal saline. Insertion site cleansed, and dressing applied per manufacturer guidelines. The catheter was inserted with difficulty due to poor vasculature by Brynda Greathouse, RN.    Thank you,     Brynda Greathouse, RN    Ultrasound Resource Nurse    Workup / Procedure Time:  30 minutes

## 2022-05-31 NOTE — Unmapped (Signed)
Vista Mink RN dictating for telephone nurse visit.    CC:      HPI:  Tami Gibson is a 31 y.o. female who presents for post op check. She has a history of right breast IDC +/+/- s/p NACT and more recently s/p B SSM with right TAD (Dr. Teola Bradley) and immediate reconstruction with tissue expander placement on 04/20/20. Patients final pathology revealed residual disease with 2/3 positive lymph nodes and she underwent ALND with Dr. Teola Bradley on 05/07/20. She underwent adjuvant radiation (completed 08/06/20). She was originally interested in DIEP flap reconstruction but did not meet the BMI criteria. She underwent tissue expander to implant exchange (SCX 650 ml) with fat grafting to the bilateral breasts from the abdomen on 08/13/20. Most recently she underwent liposuction from abdomen and bilateral flank/hips, fat grafting to bilateral breast, R breast radial scoring, and R breast implant exchange to SCX 700cc on 04/16/2022.      Since last being seen, she has overall been doing well but reports bloody stool over the past two days. She is scheduling an appointment with Dr. Elana Alm regarding this. She has continued with the nighttime compression. She reports dry skin on the right side and will use Eucerin, Cerave, or Aveeno to treat. Her restrictions are lifted and she can ease back into regular activity. She asked for PT appointments to be added to her FMLA and resubmit and she was told PT will have to submit that information although we can add her most recent follow up appointments with [place] per her request. RN will send. She is happy with her outcome and feeling well.          ROS:   Otherwise, 12 point review of systems was completed and is negative except as per HPI.       Vitals:   ?  Physical Exam:   Gen: AA in NAD  HEENT: NCAT, EOMI, PERRL, Non icteric sclerae, MMM  CV: RRR  RESP: quiet respirations on room air  EXT: warm and well perfused  BREASTS: Bilateral breasts surgically absent with implants in place. Bilateral incisions are well approximated. No erythema or drainage. No undrained fluid collections. Minimal resolving bruising.  Right radiated breast with higher footprint  ABD: Liposuction sites well approximated. No obvious contour irregularity.   ?  Impression and Plan:  Tami Gibson is here today for post op check after liposuction from abdomen and bilateral flank/hips, fat grafting to bilateral breast, R breast radial scoring, and R breast implant exchange to SCX 700cc on 04/16/2022. Doing well.      - Restrictions are lifted, ease into regular activity  - Wait until 4 weeks to use lymphedema pump  - Treat right side dry skin with Eucerin or Cerave   - RN will update FMLA paperwork with most recent post-ops and re-send  - Follow up in 3-6 months with Dr. Lafayette Dragon     Dr. Lafayette Dragon was not present but formulated the above plan of care.

## 2022-05-31 NOTE — Unmapped (Signed)
[facility] Emergency Department Provider Note         ED Clinical Impression     Final diagnoses:   Diverticulitis (Primary)   Gastrointestinal hemorrhage, unspecified gastrointestinal hemorrhage type       Presenting History and MDM     HPI    May 30, 2022 11:24 PM   Tami Gibson is a 31 y.o. 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 presenting for evaluation of bright red blood per rectum and abdominal pain.  She first noted bright red blood yesterday and it has become persistent.  Initially she was still having bowel movements with blood mixed in but is now exclusively passing blood.  She has not felt any hemorrhoids.  Her abdominal pain started suprapubic and is now spread throughout the abdomen.  No fever or chills.  No nausea or vomiting. Currently on abemaciclib and letrozole for breast ca.     On physical exam vital signs are within normal limits.  Abdomen is tender without rebound or guarding worst in the suprapubic.  Rectal exam without external hemorrhoids.  No large internal hemorrhoids palpated.      Plan for basic labs, CTA GI bleed, repeat CBC, IV fluids, and reevaluation.      Initial impression and pertinent physical exam:      BP 127/92  - Pulse 74  - Temp 37.3 ??C (99.1 ??F) (Oral)  - Resp 22  - Ht 167.6 cm (5' 6)  - Wt 90.7 kg (200 lb)  - LMP 12/15/2019 Comment: luoprone injection - SpO2 98%  - BMI 32.28 kg/m??     On initial evaluation, VS stable. Abd ttp diffusely w/o r/g. Soft. Rectal without large hemorrhoids.     Diagnostic workup as below.     Orders Placed This Encounter   Procedures    CTA Abdomen Pelvis Gi Bleed    CBC w/ Differential    Comprehensive Metabolic Panel    hCG, Quantitative, Blood    CBC w/ Differential    Type and Screen    ABO/RH     Medications as below:  Medication during hospitalization         amoxicillin-clavulanate (AUGMENTIN) 875-125 mg per tablet 1 tablet Admin Date  05/31/2022  02:46 Action  Given Dose  1 tablet Route  Oral Site          iohexol (OMNIPAQUE) 350 mg iodine/mL solution 100 mL Admin Date  05/31/2022  01:21 Action  Given Dose  100 mL Route  Intravenous Site          lactated ringers bolus 1,000 mL Admin Date  05/31/2022  00:47 Action  New Bag Dose  1,000 mL Route  Intravenous Site                MDM:   Concern for lower GI bleed, diverticulosis, bleeding internal hemorrhoid, mucosal bleeding, chemo reaction including thrombocytopenia.    Will reassess as we get results and update below    ED Course as of 06/01/22 0534   Sat May 31, 2022   0003 Creatinine(!): 1.28  Baseline 1-1.08, will give fluids   0003 CBC w/ Differential:    WBC 5.1   RBC 4.48   HGB 12.7   HCT  38.4   MCV 85.7   MCH 28.5   MCHC 33.2   RDW 13.8   MPV 6.9   Platelet 201   nRBC 0   Neutrophils % 59.2   Lymphocytes % 31.4   Monocytes % 7.4   Eosinophils % 1.2   Basophils % 0.8   Absolute Neutrophils 3.0   Absolute Lymphocytes 1.6   Absolute Monocytes  0.4   Absolute Eosinophils 0.1   Absolute Basophils  0.0  No anemia or thrombocytopenia   0004 HGB: 12.7  Will repeat at this time   0045 HGB: 12.8  Repeat increased from 12.7->12.8   0218 CTA Abdomen Pelvis Gi Bleed  IMPRESSION:     Circumferential wall thickening involving a long segment of the rectosigmoid colon with surrounding free fluid, favored to represent acute, uncomplicated diverticulitis. No fluid collections.     No evidence of active GI bleed.       Plan for antibiotics.  I believe she is stable for discharge.    Discussion of Management with other Physicians, QHP, or Appropriate Source: Discussion with other professionals: None  Independent Interpretation of Studies: CTA GIB  External Records Reviewed: prior notes regarding surgery and urgent care  Escalation of Care, Consideration of Admission/Observation/Transfer: Escalation of Care, Consideration of Admission/Observation/Transfer: Stable for discharge  Social determinants that significantly affected care: n/a  Prescription drug(s) considered but not prescribed: n/a  Diagnostic tests considered but not performed: n/a    Rules Applied: n/a      Additional Medical Decision Making     I have reviewed the vital signs and the nursing notes. Labs and radiology results that were available during my care of the patient were independently reviewed by me and considered in my medical decision making.   I independently visualized the EKG tracing if performed  I independently visualized the radiology images if performed  I reviewed the patient's prior medical records if available.  Additional history obtained from family if available    Other History     CHIEF COMPLAINT:   Chief Complaint   Patient presents with    GI Bleeding       PAST MEDICAL HISTORY/PAST SURGICAL HISTORY:   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    [facility]    Gestational hypertension 01/30/2017    end of pregnancy had hypertension, was induced    Headache     migraine occasionally    Hypertension     during week 37 week, pt induced and had C/S due to hypertension    IIH (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/2015    followed by ophthalmologist    Kidney stone     Malignant neoplasm of right breast in female, 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/30/2019    Pyelonephritis 09/2016    Kidney stones    Urinary tract infection     Had 5-6 in Lifetime, esp high school and college age    Varicella     during childhood    Vitamin B 12 deficiency        Past Surgical History:   Procedure Laterality Date    BREAST BIOPSY Right     IDC    CESAREAN SECTION  01/30/2017    breech    CHEMOTHERAPY  IR INSERT PORT AGE GREATER THAN 5 YRS  12/08/2019    IR INSERT PORT AGE GREATER THAN 5 YRS 12/08/2019 Jobe Gibbon, MD IMG VIR [facility]    LUMBAR PUNCTURE DIAGNOSTIC [facility] HISTORICAL RESULT)  01/2015    to relieve ICP    PR BX/REMV,LYMPH NODE,DEEP AXILL Right 04/20/2020    Procedure: BX/EXC LYMPH NODE; OPEN, DEEP AXILRY NODE;  Surgeon: Moss Mc, MD;  Location: MAIN OR [facility];  Service: Surgical Oncology Breast    PR CESAREAN DELIVERY ONLY N/A 01/30/2017    Procedure: CESAREAN DELIVERY ONLY;  Surgeon: Asher Muir, MD;  Location: L&D C-SECTION OR SUITES [facility];  Service: Maternal-Fetal Medicine    PR CESAREAN DELIVERY ONLY N/A 02/17/2019    Procedure: CESAREAN DELIVERY ONLY;  Surgeon: Lonny Prude, MD;  Location: L&D C-SECTION OR SUITES [facility];  Service: Maternal-Fetal Medicine    PR GRAFTING OF AUTOLOGOUS FAT BY LIPO 50 CC OR LESS Bilateral 04/16/2022    Procedure: GRAFTING OF AUTOLOGOUS FAT HARVESTED BY LIPOSUCTION TECHNIQUE TO TRUNK, BREASTS, SCALP, ARMS, AND/OR LEGS; 50 CC OR LESS INJECTATE;  Surgeon: Arsenio Katz, MD;  Location: MAIN OR [place];  Service: Plastics    PR IMPLNT BIO IMPLNT FOR SOFT TISSUE REINFORCEMENT Bilateral 04/20/2020    Procedure: IMPLANTATION BIOLOGIC IMPLANT(EG, ACELLULAR DERMAL MATRIX) FOR SOFT TISSUE REINFORCEMENT(EG, BREAST, TRUNK);  Surgeon: Arsenio Katz, MD;  Location: MAIN OR [facility];  Service: Plastics    PR INSJ/RPLCMT BREAST IMPLANT SEP DAY MASTECTOMY Right 04/16/2022    Procedure: INSERTION OR REPLACEMENT OF BREAST IMPLANT ON SEPERATE DAY FROM MASTECTOMY;  Surgeon: Arsenio Katz, MD;  Location: MAIN OR [place];  Service: Plastics    PR INTRAOPERATIVE SENTINEL LYMPH NODE ID W DYE INJECTION Right 04/20/2020    Procedure: INTRAOPERATIVE IDENTIFICATION SENTINEL LYMPH NODE(S) INCLUDE INJECTION NON-RADIOACTIVE DYE, WHEN PERFORMED;  Surgeon: Moss Mc, MD;  Location: MAIN OR [place];  Service: Surgical Oncology Breast    PR INTRAOPERATIVE SENTINEL LYMPH NODE ID W DYE INJECTION Right 05/07/2020    Procedure: INTRAOPERATIVE IDENTIFICATION SENTINEL LYMPH NODE(S) INCLUDE INJECTION NON-RADIOACTIVE DYE, WHEN PERFORMED;  Surgeon: Moss Mc, MD;  Location: MAIN OR [place];  Service: Surgical Oncology Breast    PR MASTECTOMY, SIMPLE, COMPLETE Bilateral 04/20/2020    Procedure: MASTECTOMY, SIMPLE, COMPLETE;  Surgeon: Moss Mc, MD;  Location: MAIN OR [facility];  Service: Surgical Oncology Breast    PR REMOVE ARMPITS LYMPH NODES COMPLT Right 05/07/2020    Procedure: AXILLARY LYMPHADENECTOMY; COMPLETE;  Surgeon: Moss Mc, MD;  Location: MAIN OR [facility];  Service: Surgical Oncology Breast    PR REPLACEMENT TISSUE EXPANDER W/PERMANENT IMPLANT Bilateral 08/13/2021    Procedure: REPLACEMENT OF TISSUE EXPANDER WITH PERMANENT IMPLANT;  Surgeon: Arsenio Katz, MD;  Location: MAIN OR [facility];  Service: Plastics    PR REVISION OF RECONSTRUCTED BREAST Bilateral 04/16/2022    Procedure: REVISION OF RECONSTRUCTED BREAST;  Surgeon: Arsenio Katz, MD;  Location: MAIN OR [facility];  Service: Plastics    PR TISSUE EXPANDER PLACEMENT BREAST RECONSTRUCTION Bilateral 04/20/2020    Procedure: TISSUE EXPANDER PLACEMENT IN BREAST RECONSTRUCTION, INCLUDING SUBSEQUENT EXPANSION(S);  Surgeon: Arsenio Katz, MD;  Location: MAIN OR [place];  Service: Plastics    TONSILECTOMY, ADENOIDECTOMY, BILATERAL MYRINGOTOMY AND TUBES      WISDOM TOOTH EXTRACTION  2004       MEDICATIONS:   No current facility-administered medications for this encounter.    Current Outpatient Medications:     abemaciclib (VERZENIO) 100 mg Tab  tablet, Take 1 tablet (100 mg total) by mouth Two (2) times a day. Take with or without food., Disp: 56 tablet, Rfl: 5    acetaminophen (TYLENOL) 325 MG tablet, Take 2 tablets (650 mg total) by mouth every six (6) hours as needed for pain., Disp: , Rfl:     amoxicillin-clavulanate (AUGMENTIN) 875-125 mg per tablet, Take 1 tablet by mouth two (2) times a day for 10 days., Disp: 20 tablet, Rfl: 0    calcium carbonate (TUMS) 200 mg calcium (500 mg) chewable tablet, Chew 1 tablet (200 mg of elem calcium total) daily., Disp: , Rfl:     cetirizine (ZYRTEC) 10 MG tablet, Take 1 tablet (10 mg total) by mouth daily., Disp: 30 tablet, Rfl: 2    cetirizine (ZYRTEC) 5 MG chewable tablet, Chew 1 tablet (5 mg total) daily. (Patient not taking: Reported on 05/30/2022), Disp: , Rfl:     cholecalciferol, vitamin D3-25 mcg, 1,000 unit,, 25 mcg (1,000 unit) capsule, Take 1 capsule (25 mcg total) by mouth., Disp: , Rfl:     cyanocobalamin 100 MCG tablet, Take 1 tablet (100 mcg total) by mouth daily. (Patient not taking: Reported on 05/30/2022), Disp: , Rfl:     cyanocobalamin, vitamin B-12, 1000 MCG tablet, Take 1 tablet (1,000 mcg total) by mouth daily., Disp: , Rfl:     docusate sodium (COLACE) 100 MG capsule, Take 1 capsule (100 mg total) by mouth nightly as needed (PRN)., Disp: , Rfl:     escitalopram oxalate (LEXAPRO) 20 MG tablet, Take 1 tablet (20 mg total) by mouth daily., Disp: 90 tablet, Rfl: 3    letrozole (FEMARA) 2.5 mg tablet, Take 1 tablet (2.5 mg total) by mouth daily., Disp: 90 tablet, Rfl: 3    leuprolide (LUPRON) 3.75 mg injection, Inject 3.75 mg into the muscle., Disp: , Rfl:     omeprazole (PRILOSEC) 20 MG capsule, Take 1 capsule (20 mg total) by mouth daily., Disp: 90 capsule, Rfl: 0    omeprazole (PRILOSEC) 20 MG capsule, Take 1 capsule by mouth once daily, Disp: 90 capsule, Rfl: 0    ondansetron (ZOFRAN-ODT) 4 MG disintegrating tablet, DISSOLVE 1 TABLET IN MOUTH EVERY 8 HOURS AS NEEDED FOR NAUSEA, Disp: 18 tablet, Rfl: 0    rimegepant (NURTEC ODT) 75 mg TbDL, Take 1 tablet (75 mg total) by mouth., Disp: , Rfl:     rimegepant (NURTEC ODT) 75 mg TbDL, Take 1 tablet (75 mg total) by mouth daily as needed., Disp: , Rfl:     SUMAtriptan (IMITREX) 100 MG tablet, TAKE ONE TABLET BY MOUTH AT ONSET OF MIGRAINE FOR UP TO 1 DOSE . IF SYMPTOMS PERSIST A SECOND DOSE MAY BE TAKEN IN 2 HOURS IF NEEDED, Disp: , Rfl:     SUMAtriptan (IMITREX) 25 MG tablet, Take 1 tablet (25 mg total) by mouth., Disp: , Rfl:     topiramate (TOPAMAX) 25 MG tablet, TAKE 3 TABLETS BY MOUTH ONCE DAILY, Disp: , Rfl:     traZODone (DESYREL) 50 MG tablet, TAKE 1/2 TO 1 (ONE-HALF TO ONE) TABLET BY MOUTH NIGHTLY AS NEEDED FOR SLEEP, Disp: 30 tablet, Rfl: 0    vitamin E-134 mg, 200 UNIT, 134 mg (200 UNIT) capsule, Take 1 capsule (134 mg total) by mouth daily., Disp: , Rfl:     WEGOVY 1.7 MG/0.75 ML SUBCUTANEOUS PEN INJECTOR, INJECT 0.75ML (1.7MG  TOTAL) ML SUBCUTANEOUSLY  ONCE A WEEK, Disp: 4 mL, Rfl: 0    Facility-Administered Medications Ordered in Other Encounters:  leuprolide (LUPRON) 3.75 mg injection, , , ,     ALLERGIES:   Patient has no known allergies.    SOCIAL HISTORY:   Social History     Tobacco Use    Smoking status: Never     Passive exposure: Never    Smokeless tobacco: Never   Substance Use Topics    Alcohol use: Not Currently       FAMILY HISTORY:  Family History   Problem Relation Age of Onset    Cancer Maternal Aunt 45        breast Ca    Hypertension Father     Other Father 66        Benign brain tumor    Hypothyroidism Mother     Cancer Paternal Aunt         Breast Ca dx in 35s    ALS Maternal Aunt     Ovarian cancer Maternal Grandmother 37    No Known Problems Maternal Grandfather     Cancer Paternal Grandmother         Unknown Cancer    No Known Problems Paternal Grandfather     No Known Problems Maternal Aunt     No Known Problems Maternal Aunt     Breast cancer Maternal Cousin         Dx in 32s    No Known Problems Paternal Aunt     No Known Problems Daughter     No Known Problems Daughter     No Known Problems Other     BRCA 1/2 Neg Hx     Colon cancer Neg Hx     Endometrial cancer Neg Hx           Review of Systems    A 10 point review of systems was performed and is negative other than positive elements noted in HPI     Physical Exam     Constitutional: Alert and oriented. No acute distress.  Eyes: Conjunctivae are normal.  HENT: Normocephalic and atraumatic. No congestion. Moist mucous membranes.   Cardiovascular: Rate as above, regular rhythm. Normal and symmetric distal pulses. Brisk capillary refill. Normal skin turgor.  Respiratory: Normal respiratory effort. No audible wheezing  Gastrointestinal: Soft, non-distended, non-tender.  Genitourinary: No obvious hemorrhoids external or internal.  Musculoskeletal: Non-tender with normal range of motion in all extremities.  Neurologic: Normal speech and language. No gross focal neurologic deficits are appreciated. Patient is moving all extremities equally, face is symmetric at rest and with speech.  Skin: Skin is warm, dry and intact. No rash noted.  Psychiatric: Mood and affect are normal. Speech and behavior are normal.    Radiology     CTA Abdomen Pelvis Gi Bleed   Final Result      Circumferential wall thickening involving a long segment of the rectosigmoid colon with surrounding free fluid, favored to represent acute, uncomplicated diverticulitis. No fluid collections.      No evidence of active GI bleed.          Labs     Labs Reviewed   COMPREHENSIVE METABOLIC PANEL - Abnormal; Notable for the following components:       Result Value    Chloride 110 (*)     BUN 8 (*)     Creatinine 1.28 (*)     eGFR CKD-EPI (2021) Female 32 (*)     ALT 9 (*)     All other components within normal limits   CBC W/ DIFFERENTIAL  Narrative:     The following orders were created for panel order CBC w/ Differential.                  Procedure                               Abnormality         Status                                     ---------                               -----------         ------                                     CBC w/ Differential[[id number]] Final result                                                 Please view results for these tests on the individual orders.   HCG QUANTITATIVE, BLOOD    Narrative:     Non-Pregnant Females: 1.5 - 4.2 mIU/mL                                    Post and Peri-menopausal Females: 1.8 - 10.1 mIU/mL                                    During pregnancy, hCG increases exponentially for about 8-10 weeks after conception and begins falling at about 12 weeks. Week-to-week hCG levels during pregnancy show significant overlap and are not a reliable means of determining gestational age. Post and peri-menopausal females may have low levels of detectable hCG due to pituitary production of hCG; in such cases correlation with serum FSH may be helpful. Test interference may occur from heterophilic antibodies or high levels of biotin.                                       CBC W/ DIFFERENTIAL    Narrative:     The following orders were created for panel order CBC w/ Differential.                  Procedure                               Abnormality         Status                                     ---------                               -----------         ------  CBC w/ [id number]]                             Final result                                                 Please view results for these tests on the individual orders.   TYPE AND SCREEN   ABO/RH   CBC W/ AUTO DIFF   CBC W/ AUTO DIFF       Please note- This chart has been created using [employer]. Chart creation errors have been sought, but may not always be located and such creation errors, especially pronoun confusion, do NOT reflect on the standard of medical care.     Penni Bombard, MD  06/01/22 [phone]

## 2022-06-03 ENCOUNTER — Ambulatory Visit
Admit: 2022-06-03 | Payer: PRIVATE HEALTH INSURANCE | Attending: Rehabilitative and Restorative Service Providers" | Primary: Rehabilitative and Restorative Service Providers"

## 2022-06-03 ENCOUNTER — Ambulatory Visit
Admit: 2022-06-03 | Discharge: 2022-06-27 | Payer: PRIVATE HEALTH INSURANCE | Attending: Rehabilitative and Restorative Service Providers" | Primary: Rehabilitative and Restorative Service Providers"

## 2022-06-03 NOTE — Unmapped (Signed)
[facility] THERAPY SERVICES [place]  OUTPATIENT PHYSICAL THERAPY  06/03/2022  Note Type: Treatment Note       Patient Name: Tami Gibson  Date of Birth:15-Jul-1990  Diagnosis:   Encounter Diagnosis   Name Primary?    Lymphedema syndrome, postmastectomy Yes     Referring MD:  [name], MD    Date of Onset of Impairment-No date available  Date PT Care Plan Established or Reviewed-No date available  Date PT Treatment Started-No date available   Plan of Care Effective Date:     Oncology History Overview Note   32 year old female with recently self palpated right breast lump. A targeted right breast ultrasound on 11/15/2019 showed an irregular mass with spiculated margins at the 7:00 position, 6 cm from the nipple, measuring 1.4 x 1.2 x 1.0 cm. There were 2 abnormal level 1 axillary lymph nodes measuring 1.4 x 1.3 cm and 1.2 x 0.9 cm. Additionally, there was a high level 1/level 2 axillary lymph node, measuring 2.1 x 1.3 cm with an effaced fatty hilum.      Malignant neoplasm of right breast in female, estrogen receptor positive (CMS-HCC)   11/09/2019 Initial Diagnosis    Malignant neoplasm of right breast in female, estrogen receptor positive (CMS-HCC)     11/15/2019 Biopsy    Invasive ductal carcinoma  - Nottingham combined histologic grade: 1  ER 91-100%, PR 91-100%, HER2 negative by IHC    Lymph node, right axilla, core biopsy  - Lymph node positive for metastatic carcinoma,      12/02/2019 -  Cancer Staged    Staging form: Breast, AJCC 8th Edition  - Clinical: Stage IB (cT1c, cN1, cM0, G1, ER+, PR+, HER2-) - Signed by [name], MD on 12/02/2019       12/09/2019 - 03/22/2020 Chemotherapy    OP BREAST AC (Dose Dense) q2W X 4 CYCLES, THEN PACLITAXEL (DOSE DENSE) Q2W x 4 CYCLES  DOXOrubicin 60 mg/m2 IV on day 1, cyclophosphamide 600 mg/m2 IV on day 1, every 2 weeks for 4 cycles, then PACLitaxel 175 mg/m2 every 2 weeks for 4 cycles      04/20/2020 Surgery    Breast, right, simple mastectomy  -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 Extracapsular extension is identified      Breast, left, simple mastectomy  -Benign breast tissue with no atypia, in situ or invasive carcinoma identified     04/20/2020 -  Cancer Staged    Staging form: Breast, AJCC 8th Edition  - Pathologic stage from 04/20/2020: No Stage Recommended (ypT1c, pN1a, cM0, G2, ER+, PR+, HER2-) - Signed by [name], MD on 04/25/2020       05/07/2020 Surgery    Lymph nodes, right axillary, dissection  -One of sixteen lymph nodes involved by metastatic adenocarcinoma (1/16)  -Metastatic focus measures 3 mm in greatest dimension  -No extracapsular extension is identified     06/28/2020 - 08/06/2020 Radiation    The total radiation dose will be 5000 cGy at 200 cGy/fraction for a total of 25 fractions, treated once a day     08/13/2020 Endocrine/Hormone Therapy    Letrozole 2.5mg  +Ovarian suppression (monthly lupron)     Malignant neoplasm of right breast in female, estrogen receptor positive (CMS-HCC)   05/07/2020 Surgery    Lymph nodes, right axillary, dissection  -One of sixteen lymph  nodes involved by metastatic adenocarcinoma (1/16)  -Metastatic focus measures 3 mm in greatest dimension  -No extracapsular extension is identified     06/13/2020 -  Radiation    Radiation Therapy Treatment Details (Noted on 06/13/2020)  Site: Right Breast - Overlapping sites  Technique: 3D CRT  Goal: No goal specified  Planned Treatment Start Date: No planned start date specified     06/21/2020 -  Radiation    Radiation Therapy Treatment Details (Noted on 06/21/2020)  Site: Right Breast - Overlapping sites  Technique: 3D CRT  Goal: No goal specified  Planned Treatment Start Date: No planned start date specified           Assessment/Plan:    Assessment  Assessment details:    Myofascial restriction and muscle tightness in R axilla, shoulder, pec; improved following treatment.  32 y.o. female presents with Stage I lymphedema secondary to breast cancer treatment.  In addition to lymphedema , the patient is experiencing these side effects from cancer treatments: Myofascial/scar restrictions post op  Radiation: Fibrosis/myofascial restrictions  Currently the right limb is 17 % bigger than the left limb.   Patient requires skilled Physical Therapy services  for the following problem list and secondary functional limitations:    Problem List:   Lack of knowledge of lymphedema/edema self care  Uncontrolled swelling and/or lack of appropriate compression garment    Secondary Functional Limitations:  Decreased knowledge of self care  of lymphedema/edema puts patient at risk for increased swelling and infection  Uncontrolled swelling can increase chances of  infection, and limit functional ROM             Impairments: postural weakness, increased edema, impaired sensation, muscular restrictions, uncontrolled swelling, impaired ADLs, decreased skin integrity and decreased knowledge of self-care                Prognosis: excellent prognosis    Positive Prognosis Rationale: motivated for treatment, caregiver/family support, chronicity of condition, severity of symptoms, response to trial tx and previous tx with benefit.    Barriers to therapy: none identified    Therapy Goals      Goals:      Short Term Goals to be addressed in 4 weeks:  1. Pt will be independent with home stretching, ROM HEP to allow progression towards long-term goals, potentially increased comfort, ROM.   2. Pt will participate in MLD, be educated in self MLD to promote improved fluid transport, potentially decreased R breast and/or UE edema.  3. Pt will be able to articulate skin care recommendations, as well as signs/symptoms of infection, to promote long-term skin health, infection prevention, long-term maintenance of condition.    Long-Term Goals to be addressed in 8 weeks:  1. Pt will receive assistance with compression garment fitting and procurement, allowing potentially increased comfort, long-term maintenance of condition.   2. Patient to demonstrate reduction in R UE limb volume to no more than 12% larger than L UE.      Plan    Therapy options: will be seen for skilled physical therapy services    Planned therapy interventions: Body Mechanics Training, Compression Bandaging, Compression Pump, Diaphragmatic/Pursed-lip Breathing, Dry Needling, E-Stim, Education Therapist, art, Education - Patient, Endurance Activites, Functional Mobility, Garment Measurement, Home Exercise Program, Manual Lymph Drainage, Manual Therapy, Neuromuscular Re-education, Postural Training, Self-Care/Home Training, Taping, TENS, Therapeutic Activities and Therapeutic Exercises      Frequency: 1x week    Duration in weeks: 8 weeks    Education provided to:  patient.    Education provided: Equipment recommendations, HEP, Garment options, Manual lymph drainage and Treatment options and plan    Education results: needs reinforcement and needs further instruction.      Next visit plan:        MLD    Total Session Time: 55    Treatment rendered today:      ______________________________________________________________________    Manual Therapy x 45'  MLD, including   Short neck  L axillary  R inguinal  R to L anterior A-A anastamosis  R A-I anastamosis  Posterior A-A anastamosis  R UE  R breast   -MFR to R breast, axilla    Therapeutic Exercise x 10'  -pulleys, flex and abd    -provided patient with card for P&O, patient plans to call and have one sleeve/glove set at 20-30 and one set at 30-40    ______________________________________________________________________          Subjective:   History of Present Condition  Date of Surgery: 05/07/2020 ([facility])    History of Present Condition/Chief Complaint:       R UE lymphedema    Subjective:     Patient states that she has been cleared to return to using her pump, garment, and massage after surgery. She had a CT done last week and was surprised at how tight and restricted she felt when trying to put her arm over her head; it felt like there was a cord pulling across her axilla and down her chest that was restricting her.    Therapist wore a mask for the entire session.     I reviewed the no-show/attendance policy with the patient and caregiver(s). The patient and/or family is aware that they must call to cancel appointments more than 24 hours in advance. They are also aware that if they late cancel (less than 24 hours from appointment), arrives greater than 15 minutes late, or no-show three times, we reserve the right to cancel their remaining appointments. This policy is in place to allow [place] to best serve the needs of our caseload.      Quality of life: good    Pain  Current pain rating: 0  At best pain rating: 0  At worst pain rating: 4  Location: R arm above elbow      Precautions and Equipment  Precautions: Cancer history  Prior Functional Status:     Functional Limitation(s)-No physical limitations  Current functional status: limited recreation and limited exercise        Treatments  Previous treatment: physical therapy      Patient Goals  Patient goals for therapy: decreased edema  Patient goal: manage symptoms before surgery      Objective:   Lymphedema                       Inital values taken: 02/28/2022  9:01 AM   Involved arm: Right   Dominant arm: Right   Patient position: Seated   Total volume: right 4355.41 mL 02/28/2022  9:01 AM   Total volume: left 3720.41 mL 02/28/2022  9:01 AM   L/R difference +635.00 mL +17.07 %          Inital values taken: 02/28/2022  9:01 AM   Involved arm: Right   Dominant arm: Right   Patient position: Seated   Total volume: right 4355.41 mL 02/28/2022  9:01 AM   Total volume: left 3720.41 mL 02/28/2022  9:01 AM   L/R  difference +635.00 mL +17.07 %                                         I attest that I have reviewed the above information.  Signed: Edwena Bunde, PT  06/03/2022 7:39 AM

## 2022-06-05 DIAGNOSIS — C50911 Malignant neoplasm of unspecified site of right female breast: Principal | ICD-10-CM

## 2022-06-05 DIAGNOSIS — Z170 Estrogen receptor positive status [ER+]: Principal | ICD-10-CM

## 2022-06-06 ENCOUNTER — Ambulatory Visit
Admit: 2022-06-06 | Discharge: 2022-06-07 | Payer: PRIVATE HEALTH INSURANCE | Attending: Internal Medicine | Primary: Internal Medicine

## 2022-06-06 NOTE — Unmapped (Signed)
Patient ID: Tami Gibson is a 32 y.o. female who presents for follow up of ER    Informant: Patient came to appointment alone.    Assessment/Plan:      1. Diverticulitis  Reviewed recent ER visit for diverticulitis, including labs, imaging, notes. Presentation with bloody diarrhea was atypical for diverticulitis, even though her CT seemed consistent with rectosigmoid diverticulitis. Ddx includes infectious or ischemic colitis, IBD. She is improving with antibiotics and bland diet. Will advance diet as tolerated. Given atypical presentation, referred for follow up colonoscopy.   - Colonoscopy; Future        Preventive services addressed today  We did not review preventive services today    -- Patient verbalized an understanding of today's assessment and recommendations, as well as the purpose of ongoing medications.    No follow-ups on file.    Medication adherence and barriers to the treatment plan have been addressed. Opportunities to optimize healthy behaviors have been discussed. Patient / caregiver voiced understanding.        Subjective:     HPI  Around Christmas started having episodes of abdominal cramps, low back pain and bloody diarrhea with fecal urgency. Went to ER on 12/29 and CT revealed likely diverticulitis. She was treated with IV fluids and discharged with 10 day course of PO Augmentin. Now feeling much better. Pain has resolved. Stools have been a little loose but normalizing. No more blood in stool. No fevers or chills. Eating crackers, rice, toast, eggs and doing fine.     ROS  As per HPI.    Outpatient Medications Prior to Visit   Medication Sig Dispense Refill    abemaciclib (VERZENIO) 100 mg Tab tablet Take 1 tablet (100 mg total) by mouth Two (2) times a day. Take with or without food. 56 tablet 5    acetaminophen (TYLENOL) 325 MG tablet Take 2 tablets (650 mg total) by mouth every six (6) hours as needed for pain.      amoxicillin-clavulanate (AUGMENTIN) 875-125 mg per tablet Take 1 tablet by mouth two (2) times a day for 10 days. 20 tablet 0    calcium carbonate (TUMS) 200 mg calcium (500 mg) chewable tablet Chew 1 tablet (200 mg of elem calcium total) daily.      cetirizine (ZYRTEC) 10 MG tablet Take 1 tablet (10 mg total) by mouth daily. 30 tablet 2    cetirizine (ZYRTEC) 5 MG chewable tablet Chew 1 tablet (5 mg total) daily.      cholecalciferol, vitamin D3-25 mcg, 1,000 unit,, 25 mcg (1,000 unit) capsule Take 1 capsule (25 mcg total) by mouth.      cyanocobalamin 100 MCG tablet Take 1 tablet (100 mcg total) by mouth daily.      cyanocobalamin, vitamin B-12, 1000 MCG tablet Take 1 tablet (1,000 mcg total) by mouth daily.      docusate sodium (COLACE) 100 MG capsule Take 1 capsule (100 mg total) by mouth nightly as needed (PRN).      escitalopram oxalate (LEXAPRO) 20 MG tablet Take 1 tablet (20 mg total) by mouth daily. 90 tablet 3    letrozole (FEMARA) 2.5 mg tablet Take 1 tablet (2.5 mg total) by mouth daily. 90 tablet 3    leuprolide (LUPRON) 3.75 mg injection Inject 3.75 mg into the muscle.      omeprazole (PRILOSEC) 20 MG capsule Take 1 capsule (20 mg total) by mouth daily. 90 capsule 0    omeprazole (PRILOSEC) 20 MG capsule Take 1 capsule by  mouth once daily 90 capsule 0    ondansetron (ZOFRAN-ODT) 4 MG disintegrating tablet DISSOLVE 1 TABLET IN MOUTH EVERY 8 HOURS AS NEEDED FOR NAUSEA 18 tablet 0    rimegepant (NURTEC ODT) 75 mg TbDL Take 1 tablet (75 mg total) by mouth.      rimegepant (NURTEC ODT) 75 mg TbDL Take 1 tablet (75 mg total) by mouth daily as needed.      SUMAtriptan (IMITREX) 100 MG tablet TAKE ONE TABLET BY MOUTH AT ONSET OF MIGRAINE FOR UP TO 1 DOSE . IF SYMPTOMS PERSIST A SECOND DOSE MAY BE TAKEN IN 2 HOURS IF NEEDED      SUMAtriptan (IMITREX) 25 MG tablet Take 1 tablet (25 mg total) by mouth.      topiramate (TOPAMAX) 25 MG tablet TAKE 3 TABLETS BY MOUTH ONCE DAILY      traZODone (DESYREL) 50 MG tablet TAKE 1/2 TO 1 (ONE-HALF TO ONE) TABLET BY MOUTH NIGHTLY AS NEEDED FOR SLEEP 30 tablet 0    vitamin E-134 mg, 200 UNIT, 134 mg (200 UNIT) capsule Take 1 capsule (134 mg total) by mouth daily.      WEGOVY 1.7 MG/0.75 ML SUBCUTANEOUS PEN INJECTOR INJECT 0.75ML (1.7MG  TOTAL) ML SUBCUTANEOUSLY  ONCE A WEEK 4 mL 0     Facility-Administered Medications Prior to Visit   Medication Dose Route Frequency Provider Last Rate Last Admin    leuprolide (LUPRON) 3.75 mg injection                The following portions of the patient's history were reviewed and updated as appropriate: allergies, current medications, past medical history, past social history, and problem list.          Objective:       Vital Signs  BP 110/76 (BP Site: L Arm, BP Position: Sitting, BP Cuff Size: Large)  - Pulse 95  - Ht 167.6 cm (5' 6)  - Wt 89.4 kg (197 lb)  - LMP 12/15/2019 Comment: luoprone injection - SpO2 98%  - BMI 31.80 kg/m??      Exam  Physical Exam  Constitutional:       Appearance: Normal appearance.   Cardiovascular:      Rate and Rhythm: Normal rate and regular rhythm.      Heart sounds: Normal heart sounds.   Pulmonary:      Effort: Pulmonary effort is normal.      Breath sounds: Normal breath sounds.   Abdominal:      General: Abdomen is flat. There is no distension.      Palpations: Abdomen is soft.      Tenderness: There is no abdominal tenderness.   Neurological:      Mental Status: She is alert.

## 2022-06-06 NOTE — Unmapped (Signed)
We have entered a referral for you today.  Please call the [facility] GI Procedure office at [phone] to schedule a time for this appointment.

## 2022-06-09 NOTE — Unmapped (Signed)
Colonoscopy  Procedure #1     Procedure #2   [id number]  MRN     Endoscopist     Is the patient's health insurance [address], [place] [facility]), or [employer]?     Urgent procedure     Are you pregnant?     Are you in the process of scheduling or awaiting results of a heart ultrasound, stress test, or catheterization to evaluate new or worsening chest pain, dizziness, or shortness of breath?     Do you take: Plavix (clopidogrel), Coumadin (warfarin), Lovenox (enoxaparin), Pradaxa (dabigatran), Effient (prasugrel), Xarelto (rivaroxaban), Eliquis (apixaban), Pletal (cilostazol), or Brilinta (ticagrelor)?          Which of the above medications are you taking?          What is the name of the medical practice that manages this medication?          What is the name of the medical provider who manages this medication?     Do you have hemophilia, von Willebrand disease, or low platelets?     Do you have a pacemaker or implanted cardiac defibrillator?     Has a [place] [facility] provider specified the location(s)?     Which location(s) did the [facility] provider specify?        [facility]        [place]        [facility]        [facility]     Is procedure indication for variceal banding (this does NOT include variceal screening)?     Have you had a heart attack, stroke or heart stent placement within the past 6 months?     Month of event     Year of event (ONLY ENTER LAST 2 DIGITS)        5  Height (feet)   6  Height (inches)   197  Weight (pounds)   31.8  BMI          Did the ordering provider specify a bowel prep?          What bowel prep was specified?     Do you have chronic kidney disease?     Do you have chronic constipation or have you had poor quality bowel preps for past colonoscopies?     Do you have Crohn's disease or ulcerative colitis?     Have you had weight loss surgery?          When you walk around your house or grocery store, do you have to stop and rest due to shortness of breath, chest pain, or light-headedness?     Do you ever use supplemental oxygen?     Have you been hospitalized for cirrhosis of the liver or heart failure in the last 12 months?     Have you been treated for mouth or throat cancer with radiation or surgery?     Have you been told that it is difficult for doctors to insert a breathing tube in you during anesthesia?     Have you had a heart or lung transplant?          Are you on dialysis?     Do you have cirrhosis of the liver?     Do you have myasthenia gravis?     Is the patient a prisoner?          Have you been diagnosed with sleep apnea or do you wear a  CPAP machine at night?     Are you younger than 30?     Have you previously received propofol sedation administered by an anesthesiologist for a GI procedure?     Do you drink an average of more than 3 drinks of alcohol per day?     Do you regularly take suboxone or any prescription medications for chronic pain?     Do you regularly take Ativan, Klonopin, Xanax, Valium, lorazepam, clonazepam, alprazolam, or diazepam?     Have you previously had difficulty with sedation during a GI procedure?     Have you been diagnosed with PTSD?     Are you allergic to fentanyl or midazolam (Versed)?     Do you take medications for HIV?   ################# ## ###################################################################################################################   MRN:          [id number]   Anticoag Review:  No   Nurse Triage:  No   GI Clinic Consult:  No   Procedure(s):  Colonoscopy     0   Location[facility]s):  [facility]     [facility]     [place]     [facility]Mod Sed   Endoscopist:  0   Urgent:            No   Prep:               [place]                  ################# ## ###################################################################################################################

## 2022-06-17 NOTE — Unmapped (Signed)
[facility] THERAP[facility] SERVICES [place]  OUTPATIENT PHYSICAL THERAPY  06/17/2022  Note Type: Treatment Note       Patient Name: Tami Gibson  Date of Birth:1990-07-31  Diagnosis:   Encounter Diagnoses   Name Primary?    Lymphedema syndrome, postmastectomy Yes    Malignant neoplasm of right breast in female, estrogen receptor positive, unspecified site of breast (CMS-HCC)      Referring MD:  [name], MD    Date of Onset of Impairment-No date available  Date PT Care Plan Established or Reviewed-No date available  Date PT Treatment Started-No date available   Plan of Care Effective Date:     Hematology/Oncology History Overview Note   32 year old female with recently self palpated right breast lump. A targeted right breast ultrasound on 11/15/2019 showed an irregular mass with spiculated margins at the 7:00 position, 6 cm from the nipple, measuring 1.4 x 1.2 x 1.0 cm. There were 2 abnormal level 1 axillary lymph nodes measuring 1.4 x 1.3 cm and 1.2 x 0.9 cm. Additionally, there was a high level 1/level 2 axillary lymph node, measuring 2.1 x 1.3 cm with an effaced fatty hilum.      Malignant neoplasm of right breast in female, estrogen receptor positive (CMS-HCC)   11/09/2019 Initial Diagnosis    Malignant neoplasm of right breast in female, estrogen receptor positive (CMS-HCC)     11/15/2019 Biopsy    Invasive ductal carcinoma  - Nottingham combined histologic grade: 1  ER 91-100%, PR 91-100%, HER2 negative by IHC    Lymph node, right axilla, core biopsy  - Lymph node positive for metastatic carcinoma,      12/02/2019 -  Cancer Staged    Staging form: Breast, AJCC 8th Edition  - Clinical: Stage IB (cT1c, cN1, cM0, G1, ER+, PR+, HER2-) - Signed by [name], MD on 12/02/2019       12/09/2019 - 03/22/2020 Chemotherapy    OP BREAST AC (Dose Dense) q2W X 4 CYCLES, THEN PACLITAXEL (DOSE DENSE) Q2W x 4 CYCLES  DOXOrubicin 60 mg/m2 IV on day 1, cyclophosphamide 600 mg/m2 IV on day 1, every 2 weeks for 4 cycles, then PACLitaxel 175 mg/m2 every 2 weeks for 4 cycles      04/20/2020 Surgery    Breast, right, simple mastectomy  -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 Extracapsular extension is identified      Breast, left, simple mastectomy  -Benign breast tissue with no atypia, in situ or invasive carcinoma identified     04/20/2020 -  Cancer Staged    Staging form: Breast, AJCC 8th Edition  - Pathologic stage from 04/20/2020: No Stage Recommended (ypT1c, pN1a, cM0, G2, ER+, PR+, HER2-) - Signed by [name], MD on 04/25/2020       05/07/2020 Surgery    Lymph nodes, right axillary, dissection  -One of sixteen lymph nodes involved by metastatic adenocarcinoma (1/16)  -Metastatic focus measures 3 mm in greatest dimension  -No extracapsular extension is identified     06/28/2020 - 08/06/2020 Radiation    The total radiation dose will be 5000 cGy at 200 cGy/fraction for a total of 25 fractions, treated once a day     08/13/2020 Endocrine/Hormone Therapy    Letrozole 2.5mg  +Ovarian suppression (monthly lupron)     Malignant neoplasm of right breast in female, estrogen receptor  positive (CMS-HCC)   05/07/2020 Surgery    Lymph nodes, right axillary, dissection  -One of sixteen lymph nodes involved by metastatic adenocarcinoma (1/16)  -Metastatic focus measures 3 mm in greatest dimension  -No extracapsular extension is identified     06/13/2020 -  Radiation    Radiation Therapy Treatment Details (Noted on 06/13/2020)  Site: Right Breast - Overlapping sites  Technique: 3D CRT  Goal: No goal specified  Planned Treatment Start Date: No planned start date specified     06/21/2020 -  Radiation    Radiation Therapy Treatment Details (Noted on 06/21/2020)  Site: Right Breast - Overlapping sites  Technique: 3D CRT  Goal: No goal specified  Planned Treatment Start Date: No planned start date specified           Assessment/Plan:    Assessment  Assessment details:    Myofascial restriction and muscle tightness in R axilla, shoulder, pec; improved following treatment.  32 y.o. female presents with Stage I lymphedema secondary to breast cancer treatment.  In addition to lymphedema , the patient is experiencing these side effects from cancer treatments: Myofascial/scar restrictions post op  Radiation: Fibrosis/myofascial restrictions  Currently the right limb is 17 % bigger than the left limb.   Patient requires skilled Physical Therapy services  for the following problem list and secondary functional limitations:    Problem List:   Lack of knowledge of lymphedema/edema self care  Uncontrolled swelling and/or lack of appropriate compression garment    Secondary Functional Limitations:  Decreased knowledge of self care  of lymphedema/edema puts patient at risk for increased swelling and infection  Uncontrolled swelling can increase chances of  infection, and limit functional ROM             Impairments: postural weakness, increased edema, impaired sensation, muscular restrictions, uncontrolled swelling, impaired ADLs, decreased skin integrity and decreased knowledge of self-care                Prognosis: excellent prognosis    Positive Prognosis Rationale: motivated for treatment, caregiver/family support, chronicity of condition, severity of symptoms, response to trial tx and previous tx with benefit.    Barriers to therapy: none identified    Therapy Goals      Goals:      Short Term Goals to be addressed in 4 weeks:  1. Pt will be independent with home stretching, ROM HEP to allow progression towards long-term goals, potentially increased comfort, ROM.   2. Pt will participate in MLD, be educated in self MLD to promote improved fluid transport, potentially decreased R breast and/or UE edema.  3. Pt will be able to articulate skin care recommendations, as well as signs/symptoms of infection, to promote long-term skin health, infection prevention, long-term maintenance of condition.    Long-Term Goals to be addressed in 8 weeks:  1. Pt will receive assistance with compression garment fitting and procurement, allowing potentially increased comfort, long-term maintenance of condition.   2. Patient to demonstrate reduction in R UE limb volume to no more than 12% larger than L UE.      Plan    Therapy options: will be seen for skilled physical therapy services    Planned therapy interventions: Body Mechanics Training, Compression Bandaging, Compression Pump, Diaphragmatic/Pursed-lip Breathing, Dry Needling, E-Stim, Education Therapist, art, Education - Patient, Endurance Activites, Functional Mobility, Garment Measurement, Home Exercise Program, Manual Lymph Drainage, Manual Therapy, Neuromuscular Re-education, Postural Training, Self-Care/Home Training, Taping, TENS, Therapeutic Activities and Therapeutic Exercises  Frequency: 1x week    Duration in weeks: 8 weeks    Education provided to: patient.    Education provided: Equipment recommendations, HEP, Garment options, Manual lymph drainage and Treatment options and plan    Education results: needs reinforcement and needs further instruction.      Next visit plan:        MLD    Total Session Time: 55    Treatment rendered today:      ______________________________________________________________________    Manual Therapy x 55'  -soft tissue mobilization to R axilla including stretching, fascial mobilization, trigger point release, scar tissue mobilization      ______________________________________________________________________            Subjective:   History of Present Condition  Date of Surgery: 05/07/2020 ([facility])    History of Present Condition/Chief Complaint:       R UE lymphedema    Subjective:     Doing well, was able to go bowling and bowl a full game with proper form! Previously had to use the kids ramp or roll it between her legs. Was sore the next day but didn't feel limited in her activity. Has been massaging the scar tissue with a roller and a Administrator, sports.    Therapist wore a mask for the entire session.     I reviewed the no-show/attendance policy with the patient and caregiver(s). The patient and/or family is aware that they must call to cancel appointments more than 24 hours in advance. They are also aware that if they late cancel (less than 24 hours from appointment), arrives greater than 15 minutes late, or no-show three times, we reserve the right to cancel their remaining appointments. This policy is in place to allow [place] to best serve the needs of our caseload.      Quality of life: good    Pain  Current pain rating: 0  At best pain rating: 0  At worst pain rating: 4  Location: R arm above elbow      Precautions and Equipment  Precautions: Cancer history  Prior Functional Status:     Functional Limitation(s)-No physical limitations  Current functional status: limited recreation and limited exercise        Treatments  Previous treatment: physical therapy      Patient Goals  Patient goals for therapy: decreased edema  Patient goal: manage symptoms before surgery      Objective:   Lymphedema                       Inital values taken: 02/28/2022  9:01 AM   Involved arm: Right   Dominant arm: Right   Patient position: Seated   Total volume: right 4355.41 mL 02/28/2022  9:01 AM   Total volume: left 3720.41 mL 02/28/2022  9:01 AM   L/R difference +635.00 mL +17.07 %          Inital values taken: 02/28/2022  9:01 AM   Involved arm: Right   Dominant arm: Right   Patient position: Seated   Total volume: right 4355.41 mL 02/28/2022  9:01 AM   Total volume: left 3720.41 mL 02/28/2022  9:01 AM   L/R difference +635.00 mL +17.07 %                                         I attest that I have  reviewed the above information.  Signed: Edwena Bunde, PT  06/17/2022 7:49 AM

## 2022-06-20 ENCOUNTER — Institutional Professional Consult (permissible substitution): Admit: 2022-06-20 | Discharge: 2022-06-21 | Payer: PRIVATE HEALTH INSURANCE

## 2022-06-20 DIAGNOSIS — C50911 Malignant neoplasm of unspecified site of right female breast: Principal | ICD-10-CM

## 2022-06-20 DIAGNOSIS — Z170 Estrogen receptor positive status [ER+]: Principal | ICD-10-CM

## 2022-06-20 MED ORDER — PEG 3350-ELECTROLYTES 236 GRAM-22.74 GRAM-6.74 GRAM-5.86 GRAM SOLUTION
Freq: Once | ORAL | 0 refills | 1 days | Status: CP
Start: 2022-06-20 — End: 2022-06-20

## 2022-06-20 MED ADMIN — leuprolide (LUPRON) injection 3.75 mg: 3.75 mg | INTRAMUSCULAR | @ 18:00:00 | Stop: 2022-06-20

## 2022-06-20 NOTE — Unmapped (Signed)
Pt tolerated Lupron 3.75 injection to right dorsogluteal upper, outer quadrant without difficulty. Band aid and gauze placed over injection sites. [facility] ambulatory, steady gait, NAD, no questions, complaints, nor concerns voiced at d/c.

## 2022-06-21 LAB — URINALYSIS WITH MICROSCOPY WITH CULTURE REFLEX
BACTERIA: NONE SEEN /HPF
BILIRUBIN UA: NEGATIVE
BLOOD UA: NEGATIVE
GLUCOSE UA: NEGATIVE
KETONES UA: NEGATIVE
LEUKOCYTE ESTERASE UA: NEGATIVE
NITRITE UA: NEGATIVE
PH UA: 5.5 (ref 5.0–9.0)
RBC UA: <1 /HPF (ref <=4)
SPECIFIC GRAVITY UA: 1.033 — ABNORMAL HIGH (ref 1.003–1.030)
SQUAMOUS EPITHELIAL: <1 /HPF (ref 0–5)
UROBILINOGEN UA: <2
WBC UA: <1 /HPF (ref 0–5)

## 2022-06-21 LAB — COMPREHENSIVE METABOLIC PANEL
ALBUMIN: 4 g/dL (ref 3.4–5.0)
ALKALINE PHOSPHATASE: 89 U/L (ref 46–116)
ALT (SGPT): 23 U/L (ref 10–49)
ANION GAP: 6 mmol/L (ref 5–14)
AST (SGOT): 25 U/L (ref <=34)
BILIRUBIN TOTAL: 0.5 mg/dL (ref 0.3–1.2)
BLOOD UREA NITROGEN: 12 mg/dL (ref 9–23)
BUN / CREAT RATIO: 10
CALCIUM: 9.7 mg/dL (ref 8.7–10.4)
CHLORIDE: 108 mmol/L — ABNORMAL HIGH (ref 98–107)
CO2: 27 mmol/L (ref 20.0–31.0)
CREATININE: 1.22 mg/dL — ABNORMAL HIGH
EGFR CKD-EPI (2021) FEMALE: 60 mL/min/{1.73_m2} (ref >=60)
GLUCOSE RANDOM: 106 mg/dL (ref 70–179)
POTASSIUM: 3.8 mmol/L (ref 3.4–4.8)
PROTEIN TOTAL: 7.4 g/dL (ref 5.7–8.2)
SODIUM: 141 mmol/L (ref 135–145)

## 2022-06-21 LAB — CBC W/ AUTO DIFF
BASOPHILS ABSOLUTE COUNT: 0 10*9/L (ref 0.0–0.1)
BASOPHILS RELATIVE PERCENT: 0.7 %
EOSINOPHILS ABSOLUTE COUNT: 0 10*9/L (ref 0.0–0.5)
EOSINOPHILS RELATIVE PERCENT: 1 %
HEMATOCRIT: 36 % (ref 34.0–44.0)
HEMOGLOBIN: 12.2 g/dL (ref 11.3–14.9)
LYMPHOCYTES ABSOLUTE COUNT: 1.6 10*9/L (ref 1.1–3.6)
LYMPHOCYTES RELATIVE PERCENT: 38.2 %
MEAN CORPUSCULAR HEMOGLOBIN CONC: 33.8 g/dL (ref 32.0–36.0)
MEAN CORPUSCULAR HEMOGLOBIN: 28.5 pg (ref 25.9–32.4)
MEAN CORPUSCULAR VOLUME: 84.4 fL (ref 77.6–95.7)
MEAN PLATELET VOLUME: 7.3 fL (ref 6.8–10.7)
MONOCYTES ABSOLUTE COUNT: 0.3 10*9/L (ref 0.3–0.8)
MONOCYTES RELATIVE PERCENT: 6.2 %
NEUTROPHILS ABSOLUTE COUNT: 2.3 10*9/L (ref 1.8–7.8)
NEUTROPHILS RELATIVE PERCENT: 53.9 %
NUCLEATED RED BLOOD CELLS: 0 /100{WBCs} (ref <=4)
PLATELET COUNT: 172 10*9/L (ref 150–450)
RED BLOOD CELL COUNT: 4.27 10*12/L (ref 3.95–5.13)
RED CELL DISTRIBUTION WIDTH: 14.3 % (ref 12.2–15.2)
WBC ADJUSTED: 4.3 10*9/L (ref 3.6–11.2)

## 2022-06-21 LAB — LIPASE: LIPASE: 45 U/L (ref 12–53)

## 2022-06-21 LAB — PREGNANCY, URINE: PREGNANCY TEST URINE: NEGATIVE

## 2022-06-22 ENCOUNTER — Ambulatory Visit
Admit: 2022-06-22 | Discharge: 2022-06-22 | Disposition: A | Discharge status: Home / Self Care | Payer: PRIVATE HEALTH INSURANCE

## 2022-06-22 ENCOUNTER — Emergency Department
Admit: 2022-06-22 | Discharge: 2022-06-22 | Disposition: A | Discharge status: Home / Self Care | Payer: PRIVATE HEALTH INSURANCE

## 2022-06-22 MED ADMIN — iohexol (OMNIPAQUE) 350 mg iodine/mL solution 100 mL: 100 mL | INTRAVENOUS | @ 05:00:00 | Stop: 2022-06-21

## 2022-06-22 NOTE — Unmapped (Signed)
12/29 - Dx'd diverticulitis here    Yesterday had two hard-to-pass BMs, noticed bleeding when wiping, took Colace. Last night abdominal pain began as well, feels like prior episode. Pain was enough to double over during dinner. Encouraged to come in per MD

## 2022-06-22 NOTE — Unmapped (Signed)
[facility] Emergency Department Provider Note      ED Clinical Impression     Final diagnoses:   Lower abdominal pain (Primary)   Constipation, unspecified constipation type       HPI, ED Course, Assessment and Plan     Initial Clinical Impression:    June 21, 2022 11:23 PM   Tami Gibson is a 32 y.o. female with past medical history of diverticulitis (recently diagnosed on 05/30/22), IIH, cerebral venous sinus thrombosis, HTN, pyelonephritis, GERD, anemia and breast cancer (currently on abemaciclib and letrozole) presenting with abdominal pain. Patient reports endorsing 2 days of worsening abdominal pain that started as a dull achy pain but resulted in her doubling over in pain earlier today. She has been constipated and passed 2 hard BMs with some red blood when wiping yesterday and 1 hard BM today. She took Colace yesterday and today with little relief. She has had history of hemorrhoids during pregnancy and has been taking Colace regularly during her chemotherapy treatment. Denies nausea, emesis or fever.    Per chart review, patient was seen on 05/30/2022 at [facility] ED for evaluation of bright red blood per rectum and abdominal pain. CT Abdomen Pelvis revealed circumferential wall thickening involving a long segment of the rectosigmoid colon with surrounding free fluid, favored to represent acute, uncomplicated diverticulitis. No fluid collections. No evidence of active GI bleed. She was prescribed Augmentin.     BP 125/87  - Pulse 102  - Temp 36.8 ??C (98.3 ??F) (Oral)  - Resp 18  - Ht 165.1 cm (5' 5)  - Wt 85.5 kg (188 lb 7.9 oz)  - LMP 12/15/2019 Comment: luoprone injection - SpO2 97%  - BMI 31.37 kg/m??     MDM  Vital signs remarkable for borderline tachycardia to 102 but otherwise generally reassuring.  Patient is overall well-appearing in no acute distress.  Abdomen is soft, nondistended, nontender to palpation.  Rebound or guarding.  No CVA tenderness.  Overall abdominal exam is reassuring without evidence of acute surgical pathology.  Differential included repeat diverticulitis, SBO, bowel perforation versus abscess, constipation, UTI, pancreatitis, appendicitis.      Patient had blood work in triage that showed CBC without leukocytosis or anemia.  Hemodynamically patient is stable.  Normal platelets.  CMP with baseline creatinine 1.22.  No significant electrolyte abnormalities. Low concern for biliary etiology given no right upper quadrant tenderness and reassuring LFTs.  Lipase normal low suspicion for pancreatitis.  UA without hematuria to suggest nephrolithiasis or evidence of infection to suggest UTI.  Pregnancy is negative.  Abdominal CT showed no evidence of recurrent diverticulitis or obstruction.  She does have small fat-containing umbilical hernia but no evidence of strangulation or incarceration.  Patient does have large stool burden noted throughout the colon.  Suspect constipation contributing to abdominal cramping pain.  Discussed increasing bowel regimen at home including MiraLAX and senna.  Encouraged significant hydration obtain his medications.  Patient to follow-up with primary care doctor and oncologist next week for reevaluation.  Return precautions discussed.  Patient pressors agreeable with this discharge plan.    Social Determinants of Health with Concerns     Internet Connectivity: Not on file   Substance Use: Not on file   Physical Activity: Not on file   Interpersonal Safety: Not on file   Stress: Not on file   Intimate Partner Violence: Not on file   Social Connections: Not on file     _____________________________________________________________________    The case was  discussed with the attending physician who is in agreement with the above assessment and plan    Additional Medical Decision Making     I have reviewed the vital signs and the nursing notes. Labs and radiology results that were available during my care of the patient were independently reviewed by me and considered in my medical decision making.   I independently visualized the EKG tracing if performed  I independently visualized the radiology images if performed  I reviewed the patient's prior medical records if available.  Additional history obtained from family if available.  For specific reads/information impacting care please refer to MDM/ED Course continued documentation    Past History     PAST MEDICAL HISTORY/PAST SURGICAL HISTORY:   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    [facility]    Gestational hypertension 01/30/2017    end of pregnancy had hypertension, was induced    Headache     migraine occasionally    Hypertension     during week 37 week, pt induced and had C/S due to hypertension    IIH (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/2015    followed by ophthalmologist    Kidney stone     Malignant neoplasm of right breast in female, 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/30/2019    Pyelonephritis 09/2016    Kidney stones    Urinary tract infection     Had 5-6 in Lifetime, esp high school and college age    Varicella     during childhood    Vitamin B 12 deficiency        Past Surgical History:   Procedure Laterality Date    BREAST BIOPSY Right     IDC    CESAREAN SECTION  01/30/2017    breech    CHEMOTHERAPY      IR INSERT PORT AGE GREATER THAN 5 YRS  12/08/2019    IR INSERT PORT AGE GREATER THAN 5 YRS 12/08/2019 Jobe Gibbon, MD IMG VIR H&V [facility]    LUMBAR PUNCTURE DIAGNOSTIC [facility] HISTORICAL RESULT)  01/2015    to relieve ICP    PR BX/REMV,LYMPH NODE,DEEP AXILL Right 04/20/2020    Procedure: BX/EXC LYMPH NODE; OPEN, DEEP AXILRY NODE;  Surgeon: Moss Mc, MD;  Location: MAIN OR [facility];  Service: Surgical Oncology Breast    PR CESAREAN DELIVERY ONLY N/A 01/30/2017    Procedure: CESAREAN DELIVERY ONLY;  Surgeon: Asher Muir, MD;  Location: L&D C-SECTION OR SUITES [facility];  Service: Maternal-Fetal Medicine    PR CESAREAN DELIVERY ONLY N/A 02/17/2019    Procedure: CESAREAN DELIVERY ONLY;  Surgeon: Lonny Prude, MD;  Location: L&D C-SECTION OR SUITES [facility];  Service: Maternal-Fetal Medicine    PR GRAFTING OF AUTOLOGOUS FAT BY LIPO 50 CC OR LESS Bilateral 04/16/2022    Procedure: GRAFTING OF AUTOLOGOUS FAT HARVESTED BY LIPOSUCTION TECHNIQUE TO TRUNK, BREASTS, SCALP, ARMS, AND/OR LEGS; 50 CC OR LESS INJECTATE;  Surgeon: Arsenio Katz, MD;  Location: MAIN OR [place];  Service: Plastics    PR IMPLNT BIO IMPLNT FOR SOFT TISSUE REINFORCEMENT Bilateral 04/20/2020    Procedure: IMPLANTATION BIOLOGIC IMPLANT(EG, ACELLULAR DERMAL MATRIX) FOR SOFT TISSUE REINFORCEMENT(EG, BREAST, TRUNK);  Surgeon: Arsenio Katz, MD;  Location: MAIN OR [facility];  Service: Plastics  PR INSJ/RPLCMT BREAST IMPLANT SEP DAY MASTECTOMY Right 04/16/2022    Procedure: INSERTION OR REPLACEMENT OF BREAST IMPLANT ON SEPERATE DAY FROM MASTECTOMY;  Surgeon: Arsenio Katz, MD;  Location: MAIN OR [place];  Service: Plastics    PR INTRAOPERATIVE SENTINEL LYMPH NODE ID W DYE INJECTION Right 04/20/2020    Procedure: INTRAOPERATIVE IDENTIFICATION SENTINEL LYMPH NODE(S) INCLUDE INJECTION NON-RADIOACTIVE DYE, WHEN PERFORMED;  Surgeon: Moss Mc, MD;  Location: MAIN OR [place];  Service: Surgical Oncology Breast    PR INTRAOPERATIVE SENTINEL LYMPH NODE ID W DYE INJECTION Right 05/07/2020    Procedure: INTRAOPERATIVE IDENTIFICATION SENTINEL LYMPH NODE(S) INCLUDE INJECTION NON-RADIOACTIVE DYE, WHEN PERFORMED;  Surgeon: Moss Mc, MD;  Location: MAIN OR [place];  Service: Surgical Oncology Breast    PR MASTECTOMY, SIMPLE, COMPLETE Bilateral 04/20/2020    Procedure: MASTECTOMY, SIMPLE, COMPLETE;  Surgeon: Moss Mc, MD;  Location: MAIN OR [facility];  Service: Surgical Oncology Breast    PR REMOVE ARMPITS LYMPH NODES COMPLT Right 05/07/2020    Procedure: AXILLARY LYMPHADENECTOMY; COMPLETE;  Surgeon: Moss Mc, MD;  Location: MAIN OR [facility];  Service: Surgical Oncology Breast    PR REPLACEMENT TISSUE EXPANDER W/PERMANENT IMPLANT Bilateral 08/13/2021    Procedure: REPLACEMENT OF TISSUE EXPANDER WITH PERMANENT IMPLANT;  Surgeon: Arsenio Katz, MD;  Location: MAIN OR [facility];  Service: Plastics    PR REVISION OF RECONSTRUCTED BREAST Bilateral 04/16/2022    Procedure: REVISION OF RECONSTRUCTED BREAST;  Surgeon: Arsenio Katz, MD;  Location: MAIN OR [facility];  Service: Plastics    PR TISSUE EXPANDER PLACEMENT BREAST RECONSTRUCTION Bilateral 04/20/2020    Procedure: TISSUE EXPANDER PLACEMENT IN BREAST RECONSTRUCTION, INCLUDING SUBSEQUENT EXPANSION(S);  Surgeon: Arsenio Katz, MD;  Location: MAIN OR [place];  Service: Plastics    TONSILECTOMY, ADENOIDECTOMY, BILATERAL MYRINGOTOMY AND TUBES      WISDOM TOOTH EXTRACTION  2004       MEDICATIONS:   No current facility-administered medications for this encounter.    Current Outpatient Medications:     abemaciclib (VERZENIO) 100 mg Tab tablet, Take 1 tablet (100 mg total) by mouth Two (2) times a day. Take with or without food., Disp: 56 tablet, Rfl: 5    acetaminophen (TYLENOL) 325 MG tablet, Take 2 tablets (650 mg total) by mouth every six (6) hours as needed for pain., Disp: , Rfl:     calcium carbonate (TUMS) 200 mg calcium (500 mg) chewable tablet, Chew 1 tablet (200 mg of elem calcium total) daily., Disp: , Rfl:     cetirizine (ZYRTEC) 10 MG tablet, Take 1 tablet (10 mg total) by mouth daily., Disp: 30 tablet, Rfl: 2    cetirizine (ZYRTEC) 5 MG chewable tablet, Chew 1 tablet (5 mg total) daily., Disp: , Rfl:     cholecalciferol, vitamin D3-25 mcg, 1,000 unit,, 25 mcg (1,000 unit) capsule, Take 1 capsule (25 mcg total) by mouth., Disp: , Rfl:     cyanocobalamin 100 MCG tablet, Take 1 tablet (100 mcg total) by mouth daily., Disp: , Rfl: cyanocobalamin, vitamin B-12, 1000 MCG tablet, Take 1 tablet (1,000 mcg total) by mouth daily., Disp: , Rfl:     docusate sodium (COLACE) 100 MG capsule, Take 1 capsule (100 mg total) by mouth nightly as needed (PRN)., Disp: , Rfl:     escitalopram oxalate (LEXAPRO) 20 MG tablet, Take 1 tablet (20 mg total) by mouth daily., Disp: 90 tablet, Rfl: 3    letrozole (FEMARA) 2.5 mg tablet, Take 1 tablet (2.5 mg total) by mouth daily.,  Disp: 90 tablet, Rfl: 3    leuprolide (LUPRON) 3.75 mg injection, Inject 3.75 mg into the muscle., Disp: , Rfl:     omeprazole (PRILOSEC) 20 MG capsule, Take 1 capsule by mouth once daily, Disp: 90 capsule, Rfl: 0    ondansetron (ZOFRAN-ODT) 4 MG disintegrating tablet, DISSOLVE 1 TABLET IN MOUTH EVERY 8 HOURS AS NEEDED FOR NAUSEA, Disp: 18 tablet, Rfl: 0    rimegepant (NURTEC ODT) 75 mg TbDL, Take 1 tablet (75 mg total) by mouth daily as needed., Disp: , Rfl:     SUMAtriptan (IMITREX) 100 MG tablet, TAKE ONE TABLET BY MOUTH AT ONSET OF MIGRAINE FOR UP TO 1 DOSE . IF SYMPTOMS PERSIST A SECOND DOSE MAY BE TAKEN IN 2 HOURS IF NEEDED, Disp: , Rfl:     SUMAtriptan (IMITREX) 25 MG tablet, Take 1 tablet (25 mg total) by mouth., Disp: , Rfl:     topiramate (TOPAMAX) 25 MG tablet, TAKE 3 TABLETS BY MOUTH ONCE DAILY, Disp: , Rfl:     traZODone (DESYREL) 50 MG tablet, TAKE 1/2 TO 1 (ONE-HALF TO ONE) TABLET BY MOUTH NIGHTLY AS NEEDED FOR SLEEP, Disp: 30 tablet, Rfl: 0    vitamin E-134 mg, 200 UNIT, 134 mg (200 UNIT) capsule, Take 1 capsule (134 mg total) by mouth daily., Disp: , Rfl:     WEGOVY 1.7 MG/0.75 ML SUBCUTANEOUS PEN INJECTOR, INJECT 0.75ML (1.7MG  TOTAL) ML SUBCUTANEOUSLY  ONCE A WEEK, Disp: 4 mL, Rfl: 0    Facility-Administered Medications Ordered in Other Encounters:     leuprolide (LUPRON) 3.75 mg injection, , , ,     ALLERGIES:   Patient has no known allergies.    SOCIAL HISTORY:   Social History     Tobacco Use    Smoking status: Never     Passive exposure: Never    Smokeless tobacco: Never   Substance Use Topics    Alcohol use: Not Currently       FAMILY HISTORY:  Family History   Problem Relation Age of Onset    Cancer Maternal Aunt 45        breast Ca    Hypertension Father     Other Father 50        Benign brain tumor    Hypothyroidism Mother     Cancer Paternal Aunt         Breast Ca dx in 15s    ALS Maternal Aunt     Ovarian cancer Maternal Grandmother 63    No Known Problems Maternal Grandfather     Cancer Paternal Grandmother         Unknown Cancer    No Known Problems Paternal Grandfather     No Known Problems Maternal Aunt     No Known Problems Maternal Aunt     Breast cancer Maternal Cousin         Dx in 8s    No Known Problems Paternal Aunt     No Known Problems Daughter     No Known Problems Daughter     No Known Problems Other     BRCA 1/2 Neg Hx     Colon cancer Neg Hx     Endometrial cancer Neg Hx           Review of Systems     A review of systems was performed and relevant portions were as noted above in HPI     Physical Exam     VITAL SIGNS:  BP 125/87  - Pulse 102  - Temp 36.8 ??C (98.3 ??F) (Oral)  - Resp 18  - Ht 165.1 cm (5' 5)  - Wt 85.5 kg (188 lb 7.9 oz)  - LMP 12/15/2019 Comment: luoprone injection - SpO2 97%  - BMI 31.37 kg/m??     Constitutional:   Alert and oriented.   Head:   Normocephalic and atraumatic  Eyes:   Conjunctivae are normal, EOMI, PERRL  ENT:   No notable congestion, Mucous membranes moist, External ears normal, no notable stridor  Cardiovascular:   Rate as vitals above. Appears warm and well perfused  Respiratory:   Normal respiratory effort. Breath sounds are normal.  Gastrointestinal:   Soft, non-distended, and nontender.   Musculoskeletal:    Normal range of motion in all extremities. No tenderness or edema noted in B/L lower extremities  Neurologic:   No gross focal neurologic deficits beyond baseline are appreciated.  Skin:   Skin is warm, dry and intact.       Radiology     CT Abdomen Pelvis W IV Contrast Only   Final Result   No evidence of acute intra-abdominal/pelvic pathology.             Labs     Labs Reviewed   COMPREHENSIVE METABOLIC PANEL - Abnormal; Notable for the following components:       Result Value    Chloride 108 (*)     Creatinine 1.22 (*)     All other components within normal limits   URINALYSIS WITH MICROSCOPY WITH CULTURE REFLEX - Abnormal; Notable for the following components:    Specific Gravity, UA 1.033 (*)     Protein, UA Trace (*)     Mucus, UA Moderate (*)     All other components within normal limits   LIPASE - Normal   PREGNANCY, URINE - Normal   CBC W/ DIFFERENTIAL    Narrative:     The following orders were created for panel order CBC w/ Differential.                  Procedure                               Abnormality         Status                                     ---------                               -----------         ------                                     CBC w/ Differential[[id number]]                             Final result                                                 Please view results  for these tests on the individual orders.   CBC W/ AUTO DIFF         Pertinent labs & imaging results that were available during my care of the patient were reviewed by me and considered in my medical decision making (see chart for details).    Please note- This chart has been created using [employer]. Chart creation errors have been sought, but may not always be located and such creation errors, especially pronoun confusion, do NOT reflect on the standard of medical care.    Documentation assistance was provided by Charolette Forward, Scribe on June 21, 2022 at 11:23 PM for Magda Bernheim, MD.    Documentation assistance provided by the above mentioned scribe. I was present during the time the encounter was recorded. The information recorded by the scribe was done at my direction and has been reviewed and validated by me.   Magda Bernheim, MD          Magda Bernheim, MD  Resident  06/22/22 [phone]

## 2022-06-22 NOTE — Unmapped (Signed)
Copied from CRM [id number]. Topic: HL - Nurse Triage - [facility] Contract Page  >> Jun 21, 2022  7:34 PM Sharyn Lull Shemiah Rosch wrote:  Please use the following dot phrase: .[email or web address]  [phone] [facility] reJaniyla Dondlinger [phone]     Source should be ON CALL PROVIDER   Subject should indicate PATIENT   Attach the Triage Encounter by clicking Attachments and under Patient Actions Clinical Call -click copy and copy all notes and Continue   Enter the name & time of page into the Provider Paging form each time the on call provider is paged  Route/Resolve ONLY when there is a return page or the call is completed using appropriate reason and close workspace    [phone] [facility] re: Mareen Donlon [phone]    Per Dr Gerhard Munch, Patient needs re eval prior to more meds being prescribed due to still being symptomatic and having finished meds . May need additional scan to make sure nothing else is being missed. RN called patient back. She will go to [facility] ED for eval.

## 2022-06-22 NOTE — Unmapped (Signed)
S: Patient having constant mild aching lower abdominal pain with occasional jolts of 6/10 abdominal pain all day today. Across entire bottom of abdomen. Bright red blood noted on toilet paper twice 06/20/22 after passing hard stools.  B: Dx diverticulitis 05/30/22, completed antibiotics. Breast cancer patient  A: See in 4 hrs for mild-moderate pain and constant and present >2 hours  R: Page on-call provider for second level, patient awaiting call back.      Upcoming Appt:  Future Appointments   Date Time Provider Department Center   06/24/2022  7:30 AM Edwena Bunde, PT [facility]   06/25/2022  2:00 PM ADULT ONC LAB [facility]   06/25/2022  3:00 PM Ford-Pierce, Shaunta, FNP ONCMULTI [facility]   06/27/2022  9:00 AM Harrell, Milton Ferguson, ANP HBSONC [facility]   07/01/2022  7:30 AM Edwena Bunde, PT [facility]   07/08/2022  7:30 AM Edwena Bunde, PT [facility]   07/18/2022  8:00 AM ONCHEM NURSE MULTI [facility]I [facility]   08/15/2022  9:00 AM ONCHEM NURSE MULTI ONCMULTI [facility]   09/01/2022 10:00 AM UNCW DEXA RM 1 IDEXAUW [place]   11/21/2022  9:30 AM Smitherman, Gwendel Hanson, MD CHDONCUCA [facility]   12/25/2022  8:30 AM Rogelio Seen, MD [facility]       Recent:   What is the date of your last related visit?  05/27/22 dx diverticulitis on CT scan in ED. May, 2024 colonoscopy and endoscopy scheduled.  Related acute medications Rx'd:  augmentin, completed course  Home treatment tried:  colace 06/20/22, heating pad      Relevant:   Allergies: Patient has no known allergies.  Medications: n/a   Health History: diverticulitis, breast cancer  Weight: n/a      [place] [facility] patients only:  What was the date of your last cancer treatment (mm/dd/yy)?: n/a  Was the treatment oral or infusion?: n/a  Are you currently on TVEC (yes/no)?: n/a  Reason for Disposition   [1] MILD-MODERATE pain AND [2] constant AND [3] present > 2 hours    Answer Assessment - Initial Assessment Questions  1. LOCATION: Where does it hurt?       Lower part of abdomen- equal across both sides of abdomen  2. RADIATION: Does the pain shoot anywhere else? (e.g., chest, back)      Denies  3. ONSET: When did the pain begin? (e.g., minutes, hours or days ago)       06/20/22 off and on throughout the day, worsened 06/20/22 at 10 pm. Today 06/21/22 pain has been constant.   4. SUDDEN: Gradual or sudden onset?      gradual  5. PATTERN Does the pain come and go, or is it constant?     - If it comes and goes: How long does it last? Do you have pain now?      (Note: Comes and goes means the pain is intermittent. It goes away completely between bouts.)     - If constant: Is it getting better, staying the same, or getting worse?       (Note: Constant means the pain never goes away completely; most serious pain is constant and gets worse.)       Mild aching pain never goes away, intermittent jolts of 6/10 pain  6. SEVERITY: How bad is the pain?  (e.g., Scale 1-10; mild, moderate, or severe)     - MILD (1-3): Doesn't interfere with normal activities, abdomen soft and  not tender to touch.      - MODERATE (4-7): Interferes with normal activities or awakens from sleep, abdomen tender to touch.      - SEVERE (8-10): Excruciating pain, doubled over, unable to do any normal activities.        6/10 jolts of pain, mild constant aching pain  7. RECURRENT SYMPTOM: Have you ever had this type of stomach pain before? If Yes, ask: When was the last time? and What happened that time?       Yes, dx diverticulitis 05/30/22  8. CAUSE: What do you think is causing the stomach pain?      Diverticulitis possibly  9. RELIEVING/AGGRAVATING FACTORS: What makes it better or worse? (e.g., antacids, bending or twisting motion, bowel movement)      Heating pad is helpful. Drinking warm green tea  10. OTHER SYMPTOMS: Do you have any other symptoms? (e.g., back pain, diarrhea, fever, urination pain, vomiting) Bright red quarter sized spot of blood on toilet paper twice yesterday 06/20/22 after trying to pass hard stool. Denies back pain. Hard stools today but has not seen any blood. Denies fever, urination pain, or vomiting.  11. PREGNANCY: Is there any chance you are pregnant? When was your last menstrual period?        denies pregnancy- medical menopause for breast cancer    Protocols used: Abdominal Pain - [facility]

## 2022-06-22 NOTE — Unmapped (Signed)
Regarding: Has Diverticulitis. Thinks she needs more antibiotics?  ----- Message from Gevena Cotton sent at 06/21/2022  5:25 PM EST -----  If you had not called [facility], what do you think you would have done?   Make Appointment / Call Office

## 2022-06-24 NOTE — Unmapped (Signed)
Breast Oncology Return Patient Evaluation  Referring Physician: [name], Md  [address]  [facility]  [place],  [place] [address].  PCP: Durenda Hurt, MD  Breast Med/Onc: Adonis Brook, MD    Cancer Team  Surgical Oncology: Jobe Marker, MD  Radiation Oncology: Rayetta Humphrey, MD  Medical Oncology: Adonis Brook, MD  Plastic Surgery: Levan Hurst, MD  Genetics: Audree Camel, MD    Reason for Visit: A 32 y.o. female with breast cancer referred for consultation for recommendations concerning the management of breast cancer.    Assessment/Plan:    Cancer Staging   Malignant neoplasm of right breast in female, estrogen receptor positive (CMS-HCC)  Staging form: Breast, AJCC 8th Edition  - Clinical: Stage IB (cT1c, cN1, cM0, G1, ER+, PR+, HER2-) - Signed by [name], MD on 12/02/2019  - Pathologic stage from 04/20/2020: No Stage Recommended (ypT1c, pN1a, cM0, G2, ER+, PR+, HER2-) - Signed by [name], MD on 04/25/2020     #Stage IB (cT1c, cN1, cM0, G1, ER+, PR+, HER2-)right breast cancer    Neoadjuvant chemotherapy  Completed ddAC followed by DDTaxol on 03/21/20    Locoregional therapy  -s/p Right mastectomy with SLN and prophylactic left mastectomy  -Pathology consistent with residual disease, MD Dareen Piano residual cancer burden class: RCB-III  With 2/3 positive LNs    - ALND completed on 05/07/2020-1/16 lymph nodes involved. Total 3 LNs involved by metastatic adenocarcinoma  -Completed adjuvant RT on 08/06/2020  -Completed tissue expander exchange for permanent implants, liposuction from abdomen and fat grafting to bilateral breasts on 08/13/2021    Adjuvant therapy  Ovarian suppression (lupron) + Letrozole - SOT 08/09/2020  Adjuvant Abemaciclib - SOT 08/29/2020. Dose reduced to 100mg  BID in June 2022 to improve tolerance. She reports tolerating well.   -Continue Abemaciclib 100 mg BID; plan to complete in May 2024 due to some treatment breaks while on therapy.     Staging scans  -Patient had baseline staging scans which are negative for any metastatic disease.   -Repeat scans on March 2022 showed no evidence of metastasis; ( this was completed in the setting of the patient needing reassurance)     #Hot flashes  -Patient endorses hot flashes since initiation of aromatase inhibition therapy; she reports taking vit E which has been helpful.   -Discussed pharmacologic options including oxybutynin, gabapentin, or cross-tapering escitalopram (managed by PCP for anxiety) for venlafaxine, but patient continues opts for no pharmacologic therapy at this time    #Hx of Syncope/Presyncope  -Patient has had multiple episodes of presyncope with tunnel vision and one episode of syncope with preceding tunnel vision and lightheadedness all occurring approximately 4 to 5 days after chemo infusions.  No positional component and she is trying to stay well-hydrated.   -Extensive work-up has been unrevealing, including labs, chest CTA, brain MRI, CT head.  TTE prior to start of chemotherapy showed structurally normal heart. EKG was also normal at the time and 2 week Zio-patch w/o evidence of any concerning arrhythmia  -Overall, presentation seems most consistent with vasovagal etiology, unlikely to be cardiogenic. No symptoms since she was done with [facility].  - Cardiology following - followup limited TTE unremarkable  -Resolved    # Bone health  -Baseline bone mineral density evaluation prior to starting an aromatase inhibitor was normal  -She is scheduled for April 2024      # Supportive care  - Psychosocial: Supportive mother and husband. Follows with [facility] and [facility]  -  Heartburn: Daily PPI prescribed  -Hotflashes: as above  -Lymphedema: she notes less lymphedema and less restriction in her right arm since the fat grafting, seeing PT again and this has been effective.   -Diarrhea: Imodium as needed.  No complaints today      DISPO:  --Fu in 3 months, labs prior   --Continue lupron monthly with nurse visits    -----------------------------------------------------------------------------------------------------------------------------------------------    HPI: Tami Gibson is a 32 y.o. female who is seen for routine follow-up of management of breast cancer.     Since last visit she reports dx with diverticulitis.  She reports had blood in stool, and last Saturday. She went to ED to seek care and was evaluated; CT a/p did not short recurrent diverticulitis but did show evidence of constipation. She has added laxatives to her bowel regimen, was able to have adequate BM and symptoms have resolved.  She reports she will have a follow up colonoscopy later this year.   Otherwise she reports feeling well.  She denies any new or unusual  bone pain  She continues PT for lymphedema; she currently going weekly. She has sleeve and compression pump.  She denies shortness of breath and no coughing.   She denies vaginal bleeding.     --Performance status=0    Breast Cancer History  Hematology/Oncology History Overview Note   32 year old female with recently self palpated right breast lump. A targeted right breast ultrasound on 11/15/2019 showed an irregular mass with spiculated margins at the 7:00 position, 6 cm from the nipple, measuring 1.4 x 1.2 x 1.0 cm. There were 2 abnormal level 1 axillary lymph nodes measuring 1.4 x 1.3 cm and 1.2 x 0.9 cm. Additionally, there was a high level 1/level 2 axillary lymph node, measuring 2.1 x 1.3 cm with an effaced fatty hilum.      Malignant neoplasm of right breast in female, estrogen receptor positive (CMS-HCC)   11/09/2019 Initial Diagnosis    Malignant neoplasm of right breast in female, estrogen receptor positive (CMS-HCC)     11/15/2019 Biopsy    Invasive ductal carcinoma  - Nottingham combined histologic grade: 1  ER 91-100%, PR 91-100%, HER2 negative by IHC    Lymph node, right axilla, core biopsy  - Lymph node positive for metastatic carcinoma,      12/02/2019 -  Cancer Staged Staging form: Breast, AJCC 8th Edition  - Clinical: Stage IB (cT1c, cN1, cM0, G1, ER+, PR+, HER2-) - Signed by [name], MD on 12/02/2019       12/09/2019 - 03/22/2020 Chemotherapy    OP BREAST AC (Dose Dense) q2W X 4 CYCLES, THEN PACLITAXEL (DOSE DENSE) Q2W x 4 CYCLES  DOXOrubicin 60 mg/m2 IV on day 1, cyclophosphamide 600 mg/m2 IV on day 1, every 2 weeks for 4 cycles, then PACLitaxel 175 mg/m2 every 2 weeks for 4 cycles      04/20/2020 Surgery    Breast, right, simple mastectomy  -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 Extracapsular extension is identified      Breast, left, simple mastectomy  -Benign breast tissue with no atypia, in situ or invasive carcinoma identified     04/20/2020 -  Cancer Staged    Staging form: Breast, AJCC 8th Edition  - Pathologic stage from 04/20/2020: No Stage Recommended (ypT1c, pN1a, cM0, G2, ER+, PR+, HER2-) - Signed  by [name], MD on 04/25/2020       05/07/2020 Surgery    Lymph nodes, right axillary, dissection  -One of sixteen lymph nodes involved by metastatic adenocarcinoma (1/16)  -Metastatic focus measures 3 mm in greatest dimension  -No extracapsular extension is identified     06/28/2020 - 08/06/2020 Radiation    The total radiation dose will be 5000 cGy at 200 cGy/fraction for a total of 25 fractions, treated once a day     08/13/2020 Endocrine/Hormone Therapy    Letrozole 2.5mg  +Ovarian suppression (monthly lupron)     Malignant neoplasm of right breast in female, estrogen receptor positive (CMS-HCC)   05/07/2020 Surgery    Lymph nodes, right axillary, dissection  -One of sixteen lymph nodes involved by metastatic adenocarcinoma (1/16)  -Metastatic focus measures 3 mm in greatest dimension  -No extracapsular extension is identified     06/13/2020 -  Radiation    Radiation Therapy Treatment Details (Noted on 06/13/2020)  Site: Right Breast - Overlapping sites  Technique: 3D CRT  Goal: No goal specified  Planned Treatment Start Date: No planned start date specified     06/21/2020 -  Radiation    Radiation Therapy Treatment Details (Noted on 06/21/2020)  Site: Right Breast - Overlapping sites  Technique: 3D CRT  Goal: No goal specified  Planned Treatment Start Date: No planned start date specified         Past Medical History  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    [facility]    Gestational hypertension 01/30/2017    end of pregnancy had hypertension, was induced    Headache     migraine occasionally    Hypertension     during week 37 week, pt induced and had C/S due to hypertension    IIH (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/2015    followed by ophthalmologist    Kidney stone     Malignant neoplasm of right breast in female, 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/30/2019    Pyelonephritis 09/2016    Kidney stones    Urinary tract infection     Had 5-6 in Lifetime, esp high school and college age    Varicella     during childhood    Vitamin B 12 deficiency        Reproductive/GYN History  OB History   Gravida Para Term Preterm AB Living   2 2 2  0 0 2   SAB IAB Ectopic Molar Multiple Live Births   0 0 0 0 0 2      # Outcome Date GA Lbr Len/2nd Weight Sex Delivery Anes PTL Lv   2 Term 02/17/19 [redacted]w[redacted]d  3545 g (7 lb 13 oz) F CS-LTranv Spinal, EPI N LIV      Complications: Failure to Progress in First Stage      Name: Martorana,Tami Gibson      Apgar1: 8  Apgar5: 9   1 Term 01/30/17 [redacted]w[redacted]d  2875 g (6 lb 5.4 oz) F CS-LTranv Spinal, EPI N LIV      Complications: Hypertension, Breech birth      Name: Santizo,OAKLEY      Apgar1: 8  Apgar5: 9      Obstetric Comments   OB-History reviewed by Delia Heady  Bonker RN on 02/22/2019.   Gardasil series completed   Last pap:  2017; NIL   No abn paps   No hx STI's       Surgical History  Past Surgical History:   Procedure Laterality Date    BREAST BIOPSY Right     IDC    CESAREAN SECTION  01/30/2017    breech    CHEMOTHERAPY      IR INSERT PORT AGE GREATER THAN 5 YRS  12/08/2019    IR INSERT PORT AGE GREATER THAN 5 YRS 12/08/2019 Jobe Gibbon, MD IMG VIR [facility]&V [facility]    LUMBAR PUNCTURE DIAGNOSTIC [facility] HISTORICAL RESULT)  01/2015    to relieve ICP    PR BX/REMV,LYMPH NODE,DEEP AXILL Right 04/20/2020    Procedure: BX/EXC LYMPH NODE; OPEN, DEEP AXILRY NODE;  Surgeon: Moss Mc, MD;  Location: MAIN OR [facility];  Service: Surgical Oncology Breast    PR CESAREAN DELIVERY ONLY N/A 01/30/2017    Procedure: CESAREAN DELIVERY ONLY;  Surgeon: Asher Muir, MD;  Location: L&D C-SECTION OR SUITES [facility];  Service: Maternal-Fetal Medicine    PR CESAREAN DELIVERY ONLY N/A 02/17/2019    Procedure: CESAREAN DELIVERY ONLY;  Surgeon: Lonny Prude, MD;  Location: L&D C-SECTION OR SUITES [facility];  Service: Maternal-Fetal Medicine    PR GRAFTING OF AUTOLOGOUS FAT BY LIPO 50 CC OR LESS Bilateral 04/16/2022    Procedure: GRAFTING OF AUTOLOGOUS FAT HARVESTED BY LIPOSUCTION TECHNIQUE TO TRUNK, BREASTS, SCALP, ARMS, AND/OR LEGS; 50 CC OR LESS INJECTATE;  Surgeon: Arsenio Katz, MD;  Location: MAIN OR [place];  Service: Plastics    PR IMPLNT BIO IMPLNT FOR SOFT TISSUE REINFORCEMENT Bilateral 04/20/2020    Procedure: IMPLANTATION BIOLOGIC IMPLANT(EG, ACELLULAR DERMAL MATRIX) FOR SOFT TISSUE REINFORCEMENT(EG, BREAST, TRUNK);  Surgeon: Arsenio Katz, MD;  Location: MAIN OR [facility];  Service: Plastics    PR INSJ/RPLCMT BREAST IMPLANT SEP DAY MASTECTOMY Right 04/16/2022    Procedure: INSERTION OR REPLACEMENT OF BREAST IMPLANT ON SEPERATE DAY FROM MASTECTOMY;  Surgeon: Arsenio Katz, MD;  Location: MAIN OR [place];  Service: Plastics    PR INTRAOPERATIVE SENTINEL LYMPH NODE ID W DYE INJECTION Right 04/20/2020    Procedure: INTRAOPERATIVE IDENTIFICATION SENTINEL LYMPH NODE(S) INCLUDE INJECTION NON-RADIOACTIVE DYE, WHEN PERFORMED;  Surgeon: Moss Mc, MD;  Location: MAIN OR [place];  Service: Surgical Oncology Breast    PR INTRAOPERATIVE SENTINEL LYMPH NODE ID W DYE INJECTION Right 05/07/2020    Procedure: INTRAOPERATIVE IDENTIFICATION SENTINEL LYMPH NODE(S) INCLUDE INJECTION NON-RADIOACTIVE DYE, WHEN PERFORMED;  Surgeon: Moss Mc, MD;  Location: MAIN OR [place];  Service: Surgical Oncology Breast    PR MASTECTOMY, SIMPLE, COMPLETE Bilateral 04/20/2020    Procedure: MASTECTOMY, SIMPLE, COMPLETE;  Surgeon: Moss Mc, MD;  Location: MAIN OR [facility];  Service: Surgical Oncology Breast    PR REMOVE ARMPITS LYMPH NODES COMPLT Right 05/07/2020    Procedure: AXILLARY LYMPHADENECTOMY; COMPLETE;  Surgeon: Moss Mc, MD;  Location: MAIN OR [facility];  Service: Surgical Oncology Breast    PR REPLACEMENT TISSUE EXPANDER W/PERMANENT IMPLANT Bilateral 08/13/2021    Procedure: REPLACEMENT OF TISSUE EXPANDER WITH PERMANENT IMPLANT;  Surgeon: Arsenio Katz, MD;  Location: MAIN OR [facility];  Service: Plastics    PR REVISION OF RECONSTRUCTED BREAST Bilateral 04/16/2022    Procedure: REVISION OF RECONSTRUCTED BREAST;  Surgeon: Arsenio Katz, MD;  Location: MAIN OR [facility];  Service: Plastics    PR TISSUE EXPANDER PLACEMENT BREAST RECONSTRUCTION Bilateral 04/20/2020  Procedure: TISSUE EXPANDER PLACEMENT IN BREAST RECONSTRUCTION, INCLUDING SUBSEQUENT EXPANSION(S);  Surgeon: Arsenio Katz, MD;  Location: MAIN OR [facility];  Service: Plastics    TONSILECTOMY, ADENOIDECTOMY, BILATERAL MYRINGOTOMY AND TUBES      WISDOM TOOTH EXTRACTION  2004       Medications    Current Outpatient Medications:     abemaciclib (VERZENIO) 100 mg Tab tablet, Take 1 tablet (100 mg total) by mouth Two (2) times a day. Take with or without food., Disp: 56 tablet, Rfl: 5    acetaminophen (TYLENOL) 325 MG tablet, Take 2 tablets (650 mg total) by mouth every six (6) hours as needed for pain., Disp: , Rfl:     calcium carbonate (TUMS) 200 mg calcium (500 mg) chewable tablet, Chew 1 tablet (200 mg of elem calcium total) daily., Disp: , Rfl:     cetirizine (ZYRTEC) 10 MG tablet, Take 1 tablet (10 mg total) by mouth daily., Disp: 30 tablet, Rfl: 2    cholecalciferol, vitamin D3-25 mcg, 1,000 unit,, 25 mcg (1,000 unit) capsule, Take 1 capsule (25 mcg total) by mouth., Disp: , Rfl:     cyanocobalamin, vitamin B-12, 1000 MCG tablet, Take 1 tablet (1,000 mcg total) by mouth daily., Disp: , Rfl:     docusate sodium (COLACE) 100 MG capsule, Take 1 capsule (100 mg total) by mouth nightly as needed (PRN)., Disp: , Rfl:     escitalopram oxalate (LEXAPRO) 20 MG tablet, Take 1 tablet (20 mg total) by mouth daily., Disp: 90 tablet, Rfl: 3    leuprolide (LUPRON) 3.75 mg injection, Inject 3.75 mg into the muscle., Disp: , Rfl:     omeprazole (PRILOSEC) 20 MG capsule, Take 1 capsule by mouth once daily, Disp: 90 capsule, Rfl: 0    rimegepant (NURTEC ODT) 75 mg TbDL, Take 1 tablet (75 mg total) by mouth daily as needed., Disp: , Rfl:     SUMAtriptan (IMITREX) 100 MG tablet, TAKE ONE TABLET BY MOUTH AT ONSET OF MIGRAINE FOR UP TO 1 DOSE . IF SYMPTOMS PERSIST A SECOND DOSE MAY BE TAKEN IN 2 HOURS IF NEEDED (Patient not taking: Reported on 06/27/2022), Disp: , Rfl:     topiramate (TOPAMAX) 25 MG tablet, TAKE 3 TABLETS BY MOUTH ONCE DAILY, Disp: , Rfl:     traZODone (DESYREL) 50 MG tablet, TAKE 1/2 TO 1 (ONE-HALF TO ONE) TABLET BY MOUTH NIGHTLY AS NEEDED FOR SLEEP, Disp: 30 tablet, Rfl: 0    vitamin E-134 mg, 200 UNIT, 134 mg (200 UNIT) capsule, Take 1 capsule (134 mg total) by mouth daily., Disp: , Rfl:     WEGOVY 1.7 MG/0.75 ML SUBCUTANEOUS PEN INJECTOR, INJECT 0.75ML (1.7MG  TOTAL) ML SUBCUTANEOUSLY  ONCE A WEEK, Disp: 4 mL, Rfl: 0    letrozole (FEMARA) 2.5 mg tablet, Take 1 tablet (2.5 mg total) by mouth daily., Disp: 90 tablet, Rfl: 3    ondansetron (ZOFRAN-ODT) 4 MG disintegrating tablet, Take 1 tablet in mouth every 8 hours as needed for nausea, Disp: 18 tablet, Rfl: 0  No current facility-administered medications for this visit.    Facility-Administered Medications Ordered in Other Visits:     leuprolide (LUPRON) 3.75 mg injection, , , ,     Allergies  No Known Allergies    Personal and Social History  Social History     Social History Narrative    PRECONCEPTUAL ASSESSMENT:        Tami Gibson is a 32 y.o. Caucasian female    Caffeine:denies use    Cats:  no    Exercise: not active    Diet: balanced    Family hx of of birth defects, chromosomal abnormality, intellectual disability, developmental delay: no.            PARTNER HISTORY:     Domingo Dimes is a 63 y.o.  Caucasian female.    Occupation:  Immunologist; [employer]    He has fathered children:  no    He is healthy with no chronic illness    Family history of  birth defects, chromosomal abnormality, intellectual disability, or developmental delay: no.                Family History  Family History   Problem Relation Age of Onset    Cancer Maternal Aunt 45        breast Ca    Hypertension Father     Other Father 55        Benign brain tumor    Hypothyroidism Mother     Cancer Paternal Aunt         Breast Ca dx in 25s    ALS Maternal Aunt     Ovarian cancer Maternal Grandmother 56    No Known Problems Maternal Grandfather     Cancer Paternal Grandmother         Unknown Cancer    No Known Problems Paternal Grandfather     No Known Problems Maternal Aunt     No Known Problems Maternal Aunt     Breast cancer Maternal Cousin         Dx in 72s    No Known Problems Paternal Aunt     No Known Problems Daughter     No Known Problems Daughter     No Known Problems Other     BRCA 1/2 Neg Hx     Colon cancer Neg Hx     Endometrial cancer Neg Hx          Review of Systems: A 12-system review of systems was obtained including: Constitutional, Eyes, ENT, Cardiovascular, Respiratory, GI, GU, Musculoskeletal, Skin, Neurological, Psychiatric, Endocrine, Heme/Lymphatic, and Allergic/Immunologic systems. It is negative or non-contributory to the patient???s management except for the following: See Interval History.    Physical Examination:  Vital Signs: BP 111/84  - Pulse 100  - Temp 36.9 ??C (98.4 ??F) (Oral)  - Resp 18  - Ht 165.1 cm (5' 5)  - Wt 89.9 kg (198 lb 3.2 oz)  - LMP 12/15/2019 Comment: luoprone injection - SpO2 96%  - BMI 32.98 kg/m??   General:  Healthy-appearing female in no acute distress..  Cardiovasc:  No heaves, regular, no additional sounds. No lower extremity edema.    Respiratory:  Chest clear to percussion and auscultation, unlabored  Gastrointestinal:  Soft, nontender, no hepatomegaly.   Musculoskeletal:  No bony pain or tenderness.   Skin and Subcutaneous Tissues: No significant rash.  Psychiatric: Mood is normal.  No other symptoms.   Neuro:  Alert and oriented.  Gait and coordination normal  Breasts: Bilateral mastectomies with implants in place. Well healed scars.  Heme/Lymphatic/Immunologic: No supraclavicular or cervical adenopathy    I have personally reviewed the following diagnostic studies:    Breast imaging and pathology reviewed. See Results for details

## 2022-06-24 NOTE — Unmapped (Signed)
[facility] THERAPY SERVICES [place]  OUTPATIENT PHYSICAL THERAPY  06/24/2022  Note Type: Treatment Note       Patient Name: Tami Gibson  Date of Birth:1991/03/16  Diagnosis:   Encounter Diagnoses   Name Primary?    Lymphedema syndrome, postmastectomy Yes    Malignant neoplasm of right breast in female, estrogen receptor positive, unspecified site of breast (CMS-HCC)     Scar condition and fibrosis of skin     Lymphedema      Referring MD:  [name], MD    Date of Onset of Impairment-No date available  Date PT Care Plan Established or Reviewed-No date available  Date PT Treatment Started-No date available   Plan of Care Effective Date:     Hematology/Oncology History Overview Note   32 year old female with recently self palpated right breast lump. A targeted right breast ultrasound on 11/15/2019 showed an irregular mass with spiculated margins at the 7:00 position, 6 cm from the nipple, measuring 1.4 x 1.2 x 1.0 cm. There were 2 abnormal level 1 axillary lymph nodes measuring 1.4 x 1.3 cm and 1.2 x 0.9 cm. Additionally, there was a high level 1/level 2 axillary lymph node, measuring 2.1 x 1.3 cm with an effaced fatty hilum.      Malignant neoplasm of right breast in female, estrogen receptor positive (CMS-HCC)   11/09/2019 Initial Diagnosis    Malignant neoplasm of right breast in female, estrogen receptor positive (CMS-HCC)     11/15/2019 Biopsy    Invasive ductal carcinoma  - Nottingham combined histologic grade: 1  ER 91-100%, PR 91-100%, HER2 negative by IHC    Lymph node, right axilla, core biopsy  - Lymph node positive for metastatic carcinoma,      12/02/2019 -  Cancer Staged    Staging form: Breast, AJCC 8th Edition  - Clinical: Stage IB (cT1c, cN1, cM0, G1, ER+, PR+, HER2-) - Signed by [name], MD on 12/02/2019       12/09/2019 - 03/22/2020 Chemotherapy    OP BREAST AC (Dose Dense) q2W X 4 CYCLES, THEN PACLITAXEL (DOSE DENSE) Q2W x 4 CYCLES  DOXOrubicin 60 mg/m2 IV on day 1, cyclophosphamide 600 mg/m2 IV on day 1, every 2 weeks for 4 cycles, then PACLitaxel 175 mg/m2 every 2 weeks for 4 cycles      04/20/2020 Surgery    Breast, right, simple mastectomy  -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 Extracapsular extension is identified      Breast, left, simple mastectomy  -Benign breast tissue with no atypia, in situ or invasive carcinoma identified     04/20/2020 -  Cancer Staged    Staging form: Breast, AJCC 8th Edition  - Pathologic stage from 04/20/2020: No Stage Recommended (ypT1c, pN1a, cM0, G2, ER+, PR+, HER2-) - Signed by [name], MD on 04/25/2020       05/07/2020 Surgery    Lymph nodes, right axillary, dissection  -One of sixteen lymph nodes involved by metastatic adenocarcinoma (1/16)  -Metastatic focus measures 3 mm in greatest dimension  -No extracapsular extension is identified     06/28/2020 - 08/06/2020 Radiation    The total radiation dose will be 5000 cGy at 200 cGy/fraction for a total of 25 fractions, treated once a day     08/13/2020 Endocrine/Hormone Therapy    Letrozole 2.5mg  +Ovarian suppression (  monthly lupron)     Malignant neoplasm of right breast in female, estrogen receptor positive (CMS-HCC)   05/07/2020 Surgery    Lymph nodes, right axillary, dissection  -One of sixteen lymph nodes involved by metastatic adenocarcinoma (1/16)  -Metastatic focus measures 3 mm in greatest dimension  -No extracapsular extension is identified     06/13/2020 -  Radiation    Radiation Therapy Treatment Details (Noted on 06/13/2020)  Site: Right Breast - Overlapping sites  Technique: 3D CRT  Goal: No goal specified  Planned Treatment Start Date: No planned start date specified     06/21/2020 -  Radiation    Radiation Therapy Treatment Details (Noted on 06/21/2020)  Site: Right Breast - Overlapping sites  Technique: 3D CRT  Goal: No goal specified  Planned Treatment Start Date: No planned start date specified           Assessment/Plan:    Assessment  Assessment details:    Myofascial restriction and muscle tightness in R axilla, shoulder, pec; improved following treatment. Limb volume has decreased nicely.  32 y.o. female presents with Stage I lymphedema secondary to breast cancer treatment.  In addition to lymphedema , the patient is experiencing these side effects from cancer treatments: Myofascial/scar restrictions post op  Radiation: Fibrosis/myofascial restrictions  Currently the right limb is 7 % bigger than the left limb.   Patient requires skilled Physical Therapy services  for the following problem list and secondary functional limitations:    Problem List:   Lack of knowledge of lymphedema/edema self care  Uncontrolled swelling and/or lack of appropriate compression garment    Secondary Functional Limitations:  Decreased knowledge of self care  of lymphedema/edema puts patient at risk for increased swelling and infection  Uncontrolled swelling can increase chances of  infection, and limit functional ROM             Impairments: postural weakness, increased edema, impaired sensation, muscular restrictions, uncontrolled swelling, impaired ADLs, decreased skin integrity and decreased knowledge of self-care                Prognosis: excellent prognosis    Positive Prognosis Rationale: motivated for treatment, caregiver/family support, chronicity of condition, severity of symptoms, response to trial tx and previous tx with benefit.    Barriers to therapy: none identified    Therapy Goals      Goals:      Short Term Goals to be addressed in 4 weeks:  1. Pt will be independent with home stretching, ROM HEP to allow progression towards long-term goals, potentially increased comfort, ROM.   2. Pt will participate in MLD, be educated in self MLD to promote improved fluid transport, potentially decreased R breast and/or UE edema.  3. Pt will be able to articulate skin care recommendations, as well as signs/symptoms of infection, to promote long-term skin health, infection prevention, long-term maintenance of condition.    Long-Term Goals to be addressed in 8 weeks:  1. Pt will receive assistance with compression garment fitting and procurement, allowing potentially increased comfort, long-term maintenance of condition.   2. Patient to demonstrate reduction in R UE limb volume to no more than 12% larger than L UE.      Plan    Therapy options: will be seen for skilled physical therapy services    Planned therapy interventions: Body Mechanics Training, Compression Bandaging, Compression Pump, Diaphragmatic/Pursed-lip Breathing, Dry Needling, E-Stim, Education Therapist, art, Education - Patient, Endurance Activites, Functional Mobility, Arboriculturist, Home Exercise Program, Manual  Lymph Drainage, Manual Therapy, Neuromuscular Re-education, Postural Training, Self-Care/Home Training, Taping, TENS, Therapeutic Activities and Therapeutic Exercises      Frequency: 1x week    Duration in weeks: 8 weeks    Education provided to: patient.    Education provided: Equipment recommendations, HEP, Garment options, Manual lymph drainage and Treatment options and plan    Education results: needs reinforcement and needs further instruction.      Next visit plan:        MLD    Total Session Time: 55    Treatment rendered today:      ______________________________________________________________________    Manual Therapy x 55'  -soft tissue mobilization to R axilla including stretching, fascial mobilization, trigger point release, scar tissue mobilization  -MLD, including   Short neck  L axillary  R inguinal  R to L anterior A-A anastamosis  R A-I anastamosis  Posterior A-A anastamosis  R UE  R breast          ______________________________________________________________________            Subjective:   History of Present Condition  Date of Surgery: 05/07/2020 (ALND)    History of Present Condition/Chief Complaint:       R UE lymphedema    Subjective:     Doing well overall, had trouble getting her arm up for CT again but ROM has been fine outside of that. Massager is helping.     Therapist wore a mask for the entire session.     I reviewed the no-show/attendance policy with the patient and caregiver(s). The patient and/or family is aware that they must call to cancel appointments more than 24 hours in advance. They are also aware that if they late cancel (less than 24 hours from appointment), arrives greater than 15 minutes late, or no-show three times, we reserve the right to cancel their remaining appointments. This policy is in place to allow [place] to best serve the needs of our caseload.      Quality of life: good    Pain  Current pain rating: 0  At best pain rating: 0  At worst pain rating: 4  Location: R arm above elbow      Precautions and Equipment  Precautions: Cancer history  Prior Functional Status:     Functional Limitation(s)-No physical limitations  Current functional status: limited recreation and limited exercise        Treatments  Previous treatment: physical therapy      Patient Goals  Patient goals for therapy: decreased edema  Patient goal: manage symptoms before surgery      Objective:   Lymphedema                       Inital values taken: 02/28/2022  9:01 AM   Involved arm: Right   Dominant arm: Right   Patient position: Seated   Total volume: right 4355.41 mL 02/28/2022  9:01 AM   Total volume: left 3720.41 mL 02/28/2022  9:01 AM   L/R difference +635.00 mL +17.07 %          Inital values taken: 02/28/2022  9:01 AM   Involved arm: Right   Dominant arm: Right   Patient position: Seated   Total volume: right 4355.41 mL 02/28/2022  9:01 AM   Total volume: left 3720.41 mL 02/28/2022  9:01 AM   L/R difference +635.00 mL +17.07 %  I attest that I have reviewed the above information.  Signed: Edwena Bunde, PT  06/24/2022 7:38 AM

## 2022-06-25 ENCOUNTER — Ambulatory Visit: Admit: 2022-06-25 | Discharge: 2022-06-25 | Payer: PRIVATE HEALTH INSURANCE

## 2022-06-25 DIAGNOSIS — C50911 Malignant neoplasm of unspecified site of right female breast: Principal | ICD-10-CM

## 2022-06-25 DIAGNOSIS — Z170 Estrogen receptor positive status [ER+]: Principal | ICD-10-CM

## 2022-06-25 MED ORDER — ONDANSETRON 4 MG DISINTEGRATING TABLET
ORAL_TABLET | 0 refills | 0 days | Status: CP
Start: 2022-06-25 — End: ?

## 2022-06-25 MED ORDER — LETROZOLE 2.5 MG TABLET
ORAL_TABLET | Freq: Every day | ORAL | 3 refills | 90 days | Status: CP
Start: 2022-06-25 — End: ?

## 2022-06-27 ENCOUNTER — Ambulatory Visit
Admit: 2022-06-27 | Discharge: 2022-06-28 | Payer: PRIVATE HEALTH INSURANCE | Attending: Adult Health | Primary: Adult Health

## 2022-06-27 NOTE — Unmapped (Signed)
It was a pleasure to see you today in the Breast Surgical Oncology Clinic. Please call my nurse navigator, Alinda Dooms or Willia Craze, if you have any interval questions or concerns.     Follow up in 1 year for exam.  Continue surveillance as outlined. NCCN recommends that you continue to see your primary care provider for all general health care recommended for a patient your age, including cancer screening tests.  Any symptoms should be brought to the attention of your provider.  You should see your medical oncologist every 3 to 6 months for the first 3 years, every 6 to 12 months for years 4 and 5, and annually thereafter. Visits with your surgeon and radiation oncologist should be rotated with your medical oncology team initially until you are released to medical oncology or a survivorship clinic. The surgery visit should typically be paired with the imaging appointment so that we can review mammogram findings and recommendations.       Surgical oncology contact information:  For appointments, please call [phone].     Nurse Navigator available Monday through Friday 8:30 am to 4:30 pm:   Alinda Dooms, RN or Willia Craze, RN:    Phone: [phone]   Fax: [phone] attention Alinda Dooms, RN or Willia Craze, RN    For emergencies, evenings or weekends, please call [phone] and ask for the surgical oncology resident on call. Please be aware that this person is also responding to in-hospital emergencies and patient issues and may not answer your phone call immediately, but will return your call as soon as possible.

## 2022-07-01 ENCOUNTER — Ambulatory Visit
Admit: 2022-07-01 | Payer: PRIVATE HEALTH INSURANCE | Attending: Rehabilitative and Restorative Service Providers" | Primary: Rehabilitative and Restorative Service Providers"

## 2022-07-01 ENCOUNTER — Ambulatory Visit
Admit: 2022-07-01 | Discharge: 2022-07-27 | Payer: PRIVATE HEALTH INSURANCE | Attending: Rehabilitative and Restorative Service Providers" | Primary: Rehabilitative and Restorative Service Providers"

## 2022-07-01 NOTE — Unmapped (Signed)
[facility] THERAPY SERVICES [place]  OUTPATIENT PHYSICAL THERAPY  07/01/2022  Note Type: Treatment Note       Patient Name: Tami Gibson  Date of Birth:1991-01-13  Diagnosis:   Encounter Diagnoses   Name Primary?    Lymphedema syndrome, postmastectomy Yes    Malignant neoplasm of right breast in female, estrogen receptor positive, unspecified site of breast (CMS-HCC)     Scar condition and fibrosis of skin      Referring MD:  [name], MD    Date of Onset of Impairment-No date available  Date PT Care Plan Established or Reviewed-No date available  Date PT Treatment Started-No date available   Plan of Care Effective Date:     Hematology/Oncology History Overview Note   32 year old female with recently self palpated right breast lump. A targeted right breast ultrasound on 11/15/2019 showed an irregular mass with spiculated margins at the 7:00 position, 6 cm from the nipple, measuring 1.4 x 1.2 x 1.0 cm. There were 2 abnormal level 1 axillary lymph nodes measuring 1.4 x 1.3 cm and 1.2 x 0.9 cm. Additionally, there was a high level 1/level 2 axillary lymph node, measuring 2.1 x 1.3 cm with an effaced fatty hilum.      Malignant neoplasm of right breast in female, estrogen receptor positive (CMS-HCC)   11/09/2019 Initial Diagnosis    Malignant neoplasm of right breast in female, estrogen receptor positive (CMS-HCC)     11/15/2019 Biopsy    Invasive ductal carcinoma  - Nottingham combined histologic grade: 1  ER 91-100%, PR 91-100%, HER2 negative by IHC    Lymph node, right axilla, core biopsy  - Lymph node positive for metastatic carcinoma,      12/02/2019 -  Cancer Staged    Staging form: Breast, AJCC 8th Edition  - Clinical: Stage IB (cT1c, cN1, cM0, G1, ER+, PR+, HER2-) - Signed by [name], MD on 12/02/2019       12/09/2019 - 03/22/2020 Chemotherapy    OP BREAST AC (Dose Dense) q2W X 4 CYCLES, THEN PACLITAXEL (DOSE DENSE) Q2W x 4 CYCLES  DOXOrubicin 60 mg/m2 IV on day 1, cyclophosphamide 600 mg/m2 IV on day 1, every 2 weeks for 4 cycles, then PACLitaxel 175 mg/m2 every 2 weeks for 4 cycles      04/20/2020 Surgery    Breast, right, simple mastectomy  -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 Extracapsular extension is identified      Breast, left, simple mastectomy  -Benign breast tissue with no atypia, in situ or invasive carcinoma identified     04/20/2020 -  Cancer Staged    Staging form: Breast, AJCC 8th Edition  - Pathologic stage from 04/20/2020: No Stage Recommended (ypT1c, pN1a, cM0, G2, ER+, PR+, HER2-) - Signed by [name], MD on 04/25/2020       05/07/2020 Surgery    Lymph nodes, right axillary, dissection  -One of sixteen lymph nodes involved by metastatic adenocarcinoma (1/16)  -Metastatic focus measures 3 mm in greatest dimension  -No extracapsular extension is identified     06/28/2020 - 08/06/2020 Radiation    The total radiation dose will be 5000 cGy at 200 cGy/fraction for a total of 25 fractions, treated once a day     08/13/2020 Endocrine/Hormone Therapy    Letrozole 2.5mg  +Ovarian suppression (monthly lupron)  Malignant neoplasm of right breast in female, estrogen receptor positive (CMS-HCC)   05/07/2020 Surgery    Lymph nodes, right axillary, dissection  -One of sixteen lymph nodes involved by metastatic adenocarcinoma (1/16)  -Metastatic focus measures 3 mm in greatest dimension  -No extracapsular extension is identified     06/13/2020 -  Radiation    Radiation Therapy Treatment Details (Noted on 06/13/2020)  Site: Right Breast - Overlapping sites  Technique: 3D CRT  Goal: No goal specified  Planned Treatment Start Date: No planned start date specified     06/21/2020 -  Radiation    Radiation Therapy Treatment Details (Noted on 06/21/2020)  Site: Right Breast - Overlapping sites  Technique: 3D CRT  Goal: No goal specified  Planned Treatment Start Date: No planned start date specified           Assessment/Plan:    Assessment  Assessment details:    Myofascial restriction and muscle tightness in R axilla, shoulder, pec; improved following treatment. Patient required some tactile and verbal cuing for scap squeezes and chin tucks, will progress postural exercises next visit.  32 y.o. female presents with Stage I lymphedema secondary to breast cancer treatment.  In addition to lymphedema , the patient is experiencing these side effects from cancer treatments: Myofascial/scar restrictions post op  Radiation: Fibrosis/myofascial restrictions  Currently the right limb is 7 % bigger than the left limb.   Patient requires skilled Physical Therapy services  for the following problem list and secondary functional limitations:    Problem List:   Lack of knowledge of lymphedema/edema self care  Uncontrolled swelling and/or lack of appropriate compression garment    Secondary Functional Limitations:  Decreased knowledge of self care  of lymphedema/edema puts patient at risk for increased swelling and infection  Uncontrolled swelling can increase chances of  infection, and limit functional ROM             Impairments: postural weakness, increased edema, impaired sensation, muscular restrictions, uncontrolled swelling, impaired ADLs, decreased skin integrity and decreased knowledge of self-care                Prognosis: excellent prognosis    Positive Prognosis Rationale: motivated for treatment, caregiver/family support, chronicity of condition, severity of symptoms, response to trial tx and previous tx with benefit.    Barriers to therapy: none identified    Therapy Goals      Goals:      Short Term Goals to be addressed in 4 weeks:  1. Pt will be independent with home stretching, ROM HEP to allow progression towards long-term goals, potentially increased comfort, ROM.   2. Pt will participate in MLD, be educated in self MLD to promote improved fluid transport, potentially decreased R breast and/or UE edema.  3. Pt will be able to articulate skin care recommendations, as well as signs/symptoms of infection, to promote long-term skin health, infection prevention, long-term maintenance of condition.    Long-Term Goals to be addressed in 8 weeks:  1. Pt will receive assistance with compression garment fitting and procurement, allowing potentially increased comfort, long-term maintenance of condition.   2. Patient to demonstrate reduction in R UE limb volume to no more than 12% larger than L UE.      Plan    Therapy options: will be seen for skilled physical therapy services    Planned therapy interventions: Body Mechanics Training, Compression Bandaging, Compression Pump, Diaphragmatic/Pursed-lip Breathing, Dry Needling, E-Stim, Education Therapist, art, Education - Patient, Endurance Activites,  Functional Mobility, Garment Measurement, Home Exercise Program, Manual Lymph Drainage, Manual Therapy, Neuromuscular Re-education, Postural Training, Self-Care/Home Training, Taping, TENS, Therapeutic Activities and Therapeutic Exercises      Frequency: 1x week    Duration in weeks: 8 weeks    Education provided to: patient.    Education provided: Equipment recommendations, HEP, Garment options, Manual lymph drainage and Treatment options and plan    Education results: needs reinforcement and needs further instruction.      Next visit plan:        Soft tissue mobilization  Postural exercises    Total Session Time: 55    Treatment rendered today:      ______________________________________________________________________    Manual Therapy x 45'  -soft tissue mobilization to R axilla including stretching, fascial mobilization, trigger point release, scar tissue mobilization  -kinesiotape applied to R breast scars    Therapeutic Exercise x 8'  -scap squeezes 20x, verbal and tactile cuing  -chin tucks 20x, verbal and tactile cuing ______________________________________________________________________            Subjective:   History of Present Condition  Date of Surgery: 05/07/2020 (ALND)    History of Present Condition/Chief Complaint:       R UE lymphedema    Subjective:     Went swimming over the weekend with the kids (standing in water moving arms) and it felt good. Tape was fine, had to take it off the next day for a medical oncology appt but would like to try it again.     Therapist wore a mask for the entire session.     I reviewed the no-show/attendance policy with the patient and caregiver(s). The patient and/or family is aware that they must call to cancel appointments more than 24 hours in advance. They are also aware that if they late cancel (less than 24 hours from appointment), arrives greater than 15 minutes late, or no-show three times, we reserve the right to cancel their remaining appointments. This policy is in place to allow [place] to best serve the needs of our caseload.      Quality of life: good    Pain  Current pain rating: 0  At best pain rating: 0  At worst pain rating: 4  Location: R arm above elbow      Precautions and Equipment  Precautions: Cancer history  Prior Functional Status:     Functional Limitation(s)-No physical limitations  Current functional status: limited recreation and limited exercise        Treatments  Previous treatment: physical therapy      Patient Goals  Patient goals for therapy: decreased edema  Patient goal: manage symptoms before surgery      Objective:   Lymphedema                       Inital values taken: 02/28/2022  9:01 AM   Involved arm: Right   Dominant arm: Right   Patient position: Seated   Total volume: right 3415.58 mL 06/24/2022  7:43 AM   Total volume: left 3189.63 mL 06/24/2022  7:43 AM   L/R difference +225.95 mL +7.08 %   R diff from initial -939.83 mL -21.58 %   R diff from last -939.83 mL -21.58 %          Inital values taken: 02/28/2022  9:01 AM   Involved arm: Right Dominant arm: Right   Patient position: Seated   Total volume: right 3415.58 mL 06/24/2022  7:43 AM   Total volume: left 3189.63 mL 06/24/2022  7:43 AM   L/R difference +225.95 mL +7.08 %   R diff from initial -939.83 mL -21.58 %   R diff from last -939.83 mL -21.58 %                                         I attest that I have reviewed the above information.  Signed: Edwena Bunde, PT  07/01/2022 7:40 AM

## 2022-07-02 NOTE — Unmapped (Signed)
Patient Name: Tami Gibson  Patient Age: 32 y.o.  Encounter Date: 06/27/2022    Referring Physician:   Durenda Hurt, MD  [address]  [address]  [place],  [place] [address]    Primary Care Provider:  Durenda Hurt, MD    Breast Cancer Treatment Team:  Surgical Oncology: Jobe Marker, MD  Surgical Oncology NP: Pollyann Samples, NP  Radiation Oncology: Rogelio Seen, MD  Medical Oncology: Adonis Brook, MD  Plastic Surgery: Lamarr Lulas, MD  Genetics: [employer] STAT panel    Reason for Visit: Breast cancer surveillance visit    HPI:  Tami Gibson is a 32 y.o. female who presents for followup after breast cancer surgery. Below is a synopsis of past and present health information pertinent to her breast cancer diagnosis, treatment and current health status. She is unaccompanied today. Tami Gibson was last seen in clinic on 06/10/2021.  She reports overall feeling well.  She will continues on Verzenio, Femara and Lupron.  She believes she will complete her Verzenio at the end of March.  She is having some difficulty with lymphedema.  She wears compression garments.  She notes she has a migraine today.  She has been diagnosed with diverticulitis and this is gradually getting better.  She notes hot flashes controlled with Vit E.  She notes a 30 lb weight loss with Wegovy.  She is more satisfied with her cosmesis since her most recent revision.  She would like to take a year off from any surgeries, but will reconsider in 2025. Otherwise, since that visit she denies associated symptoms including pain, redness, fever, chills, breast mass, breast retraction, breast swelling or nipple discharge. No other specific complaints today. No other changes to her medical, surgical, family or social history.     Jamine initially presented with significant right breast pain.  Her PCP referred her for imaging evaluation.  Mammography/ultrasound showed a 2 cm spiculated mass in the lower outer right breast as well as at least 3 abnormal axillary nodes. Biopsy revealed right breast IDC, G1, ER/PR+ and HER2 -. At our initial visit, I determined Shamyah would need additional workup prior to final surgical planning. She had an enlarged axillary node at this time and was recommended biopsy of the node. This came back as positive for metastatic carcinoma and she was referred for neoadjuvant chemotherapy. She underwent ddAC followed by ddTaxol under the direction of Dr. Laney Pastor completed in October 2021. Given residual disease in the right axilla after neoadjuvant chemotherapy after bilateral SSM, right targeted axillary dissection (TAD) protocol and infusaport removal on 04/20/2020, the patient underwent right ALND with ARM on 05/07/2020.    Diagnosis:    Right breast infiltrating ductal carcinoma   Cancer Staging   Malignant neoplasm of right breast in female, estrogen receptor positive (CMS-HCC)  Staging form: Breast, AJCC 8th Edition  - Clinical: Stage IB (cT1c, cN1, cM0, G1, ER+, PR+, HER2-) - Signed by [name], MD on 12/02/2019  - Pathologic stage from 04/20/2020: No Stage Recommended (ypT1c, pN1a, cM0, G2, ER+, PR+, HER2-) - Signed by [name], MD on 04/25/2020      Procedure:   Bilateral skin-sparing mastectomy, right targeted axillary dissection (TAD) protocol and infusaport removal, performed on 04/20/2020.  Right axillary lymph node dissection with right axillary reverse mapping procedure, performed on 05/07/2020      Clinical Trial Participant:   No clinical trials at this time.    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       Medical history reviewed as below. I have also reviewed additional records as provided.  Hematology/Oncology History Overview Note   32 year old female with recently self palpated right breast lump. A targeted right breast ultrasound on 11/15/2019 showed an irregular mass with spiculated margins at the 7:00 position, 6 cm from the nipple, measuring 1.4 x 1.2 x 1.0 cm. There were 2 abnormal level 1 axillary lymph nodes measuring 1.4 x 1.3 cm and 1.2 x 0.9 cm. Additionally, there was a high level 1/level 2 axillary lymph node, measuring 2.1 x 1.3 cm with an effaced fatty hilum.      Malignant neoplasm of right breast in female, estrogen receptor positive (CMS-HCC)   11/09/2019 Initial Diagnosis    Malignant neoplasm of right breast in female, estrogen receptor positive (CMS-HCC)     11/15/2019 Biopsy    Invasive ductal carcinoma  - Nottingham combined histologic grade: 1  ER 91-100%, PR 91-100%, HER2 negative by IHC    Lymph node, right axilla, core biopsy  - Lymph node positive for metastatic carcinoma,      12/02/2019 -  Cancer Staged    Staging form: Breast, AJCC 8th Edition  - Clinical: Stage IB (cT1c, cN1, cM0, G1, ER+, PR+, HER2-) - Signed by [name], MD on 12/02/2019       12/09/2019 - 03/22/2020 Chemotherapy    OP BREAST AC (Dose Dense) q2W X 4 CYCLES, THEN PACLITAXEL (DOSE DENSE) Q2W x 4 CYCLES  DOXOrubicin 60 mg/m2 IV on day 1, cyclophosphamide 600 mg/m2 IV on day 1, every 2 weeks for 4 cycles, then PACLitaxel 175 mg/m2 every 2 weeks for 4 cycles      04/20/2020 Surgery    Breast, right, simple mastectomy  -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 Extracapsular extension is identified      Breast, left, simple mastectomy  -Benign breast tissue with no atypia, in situ or invasive carcinoma identified     04/20/2020 -  Cancer Staged    Staging form: Breast, AJCC 8th Edition  - Pathologic stage from 04/20/2020: No Stage Recommended (ypT1c, pN1a, cM0, G2, ER+, PR+, HER2-) - Signed by [name], MD on 04/25/2020       05/07/2020 Surgery    Lymph nodes, right axillary, dissection  -One of sixteen lymph nodes involved by metastatic adenocarcinoma (1/16)  -Metastatic focus measures 3 mm in greatest dimension  -No extracapsular extension is identified     06/28/2020 - 08/06/2020 Radiation    The total radiation dose will be 5000 cGy at 200 cGy/fraction for a total of 25 fractions, treated once a day     08/13/2020 Endocrine/Hormone Therapy    Letrozole 2.5mg  +Ovarian suppression (monthly lupron)     Malignant neoplasm of right breast in female, estrogen receptor positive (CMS-HCC)   05/07/2020 Surgery    Lymph nodes, right axillary, dissection  -One of sixteen lymph nodes involved by metastatic adenocarcinoma (1/16)  -Metastatic focus measures 3 mm in greatest dimension  -No extracapsular extension is identified     06/13/2020 -  Radiation  Radiation Therapy Treatment Details (Noted on 06/13/2020)  Site: Right Breast - Overlapping sites  Technique: 3D CRT  Goal: No goal specified  Planned Treatment Start Date: No planned start date specified     06/21/2020 -  Radiation    Radiation Therapy Treatment Details (Noted on 06/21/2020)  Site: Right Breast - Overlapping sites  Technique: 3D CRT  Goal: No goal specified  Planned Treatment Start Date: No planned start date specified       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    [facility]    Gestational hypertension 01/30/2017    end of pregnancy had hypertension, was induced    Headache     migraine occasionally    Hypertension     during week 37 week, pt induced and had C/S due to hypertension    IIH (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/2015    followed by ophthalmologist    Kidney stone     Malignant neoplasm of right breast in female, 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/30/2019    Pyelonephritis 09/2016    Kidney stones    Urinary tract infection     Had 5-6 in Lifetime, esp high school and college age    Varicella     during childhood    Vitamin B 12 deficiency      Past Surgical History:   Procedure Laterality Date    BREAST BIOPSY Right     IDC    CESAREAN SECTION  01/30/2017    breech    CHEMOTHERAPY      IR INSERT PORT AGE GREATER THAN 5 YRS  12/08/2019    IR INSERT PORT AGE GREATER THAN 5 YRS 12/08/2019 Jobe Gibbon, MD IMG VIR H&V [facility]    LUMBAR PUNCTURE DIAGNOSTIC [facility] HISTORICAL RESULT)  01/2015    to relieve ICP    PR BX/REMV,LYMPH NODE,DEEP AXILL Right 04/20/2020    Procedure: BX/EXC LYMPH NODE; OPEN, DEEP AXILRY NODE;  Surgeon: Moss Mc, MD;  Location: MAIN OR [facility];  Service: Surgical Oncology Breast    PR CESAREAN DELIVERY ONLY N/A 01/30/2017    Procedure: CESAREAN DELIVERY ONLY;  Surgeon: Asher Muir, MD;  Location: L&D C-SECTION OR SUITES [facility];  Service: Maternal-Fetal Medicine    PR CESAREAN DELIVERY ONLY N/A 02/17/2019    Procedure: CESAREAN DELIVERY ONLY;  Surgeon: Lonny Prude, MD;  Location: L&D C-SECTION OR SUITES [facility];  Service: Maternal-Fetal Medicine    PR GRAFTING OF AUTOLOGOUS FAT BY LIPO 50 CC OR LESS Bilateral 04/16/2022    Procedure: GRAFTING OF AUTOLOGOUS FAT HARVESTED BY LIPOSUCTION TECHNIQUE TO TRUNK, BREASTS, SCALP, ARMS, AND/OR LEGS; 50 CC OR LESS INJECTATE;  Surgeon: Arsenio Katz, MD;  Location: MAIN OR [place];  Service: Plastics    PR IMPLNT BIO IMPLNT FOR SOFT TISSUE REINFORCEMENT Bilateral 04/20/2020    Procedure: IMPLANTATION BIOLOGIC IMPLANT(EG, ACELLULAR DERMAL MATRIX) FOR SOFT TISSUE REINFORCEMENT(EG, BREAST, TRUNK);  Surgeon: Arsenio Katz, MD;  Location: MAIN OR [facility];  Service: Plastics    PR INSJ/RPLCMT BREAST IMPLANT SEP DAY MASTECTOMY Right 04/16/2022    Procedure: INSERTION OR REPLACEMENT OF BREAST IMPLANT ON SEPERATE DAY FROM MASTECTOMY;  Surgeon: Arsenio Katz, MD;  Location: MAIN OR [facility];  Service: Plastics    PR INTRAOPERATIVE  SENTINEL LYMPH NODE ID W DYE INJECTION Right 04/20/2020    Procedure: INTRAOPERATIVE IDENTIFICATION SENTINEL LYMPH NODE(S) INCLUDE INJECTION NON-RADIOACTIVE DYE, WHEN PERFORMED;  Surgeon: Moss Mc, MD;  Location: MAIN OR [place];  Service: Surgical Oncology Breast    PR INTRAOPERATIVE SENTINEL LYMPH NODE ID W DYE INJECTION Right 05/07/2020    Procedure: INTRAOPERATIVE IDENTIFICATION SENTINEL LYMPH NODE(S) INCLUDE INJECTION NON-RADIOACTIVE DYE, WHEN PERFORMED;  Surgeon: Moss Mc, MD;  Location: MAIN OR [place];  Service: Surgical Oncology Breast    PR MASTECTOMY, SIMPLE, COMPLETE Bilateral 04/20/2020    Procedure: MASTECTOMY, SIMPLE, COMPLETE;  Surgeon: Moss Mc, MD;  Location: MAIN OR [facility];  Service: Surgical Oncology Breast    PR REMOVE ARMPITS LYMPH NODES COMPLT Right 05/07/2020    Procedure: AXILLARY LYMPHADENECTOMY; COMPLETE;  Surgeon: Moss Mc, MD;  Location: MAIN OR [facility];  Service: Surgical Oncology Breast    PR REPLACEMENT TISSUE EXPANDER W/PERMANENT IMPLANT Bilateral 08/13/2021    Procedure: REPLACEMENT OF TISSUE EXPANDER WITH PERMANENT IMPLANT;  Surgeon: Arsenio Katz, MD;  Location: MAIN OR [facility];  Service: Plastics    PR REVISION OF RECONSTRUCTED BREAST Bilateral 04/16/2022    Procedure: REVISION OF RECONSTRUCTED BREAST;  Surgeon: Arsenio Katz, MD;  Location: MAIN OR ;  Service: Plastics    PR TISSUE EXPANDER PLACEMENT BREAST RECONSTRUCTION Bilateral 04/20/2020    Procedure: TISSUE EXPANDER PLACEMENT IN BREAST RECONSTRUCTION, INCLUDING SUBSEQUENT EXPANSION(S);  Surgeon: Arsenio Katz, MD;  Location: MAIN OR ;  Service: Plastics    TONSILECTOMY, ADENOIDECTOMY, BILATERAL MYRINGOTOMY AND TUBES      WISDOM TOOTH EXTRACTION  2004     Family History   Problem Relation Age of Onset Cancer Maternal Aunt 45        breast Ca    Hypertension Father     Other Father 64        Benign brain tumor    Hypothyroidism Mother     Cancer Paternal Aunt         Breast Ca dx in 23s    ALS Maternal Aunt     Ovarian cancer Maternal Grandmother 36    No Known Problems Maternal Grandfather     Cancer Paternal Grandmother         Unknown Cancer    No Known Problems Paternal Grandfather     No Known Problems Maternal Aunt     No Known Problems Maternal Aunt     Breast cancer Maternal Cousin         Dx in 21s    No Known Problems Paternal Aunt     No Known Problems Daughter     No Known Problems Daughter     No Known Problems Other     BRCA 1/2 Neg Hx     Colon cancer Neg Hx     Endometrial cancer Neg Hx      Social History     Socioeconomic History    Marital status: Married     Spouse name: Orthoptist    Number of children: 0   Occupational History    Occupation: Recreational therapist    Tobacco Use    Smoking status: Never     Passive exposure: Never    Smokeless tobacco: Never   Vaping Use    Vaping Use: Never used   Substance and Sexual Activity    Alcohol use: Not Currently    Drug use: No    Sexual activity: Yes     Partners: Male  Birth control/protection: Pill   Other Topics Concern    Exercise Yes    Living Situation No   Social History Narrative    PRECONCEPTUAL ASSESSMENT:        Nevia is a 32 y.o. Caucasian female    Caffeine:denies use    Cats: no    Exercise: not active    Diet: balanced    Family hx of of birth defects, chromosomal abnormality, intellectual disability, developmental delay: no.            PARTNER HISTORY:     Domingo Dimes is a 75 y.o.  Caucasian female.    Occupation:  Immunologist; [employer]    He has fathered children:  no    He is healthy with no chronic illness    Family history of  birth defects, chromosomal abnormality, intellectual disability, or developmental delay: no.              Social Determinants of Health     Financial Resource Strain: Low Risk  (04/21/2020)    Overall Financial Resource Strain (CARDIA)     Difficulty of Paying Living Expenses: Not very hard   Food Insecurity: No Food Insecurity (04/21/2020)    Hunger Vital Sign     Worried About Running Out of Food in the Last Year: Never true     Ran Out of Food in the Last Year: Never true   Transportation Needs: No Transportation Needs (04/21/2020)    PRAPARE - Therapist, art (Medical): No     Lack of Transportation (Non-Medical): No       Current Outpatient Medications:     abemaciclib (VERZENIO) 100 mg Tab tablet, Take 1 tablet (100 mg total) by mouth Two (2) times a day. Take with or without food., Disp: 56 tablet, Rfl: 5    acetaminophen (TYLENOL) 325 MG tablet, Take 2 tablets (650 mg total) by mouth every six (6) hours as needed for pain., Disp: , Rfl:     calcium carbonate (TUMS) 200 mg calcium (500 mg) chewable tablet, Chew 1 tablet (200 mg of elem calcium total) daily., Disp: , Rfl:     cetirizine (ZYRTEC) 10 MG tablet, Take 1 tablet (10 mg total) by mouth daily., Disp: 30 tablet, Rfl: 2    cholecalciferol, vitamin D3-25 mcg, 1,000 unit,, 25 mcg (1,000 unit) capsule, Take 1 capsule (25 mcg total) by mouth., Disp: , Rfl:     cyanocobalamin, vitamin B-12, 1000 MCG tablet, Take 1 tablet (1,000 mcg total) by mouth daily., Disp: , Rfl:     docusate sodium (COLACE) 100 MG capsule, Take 1 capsule (100 mg total) by mouth nightly as needed (PRN)., Disp: , Rfl:     escitalopram oxalate (LEXAPRO) 20 MG tablet, Take 1 tablet (20 mg total) by mouth daily., Disp: 90 tablet, Rfl: 3    letrozole (FEMARA) 2.5 mg tablet, Take 1 tablet (2.5 mg total) by mouth daily., Disp: 90 tablet, Rfl: 3    leuprolide (LUPRON) 3.75 mg injection, Inject 3.75 mg into the muscle., Disp: , Rfl:     omeprazole (PRILOSEC) 20 MG capsule, Take 1 capsule by mouth once daily, Disp: 90 capsule, Rfl: 0    ondansetron (ZOFRAN-ODT) 4 MG disintegrating tablet, Take 1 tablet in mouth every 8 hours as needed for nausea, Disp: 18 tablet, Rfl: 0    rimegepant (NURTEC ODT) 75 mg TbDL, Take 1 tablet (75 mg total) by mouth daily as needed., Disp: , Rfl:  topiramate (TOPAMAX) 25 MG tablet, TAKE 3 TABLETS BY MOUTH ONCE DAILY, Disp: , Rfl:     traZODone (DESYREL) 50 MG tablet, TAKE 1/2 TO 1 (ONE-HALF TO ONE) TABLET BY MOUTH NIGHTLY AS NEEDED FOR SLEEP, Disp: 30 tablet, Rfl: 0    vitamin E-134 mg, 200 UNIT, 134 mg (200 UNIT) capsule, Take 1 capsule (134 mg total) by mouth daily., Disp: , Rfl:     WEGOVY 1.7 MG/0.75 ML SUBCUTANEOUS PEN INJECTOR, INJECT 0.75ML (1.7MG  TOTAL) ML SUBCUTANEOUSLY  ONCE A WEEK, Disp: 4 mL, Rfl: 0    SUMAtriptan (IMITREX) 100 MG tablet, TAKE ONE TABLET BY MOUTH AT ONSET OF MIGRAINE FOR UP TO 1 DOSE . IF SYMPTOMS PERSIST A SECOND DOSE MAY BE TAKEN IN 2 HOURS IF NEEDED (Patient not taking: Reported on 06/27/2022), Disp: , Rfl:   No current facility-administered medications for this visit.    Facility-Administered Medications Ordered in Other Visits:     leuprolide (LUPRON) 3.75 mg injection, , , ,   No Known Allergies      Physical Examination:   Blood pressure 95/73, pulse 93, temperature 36.2 ??C (97.1 ??F), temperature source Temporal, resp. rate 16, weight 88.8 kg (195 lb 11.2 oz), last menstrual period 12/15/2019, SpO2 99 %, not currently breastfeeding.  General Appearance:  No acute distress, well appearing and well nourished.   Head:  Normocephalic, atraumatic.   Eyes:  Conjuctiva and lids appear normal. Pupils equal and round,   sclera anicteric.   Ears:  Overall appearance normal with no scars, lesions or               masses.  Hearing is grossly normal.   Neck: Supple, symmetrical, trachea midline, no adenopathy;   thyroid: no enlargement/tenderness/nodules.   Pulmonary:    Normal respiratory effort.    Musculoskeletal: Normal gait. Extremities without clubbing, cyanosis, or           edema.   Skin: Skin color, texture, turgor normal, no rashes or lesions.   Neurologic: No motor abnormalities noted. Sensation grossly intact. Lymphatic:  Breast: No cervical or supraclavicular LAD noted.   A comprehensive examination of the breasts and chest wall was performed with the patient upright and supine with arms at her sides and above her head.   Right breast is surgically absent with well healed mastectomy scar. The native nipple is surgically absent. The breast has been reconstructed with implant based reconstruction.  No palpable nodules, skin lesions, erythema, or areas of concern. infraclavicular and supraclavicular nodal examinations are negative. Arm circumference is increased consistent with lymphedema. Cosmetic outcome is excellent.  Left breast is surgically absent with well healed mastectomy scar. The native nipple is surgically absent. The breast has been reconstructed with implant based reconstruction.  No palpable nodules, skin lesions, erythema, or areas of concern. Exam unchanged from prior.    Psychiatric: Judgement and insight appropriate. Oriented to person,         place, and time.       Imaging Review:  None indicated due to mastectomies.     I have personally reviewed the breast imaging, laboratory values, existing medical records, and pathology. I have summarized these findings and my interpretation in the oncology history and assessment above.    Impression:  Ms. Merrihew is a 32 y.o. female with a history of right breast infiltrating ductal carcinoma, estrogen receptor positive, progesterone receptor positive and HER2 negative. See treatment summary above.   Staging   Cancer Staging   Malignant neoplasm  of right breast in female, estrogen receptor positive (CMS-HCC)  Staging form: Breast, AJCC 8th Edition  - Clinical: Stage IB (cT1c, cN1, cM0, G1, ER+, PR+, HER2-) - Signed by [name], MD on 12/02/2019  - Pathologic stage from 04/20/2020: No Stage Recommended (ypT1c, pN1a, cM0, G2, ER+, PR+, HER2-) - Signed by [name], MD on 04/25/2020    There is no evidence of disease at 2 years.    Recommendations:  Continue current therapy as prescribed by medical oncology.   Continue compression and pump for management of lymphedema.  Continue Vit E for hot flashes. Denies the need for additional intervention  Follow-up in one year for exam. Sooner for any new breast complaints.   Encouraged ongoing efforts at weight loss. We discussed that maintaining a healthy bodyweight has been shown to prevent cancer recurrence and I encouraged her to continue to work toward these goals.    All of the patient's questions and concerns were addressed during the visit and she is in agreement with the plan of care. Please do not hesitate to contact me with questions or concerns.     Cancer Staging   Malignant neoplasm of right breast in female, estrogen receptor positive (CMS-HCC)  Staging form: Breast, AJCC 8th Edition  - Clinical: Stage IB (cT1c, cN1, cM0, G1, ER+, PR+, HER2-) - Signed by [name], MD on 12/02/2019  - Pathologic stage from 04/20/2020: No Stage Recommended (ypT1c, pN1a, cM0, G2, ER+, PR+, HER2-) - Signed by [name], MD on 04/25/2020      Pollyann Samples, NP-C  Surgical Breast Oncology  07/02/2022  10:35 AM

## 2022-07-08 NOTE — Unmapped (Signed)
[facility] THERAPY SERVICES [place]  OUTPATIENT PHYSICAL THERAPY  07/08/2022  Note Type: Treatment Note       Patient Name: Tami Gibson  Date of Birth:07-18-1990  Diagnosis:   Encounter Diagnoses   Name Primary?   ??? Lymphedema syndrome, postmastectomy Yes   ??? Malignant neoplasm of right breast in female, estrogen receptor positive, unspecified site of breast (CMS-HCC)    ??? Scar condition and fibrosis of skin    ??? Lymphedema      Referring MD:  [name], MD    Date of Onset of Impairment-No date available  Date PT Care Plan Established or Reviewed-No date available  Date PT Treatment Started-No date available   Plan of Care Effective Date:     Hematology/Oncology History Overview Note   32 year old female with recently self palpated right breast lump. A targeted right breast ultrasound on 11/15/2019 showed an irregular mass with spiculated margins at the 7:00 position, 6 cm from the nipple, measuring 1.4 x 1.2 x 1.0 cm. There were 2 abnormal level 1 axillary lymph nodes measuring 1.4 x 1.3 cm and 1.2 x 0.9 cm. Additionally, there was a high level 1/level 2 axillary lymph node, measuring 2.1 x 1.3 cm with an effaced fatty hilum.      Malignant neoplasm of right breast in female, estrogen receptor positive (CMS-HCC)   11/09/2019 Initial Diagnosis    Malignant neoplasm of right breast in female, estrogen receptor positive (CMS-HCC)     11/15/2019 Biopsy    Invasive ductal carcinoma  - Nottingham combined histologic grade: 1  ER 91-100%, PR 91-100%, HER2 negative by IHC    Lymph node, right axilla, core biopsy  - Lymph node positive for metastatic carcinoma,      12/02/2019 -  Cancer Staged    Staging form: Breast, AJCC 8th Edition  - Clinical: Stage IB (cT1c, cN1, cM0, G1, ER+, PR+, HER2-) - Signed by [name], MD on 12/02/2019       12/09/2019 - 03/22/2020 Chemotherapy    OP BREAST AC (Dose Dense) q2W X 4 CYCLES, THEN PACLITAXEL (DOSE DENSE) Q2W x 4 CYCLES  DOXOrubicin 60 mg/m2 IV on day 1, cyclophosphamide 600 mg/m2 IV on day 1, every 2 weeks for 4 cycles, then PACLitaxel 175 mg/m2 every 2 weeks for 4 cycles      04/20/2020 Surgery    Breast, right, simple mastectomy  -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 Extracapsular extension is identified      Breast, left, simple mastectomy  -Benign breast tissue with no atypia, in situ or invasive carcinoma identified     04/20/2020 -  Cancer Staged    Staging form: Breast, AJCC 8th Edition  - Pathologic stage from 04/20/2020: No Stage Recommended (ypT1c, pN1a, cM0, G2, ER+, PR+, HER2-) - Signed by [name], MD on 04/25/2020       05/07/2020 Surgery    Lymph nodes, right axillary, dissection  -One of sixteen lymph nodes involved by metastatic adenocarcinoma (1/16)  -Metastatic focus measures 3 mm in greatest dimension  -No extracapsular extension is identified     06/28/2020 - 08/06/2020 Radiation    The total radiation dose will be 5000 cGy at 200 cGy/fraction for a total of 25 fractions, treated once a day     08/13/2020 Endocrine/Hormone Therapy    Letrozole 2.5mg  +Ovarian suppression (  monthly lupron)     Malignant neoplasm of right breast in female, estrogen receptor positive (CMS-HCC)   05/07/2020 Surgery    Lymph nodes, right axillary, dissection  -One of sixteen lymph nodes involved by metastatic adenocarcinoma (1/16)  -Metastatic focus measures 3 mm in greatest dimension  -No extracapsular extension is identified     06/13/2020 -  Radiation    Radiation Therapy Treatment Details (Noted on 06/13/2020)  Site: Right Breast - Overlapping sites  Technique: 3D CRT  Goal: No goal specified  Planned Treatment Start Date: No planned start date specified     06/21/2020 -  Radiation    Radiation Therapy Treatment Details (Noted on 06/21/2020)  Site: Right Breast - Overlapping sites  Technique: 3D CRT  Goal: No goal specified  Planned Treatment Start Date: No planned start date specified           Assessment/Plan:    Assessment  Assessment details:    Myofascial restriction and muscle tightness in R axilla, shoulder, pec; improved following treatment. Good form with new exercises.   32 y.o. female presents with Stage I lymphedema secondary to breast cancer treatment.  In addition to lymphedema , the patient is experiencing these side effects from cancer treatments: Myofascial/scar restrictions post op  Radiation: Fibrosis/myofascial restrictions  Currently the right limb is 7 % bigger than the left limb.   Patient requires skilled Physical Therapy services  for the following problem list and secondary functional limitations:    Problem List:   Lack of knowledge of lymphedema/edema self care  Uncontrolled swelling and/or lack of appropriate compression garment    Secondary Functional Limitations:  Decreased knowledge of self care  of lymphedema/edema puts patient at risk for increased swelling and infection  Uncontrolled swelling can increase chances of  infection, and limit functional ROM             Impairments: postural weakness, increased edema, impaired sensation, muscular restrictions, uncontrolled swelling, impaired ADLs, decreased skin integrity and decreased knowledge of self-care                Prognosis: excellent prognosis    Positive Prognosis Rationale: motivated for treatment, caregiver/family support, chronicity of condition, severity of symptoms, response to trial tx and previous tx with benefit.    Barriers to therapy: none identified    Therapy Goals      Goals:      Short Term Goals to be addressed in 4 weeks:  1. Pt will be independent with home stretching, ROM HEP to allow progression towards long-term goals, potentially increased comfort, ROM.   2. Pt will participate in MLD, be educated in self MLD to promote improved fluid transport, potentially decreased R breast and/or UE edema.  3. Pt will be able to articulate skin care recommendations, as well as signs/symptoms of infection, to promote long-term skin health, infection prevention, long-term maintenance of condition.    Long-Term Goals to be addressed in 8 weeks:  1. Pt will receive assistance with compression garment fitting and procurement, allowing potentially increased comfort, long-term maintenance of condition.   2. Patient to demonstrate reduction in R UE limb volume to no more than 12% larger than L UE.      Plan    Therapy options: will be seen for skilled physical therapy services    Planned therapy interventions: Body Mechanics Training, Compression Bandaging, Compression Pump, Diaphragmatic/Pursed-lip Breathing, Dry Needling, E-Stim, Education Therapist, art, Education - Patient, Endurance Activites, Functional Mobility, Arboriculturist, Home Exercise Program,  Manual Lymph Drainage, Manual Therapy, Neuromuscular Re-education, Postural Training, Self-Care/Home Training, Taping, TENS, Therapeutic Activities and Therapeutic Exercises      Frequency: 1x week    Duration in weeks: 8 weeks    Education provided to: patient.    Education provided: Equipment recommendations, HEP, Garment options, Manual lymph drainage and Treatment options and plan    Education results: needs reinforcement and needs further instruction.      Next visit plan:        Soft tissue mobilization  Postural exercises    Total Session Time: 48    Treatment rendered today:      ______________________________________________________________________    Manual Therapy x 40'  -soft tissue mobilization to R axilla including stretching, fascial mobilization, trigger point release, scar tissue mobilization  -kinesiotape applied to R breast scars    Therapeutic Exercise x 8'  -scap rows 20x with green band, verbal and tactile cuing  -squat paloff press 2x10, verbal and tactile cuing         ______________________________________________________________________          Subjective: History of Present Condition  Date of Surgery: 05/07/2020 (ALND)    History of Present Condition/Chief Complaint:       R UE lymphedema    Subjective:     Feeling well, still tightness in the pec but seems to be getting better.    Therapist wore a mask for the entire session.     I reviewed the no-show/attendance policy with the patient and caregiver(s). The patient and/or family is aware that they must call to cancel appointments more than 24 hours in advance. They are also aware that if they late cancel (less than 24 hours from appointment), arrives greater than 15 minutes late, or no-show three times, we reserve the right to cancel their remaining appointments. This policy is in place to allow [place] to best serve the needs of our caseload.      Quality of life: good    Pain  Current pain rating: 0  At best pain rating: 0  At worst pain rating: 4  Location: R arm above elbow      Precautions and Equipment  Precautions: Cancer history  Prior Functional Status:     Functional Limitation(s)-No physical limitations  Current functional status: limited recreation and limited exercise        Treatments  Previous treatment: physical therapy      Patient Goals  Patient goals for therapy: decreased edema  Patient goal: manage symptoms before surgery      Objective:   Lymphedema                       Inital values taken: 02/28/2022  9:01 AM   Involved arm: Right   Dominant arm: Right   Patient position: Seated   Total volume: right 3415.58 mL 06/24/2022  7:43 AM   Total volume: left 3189.63 mL 06/24/2022  7:43 AM   L/R difference +225.95 mL +7.08 %   R diff from initial -939.83 mL -21.58 %   R diff from last -939.83 mL -21.58 %          Inital values taken: 02/28/2022  9:01 AM   Involved arm: Right   Dominant arm: Right   Patient position: Seated   Total volume: right 3415.58 mL 06/24/2022  7:43 AM   Total volume: left 3189.63 mL 06/24/2022  7:43 AM   L/R difference +225.95 mL +7.08 %   R diff  from initial -939.83 mL -21.58 %   R diff from last -939.83 mL -21.58 %                                         I attest that I have reviewed the above information.  Signed: Edwena Bunde, PT  07/08/2022 9:14 AM

## 2022-07-14 DIAGNOSIS — C50911 Malignant neoplasm of unspecified site of right female breast: Principal | ICD-10-CM

## 2022-07-14 DIAGNOSIS — Z170 Estrogen receptor positive status [ER+]: Principal | ICD-10-CM

## 2022-07-14 DIAGNOSIS — Z6836 Body mass index (BMI) 36.0-36.9, adult: Principal | ICD-10-CM

## 2022-07-14 DIAGNOSIS — E661 Drug-induced obesity: Principal | ICD-10-CM

## 2022-07-14 MED ORDER — WEGOVY 1.7 MG/0.75 ML SUBCUTANEOUS PEN INJECTOR
0 refills | 0 days | Status: CP
Start: 2022-07-14 — End: ?

## 2022-07-14 NOTE — Unmapped (Signed)
Patient is requesting the following refill  Requested Prescriptions     Pending Prescriptions Disp Refills    WEGOVY 1.7 MG/0.75 ML SUBCUTANEOUS PEN INJECTOR [Pharmacy Med Name: Wegovy 1.7 MG/0.75ML Subcutaneous Solution Auto-injector] 4 mL 0     Sig: INJECT 0.75ML (1.7MG  TOTAL) SUBCUTANEOUSLY  ONCE A WEEK       Order pended. Please advise. Thanks    Last OV: 06/06/2022   Next OV: Visit date not found

## 2022-07-14 NOTE — Unmapped (Signed)
[facility] called and stated she found her package in the attic. She will not need a refill. She just opened the package yesterday

## 2022-07-17 NOTE — Unmapped (Signed)
[facility] THERAPY SERVICES [place]  OUTPATIENT PHYSICAL THERAPY  07/17/2022  Note Type: Treatment Note       Patient Name: Tami Gibson  Date of Birth:[birth year]  Diagnosis:   Encounter Diagnoses   Name Primary?    Lymphedema syndrome, postmastectomy Yes    Malignant neoplasm of right breast in female, estrogen receptor positive, unspecified site of breast (CMS-HCC)     Scar condition and fibrosis of skin     Lymphedema      Referring MD:  [name], MD    Date of Onset of Impairment-No date available  Date PT Care Plan Established or Reviewed-No date available  Date PT Treatment Started-No date available   Plan of Care Effective Date:     Hematology/Oncology History Overview Note   32 year old female with recently self palpated right breast lump. A targeted right breast ultrasound on 11/15/2019 showed an irregular mass with spiculated margins at the 7:00 position, 6 cm from the nipple, measuring 1.4 x 1.2 x 1.0 cm. There were 2 abnormal level 1 axillary lymph nodes measuring 1.4 x 1.3 cm and 1.2 x 0.9 cm. Additionally, there was a high level 1/level 2 axillary lymph node, measuring 2.1 x 1.3 cm with an effaced fatty hilum.      Malignant neoplasm of right breast in female, estrogen receptor positive (CMS-HCC)   11/09/2019 Initial Diagnosis    Malignant neoplasm of right breast in female, estrogen receptor positive (CMS-HCC)     11/15/2019 Biopsy    Invasive ductal carcinoma  - Nottingham combined histologic grade: 1  ER 91-100%, PR 91-100%, HER2 negative by IHC    Lymph node, right axilla, core biopsy  - Lymph node positive for metastatic carcinoma,      12/02/2019 -  Cancer Staged    Staging form: Breast, AJCC 8th Edition  - Clinical: Stage IB (cT1c, cN1, cM0, G1, ER+, PR+, HER2-) - Signed by [name], MD on 12/02/2019       12/09/2019 - 03/22/2020 Chemotherapy    OP BREAST AC (Dose Dense) q2W X 4 CYCLES, THEN PACLITAXEL (DOSE DENSE) Q2W x 4 CYCLES  DOXOrubicin 60 mg/m2 IV on day 1, cyclophosphamide 600 mg/m2 IV on day 1, every 2 weeks for 4 cycles, then PACLitaxel 175 mg/m2 every 2 weeks for 4 cycles      04/20/2020 Surgery    Breast, right, simple mastectomy  -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 Extracapsular extension is identified      Breast, left, simple mastectomy  -Benign breast tissue with no atypia, in situ or invasive carcinoma identified     04/20/2020 -  Cancer Staged    Staging form: Breast, AJCC 8th Edition  - Pathologic stage from 04/20/2020: No Stage Recommended (ypT1c, pN1a, cM0, G2, ER+, PR+, HER2-) - Signed by [name], MD on 04/25/2020       05/07/2020 Surgery    Lymph nodes, right axillary, dissection  -One of sixteen lymph nodes involved by metastatic adenocarcinoma (1/16)  -Metastatic focus measures 3 mm in greatest dimension  -No extracapsular extension is identified     06/28/2020 - 08/06/2020 Radiation    The total radiation dose will be 5000 cGy at 200 cGy/fraction for a total of 25 fractions, treated once a day     08/13/2020 Endocrine/Hormone Therapy    Letrozole 2.5mg  +Ovarian suppression (  monthly lupron)     Malignant neoplasm of right breast in female, estrogen receptor positive (CMS-HCC)   05/07/2020 Surgery    Lymph nodes, right axillary, dissection  -One of sixteen lymph nodes involved by metastatic adenocarcinoma (1/16)  -Metastatic focus measures 3 mm in greatest dimension  -No extracapsular extension is identified     06/13/2020 -  Radiation    Radiation Therapy Treatment Details (Noted on 06/13/2020)  Site: Right Breast - Overlapping sites  Technique: 3D CRT  Goal: No goal specified  Planned Treatment Start Date: No planned start date specified     06/21/2020 -  Radiation    Radiation Therapy Treatment Details (Noted on 06/21/2020)  Site: Right Breast - Overlapping sites  Technique: 3D CRT  Goal: No goal specified  Planned Treatment Start Date: No planned start date specified           Assessment/Plan:    Assessment  Assessment details:    Myofascial restriction and muscle tightness in R axilla, shoulder, pec; improved following treatment. Good form with exercises, improved periscapular ms recruitment.  32 y.o. female presents with Stage I lymphedema secondary to breast cancer treatment.  In addition to lymphedema , the patient is experiencing these side effects from cancer treatments: Myofascial/scar restrictions post op  Radiation: Fibrosis/myofascial restrictions  Currently the right limb is 7 % bigger than the left limb.   Patient requires skilled Physical Therapy services  for the following problem list and secondary functional limitations:    Problem List:   Lack of knowledge of lymphedema/edema self care  Uncontrolled swelling and/or lack of appropriate compression garment    Secondary Functional Limitations:  Decreased knowledge of self care  of lymphedema/edema puts patient at risk for increased swelling and infection  Uncontrolled swelling can increase chances of  infection, and limit functional ROM             Impairments: postural weakness, increased edema, impaired sensation, muscular restrictions, uncontrolled swelling, impaired ADLs, decreased skin integrity and decreased knowledge of self-care                Prognosis: excellent prognosis    Positive Prognosis Rationale: motivated for treatment, caregiver/family support, chronicity of condition, severity of symptoms, response to trial tx and previous tx with benefit.    Barriers to therapy: none identified    Therapy Goals      Goals:      Short Term Goals to be addressed in 4 weeks:  1. Pt will be independent with home stretching, ROM HEP to allow progression towards long-term goals, potentially increased comfort, ROM.   2. Pt will participate in MLD, be educated in self MLD to promote improved fluid transport, potentially decreased R breast and/or UE edema.  3. Pt will be able to articulate skin care recommendations, as well as signs/symptoms of infection, to promote long-term skin health, infection prevention, long-term maintenance of condition.    Long-Term Goals to be addressed in 8 weeks:  1. Pt will receive assistance with compression garment fitting and procurement, allowing potentially increased comfort, long-term maintenance of condition.   2. Patient to demonstrate reduction in R UE limb volume to no more than 12% larger than L UE.      Plan    Therapy options: will be seen for skilled physical therapy services    Planned therapy interventions: Body Mechanics Training, Compression Bandaging, Compression Pump, Diaphragmatic/Pursed-lip Breathing, Dry Needling, E-Stim, Education Therapist, art, Education - Patient, Endurance Activites, Functional Mobility, Arboriculturist, Home  Exercise Program, Manual Lymph Drainage, Manual Therapy, Neuromuscular Re-education, Postural Training, Self-Care/Home Training, Taping, TENS, Therapeutic Activities and Therapeutic Exercises      Frequency: 1x week    Duration in weeks: 8 weeks    Education provided to: patient.    Education provided: Equipment recommendations, HEP, Garment options, Manual lymph drainage and Treatment options and plan    Education results: needs reinforcement and needs further instruction.      Next visit plan:        Soft tissue mobilization  Postural exercises    Total Session Time: 48    Treatment rendered today:      ______________________________________________________________________    Manual Therapy x 40'  -soft tissue mobilization to R axilla including stretching, fascial mobilization, trigger point release, scar tissue mobilization  -kinesiotape applied to R breast scars    Therapeutic Exercise x 8'  -scap rows 20x with green band, verbal and tactile cuing  -squat paloff press 2x10, verbal and tactile cuing ______________________________________________________________________            Subjective:   History of Present Condition  Date of Surgery: 05/07/2020 (ALND)    History of Present Condition/Chief Complaint:       R UE lymphedema    Subjective:     Exercises seem to be getting a bit easier, she got her foam roller in but hasn't had a chance to try it yet.     Therapist wore a mask for the entire session.     I reviewed the no-show/attendance policy with the patient and caregiver(s). The patient and/or family is aware that they must call to cancel appointments more than 24 hours in advance. They are also aware that if they late cancel (less than 24 hours from appointment), arrives greater than 15 minutes late, or no-show three times, we reserve the right to cancel their remaining appointments. This policy is in place to allow [place] to best serve the needs of our caseload.      Quality of life: good    Pain  Current pain rating: 0  At best pain rating: 0  At worst pain rating: 4  Location: R arm above elbow      Precautions and Equipment  Precautions: Cancer history  Prior Functional Status:     Functional Limitation(s)-No physical limitations  Current functional status: limited recreation and limited exercise        Treatments  Previous treatment: physical therapy      Patient Goals  Patient goals for therapy: decreased edema  Patient goal: manage symptoms before surgery      Objective:   Lymphedema                       Inital values taken: 02/28/2022  9:01 AM   Involved arm: Right   Dominant arm: Right   Patient position: Seated   Total volume: right 3415.58 mL 06/24/2022  7:43 AM   Total volume: left 3189.63 mL 06/24/2022  7:43 AM   L/R difference +225.95 mL +7.08%   R diff from initial -939.83 mL -21.58%   R diff from last -939.83 mL -21.58%          Inital values taken: 02/28/2022  9:01 AM   Involved arm: Right   Dominant arm: Right   Patient position: Seated   Total volume: right 3415.58 mL 06/24/2022  7:43 AM Total volume: left 3189.63 mL 06/24/2022  7:43 AM   L/R difference +225.95 mL +7.08%  R diff from initial -939.83 mL -21.58%   R diff from last -939.83 mL -21.58%                                         I attest that I have reviewed the above information.  Signed: Edwena Bunde, PT  07/17/2022 7:39 AM

## 2022-07-18 ENCOUNTER — Ambulatory Visit: Admit: 2022-07-18 | Payer: PRIVATE HEALTH INSURANCE

## 2022-07-22 DIAGNOSIS — F419 Anxiety disorder, unspecified: Principal | ICD-10-CM

## 2022-07-22 MED ORDER — ESCITALOPRAM 20 MG TABLET
ORAL_TABLET | Freq: Every day | ORAL | 3 refills | 90 days | Status: CP
Start: 2022-07-22 — End: ?

## 2022-07-22 MED ORDER — OMEPRAZOLE 20 MG CAPSULE,DELAYED RELEASE
ORAL_CAPSULE | Freq: Every day | ORAL | 0 refills | 90 days
Start: 2022-07-22 — End: ?

## 2022-07-22 NOTE — Unmapped (Signed)
Please refill if appropriate.    Most recent clinic visit: 06/25/2022    Next clinic visit: 08/15/2022

## 2022-07-22 NOTE — Unmapped (Signed)
Patient is requesting the following refill  Requested Prescriptions     Pending Prescriptions Disp Refills    escitalopram oxalate (LEXAPRO) 20 MG tablet [Pharmacy Med Name: Escitalopram Oxalate 20 MG Oral Tablet] 90 tablet 0     Sig: Take 1 tablet by mouth once daily       Order pended. Please advise. Thanks    Last OV: 06/06/2022   Next OV: Visit date not found

## 2022-07-22 NOTE — Unmapped (Signed)
Tami Gibson patient

## 2022-07-28 ENCOUNTER — Institutional Professional Consult (permissible substitution): Admit: 2022-07-28 | Discharge: 2022-07-28 | Payer: PRIVATE HEALTH INSURANCE

## 2022-07-28 DIAGNOSIS — Z170 Estrogen receptor positive status [ER+]: Principal | ICD-10-CM

## 2022-07-28 DIAGNOSIS — C50911 Malignant neoplasm of unspecified site of right female breast: Principal | ICD-10-CM

## 2022-07-28 MED ADMIN — leuprolide (LUPRON) injection 3.75 mg: 3.75 mg | INTRAMUSCULAR | @ 15:00:00 | Stop: 2022-07-28

## 2022-07-28 MED ADMIN — leuprolide (LUPRON) 3.75 mg injection: INTRAMUSCULAR | @ 15:00:00 | Stop: 2022-07-28

## 2022-07-28 NOTE — Unmapped (Unsigned)
Medication, dosage and parameters reviewed by k. Dimitriov, Charity fundraiser. Verbal OK to release medication and administer.   Patient received Lupron 3.75 mg in RDUOQ at patient request.. Tolerated well. BandAid Applied. Patient education provided.

## 2022-07-29 NOTE — Unmapped (Signed)
Received Wegovy denial (dated 2/12). Letter stated that they did not receive information about her weights. Please send my letter that has this information.    Started semaglutide on 08/19/21. Weight prior to initiation was 251 lbs on 08/22/21. Since then has lost 50+ lbs and last recorded weight 195 lbs on 06/27/22.

## 2022-07-30 NOTE — Unmapped (Signed)
Letter faxed to [facility] at [phone] and [facility] at [phone].

## 2022-08-03 MED ORDER — OMEPRAZOLE 20 MG CAPSULE,DELAYED RELEASE
ORAL_CAPSULE | Freq: Every day | ORAL | 0 refills | 0 days
Start: 2022-08-03 — End: ?

## 2022-08-04 MED ORDER — OMEPRAZOLE 20 MG CAPSULE,DELAYED RELEASE
ORAL_CAPSULE | Freq: Every day | ORAL | 0 refills | 90 days | Status: CP
Start: 2022-08-04 — End: ?

## 2022-08-04 NOTE — Unmapped (Signed)
Pt is requesting refill    Most recent clinic visit: 06/25/2022  Next clinic visit:  08/25/2022

## 2022-08-05 ENCOUNTER — Ambulatory Visit
Admit: 2022-08-05 | Payer: PRIVATE HEALTH INSURANCE | Attending: Rehabilitative and Restorative Service Providers" | Primary: Rehabilitative and Restorative Service Providers"

## 2022-08-05 NOTE — Unmapped (Signed)
Tami Gibson has been contacted in regards to their refill of Verzenio. At this time, they have declined refill due to patient having 56 doses remaining. Refill assessment call date has been updated per the patient's request.

## 2022-08-05 NOTE — Unmapped (Signed)
[facility] THERAPY SERVICES [place]  OUTPATIENT PHYSICAL THERAPY  08/05/2022          Patient Name: Tami Gibson  Date of Birth:13-Apr-1991  Diagnosis:   Encounter Diagnoses   Name Primary?    Lymphedema syndrome, postmastectomy Yes    Malignant neoplasm of right breast in female, estrogen receptor positive, unspecified site of breast (CMS-HCC)     Scar condition and fibrosis of skin     Lymphedema      Referring MD:  [name], MD    Date of Onset of Impairment-No date available  Date PT Care Plan Established or Reviewed-No date available  Date PT Treatment Started-No date available   Plan of Care Effective Date:     Hematology/Oncology History Overview Note   32 year old female with recently self palpated right breast lump. A targeted right breast ultrasound on 11/15/2019 showed an irregular mass with spiculated margins at the 7:00 position, 6 cm from the nipple, measuring 1.4 x 1.2 x 1.0 cm. There were 2 abnormal level 1 axillary lymph nodes measuring 1.4 x 1.3 cm and 1.2 x 0.9 cm. Additionally, there was a high level 1/level 2 axillary lymph node, measuring 2.1 x 1.3 cm with an effaced fatty hilum.      Malignant neoplasm of right breast in female, estrogen receptor positive (CMS-HCC)   11/09/2019 Initial Diagnosis    Malignant neoplasm of right breast in female, estrogen receptor positive (CMS-HCC)     11/15/2019 Biopsy    Invasive ductal carcinoma  - Nottingham combined histologic grade: 1  ER 91-100%, PR 91-100%, HER2 negative by IHC    Lymph node, right axilla, core biopsy  - Lymph node positive for metastatic carcinoma,      12/02/2019 -  Cancer Staged    Staging form: Breast, AJCC 8th Edition  - Clinical: Stage IB (cT1c, cN1, cM0, G1, ER+, PR+, HER2-) - Signed by [name], MD on 12/02/2019       12/09/2019 - 03/22/2020 Chemotherapy    OP BREAST AC (Dose Dense) q2W X 4 CYCLES, THEN PACLITAXEL (DOSE DENSE) Q2W x 4 CYCLES  DOXOrubicin 60 mg/m2 IV on day 1, cyclophosphamide 600 mg/m2 IV on day 1, every 2 weeks for 4 cycles, then PACLitaxel 175 mg/m2 every 2 weeks for 4 cycles      04/20/2020 Surgery    Breast, right, simple mastectomy  -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 Extracapsular extension is identified      Breast, left, simple mastectomy  -Benign breast tissue with no atypia, in situ or invasive carcinoma identified     04/20/2020 -  Cancer Staged    Staging form: Breast, AJCC 8th Edition  - Pathologic stage from 04/20/2020: No Stage Recommended (ypT1c, pN1a, cM0, G2, ER+, PR+, HER2-) - Signed by [name], MD on 04/25/2020       05/07/2020 Surgery    Lymph nodes, right axillary, dissection  -One of sixteen lymph nodes involved by metastatic adenocarcinoma (1/16)  -Metastatic focus measures 3 mm in greatest dimension  -No extracapsular extension is identified     06/28/2020 - 08/06/2020 Radiation    The total radiation dose will be 5000 cGy at 200 cGy/fraction for a total of 25 fractions, treated once a day     08/13/2020 Endocrine/Hormone Therapy    Letrozole 2.5mg  +Ovarian suppression (monthly lupron)  Malignant neoplasm of right breast in female, estrogen receptor positive (CMS-HCC)   05/07/2020 Surgery    Lymph nodes, right axillary, dissection  -One of sixteen lymph nodes involved by metastatic adenocarcinoma (1/16)  -Metastatic focus measures 3 mm in greatest dimension  -No extracapsular extension is identified     06/13/2020 -  Radiation    Radiation Therapy Treatment Details (Noted on 06/13/2020)  Site: Right Breast - Overlapping sites  Technique: 3D CRT  Goal: No goal specified  Planned Treatment Start Date: No planned start date specified     06/21/2020 -  Radiation    Radiation Therapy Treatment Details (Noted on 06/21/2020)  Site: Right Breast - Overlapping sites  Technique: 3D CRT  Goal: No goal specified  Planned Treatment Start Date: No planned start date specified           Assessment/Plan:    Assessment  Assessment details:    Myofascial restriction and muscle tightness in R axilla, shoulder, pec; improved following treatment. Good form with exercises, improved periscapular ms recruitment.  32 y.o. female presents with Stage I lymphedema secondary to breast cancer treatment.  In addition to lymphedema , the patient is experiencing these side effects from cancer treatments: Myofascial/scar restrictions post op  Radiation: Fibrosis/myofascial restrictions  Currently the right limb is 7 % bigger than the left limb.   Patient requires skilled Physical Therapy services  for the following problem list and secondary functional limitations:    Problem List:   Lack of knowledge of lymphedema/edema self care  Uncontrolled swelling and/or lack of appropriate compression garment    Secondary Functional Limitations:  Decreased knowledge of self care  of lymphedema/edema puts patient at risk for increased swelling and infection  Uncontrolled swelling can increase chances of  infection, and limit functional ROM             Impairments: postural weakness, increased edema, impaired sensation, muscular restrictions, uncontrolled swelling, impaired ADLs, decreased skin integrity and decreased knowledge of self-care                Prognosis: excellent prognosis    Positive Prognosis Rationale: motivated for treatment, caregiver/family support, chronicity of condition, severity of symptoms, response to trial tx and previous tx with benefit.    Barriers to therapy: none identified    Therapy Goals      Goals:      Short Term Goals to be addressed in 4 weeks:  1. Pt will be independent with home stretching, ROM HEP to allow progression towards long-term goals, potentially increased comfort, ROM.   2. Pt will participate in MLD, be educated in self MLD to promote improved fluid transport, potentially decreased R breast and/or UE edema.  3. Pt will be able to articulate skin care recommendations, as well as signs/symptoms of infection, to promote long-term skin health, infection prevention, long-term maintenance of condition.    Long-Term Goals to be addressed in 8 weeks:  1. Pt will receive assistance with compression garment fitting and procurement, allowing potentially increased comfort, long-term maintenance of condition.   2. Patient to demonstrate reduction in R UE limb volume to no more than 12% larger than L UE.      Plan    Therapy options: will be seen for skilled physical therapy services    Planned therapy interventions: Body Mechanics Training, Compression Bandaging, Compression Pump, Diaphragmatic/Pursed-lip Breathing, Dry Needling, E-Stim, Education Therapist, art, Education - Patient, Endurance Activites, Functional Mobility, Garment Measurement, Home Exercise Program, Manual Lymph Drainage, Manual  Therapy, Neuromuscular Re-education, Postural Training, Self-Care/Home Training, Taping, TENS, Therapeutic Activities and Therapeutic Exercises      Frequency: 1x week    Duration in weeks: 8 weeks    Education provided to: patient.    Education provided: Equipment recommendations, HEP, Garment options, Manual lymph drainage and Treatment options and plan    Education results: needs reinforcement and needs further instruction.      Next visit plan:        Soft tissue mobilization  Postural exercises    Total Session Time: 55    Treatment rendered today:      ______________________________________________________________________    Manual Therapy x 55'  -soft tissue mobilization to R axilla including stretching, fascial mobilization, trigger point release, scar tissue mobilization  -manual lymphatic drainage to address R UE    ______________________________________________________________________            Subjective:   History of Present Condition  Date of Surgery: 05/07/2020 (ALND)    History of Present Condition/Chief Complaint:       R UE lymphedema    Subjective:     Went bowling again and it felt good. Has tried the foam roller a couple of times and it feels good, just hard to find the time to do it. Still feels like her R implant sits high and so the muscles around it are tense, so she's spending a lot of time massaging that superior part of her R breast.     Therapist wore a mask for the entire session.     I reviewed the no-show/attendance policy with the patient and caregiver(s). The patient and/or family is aware that they must call to cancel appointments more than 24 hours in advance. They are also aware that if they late cancel (less than 24 hours from appointment), arrives greater than 15 minutes late, or no-show three times, we reserve the right to cancel their remaining appointments. This policy is in place to allow [place] to best serve the needs of our caseload.      Quality of life: good    Pain  Current pain rating: 0  At best pain rating: 0  At worst pain rating: 4  Location: R arm above elbow      Precautions and Equipment  Precautions: Cancer history  Prior Functional Status:     Functional Limitation(s)-No physical limitations  Current functional status: limited recreation and limited exercise        Treatments  Previous treatment: physical therapy      Patient Goals  Patient goals for therapy: decreased edema  Patient goal: manage symptoms before surgery      Objective:   Lymphedema                       Inital values taken: 02/28/2022  9:01 AM   Involved arm: Right   Dominant arm: Right   Patient position: Seated   Total volume: right 3415.58 mL 06/24/2022  7:43 AM   Total volume: left 3189.63 mL 06/24/2022  7:43 AM   L/R difference +225.95 mL +7.08%   R diff from initial -939.83 mL -21.58%   R diff from last -939.83 mL -21.58%          Inital values taken: 02/28/2022  9:01 AM   Involved arm: Right   Dominant arm: Right   Patient position: Seated   Total volume: right 3415.58 mL 06/24/2022  7:43 AM   Total volume: left 3189.63 mL 06/24/2022  7:43 AM   L/R difference +225.95 mL +7.08%   R diff from initial -939.83 mL -21.58%   R diff from last -939.83 mL -21.58%                                         I attest that I have reviewed the above information.  Signed: Edwena Bunde, PT  08/05/2022 7:41 AM

## 2022-08-06 DIAGNOSIS — E661 Drug-induced obesity: Principal | ICD-10-CM

## 2022-08-06 DIAGNOSIS — Z6836 Body mass index (BMI) 36.0-36.9, adult: Principal | ICD-10-CM

## 2022-08-06 MED ORDER — WEGOVY 1.7 MG/0.75 ML SUBCUTANEOUS PEN INJECTOR
0 refills | 0 days | Status: CP
Start: 2022-08-06 — End: ?

## 2022-08-06 NOTE — Unmapped (Signed)
Patient is requesting the following refill  Requested Prescriptions     Pending Prescriptions Disp Refills    WEGOVY 1.7 MG/0.75 ML SUBCUTANEOUS PEN INJECTOR [Pharmacy Med Name: Wegovy 1.7 MG/0.75ML Subcutaneous Solution Auto-injector] 4 mL 0     Sig: INJECT 1.7MG  TOTAL SUBCUTANEOUSLY ONCE WEEKLY       Order pended. Please advise. Thanks    Last OV: 06/06/2022   Next OV: Visit date not found

## 2022-08-19 NOTE — Unmapped (Signed)
[facility] THERAPY SERVICES [place]  OUTPATIENT PHYSICAL THERAPY  08/19/2022  Note Type: Treatment Note       Patient Name: Tami Gibson  Date of Birth:[birth year]  Diagnosis:   Encounter Diagnoses   Name Primary?    Lymphedema syndrome, postmastectomy Yes    Malignant neoplasm of right breast in female, estrogen receptor positive, unspecified site of breast (CMS-HCC)     Scar condition and fibrosis of skin     Lymphedema      Referring MD:  [name], MD    Date of Onset of Impairment-No date available  Date PT Care Plan Established or Reviewed-No date available  Date PT Treatment Started-No date available   Plan of Care Effective Date:     Hematology/Oncology History Overview Note   32 year old female with recently self palpated right breast lump. A targeted right breast ultrasound on 11/15/2019 showed an irregular mass with spiculated margins at the 7:00 position, 6 cm from the nipple, measuring 1.4 x 1.2 x 1.0 cm. There were 2 abnormal level 1 axillary lymph nodes measuring 1.4 x 1.3 cm and 1.2 x 0.9 cm. Additionally, there was a high level 1/level 2 axillary lymph node, measuring 2.1 x 1.3 cm with an effaced fatty hilum.      Malignant neoplasm of right breast in female, estrogen receptor positive (CMS-HCC)   11/09/2019 Initial Diagnosis    Malignant neoplasm of right breast in female, estrogen receptor positive (CMS-HCC)     11/15/2019 Biopsy    Invasive ductal carcinoma  - Nottingham combined histologic grade: 1  ER 91-100%, PR 91-100%, HER2 negative by IHC    Lymph node, right axilla, core biopsy  - Lymph node positive for metastatic carcinoma,      12/02/2019 -  Cancer Staged    Staging form: Breast, AJCC 8th Edition  - Clinical: Stage IB (cT1c, cN1, cM0, G1, ER+, PR+, HER2-) - Signed by [name], MD on 12/02/2019       12/09/2019 - 03/22/2020 Chemotherapy    OP BREAST AC (Dose Dense) q2W X 4 CYCLES, THEN PACLITAXEL (DOSE DENSE) Q2W x 4 CYCLES  DOXOrubicin 60 mg/m2 IV on day 1, cyclophosphamide 600 mg/m2 IV on day 1, every 2 weeks for 4 cycles, then PACLitaxel 175 mg/m2 every 2 weeks for 4 cycles      04/20/2020 Surgery    Breast, right, simple mastectomy  -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 Extracapsular extension is identified      Breast, left, simple mastectomy  -Benign breast tissue with no atypia, in situ or invasive carcinoma identified     04/20/2020 -  Cancer Staged    Staging form: Breast, AJCC 8th Edition  - Pathologic stage from 04/20/2020: No Stage Recommended (ypT1c, pN1a, cM0, G2, ER+, PR+, HER2-) - Signed by [name], MD on 04/25/2020       05/07/2020 Surgery    Lymph nodes, right axillary, dissection  -One of sixteen lymph nodes involved by metastatic adenocarcinoma (1/16)  -Metastatic focus measures 3 mm in greatest dimension  -No extracapsular extension is identified     06/28/2020 - 08/06/2020 Radiation    The total radiation dose will be 5000 cGy at 200 cGy/fraction for a total of 25 fractions, treated once a day     08/13/2020 Endocrine/Hormone Therapy    Letrozole 2.5mg  +Ovarian suppression (  monthly lupron)     Malignant neoplasm of right breast in female, estrogen receptor positive (CMS-HCC)   05/07/2020 Surgery    Lymph nodes, right axillary, dissection  -One of sixteen lymph nodes involved by metastatic adenocarcinoma (1/16)  -Metastatic focus measures 3 mm in greatest dimension  -No extracapsular extension is identified     06/13/2020 -  Radiation    Radiation Therapy Treatment Details (Noted on 06/13/2020)  Site: Right Breast - Overlapping sites  Technique: 3D CRT  Goal: No goal specified  Planned Treatment Start Date: No planned start date specified     06/21/2020 -  Radiation    Radiation Therapy Treatment Details (Noted on 06/21/2020)  Site: Right Breast - Overlapping sites  Technique: 3D CRT  Goal: No goal specified  Planned Treatment Start Date: No planned start date specified           Assessment/Plan:    Assessment  Assessment details:    Myofascial restriction and muscle tightness in R axilla, shoulder, pec; improved following treatment. Good form with exercises, improved periscapular ms recruitment.  32 y.o. female presents with Stage I lymphedema secondary to breast cancer treatment.  In addition to lymphedema , the patient is experiencing these side effects from cancer treatments: Myofascial/scar restrictions post op  Radiation: Fibrosis/myofascial restrictions  Currently the right limb is 7 % bigger than the left limb.   Patient requires skilled Physical Therapy services  for the following problem list and secondary functional limitations:    Problem List:   Lack of knowledge of lymphedema/edema self care  Uncontrolled swelling and/or lack of appropriate compression garment    Secondary Functional Limitations:  Decreased knowledge of self care  of lymphedema/edema puts patient at risk for increased swelling and infection  Uncontrolled swelling can increase chances of  infection, and limit functional ROM             Impairments: postural weakness, increased edema, impaired sensation, muscular restrictions, uncontrolled swelling, impaired ADLs, decreased skin integrity and decreased knowledge of self-care                Prognosis: excellent prognosis    Positive Prognosis Rationale: motivated for treatment, caregiver/family support, chronicity of condition, severity of symptoms, response to trial tx and previous tx with benefit.    Barriers to therapy: none identified    Therapy Goals      Goals:      Short Term Goals to be addressed in 4 weeks:  1. Pt will be independent with home stretching, ROM HEP to allow progression towards long-term goals, potentially increased comfort, ROM.   2. Pt will participate in MLD, be educated in self MLD to promote improved fluid transport, potentially decreased R breast and/or UE edema.  3. Pt will be able to articulate skin care recommendations, as well as signs/symptoms of infection, to promote long-term skin health, infection prevention, long-term maintenance of condition.    Long-Term Goals to be addressed in 8 weeks:  1. Pt will receive assistance with compression garment fitting and procurement, allowing potentially increased comfort, long-term maintenance of condition.   2. Patient to demonstrate reduction in R UE limb volume to no more than 12% larger than L UE.      Plan    Therapy options: will be seen for skilled physical therapy services    Planned therapy interventions: Body Mechanics Training, Compression Bandaging, Compression Pump, Diaphragmatic/Pursed-lip Breathing, Dry Needling, E-Stim, Education Therapist, art, Education - Patient, Endurance Activites, Functional Mobility, Arboriculturist, Home  Exercise Program, Manual Lymph Drainage, Manual Therapy, Neuromuscular Re-education, Postural Training, Self-Care/Home Training, Taping, TENS, Therapeutic Activities and Therapeutic Exercises      Frequency: 1x week    Duration in weeks: 8 weeks    Education provided to: patient.    Education provided: Equipment recommendations, HEP, Garment options, Manual lymph drainage and Treatment options and plan    Education results: needs reinforcement and needs further instruction.      Next visit plan:        Soft tissue mobilization  Postural exercises    Total Session Time: 50    Treatment rendered today:      ______________________________________________________________________    Manual Therapy x 50'  -cold laser-assisted MFR Settings: Acute, pulsed high  Duty Cycle: 43 seconds, 5.3 J/cm2  -soft tissue mobilization to R axilla including stretching, fascial mobilization, trigger point release, scar tissue mobilization  -manual lymphatic drainage to address R UE    ______________________________________________________________________            Subjective:   History of Present Condition  Date of Surgery: 05/07/2020 (ALND)    History of Present Condition/Chief Complaint:       R UE lymphedema    Subjective:     Patient's daughter started Tball last week so the whole family spent time throwing the ball and batting, afterward she felt swollen and sore. Posterior upper arm is tender. Husband has been helping with roller/massager on breast and in axilla.    Therapist wore a mask for the entire session.     I reviewed the no-show/attendance policy with the patient and caregiver(s). The patient and/or family is aware that they must call to cancel appointments more than 24 hours in advance. They are also aware that if they late cancel (less than 24 hours from appointment), arrives greater than 15 minutes late, or no-show three times, we reserve the right to cancel their remaining appointments. This policy is in place to allow [place] to best serve the needs of our caseload.      Quality of life: good    Pain  Current pain rating: 0  At best pain rating: 0  At worst pain rating: 4  Location: R arm above elbow      Precautions and Equipment  Precautions: Cancer history  Prior Functional Status:     Functional Limitation(s)-No physical limitations  Current functional status: limited recreation and limited exercise        Treatments  Previous treatment: physical therapy      Patient Goals  Patient goals for therapy: decreased edema  Patient goal: manage symptoms before surgery      Objective:   Lymphedema                       Inital values taken: 02/28/2022  9:01 AM   Involved arm: Right   Dominant arm: Right   Patient position: Seated   Total volume: right 3415.58 mL 06/24/2022  7:43 AM   Total volume: left 3189.63 mL 06/24/2022  7:43 AM   L/R difference +225.95 mL +7.08%   R diff from initial -939.83 mL -21.58%   R diff from last -939.83 mL -21.58%          Inital values taken: 02/28/2022  9:01 AM   Involved arm: Right   Dominant arm: Right   Patient position: Seated   Total volume: right 3415.58 mL 06/24/2022  7:43 AM   Total volume: left 3189.63 mL 06/24/2022  7:43  AM   L/R difference +225.95 mL +7.08%   R diff from initial -939.83 mL -21.58%   R diff from last -939.83 mL -21.58%                                         I attest that I have reviewed the above information.  Signed: Edwena Bunde, PT  08/19/2022 7:45 AM

## 2022-08-22 NOTE — Unmapped (Signed)
[facility] Clinical Assessment & Refill Coordination Note    Tami Gibson, [place]: 1991-03-30  Phone: [phone] (home)     All above HIPAA information was verified with patient.     Was a Nurse, learning disability used for this call? No    Specialty Medication(s):   Hematology/Oncology: Verzenio 100mg      Current Outpatient Medications   Medication Sig Dispense Refill    abemaciclib (VERZENIO) 100 mg Tab tablet Take 1 tablet (100 mg total) by mouth Two (2) times a day. Take with or without food. 56 tablet 5    acetaminophen (TYLENOL) 325 MG tablet Take 2 tablets (650 mg total) by mouth every six (6) hours as needed for pain.      calcium carbonate (TUMS) 200 mg calcium (500 mg) chewable tablet Chew 1 tablet (200 mg of elem calcium total) daily.      cetirizine (ZYRTEC) 10 MG tablet Take 1 tablet (10 mg total) by mouth daily. 30 tablet 2    cholecalciferol, vitamin D3-25 mcg, 1,000 unit,, 25 mcg (1,000 unit) capsule Take 1 capsule (25 mcg total) by mouth.      cyanocobalamin, vitamin B-12, 1000 MCG tablet Take 1 tablet (1,000 mcg total) by mouth daily.      docusate sodium (COLACE) 100 MG capsule Take 1 capsule (100 mg total) by mouth nightly as needed (PRN).      escitalopram oxalate (LEXAPRO) 20 MG tablet Take 1 tablet by mouth once daily 90 tablet 3    letrozole (FEMARA) 2.5 mg tablet Take 1 tablet (2.5 mg total) by mouth daily. 90 tablet 3    leuprolide (LUPRON) 3.75 mg injection Inject 3.75 mg into the muscle.      omeprazole (PRILOSEC) 20 MG capsule Take 1 capsule by mouth once daily 90 capsule 0    ondansetron (ZOFRAN-ODT) 4 MG disintegrating tablet Take 1 tablet in mouth every 8 hours as needed for nausea 18 tablet 0    rimegepant (NURTEC ODT) 75 mg TbDL Take 1 tablet (75 mg total) by mouth daily as needed.      SUMAtriptan (IMITREX) 100 MG tablet TAKE ONE TABLET BY MOUTH AT ONSET OF MIGRAINE FOR UP TO 1 DOSE . IF SYMPTOMS PERSIST A SECOND DOSE MAY BE TAKEN IN 2 HOURS IF NEEDED (Patient not taking: Reported on 06/27/2022)      topiramate (TOPAMAX) 25 MG tablet TAKE 3 TABLETS BY MOUTH ONCE DAILY      traZODone (DESYREL) 50 MG tablet TAKE 1/2 TO 1 (ONE-HALF TO ONE) TABLET BY MOUTH NIGHTLY AS NEEDED FOR SLEEP 30 tablet 0    vitamin E-134 mg, 200 UNIT, 134 mg (200 UNIT) capsule Take 1 capsule (134 mg total) by mouth daily.      WEGOVY 1.7 MG/0.75 ML SUBCUTANEOUS PEN INJECTOR INJECT 1.7MG  TOTAL SUBCUTANEOUSLY ONCE WEEKLY 4 mL 0     No current facility-administered medications for this visit.     Facility-Administered Medications Ordered in Other Visits   Medication Dose Route Frequency Provider Last Rate Last Admin    leuprolide (LUPRON) 3.75 mg injection                 Changes to medications: Tami Gibson reports no changes at this time.    No Known Allergies    Changes to allergies: No    SPECIALTY MEDICATION ADHERENCE     Verzenio 100 mg: 10 days of medicine on hand   Medication Adherence    Patient reported X missed doses in the  last month: 0  Specialty Medication: Verzenio 100mg           Specialty medication(s) dose(s) confirmed: Regimen is correct and unchanged.     Are there any concerns with adherence? No    Adherence counseling provided? Not needed    CLINICAL MANAGEMENT AND INTERVENTION      Clinical Benefit Assessment:    Do you feel the medicine is effective or helping your condition? Yes    Clinical Benefit counseling provided? Not needed    Adverse Effects Assessment:    Are you experiencing any side effects? No    Are you experiencing difficulty administering your medicine? No    Quality of Life Assessment:    Quality of Life      Oncology  1. What impact has your specialty medication had on the reduction of your daily pain or discomfort level?: None  2. On a scale of 1-10, how would you rate your ability to manage side effects associated with your specialty medication? (1=no issues, 10 = unable to take medication due to side effects): 1            How many days over the past month did your condition/medication  keep you from your normal activities? For example, brushing your teeth or getting up in the morning. 0    Have you discussed this with your provider? Not needed    Acute Infection Status:    Acute infections noted within Epic:  No active infections  Patient reported infection: None    Therapy Appropriateness:    Is therapy appropriate and patient progressing towards therapeutic goals? Yes, therapy is appropriate and should be continued    DISEASE/MEDICATION-SPECIFIC INFORMATION      N/A    Oncology: Is the patient receiving adequate infection prevention treatment? Not applicable  Does the patient have adequate nutritional support? Not applicable    PATIENT SPECIFIC NEEDS     Does the patient have any physical, cognitive, or cultural barriers? No    Is the patient high risk? Yes, patient is taking oral chemotherapy. Appropriateness of therapy as been assessed    Did the patient require a clinical intervention? No    Does the patient require physician intervention or other additional services (i.e., nutrition, smoking cessation, social work)? No    SOCIAL DETERMINANTS OF HEALTH     At the [facility], we have learned that life circumstances - like trouble affording food, housing, utilities, or transportation can affect the health of many of our patients.   That is why we wanted to ask: are you currently experiencing any life circumstances that are negatively impacting your health and/or quality of life? No    Social Determinants of Health     Financial Resource Strain: Low Risk  (04/21/2020)    Overall Financial Resource Strain (CARDIA)     Difficulty of Paying Living Expenses: Not very hard   Internet Connectivity: Not on file   Food Insecurity: No Food Insecurity (04/21/2020)    Hunger Vital Sign     Worried About Running Out of Food in the Last Year: Never true     Ran Out of Food in the Last Year: Never true   Tobacco Use: Low Risk  (07/02/2022)    Patient History     Smoking Tobacco Use: Never     Smokeless Tobacco Use: Never     Passive Exposure: Never   Housing/Utilities: Low Risk  (04/21/2020)    Housing/Utilities     Within  the past 12 months, have you ever stayed: outside, in a car, in a tent, in an overnight shelter, or temporarily in someone else's home (i.e. couch-surfing)?: No     Are you worried about losing your housing?: No     Within the past 12 months, have you been unable to get utilities (heat, electricity) when it was really needed?: No   Alcohol Use: Not At Risk (04/16/2020)    Alcohol Use     How often do you have a drink containing alcohol?: 2 - 4 times per month     How many drinks containing alcohol do you have on a typical day when you are drinking?: 1 - 2     How often do you have 5 or more drinks on one occasion?: Never   Transportation Needs: No Transportation Needs (04/21/2020)    PRAPARE - Therapist, art (Medical): No     Lack of Transportation (Non-Medical): No   Substance Use: Not on file   Health Literacy: Low Risk  (04/16/2020)    Health Literacy     : Never   Physical Activity: Not on file   Interpersonal Safety: Not on file   Stress: Not on file   Intimate Partner Violence: Not on file   Depression: Not at risk (02/21/2022)    PHQ-2     PHQ-2 Score: 0   Social Connections: Not on file       Would you be willing to receive help with any of the needs that you have identified today? Not applicable       SHIPPING     Specialty Medication(s) to be Shipped:   Hematology/Oncology: Verzenio 100mg     Other medication(s) to be shipped: No additional medications requested for fill at this time     Changes to insurance: No    Patient was informed of new phone menu: Yes    Delivery Scheduled: Yes, Expected medication delivery date: 08/27/22.     Medication will be delivered via Next Day Courier to the confirmed prescription address in [facility].    The patient will receive a drug information handout for each medication shipped and additional FDA Medication Guides as required.  Verified that patient has previously received a Conservation officer, historic buildings and a Surveyor, mining.    The patient or caregiver noted above participated in the development of this care plan and knows that they can request review of or adjustments to the care plan at any time.      All of the patient's questions and concerns have been addressed.    Rollen Sox, [facility]   [facility] Specialty Pharmacist

## 2022-08-25 ENCOUNTER — Ambulatory Visit: Admit: 2022-08-25 | Payer: PRIVATE HEALTH INSURANCE

## 2022-08-26 DIAGNOSIS — Z170 Estrogen receptor positive status [ER+]: Principal | ICD-10-CM

## 2022-08-26 DIAGNOSIS — C50911 Malignant neoplasm of unspecified site of right female breast: Principal | ICD-10-CM

## 2022-08-26 MED FILL — VERZENIO 100 MG TABLET: ORAL | 28 days supply | Qty: 56 | Fill #2

## 2022-09-02 DIAGNOSIS — Z6836 Body mass index (BMI) 36.0-36.9, adult: Principal | ICD-10-CM

## 2022-09-02 DIAGNOSIS — E661 Drug-induced obesity: Principal | ICD-10-CM

## 2022-09-02 MED ORDER — WEGOVY 2.4 MG/0.75 ML SUBCUTANEOUS PEN INJECTOR
SUBCUTANEOUS | 3 refills | 0 days | Status: CP
Start: 2022-09-02 — End: ?

## 2022-09-07 DIAGNOSIS — C50911 Malignant neoplasm of unspecified site of right female breast: Principal | ICD-10-CM

## 2022-09-07 DIAGNOSIS — Z170 Estrogen receptor positive status [ER+]: Principal | ICD-10-CM

## 2022-09-09 ENCOUNTER — Ambulatory Visit
Admit: 2022-09-09 | Payer: PRIVATE HEALTH INSURANCE | Attending: Rehabilitative and Restorative Service Providers" | Primary: Rehabilitative and Restorative Service Providers"

## 2022-09-09 ENCOUNTER — Ambulatory Visit
Admit: 2022-09-09 | Discharge: 2022-09-25 | Payer: PRIVATE HEALTH INSURANCE | Attending: Rehabilitative and Restorative Service Providers" | Primary: Rehabilitative and Restorative Service Providers"

## 2022-09-09 NOTE — Unmapped (Signed)
[facility] THERAP[facility] SERVICES [place]  OUTPATIENT PHYSICAL THERAPY  09/09/2022  Note Type: Treatment Note       Patient Name: Tami Gibson  Date of Birth:11-Aug-1990  Diagnosis:   Encounter Diagnoses   Name Primary?   ??? Lymphedema syndrome, postmastectomy Yes   ??? Malignant neoplasm of right breast in female, estrogen receptor positive, unspecified site of breast (CMS-HCC)    ??? Scar condition and fibrosis of skin    ??? Lymphedema      Referring MD:  [name], MD    Date of Onset of Impairment-No date available  Date PT Care Plan Established or Reviewed-No date available  Date PT Treatment Started-No date available   Plan of Care Effective Date:     Hematology/Oncology History Overview Note   32 year old female with recently self palpated right breast lump. A targeted right breast ultrasound on 11/15/2019 showed an irregular mass with spiculated margins at the 7:00 position, 6 cm from the nipple, measuring 1.4 x 1.2 x 1.0 cm. There were 2 abnormal level 1 axillary lymph nodes measuring 1.4 x 1.3 cm and 1.2 x 0.9 cm. Additionally, there was a high level 1/level 2 axillary lymph node, measuring 2.1 x 1.3 cm with an effaced fatty hilum.      Malignant neoplasm of right breast in female, estrogen receptor positive (CMS-HCC)   11/09/2019 Initial Diagnosis    Malignant neoplasm of right breast in female, estrogen receptor positive (CMS-HCC)     11/15/2019 Biopsy    Invasive ductal carcinoma  - Nottingham combined histologic grade: 1  ER 91-100%, PR 91-100%, HER2 negative by IHC    Lymph node, right axilla, core biopsy  - Lymph node positive for metastatic carcinoma,      12/02/2019 -  Cancer Staged    Staging form: Breast, AJCC 8th Edition  - Clinical: Stage IB (cT1c, cN1, cM0, G1, ER+, PR+, HER2-) - Signed by [name], MD on 12/02/2019       12/09/2019 - 03/22/2020 Chemotherapy    OP BREAST AC (Dose Dense) q2W X 4 CYCLES, THEN PACLITAXEL (DOSE DENSE) Q2W x 4 CYCLES  DOXOrubicin 60 mg/m2 IV on day 1, cyclophosphamide 600 mg/m2 IV on day 1, every 2 weeks for 4 cycles, then PACLitaxel 175 mg/m2 every 2 weeks for 4 cycles      04/20/2020 Surgery    Breast, right, simple mastectomy  -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 Extracapsular extension is identified      Breast, left, simple mastectomy  -Benign breast tissue with no atypia, in situ or invasive carcinoma identified     04/20/2020 -  Cancer Staged    Staging form: Breast, AJCC 8th Edition  - Pathologic stage from 04/20/2020: No Stage Recommended (ypT1c, pN1a, cM0, G2, ER+, PR+, HER2-) - Signed by [name], MD on 04/25/2020       05/07/2020 Surgery    Lymph nodes, right axillary, dissection  -One of sixteen lymph nodes involved by metastatic adenocarcinoma (1/16)  -Metastatic focus measures 3 mm in greatest dimension  -No extracapsular extension is identified     06/28/2020 - 08/06/2020 Radiation    The total radiation dose will be 5000 cGy at 200 cGy/fraction for a total of 25 fractions, treated once a day     08/13/2020 Endocrine/Hormone Therapy    Letrozole 2.5mg  +Ovarian suppression (  monthly lupron)     Malignant neoplasm of right breast in female, estrogen receptor positive (CMS-HCC)   05/07/2020 Surgery    Lymph nodes, right axillary, dissection  -One of sixteen lymph nodes involved by metastatic adenocarcinoma (1/16)  -Metastatic focus measures 3 mm in greatest dimension  -No extracapsular extension is identified     06/13/2020 -  Radiation    Radiation Therapy Treatment Details (Noted on 06/13/2020)  Site: Right Breast - Overlapping sites  Technique: 3D CRT  Goal: No goal specified  Planned Treatment Start Date: No planned start date specified     06/21/2020 -  Radiation    Radiation Therapy Treatment Details (Noted on 06/21/2020)  Site: Right Breast - Overlapping sites  Technique: 3D CRT  Goal: No goal specified  Planned Treatment Start Date: No planned start date specified           Assessment/Plan:    Assessment  Assessment details:    Myofascial restriction and muscle tightness in R axilla, shoulder, pec; improved following treatment. Good form with exercises, improved periscapular ms recruitment.  32 y.o. female presents with Stage I lymphedema secondary to breast cancer treatment.  In addition to lymphedema , the patient is experiencing these side effects from cancer treatments: Myofascial/scar restrictions post op  Radiation: Fibrosis/myofascial restrictions  Currently the right limb is 7 % bigger than the left limb.   Patient requires skilled Physical Therapy services  for the following problem list and secondary functional limitations:    Problem List:   Lack of knowledge of lymphedema/edema self care  Uncontrolled swelling and/or lack of appropriate compression garment    Secondary Functional Limitations:  Decreased knowledge of self care  of lymphedema/edema puts patient at risk for increased swelling and infection  Uncontrolled swelling can increase chances of  infection, and limit functional ROM             Impairments: postural weakness, increased edema, impaired sensation, muscular restrictions, uncontrolled swelling, impaired ADLs, decreased skin integrity and decreased knowledge of self-care                Prognosis: excellent prognosis    Positive Prognosis Rationale: motivated for treatment, caregiver/family support, chronicity of condition, severity of symptoms, response to trial tx and previous tx with benefit.    Barriers to therapy: none identified    Therapy Goals      Goals:      Short Term Goals to be addressed in 4 weeks:  1. Pt will be independent with home stretching, ROM HEP to allow progression towards long-term goals, potentially increased comfort, ROM.   2. Pt will participate in MLD, be educated in self MLD to promote improved fluid transport, potentially decreased R breast and/or UE edema.  3. Pt will be able to articulate skin care recommendations, as well as signs/symptoms of infection, to promote long-term skin health, infection prevention, long-term maintenance of condition.    Long-Term Goals to be addressed in 8 weeks:  1. Pt will receive assistance with compression garment fitting and procurement, allowing potentially increased comfort, long-term maintenance of condition.   2. Patient to demonstrate reduction in R UE limb volume to no more than 12% larger than L UE.      Plan    Therapy options: will be seen for skilled physical therapy services    Planned therapy interventions: Body Mechanics Training, Compression Bandaging, Compression Pump, Diaphragmatic/Pursed-lip Breathing, Dry Needling, E-Stim, Education Therapist, art, Education - Patient, Endurance Activites, Functional Mobility, Arboriculturist, Home  Exercise Program, Manual Lymph Drainage, Manual Therapy, Neuromuscular Re-education, Postural Training, Self-Care/Home Training, Taping, TENS, Therapeutic Activities and Therapeutic Exercises      Frequency: 1x week    Duration in weeks: 8 weeks    Education provided to: patient.    Education provided: Equipment recommendations, HEP, Garment options, Manual lymph drainage and Treatment options and plan    Education results: needs reinforcement and needs further instruction.      Next visit plan:        Soft tissue mobilization  Postural exercises    Total Session Time: 55    Treatment rendered today:      ______________________________________________________________________    Manual Therapy x 55'  -cold laser-assisted MFR Settings: Acute, pulsed high  Duty Cycle: 43 seconds, 5.3 J/cm2  -soft tissue mobilization to R axilla including stretching, fascial mobilization, trigger point release, scar tissue mobilization  -manual lymphatic drainage to address R UE    ______________________________________________________________________          Subjective:   History of Present Condition  Date of Surgery: 05/07/2020 ([facility])    History of Present Condition/Chief Complaint:       R UE lymphedema    Subjective:     Feels like her ROM is restricted by a taut band that runs from her shoulder, under her implant, to middle ribs.    Therapist wore a mask for the entire session.     I reviewed the no-show/attendance policy with the patient and caregiver(s). The patient and/or family is aware that they must call to cancel appointments more than 24 hours in advance. They are also aware that if they late cancel (less than 24 hours from appointment), arrives greater than 15 minutes late, or no-show three times, we reserve the right to cancel their remaining appointments. This policy is in place to allow [place] to best serve the needs of our caseload.      Quality of life: good    Pain  Current pain rating: 0  At best pain rating: 0  At worst pain rating: 4  Location: R arm above elbow      Precautions and Equipment  Precautions: Cancer history  Prior Functional Status:     Functional Limitation(s)-No physical limitations  Current functional status: limited recreation and limited exercise        Treatments  Previous treatment: physical therapy      Patient Goals  Patient goals for therapy: decreased edema  Patient goal: manage symptoms before surgery      Objective:   Lymphedema                       Inital values taken: 02/28/2022  9:01 AM   Involved arm: Right   Dominant arm: Right   Patient position: Seated   Total volume: right 3415.58 mL 06/24/2022  7:43 AM   Total volume: left 3189.63 mL 06/24/2022  7:43 AM   L/R difference +225.95 mL +7.08%   R diff from initial -939.83 mL -21.58%   R diff from last -939.83 mL -21.58%          Inital values taken: 02/28/2022  9:01 AM   Involved arm: Right   Dominant arm: Right   Patient position: Seated   Total volume: right 3415.58 mL 06/24/2022  7:43 AM   Total volume: left 3189.63 mL 06/24/2022  7:43 AM   L/R difference +225.95 mL +7.08%   R diff from initial -939.83 mL -21.58%   R  diff from last -939.83 mL -21.58%                                         I attest that I have reviewed the above information.  Signed: Edwena Bunde, PT  09/09/2022 8:38 AM

## 2022-09-12 ENCOUNTER — Institutional Professional Consult (permissible substitution): Admit: 2022-09-12 | Discharge: 2022-09-13 | Payer: PRIVATE HEALTH INSURANCE

## 2022-09-12 DIAGNOSIS — C50911 Malignant neoplasm of unspecified site of right female breast: Principal | ICD-10-CM

## 2022-09-12 DIAGNOSIS — Z170 Estrogen receptor positive status [ER+]: Principal | ICD-10-CM

## 2022-09-12 MED ADMIN — leuprolide (LUPRON) 3.75 mg injection: INTRAMUSCULAR | @ 14:00:00 | Stop: 2022-09-12

## 2022-09-12 MED ADMIN — leuprolide (LUPRON) injection 3.75 mg: 3.75 mg | INTRAMUSCULAR | @ 14:00:00 | Stop: 2022-09-12

## 2022-09-12 NOTE — Unmapped (Signed)
0950 Administered Lupron and patient tolerated well. Applied band-aid .

## 2022-09-18 NOTE — Unmapped (Signed)
Patient called in and had expected to be added on 4/19 at 8 am with Tami Gibson, contacted [facility] and she said to add her on at 8:30 am. Pt verbally confirmed appt date and time

## 2022-09-19 ENCOUNTER — Ambulatory Visit
Admit: 2022-09-19 | Discharge: 2022-09-20 | Payer: PRIVATE HEALTH INSURANCE | Attending: Adult Health | Primary: Adult Health

## 2022-09-19 DIAGNOSIS — Z9889 Other specified postprocedural states: Principal | ICD-10-CM

## 2022-09-19 DIAGNOSIS — C50911 Malignant neoplasm of unspecified site of right female breast: Principal | ICD-10-CM

## 2022-09-19 DIAGNOSIS — Z170 Estrogen receptor positive status [ER+]: Principal | ICD-10-CM

## 2022-09-19 DIAGNOSIS — N644 Mastodynia: Principal | ICD-10-CM

## 2022-09-19 MED ORDER — GABAPENTIN 100 MG CAPSULE
ORAL_CAPSULE | ORAL | 3 refills | 0 days | Status: CP
Start: 2022-09-19 — End: 2023-09-19

## 2022-09-19 NOTE — Unmapped (Signed)
Patient Name: Tami Gibson  Patient Age: 32 y.o.  Encounter Date: 09/19/2022    Referring Physician:   Rudie Meyer, ANP  [address]  [facility] [address]; [address] [place],  [place] [address]    Primary Care Provider:  Durenda Hurt, MD    Breast Cancer Treatment Team:  Surgical Oncology: Jobe Marker, MD  Surgical Oncology NP: Pollyann Samples, NP  Radiation Oncology: Rogelio Seen, MD  Medical Oncology: Adonis Brook, MD  Plastic Surgery: Lamarr Lulas, MD  Genetics: [employer] STAT panel    Reason for Visit: Acute visit, right breast pain.     HPI:  Tami Gibson is a 32 y.o. female who presents for followup after breast cancer surgery. Below is a synopsis of past and present health information pertinent to her breast cancer diagnosis, treatment and current health status. She is accompanied today by her husband, who also contributes to her history. Tayte was last seen in clinic on 06/27/2022.  She contacted medical oncology earlier this week with complaints of right breast pain for which she was recommended for follow-up in our office. She has been undergoing PT for lymphedema and fibrosis of the skin/myofascial restrictions.  She has been treated with manual lymph drainage, trigger point release, scar tissue mobilization, cold laser.  Her last session was 09/09/22. She feels her lymphedema is under good control. She notes that on Sunday night/Monday morning, she noticed zingers/sharp/shooting/burning/throbbing pain over the upper portion of her implant that radiates from approximately the 1 o'clock region to the central breast mound. She did ride the get ski on Saturday, but did not do any other water activities. She denies that her implant flips.  She denies any new workouts.  She has tried compression for the pain without relief. The pain has awoken her from sleep. This is how she presented initially, which naturally has raised her concern. Otherwise, she continues on Verzenio, Femara and Lupron.  She believes she will complete her Verzenio at the end of May. She inquires about laser therapy for the scars retraction on the right.  She would like to take a year off from any surgeries, but will reconsider in 2025. Otherwise, since that visit she denies associated symptoms including redness, fever, chills, breast mass, breast retraction, breast swelling or nipple discharge. No other specific complaints today. No other changes to her medical, surgical, family or social history.     Hava initially presented with significant right breast pain.  Her PCP referred her for imaging evaluation.  Mammography/ultrasound showed a 2 cm spiculated mass in the lower outer right breast as well as at least 3 abnormal axillary nodes. Biopsy revealed right breast IDC, G1, ER/PR+ and HER2 -. At our initial visit, I determined [name] would need additional workup prior to final surgical planning. She had an enlarged axillary node at this time and was recommended biopsy of the node. This came back as positive for metastatic carcinoma and she was referred for neoadjuvant chemotherapy. She underwent ddAC followed by ddTaxol under the direction of Dr. Laney Pastor completed in October 2021. Given residual disease in the right axilla after neoadjuvant chemotherapy after bilateral SSM, right targeted axillary dissection (TAD) protocol and infusaport removal on 04/20/2020, the patient underwent right ALND with ARM on 05/07/2020.    Diagnosis:    Right breast infiltrating ductal carcinoma   Cancer Staging   Malignant neoplasm of right breast in female, estrogen receptor positive (CMS-HCC)  Staging form: Breast, AJCC 8th Edition  - Clinical: Stage  IB (cT1c, cN1, cM0, G1, ER+, PR+, HER2-) - Signed by [name], MD on 12/02/2019  - Pathologic stage from 04/20/2020: No Stage Recommended (ypT1c, pN1a, cM0, G2, ER+, PR+, HER2-) - Signed by [name], MD on 04/25/2020      Procedure:   Bilateral skin-sparing mastectomy, right targeted axillary dissection (TAD) protocol and infusaport removal, performed on 04/20/2020.  Right axillary lymph node dissection with right axillary reverse mapping procedure, performed on 05/07/2020      Clinical Trial Participant:   No clinical trials at this time.    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       Medical history reviewed as below. I have also reviewed additional records as provided.  Hematology/Oncology History Overview Note   32 year old female with recently self palpated right breast lump. A targeted right breast ultrasound on 11/15/2019 showed an irregular mass with spiculated margins at the 7:00 position, 6 cm from the nipple, measuring 1.4 x 1.2 x 1.0 cm. There were 2 abnormal level 1 axillary lymph nodes measuring 1.4 x 1.3 cm and 1.2 x 0.9 cm. Additionally, there was a high level 1/level 2 axillary lymph node, measuring 2.1 x 1.3 cm with an effaced fatty hilum.      Malignant neoplasm of right breast in female, estrogen receptor positive (CMS-HCC)   11/09/2019 Initial Diagnosis    Malignant neoplasm of right breast in female, estrogen receptor positive (CMS-HCC)     11/15/2019 Biopsy    Invasive ductal carcinoma  - Nottingham combined histologic grade: 1  ER 91-100%, PR 91-100%, HER2 negative by IHC    Lymph node, right axilla, core biopsy  - Lymph node positive for metastatic carcinoma,      12/02/2019 -  Cancer Staged    Staging form: Breast, AJCC 8th Edition  - Clinical: Stage IB (cT1c, cN1, cM0, G1, ER+, PR+, HER2-) - Signed by [name], MD on 12/02/2019       12/09/2019 - 03/22/2020 Chemotherapy    OP BREAST AC (Dose Dense) q2W X 4 CYCLES, THEN PACLITAXEL (DOSE DENSE) Q2W x 4 CYCLES  DOXOrubicin 60 mg/m2 IV on day 1, cyclophosphamide 600 mg/m2 IV on day 1, every 2 weeks for 4 cycles, then PACLitaxel 175 mg/m2 every 2 weeks for 4 cycles      04/20/2020 Surgery    Breast, right, simple mastectomy  -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 Extracapsular extension is identified      Breast, left, simple mastectomy  -Benign breast tissue with no atypia, in situ or invasive carcinoma identified     04/20/2020 -  Cancer Staged    Staging form: Breast, AJCC 8th Edition  - Pathologic stage from 04/20/2020: No Stage Recommended (ypT1c, pN1a, cM0, G2, ER+, PR+, HER2-) - Signed by [name], MD on 04/25/2020       05/07/2020 Surgery    Lymph nodes, right axillary, dissection  -One of sixteen lymph nodes involved by metastatic adenocarcinoma (1/16)  -Metastatic focus measures 3 mm in greatest dimension  -No extracapsular extension is identified  06/28/2020 - 08/06/2020 Radiation    The total radiation dose will be 5000 cGy at 200 cGy/fraction for a total of 25 fractions, treated once a day     08/13/2020 Endocrine/Hormone Therapy    Letrozole 2.5mg  +Ovarian suppression (monthly lupron)     Malignant neoplasm of right breast in female, estrogen receptor positive (CMS-HCC)   05/07/2020 Surgery    Lymph nodes, right axillary, dissection  -One of sixteen lymph nodes involved by metastatic adenocarcinoma (1/16)  -Metastatic focus measures 3 mm in greatest dimension  -No extracapsular extension is identified     06/13/2020 -  Radiation    Radiation Therapy Treatment Details (Noted on 06/13/2020)  Site: Right Breast - Overlapping sites  Technique: 3D CRT  Goal: No goal specified  Planned Treatment Start Date: No planned start date specified     06/21/2020 -  Radiation    Radiation Therapy Treatment Details (Noted on 06/21/2020)  Site: Right Breast - Overlapping sites  Technique: 3D CRT  Goal: No goal specified  Planned Treatment Start Date: No planned start date specified       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    [facility]    Gestational hypertension 01/30/2017    end of pregnancy had hypertension, was induced    Headache     migraine occasionally    Hypertension     during week 37 week, pt induced and had C/S due to hypertension    IIH (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/2015    followed by ophthalmologist    Kidney stone     Malignant neoplasm of right breast in female, 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/30/2019    Pyelonephritis 09/2016    Kidney stones    Urinary tract infection     Had 5-6 in Lifetime, esp high school and college age    Varicella     during childhood    Vitamin B 12 deficiency      Past Surgical History:   Procedure Laterality Date    BREAST BIOPSY Right     IDC    CESAREAN SECTION  01/30/2017    breech    CHEMOTHERAPY      IR INSERT PORT AGE GREATER THAN 5 YRS  12/08/2019    IR INSERT PORT AGE GREATER THAN 5 YRS 12/08/2019 Jobe Gibbon, MD IMG VIR H&V [facility]    LUMBAR PUNCTURE DIAGNOSTIC [facility] HISTORICAL RESULT)  01/2015    to relieve ICP    PR BX/REMV,LYMPH NODE,DEEP AXILL Right 04/20/2020    Procedure: BX/EXC LYMPH NODE; OPEN, DEEP AXILRY NODE;  Surgeon: Moss Mc, MD;  Location: MAIN OR [facility];  Service: Surgical Oncology Breast    PR CESAREAN DELIVERY ONLY N/A 01/30/2017    Procedure: CESAREAN DELIVERY ONLY;  Surgeon: Asher Muir, MD;  Location: L&D C-SECTION OR SUITES [facility];  Service: Maternal-Fetal Medicine    PR CESAREAN DELIVERY ONLY N/A 02/17/2019    Procedure: CESAREAN DELIVERY ONLY;  Surgeon: Lonny Prude, MD;  Location: L&D C-SECTION OR SUITES [facility];  Service: Maternal-Fetal Medicine PR GRAFTING OF AUTOLOGOUS FAT BY LIPO 50 CC OR LESS Bilateral 04/16/2022    Procedure: GRAFTING OF AUTOLOGOUS FAT HARVESTED BY LIPOSUCTION TECHNIQUE TO TRUNK, BREASTS, SCALP, ARMS, AND/OR LEGS; 50 CC OR  LESS INJECTATE;  Surgeon: Arsenio Katz, MD;  Location: MAIN OR [place];  Service: Plastics    PR IMPLNT BIO IMPLNT FOR SOFT TISSUE REINFORCEMENT Bilateral 04/20/2020    Procedure: IMPLANTATION BIOLOGIC IMPLANT(EG, ACELLULAR DERMAL MATRIX) FOR SOFT TISSUE REINFORCEMENT(EG, BREAST, TRUNK);  Surgeon: Arsenio Katz, MD;  Location: MAIN OR [facility];  Service: Plastics    PR INSJ/RPLCMT BREAST IMPLANT SEP DAY MASTECTOMY Right 04/16/2022    Procedure: INSERTION OR REPLACEMENT OF BREAST IMPLANT ON SEPERATE DAY FROM MASTECTOMY;  Surgeon: Arsenio Katz, MD;  Location: MAIN OR [place];  Service: Plastics    PR INTRAOPERATIVE SENTINEL LYMPH NODE ID W DYE INJECTION Right 04/20/2020    Procedure: INTRAOPERATIVE IDENTIFICATION SENTINEL LYMPH NODE(S) INCLUDE INJECTION NON-RADIOACTIVE DYE, WHEN PERFORMED;  Surgeon: Moss Mc, MD;  Location: MAIN OR [place];  Service: Surgical Oncology Breast    PR INTRAOPERATIVE SENTINEL LYMPH NODE ID W DYE INJECTION Right 05/07/2020    Procedure: INTRAOPERATIVE IDENTIFICATION SENTINEL LYMPH NODE(S) INCLUDE INJECTION NON-RADIOACTIVE DYE, WHEN PERFORMED;  Surgeon: Moss Mc, MD;  Location: MAIN OR [place];  Service: Surgical Oncology Breast    PR MASTECTOMY, SIMPLE, COMPLETE Bilateral 04/20/2020    Procedure: MASTECTOMY, SIMPLE, COMPLETE;  Surgeon: Moss Mc, MD;  Location: MAIN OR [facility];  Service: Surgical Oncology Breast    PR REMOVE ARMPITS LYMPH NODES COMPLT Right 05/07/2020    Procedure: AXILLARY LYMPHADENECTOMY; COMPLETE;  Surgeon: Moss Mc, MD;  Location: MAIN OR [facility];  Service: Surgical Oncology Breast    PR REPLACEMENT TISSUE EXPANDER W/PERMANENT IMPLANT Bilateral 08/13/2021    Procedure: REPLACEMENT OF TISSUE EXPANDER WITH PERMANENT IMPLANT;  Surgeon: Arsenio Katz, MD;  Location: MAIN OR [facility];  Service: Plastics    PR REVISION OF RECONSTRUCTED BREAST Bilateral 04/16/2022    Procedure: REVISION OF RECONSTRUCTED BREAST;  Surgeon: Arsenio Katz, MD;  Location: MAIN OR [place];  Service: Plastics    PR TISSUE EXPANDER PLACEMENT BREAST RECONSTRUCTION Bilateral 04/20/2020    Procedure: TISSUE EXPANDER PLACEMENT IN BREAST RECONSTRUCTION, INCLUDING SUBSEQUENT EXPANSION(S);  Surgeon: Arsenio Katz, MD;  Location: MAIN OR ;  Service: Plastics    TONSILECTOMY, ADENOIDECTOMY, BILATERAL MYRINGOTOMY AND TUBES      WISDOM TOOTH EXTRACTION  2004     Family History   Problem Relation Age of Onset    Cancer Maternal Aunt 45        breast Ca    Hypertension Father     Other Father 103        Benign brain tumor    Hypothyroidism Mother     Cancer Paternal Aunt         Breast Ca dx in 28s    ALS Maternal Aunt     Ovarian cancer Maternal Grandmother 39    No Known Problems Maternal Grandfather     Cancer Paternal Grandmother         Unknown Cancer    No Known Problems Paternal Grandfather     No Known Problems Maternal Aunt     No Known Problems Maternal Aunt     Breast cancer Maternal Cousin         Dx in 79s    No Known Problems Paternal Aunt     No Known Problems Daughter     No Known Problems Daughter     No Known Problems Other     BRCA 1/2 Neg Hx     Colon cancer Neg Hx     Endometrial cancer Neg Hx  Social History     Socioeconomic History    Marital status: Married     Spouse name: Dylan    Number of children: 0   Occupational History    Occupation: Architect    Tobacco Use    Smoking status: Never     Passive exposure: Never    Smokeless tobacco: Never   Vaping Use    Vaping status: Never Used   Substance and Sexual Activity    Alcohol use: Not Currently    Drug use: No    Sexual activity: Yes     Partners: Male     Birth control/protection: Pill   Other Topics Concern    Exercise Yes    Living Situation No   Social History Narrative    PRECONCEPTUAL ASSESSMENT:        Amishi is a 32 y.o. Caucasian female    Caffeine:denies use    Cats: no    Exercise: not active    Diet: balanced    Family hx of of birth defects, chromosomal abnormality, intellectual disability, developmental delay: no.            PARTNER HISTORY:     Domingo Dimes is a 61 y.o.  Caucasian female.    Occupation:  Immunologist; [employer]    He has fathered children:  no    He is healthy with no chronic illness    Family history of  birth defects, chromosomal abnormality, intellectual disability, or developmental delay: no.              Social Determinants of Health     Financial Resource Strain: Low Risk  (04/21/2020)    Overall Financial Resource Strain (CARDIA)     Difficulty of Paying Living Expenses: Not very hard   Food Insecurity: No Food Insecurity (04/21/2020)    Hunger Vital Sign     Worried About Running Out of Food in the Last Year: Never true     Ran Out of Food in the Last Year: Never true   Transportation Needs: No Transportation Needs (04/21/2020)    PRAPARE - Therapist, art (Medical): No     Lack of Transportation (Non-Medical): No       Current Outpatient Medications:     abemaciclib (VERZENIO) 100 mg Tab tablet, Take 1 tablet (100 mg total) by mouth Two (2) times a day. Take with or without food., Disp: 56 tablet, Rfl: 5    acetaminophen (TYLENOL) 325 MG tablet, Take 2 tablets (650 mg total) by mouth every six (6) hours as needed for pain., Disp: , Rfl:     calcium carbonate (TUMS) 200 mg calcium (500 mg) chewable tablet, Chew 1 tablet (200 mg of elem calcium total) daily., Disp: , Rfl:     cetirizine (ZYRTEC) 10 MG tablet, Take 1 tablet (10 mg total) by mouth daily., Disp: 30 tablet, Rfl: 2    cholecalciferol, vitamin D3-25 mcg, 1,000 unit,, 25 mcg (1,000 unit) capsule, Take 1 capsule (25 mcg total) by mouth., Disp: , Rfl:     cyanocobalamin, vitamin B-12, 1000 MCG tablet, Take 1 tablet (1,000 mcg total) by mouth daily., Disp: , Rfl:     docusate sodium (COLACE) 100 MG capsule, Take 1 capsule (100 mg total) by mouth nightly as needed (PRN)., Disp: , Rfl:     escitalopram oxalate (LEXAPRO) 20 MG tablet, Take 1 tablet by mouth once daily, Disp: 90 tablet, Rfl: 3    gabapentin (NEURONTIN) 100  MG capsule, Take 1 capsule (100 mg total) by mouth Take as directed. Take 1 to 3 capsules three times daily for neuropathic pain, Disp: 270 capsule, Rfl: 3    letrozole (FEMARA) 2.5 mg tablet, Take 1 tablet (2.5 mg total) by mouth daily., Disp: 90 tablet, Rfl: 3    leuprolide (LUPRON) 3.75 mg injection, Inject 3.75 mg into the muscle., Disp: , Rfl:     omeprazole (PRILOSEC) 20 MG capsule, Take 1 capsule by mouth once daily, Disp: 90 capsule, Rfl: 0    ondansetron (ZOFRAN-ODT) 4 MG disintegrating tablet, Take 1 tablet in mouth every 8 hours as needed for nausea, Disp: 18 tablet, Rfl: 0    rimegepant (NURTEC ODT) 75 mg TbDL, Take 1 tablet (75 mg total) by mouth daily as needed., Disp: , Rfl:     SUMAtriptan (IMITREX) 100 MG tablet, TAKE ONE TABLET BY MOUTH AT ONSET OF MIGRAINE FOR UP TO 1 DOSE . IF SYMPTOMS PERSIST A SECOND DOSE MAY BE TAKEN IN 2 HOURS IF NEEDED (Patient not taking: Reported on 06/27/2022), Disp: , Rfl:     topiramate (TOPAMAX) 25 MG tablet, TAKE 3 TABLETS BY MOUTH ONCE DAILY, Disp: , Rfl:     traZODone (DESYREL) 50 MG tablet, TAKE 1/2 TO 1 (ONE-HALF TO ONE) TABLET BY MOUTH NIGHTLY AS NEEDED FOR SLEEP, Disp: 30 tablet, Rfl: 0    vitamin E-134 mg, 200 UNIT, 134 mg (200 UNIT) capsule, Take 1 capsule (134 mg total) by mouth daily., Disp: , Rfl:     WEGOVY 2.4 MG/0.75 ML SUBCUTANEOUS PEN INJECTOR, Inject 2.4 mg under the skin every seven (7) days., Disp: 4 mL, Rfl: 3  No current facility-administered medications for this visit.    Facility-Administered Medications Ordered in Other Visits:     leuprolide (LUPRON) 3.75 mg injection, , , ,   No Known Allergies      Physical Examination:   Blood pressure 138/94, pulse 90, temperature 36.1 ??C (97 ??F), temperature source Temporal, resp. rate 18, weight 92.1 kg (203 lb), last menstrual period 12/15/2019, SpO2 98%, not currently breastfeeding.  General Appearance:  No acute distress, well appearing and well nourished.   Head:  Normocephalic, atraumatic.   Eyes:  Conjuctiva and lids appear normal. Pupils equal and round,   sclera anicteric.   Ears:  Overall appearance normal with no scars, lesions or               masses.  Hearing is grossly normal.   Neck: Supple, symmetrical, trachea midline, no adenopathy;   thyroid: no enlargement/tenderness/nodules.   Pulmonary:    Normal respiratory effort.    Musculoskeletal: Normal gait. Extremities without clubbing, cyanosis, or           edema.   Skin: Skin color, texture, turgor normal, no rashes or lesions.   Neurologic: No motor abnormalities noted. Sensation grossly intact.   Lymphatic:  Breast: No cervical or supraclavicular LAD noted.   A comprehensive examination of the breasts and chest wall was performed with the patient upright and supine with arms at her sides and above her head.   Right breast is surgically absent with well healed mastectomy scar. The native nipple is surgically absent. The breast has been reconstructed with implant based reconstruction. There is mild contracture of the implant compared to the left. There is a tight band of tissue running along the superior portion of the implant. The incision is mildly retracted. No palpable nodules, skin lesions, or erythema.There is no tenderness along the  scapula that radiates to the breast.  infraclavicular and supraclavicular nodal examinations are negative. Arm circumference is increased consistent with lymphedema. Cosmetic outcome is excellent.  Left breast is surgically absent with well healed mastectomy scar. The native nipple is surgically absent. The breast has been reconstructed with implant based reconstruction.  No palpable nodules, skin lesions, erythema, or areas of concern.   Psychiatric: Judgement and insight appropriate. Oriented to person,         place, and time.       Imaging Review:  None indicated due to mastectomies.     I have personally reviewed the breast imaging, laboratory values, existing medical records, and pathology. I have summarized these findings and my interpretation in the oncology history and assessment above.    Impression:  Ms. Love is a 32 y.o. female with a history of right breast infiltrating ductal carcinoma, estrogen receptor positive, progesterone receptor positive and HER2 negative. See treatment summary above.   Staging   Cancer Staging   Malignant neoplasm of right breast in female, estrogen receptor positive (CMS-HCC)  Staging form: Breast, AJCC 8th Edition  - Clinical: Stage IB (cT1c, cN1, cM0, G1, ER+, PR+, HER2-) - Signed by [name], MD on 12/02/2019  - Pathologic stage from 04/20/2020: No Stage Recommended (ypT1c, pN1a, cM0, G2, ER+, PR+, HER2-) - Signed by [name], MD on 04/25/2020    There is no evidence of disease at 2 years.    Recommendations:  Continue current therapy as prescribed by medical oncology.   We discussed that her pain is neuropathic in nature. We discussed the etiology is not entirely clear, but likely related to prior mastectomy and radiation. However, given the band of tissue along the superior portion of the implant as well as her this being consistent with her initial presentation, discussed work-up with an MRI.  This will also allow [place] to take a better look at her implant than a mammogram and ultrasound. This will be scheduled her convenience. I will notify her of these results, once available.   Given the neuropathic nature of her pain, discussed options for management of her pain.Encouraged NSAIDs, heat and stretching.  Also discussed gabapentin.  Side effects and dosing instructions were reviewed.  She was provided with a prescription for this at today's visit.  She will start at 100mg  tid.  If ineffective, discussed that she may increase to 300mg  tid.   Continue PT, compression and pump for management of lymphedema.  Continue Vit E for hot flashes. Denies the need for additional intervention. Discussed gabapentin may help with this symptom as well.   Offered referral to Golden Hurter, NP for discussion of possible options for her right breast scars.  She wishes to reach out to Ms. Edkins herself.       All of the patient's and family's questions and concerns were addressed during the visit and they are in agreement with the plan of care. Please do not hesitate to contact me with questions or concerns.     Cancer Staging   Malignant neoplasm of right breast in female, estrogen receptor positive (CMS-HCC)  Staging form: Breast, AJCC 8th Edition  - Clinical: Stage IB (cT1c, cN1, cM0, G1, ER+, PR+, HER2-) - Signed by [name], MD on 12/02/2019  - Pathologic stage from 04/20/2020: No Stage Recommended (ypT1c, pN1a, cM0, G2, ER+, PR+, HER2-) - Signed by [name], MD on 04/25/2020      Pollyann Samples, NP-C  Surgical Breast Oncology  09/19/2022  9:24 AM

## 2022-09-19 NOTE — Unmapped (Signed)
It was a pleasure to see you today in the Breast Surgical Oncology Clinic. Please call my nurse navigator, Alinda Dooms or Willia Craze, if you have any interval questions or concerns.     MRI at your convenience. I will notify you of these results, once available.       Surgical oncology contact information:  For appointments, please call [phone].     Nurse Navigator available Monday through Friday 8:30 am to 4:30 pm:   Alinda Dooms, RN or Willia Craze, RN:    Phone: [phone]   Fax: [phone] attention Alinda Dooms, RN or Willia Craze, RN    For emergencies, evenings or weekends, please call [phone] and ask for the surgical oncology resident on call. Please be aware that this person is also responding to in-hospital emergencies and patient issues and may not answer your phone call immediately, but will return your call as soon as possible.

## 2022-09-24 NOTE — Unmapped (Signed)
[facility] THERAPY SERVICES [place]  OUTPATIENT PHYSICAL THERAPY  09/24/2022  Note Type: Treatment Note       Patient Name: Tami Gibson  Date of Birth:10-01-1990  Diagnosis:   Encounter Diagnoses   Name Primary?    Lymphedema syndrome, postmastectomy Yes    Malignant neoplasm of right breast in female, estrogen receptor positive, unspecified site of breast (CMS-HCC)     Scar condition and fibrosis of skin     Lymphedema      Referring MD:  [name], MD    Date of Onset of Impairment-No date available  Date PT Care Plan Established or Reviewed-No date available  Date PT Treatment Started-No date available   Plan of Care Effective Date:     Hematology/Oncology History Overview Note   32 year old female with recently self palpated right breast lump. A targeted right breast ultrasound on 11/15/2019 showed an irregular mass with spiculated margins at the 7:00 position, 6 cm from the nipple, measuring 1.4 x 1.2 x 1.0 cm. There were 2 abnormal level 1 axillary lymph nodes measuring 1.4 x 1.3 cm and 1.2 x 0.9 cm. Additionally, there was a high level 1/level 2 axillary lymph node, measuring 2.1 x 1.3 cm with an effaced fatty hilum.      Malignant neoplasm of right breast in female, estrogen receptor positive (CMS-HCC)   11/09/2019 Initial Diagnosis    Malignant neoplasm of right breast in female, estrogen receptor positive (CMS-HCC)     11/15/2019 Biopsy    Invasive ductal carcinoma  - Nottingham combined histologic grade: 1  ER 91-100%, PR 91-100%, HER2 negative by IHC    Lymph node, right axilla, core biopsy  - Lymph node positive for metastatic carcinoma,      12/02/2019 -  Cancer Staged    Staging form: Breast, AJCC 8th Edition  - Clinical: Stage IB (cT1c, cN1, cM0, G1, ER+, PR+, HER2-) - Signed by [name], MD on 12/02/2019       12/09/2019 - 03/22/2020 Chemotherapy    OP BREAST AC (Dose Dense) q2W X 4 CYCLES, THEN PACLITAXEL (DOSE DENSE) Q2W x 4 CYCLES  DOXOrubicin 60 mg/m2 IV on day 1, cyclophosphamide 600 mg/m2 IV on day 1, every 2 weeks for 4 cycles, then PACLitaxel 175 mg/m2 every 2 weeks for 4 cycles      04/20/2020 Surgery    Breast, right, simple mastectomy  -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 Extracapsular extension is identified      Breast, left, simple mastectomy  -Benign breast tissue with no atypia, in situ or invasive carcinoma identified     04/20/2020 -  Cancer Staged    Staging form: Breast, AJCC 8th Edition  - Pathologic stage from 04/20/2020: No Stage Recommended (ypT1c, pN1a, cM0, G2, ER+, PR+, HER2-) - Signed by [name], MD on 04/25/2020       05/07/2020 Surgery    Lymph nodes, right axillary, dissection  -One of sixteen lymph nodes involved by metastatic adenocarcinoma (1/16)  -Metastatic focus measures 3 mm in greatest dimension  -No extracapsular extension is identified     06/28/2020 - 08/06/2020 Radiation    The total radiation dose will be 5000 cGy at 200 cGy/fraction for a total of 25 fractions, treated once a day     08/13/2020 Endocrine/Hormone Therapy    Letrozole 2.5mg  +Ovarian suppression (  monthly lupron)     Malignant neoplasm of right breast in female, estrogen receptor positive (CMS-HCC)   05/07/2020 Surgery    Lymph nodes, right axillary, dissection  -One of sixteen lymph nodes involved by metastatic adenocarcinoma (1/16)  -Metastatic focus measures 3 mm in greatest dimension  -No extracapsular extension is identified     06/13/2020 -  Radiation    Radiation Therapy Treatment Details (Noted on 06/13/2020)  Site: Right Breast - Overlapping sites  Technique: 3D CRT  Goal: No goal specified  Planned Treatment Start Date: No planned start date specified     06/21/2020 -  Radiation    Radiation Therapy Treatment Details (Noted on 06/21/2020)  Site: Right Breast - Overlapping sites  Technique: 3D CRT  Goal: No goal specified  Planned Treatment Start Date: No planned start date specified           Assessment/Plan:    Assessment  Assessment details:    Myofascial restriction and muscle tightness in R axilla, shoulder, pec; improved following treatment. Palpable bands of scar tissue superior and medial breast, no reproduction of pain with soft tissue mobilization.   32 y.o. female presents with Stage I lymphedema secondary to breast cancer treatment.  In addition to lymphedema , the patient is experiencing these side effects from cancer treatments: Myofascial/scar restrictions post op  Radiation: Fibrosis/myofascial restrictions  Currently the right limb is 7 % bigger than the left limb.   Patient requires skilled Physical Therapy services  for the following problem list and secondary functional limitations:    Problem List:   Lack of knowledge of lymphedema/edema self care  Uncontrolled swelling and/or lack of appropriate compression garment    Secondary Functional Limitations:  Decreased knowledge of self care  of lymphedema/edema puts patient at risk for increased swelling and infection  Uncontrolled swelling can increase chances of  infection, and limit functional ROM             Impairments: postural weakness, increased edema, impaired sensation, muscular restrictions, uncontrolled swelling, impaired ADLs, decreased skin integrity and decreased knowledge of self-care                Prognosis: excellent prognosis    Positive Prognosis Rationale: motivated for treatment, caregiver/family support, chronicity of condition, severity of symptoms, response to trial tx and previous tx with benefit.    Barriers to therapy: none identified    Therapy Goals      Goals:      Short Term Goals to be addressed in 4 weeks:  1. Pt will be independent with home stretching, ROM HEP to allow progression towards long-term goals, potentially increased comfort, ROM.   2. Pt will participate in MLD, be educated in self MLD to promote improved fluid transport, potentially decreased R breast and/or UE edema.  3. Pt will be able to articulate skin care recommendations, as well as signs/symptoms of infection, to promote long-term skin health, infection prevention, long-term maintenance of condition.    Long-Term Goals to be addressed in 8 weeks:  1. Pt will receive assistance with compression garment fitting and procurement, allowing potentially increased comfort, long-term maintenance of condition.   2. Patient to demonstrate reduction in R UE limb volume to no more than 12% larger than L UE.      Plan    Therapy options: will be seen for skilled physical therapy services    Planned therapy interventions: Body Mechanics Training, Compression Bandaging, Compression Pump, Diaphragmatic/Pursed-lip Breathing, Dry Needling, E-Stim, Education Therapist, art,  Education - Patient, Endurance Activites, Functional Mobility, Garment Measurement, Home Exercise Program, Manual Lymph Drainage, Manual Therapy, Neuromuscular Re-education, Postural Training, Self-Care/Home Training, Taping, TENS, Therapeutic Activities and Therapeutic Exercises      Frequency: 1x week    Duration in weeks: 8 weeks    Education provided to: patient.    Education provided: Equipment recommendations, HEP, Garment options, Manual lymph drainage and Treatment options and plan    Education results: needs reinforcement and needs further instruction.      Next visit plan:        Soft tissue mobilization  Postural exercises    Total Session Time: 55    Treatment rendered today:      ______________________________________________________________________    Manual Therapy x 55'  -soft tissue mobilization to R axilla including stretching, fascial mobilization, trigger point release, scar tissue mobilization  -manual lymphatic drainage to address R UE  -stm to R breast, scar tissue mobilization  -silicone cupping to superior and medial R breast, R forearm  -instructed in silicone cupping at home over lymphatic pathways    ______________________________________________________________________            Subjective:   History of Present Condition  Date of Surgery: 05/07/2020 (ALND)    History of Present Condition/Chief Complaint:       R UE lymphedema    Subjective:     Patient states that she saw surgical oncology yesterday and will have an MRI in a couple of weeks due to consistent pain in the superiomedial aspect of her R breast that was very similar to the pain     Therapist wore a mask for the entire session.     I reviewed the no-show/attendance policy with the patient and caregiver(s). The patient and/or family is aware that they must call to cancel appointments more than 24 hours in advance. They are also aware that if they late cancel (less than 24 hours from appointment), arrives greater than 15 minutes late, or no-show three times, we reserve the right to cancel their remaining appointments. This policy is in place to allow [place] to best serve the needs of our caseload.      Quality of life: good    Pain  Current pain rating: 0  At best pain rating: 0  At worst pain rating: 4  Location: R arm above elbow      Precautions and Equipment  Precautions: Cancer history  Prior Functional Status:     Functional Limitation(s)-No physical limitations  Current functional status: limited recreation and limited exercise        Treatments  Previous treatment: physical therapy      Patient Goals  Patient goals for therapy: decreased edema  Patient goal: manage symptoms before surgery      Objective:   Lymphedema                       Inital values taken: 02/28/2022  9:01 AM   Involved arm: Right   Dominant arm: Right   Patient position: Seated   Total volume: right 3415.58 mL 06/24/2022  7:43 AM   Total volume: left 3189.63 mL 06/24/2022  7:43 AM   L/R difference +225.95 mL +7.08%   R diff from initial -939.83 mL -21.58%   R diff from last -939.83 mL -21.58%          Inital values taken: 02/28/2022  9:01 AM   Involved arm: Right   Dominant arm: Right   Patient  position: Seated   Total volume: right 3415.58 mL 06/24/2022  7:43 AM   Total volume: left 3189.63 mL 06/24/2022  7:43 AM   L/R difference +225.95 mL +7.08%   R diff from initial -939.83 mL -21.58%   R diff from last -939.83 mL -21.58%                                         I attest that I have reviewed the above information.  Signed: Edwena Bunde, PT  09/24/2022 7:40 AM

## 2022-10-03 ENCOUNTER — Ambulatory Visit: Admit: 2022-10-03 | Discharge: 2022-10-03 | Payer: PRIVATE HEALTH INSURANCE

## 2022-10-03 ENCOUNTER — Encounter
Admit: 2022-10-03 | Discharge: 2022-10-03 | Payer: PRIVATE HEALTH INSURANCE | Attending: Anesthesiology | Primary: Anesthesiology

## 2022-10-03 MED ADMIN — Propofol (DIPRIVAN) injection: INTRAVENOUS | @ 16:00:00 | Stop: 2022-10-03

## 2022-10-03 MED ADMIN — sodium chloride (NS) 0.9 % infusion: 10 mL/h | INTRAVENOUS | @ 15:00:00 | Stop: 2022-10-03

## 2022-10-03 MED ADMIN — dexmedeTOMIDine (Precedex) 200 mcg in sodium chloride (NS) 0.9 % 50 mL infusion: INTRAVENOUS | @ 16:00:00 | Stop: 2022-10-03

## 2022-10-03 MED ADMIN — lidocaine (XYLOCAINE) 20 mg/mL (2 %) injection: INTRAVENOUS | @ 16:00:00 | Stop: 2022-10-03

## 2022-10-05 DIAGNOSIS — Z170 Estrogen receptor positive status [ER+]: Principal | ICD-10-CM

## 2022-10-05 DIAGNOSIS — C50911 Malignant neoplasm of unspecified site of right female breast: Principal | ICD-10-CM

## 2022-10-06 ENCOUNTER — Ambulatory Visit
Admit: 2022-10-06 | Payer: PRIVATE HEALTH INSURANCE | Attending: Rehabilitative and Restorative Service Providers" | Primary: Rehabilitative and Restorative Service Providers"

## 2022-10-06 ENCOUNTER — Ambulatory Visit
Admit: 2022-10-06 | Discharge: 2022-10-25 | Payer: PRIVATE HEALTH INSURANCE | Attending: Rehabilitative and Restorative Service Providers" | Primary: Rehabilitative and Restorative Service Providers"

## 2022-10-06 NOTE — Unmapped (Signed)
[facility] THERAPY SERVICES [place]  OUTPATIENT PHYSICAL THERAPY  10/06/2022  Note Type: Treatment Note       Patient Name: Tami Gibson  Date of Birth:August 20, 1990  Diagnosis:   Encounter Diagnoses   Name Primary?    Lymphedema syndrome, postmastectomy Yes    Scar condition and fibrosis of skin     Lymphedema      Referring MD:  [name], MD    Date of Onset of Impairment-No date available  Date PT Care Plan Established or Reviewed-No date available  Date PT Treatment Started-No date available   Plan of Care Effective Date:     Hematology/Oncology History Overview Note   32 year old female with recently self palpated right breast lump. A targeted right breast ultrasound on 11/15/2019 showed an irregular mass with spiculated margins at the 7:00 position, 6 cm from the nipple, measuring 1.4 x 1.2 x 1.0 cm. There were 2 abnormal level 1 axillary lymph nodes measuring 1.4 x 1.3 cm and 1.2 x 0.9 cm. Additionally, there was a high level 1/level 2 axillary lymph node, measuring 2.1 x 1.3 cm with an effaced fatty hilum.      Malignant neoplasm of right breast in female, estrogen receptor positive (CMS-HCC)   11/09/2019 Initial Diagnosis    Malignant neoplasm of right breast in female, estrogen receptor positive (CMS-HCC)     11/15/2019 Biopsy    Invasive ductal carcinoma  - Nottingham combined histologic grade: 1  ER 91-100%, PR 91-100%, HER2 negative by IHC    Lymph node, right axilla, core biopsy  - Lymph node positive for metastatic carcinoma,      12/02/2019 -  Cancer Staged    Staging form: Breast, AJCC 8th Edition  - Clinical: Stage IB (cT1c, cN1, cM0, G1, ER+, PR+, HER2-) - Signed by [name], MD on 12/02/2019       12/09/2019 - 03/22/2020 Chemotherapy    OP BREAST AC (Dose Dense) q2W X 4 CYCLES, THEN PACLITAXEL (DOSE DENSE) Q2W x 4 CYCLES  DOXOrubicin 60 mg/m2 IV on day 1, cyclophosphamide 600 mg/m2 IV on day 1, every 2 weeks for 4 cycles, then PACLitaxel 175 mg/m2 every 2 weeks for 4 cycles      04/20/2020 Surgery Breast, right, simple mastectomy  -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 Extracapsular extension is identified      Breast, left, simple mastectomy  -Benign breast tissue with no atypia, in situ or invasive carcinoma identified     04/20/2020 -  Cancer Staged    Staging form: Breast, AJCC 8th Edition  - Pathologic stage from 04/20/2020: No Stage Recommended (ypT1c, pN1a, cM0, G2, ER+, PR+, HER2-) - Signed by [name], MD on 04/25/2020       05/07/2020 Surgery    Lymph nodes, right axillary, dissection  -One of sixteen lymph nodes involved by metastatic adenocarcinoma (1/16)  -Metastatic focus measures 3 mm in greatest dimension  -No extracapsular extension is identified     06/28/2020 - 08/06/2020 Radiation    The total radiation dose will be 5000 cGy at 200 cGy/fraction for a total of 25 fractions, treated once a day     08/13/2020 Endocrine/Hormone Therapy    Letrozole 2.5mg  +Ovarian suppression (monthly lupron)     Malignant neoplasm of right breast in female, estrogen receptor positive (CMS-HCC)   05/07/2020 Surgery  Lymph nodes, right axillary, dissection  -One of sixteen lymph nodes involved by metastatic adenocarcinoma (1/16)  -Metastatic focus measures 3 mm in greatest dimension  -No extracapsular extension is identified     06/13/2020 -  Radiation    Radiation Therapy Treatment Details (Noted on 06/13/2020)  Site: Right Breast - Overlapping sites  Technique: 3D CRT  Goal: No goal specified  Planned Treatment Start Date: No planned start date specified     06/21/2020 -  Radiation    Radiation Therapy Treatment Details (Noted on 06/21/2020)  Site: Right Breast - Overlapping sites  Technique: 3D CRT  Goal: No goal specified  Planned Treatment Start Date: No planned start date specified           Assessment/Plan:    Assessment  Assessment details:    Myofascial restriction and muscle tightness in R axilla, shoulder, pec; improved following treatment. Palpable bands of scar tissue superior and medial breast, no reproduction of pain with soft tissue mobilization. Scapular mobility improved following mobilizations. Overall patient's ROM and general mobility is functional and pain-free, progressing toward goals.  32 y.o. female presents with Stage I lymphedema secondary to breast cancer treatment.  In addition to lymphedema , the patient is experiencing these side effects from cancer treatments: Myofascial/scar restrictions post op  Radiation: Fibrosis/myofascial restrictions  Currently the right limb is 7 % bigger than the left limb.   Patient requires skilled Physical Therapy services  for the following problem list and secondary functional limitations:    Problem List:   Lack of knowledge of lymphedema/edema self care  Uncontrolled swelling and/or lack of appropriate compression garment    Secondary Functional Limitations:  Decreased knowledge of self care  of lymphedema/edema puts patient at risk for increased swelling and infection  Uncontrolled swelling can increase chances of  infection, and limit functional ROM             Impairments: postural weakness, increased edema, impaired sensation, muscular restrictions, uncontrolled swelling, impaired ADLs, decreased skin integrity and decreased knowledge of self-care                Prognosis: excellent prognosis    Positive Prognosis Rationale: motivated for treatment, caregiver/family support, chronicity of condition, severity of symptoms, response to trial tx and previous tx with benefit.    Barriers to therapy: none identified    Therapy Goals      Goals:      Short Term Goals to be addressed in 4 weeks:  1. Pt will be independent with home stretching, ROM HEP to allow progression towards long-term goals, potentially increased comfort, ROM.   2. Pt will participate in MLD, be educated in self MLD to promote improved fluid transport, potentially decreased R breast and/or UE edema.  3. Pt will be able to articulate skin care recommendations, as well as signs/symptoms of infection, to promote long-term skin health, infection prevention, long-term maintenance of condition.    Long-Term Goals to be addressed in 8 weeks:  1. Pt will receive assistance with compression garment fitting and procurement, allowing potentially increased comfort, long-term maintenance of condition.   2. Patient to demonstrate reduction in R UE limb volume to no more than 12% larger than L UE.      Plan    Therapy options: will be seen for skilled physical therapy services    Planned therapy interventions: Body Mechanics Training, Compression Bandaging, Compression Pump, Diaphragmatic/Pursed-lip Breathing, Dry Needling, E-Stim, Education Therapist, art, Education - Patient, Endurance Activites, Functional Mobility,  Garment Measurement, Home Exercise Program, Manual Lymph Drainage, Manual Therapy, Neuromuscular Re-education, Postural Training, Self-Care/Home Training, Taping, TENS, Therapeutic Activities and Therapeutic Exercises      Frequency: 1x week    Duration in weeks: 8 weeks    Education provided to: patient.    Education provided: Equipment recommendations, HEP, Garment options, Manual lymph drainage and Treatment options and plan    Education results: needs reinforcement and needs further instruction.      Next visit plan:        Soft tissue mobilization  Postural exercises    Total Session Time: 40    Treatment rendered today:      ______________________________________________________________________    Manual Therapy x 40'  -soft tissue mobilization to R axilla including stretching, fascial mobilization, trigger point release, scar tissue mobilization  -manual lymphatic drainage to address R UE  -stm to R breast, scar tissue mobilization  -scapular mobilizations in all planes  -silicone cupping over scapula and periscapular musculature    ______________________________________________________________________            Subjective:   History of Present Condition  Date of Surgery: 05/07/2020 (ALND)    History of Present Condition/Chief Complaint:       R UE lymphedema    Subjective:     Got her cupping set over the weekend, hasn't tried it yet. [place] to [employer] yesterday and was able to fully participate without pain.     Therapist wore a mask for the entire session.     I reviewed the no-show/attendance policy with the patient and caregiver(s). The patient and/or family is aware that they must call to cancel appointments more than 24 hours in advance. They are also aware that if they late cancel (less than 24 hours from appointment), arrives greater than 15 minutes late, or no-show three times, we reserve the right to cancel their remaining appointments. This policy is in place to allow [place] to best serve the needs of our caseload.      Quality of life: good    Pain  Current pain rating: 0  At best pain rating: 0  At worst pain rating: 4  Location: R arm above elbow      Precautions and Equipment  Precautions: Cancer history  Prior Functional Status:     Functional Limitation(s)-No physical limitations  Current functional status: limited recreation and limited exercise        Treatments  Previous treatment: physical therapy      Patient Goals  Patient goals for therapy: decreased edema  Patient goal: manage symptoms before surgery      Objective:   Lymphedema                       Inital values taken: 02/28/2022  9:01 AM   Involved arm: Right   Dominant arm: Right   Patient position: Seated   Total volume: right 3415.58 mL 06/24/2022  7:43 AM   Total volume: left 3189.63 mL 06/24/2022  7:43 AM   L/R difference +225.95 mL +7.08%   R diff from initial -939.83 mL -21.58%   R diff from last -939.83 mL -21.58%          Inital values taken: 02/28/2022  9:01 AM   Involved arm: Right   Dominant arm: Right   Patient position: Seated   Total volume: right 3415.58 mL 06/24/2022  7:43 AM   Total volume: left 3189.63 mL 06/24/2022  7:43 AM   L/R  difference +225.95 mL +7.08%   R diff from initial -939.83 mL -21.58%   R diff from last -939.83 mL -21.58%                                         I attest that I have reviewed the above information.  Signed: Edwena Bunde, PT  10/06/2022 7:47 AM

## 2022-10-09 ENCOUNTER — Ambulatory Visit: Admit: 2022-10-09 | Discharge: 2022-10-09 | Payer: PRIVATE HEALTH INSURANCE

## 2022-10-09 MED ADMIN — gadobenate dimeglumine (MULTIHANCE) 529 mg/mL (0.1mmol/0.2mL) solution 15 mL: 15 mL | INTRAVENOUS | @ 17:00:00 | Stop: 2022-10-09

## 2022-10-10 NOTE — Unmapped (Signed)
Hi,     Demyiah Hallisey contacted the [employer] requesting to speak with the care team of Shaterika Loughman to discuss:    -needing to reschedule her 10/10/22 injection to 10/13/22 at 1pm due to being sick. It  would not allow me to reschedule.     Please contact Elke  at [phone].    [x]  Preferred Name   [x]  DOB and/or MR#  [x]  Preferred Contact Method  [x]  Phone Number(s)   []  MyChart     Thank you,   Durward Fortes  [facility]   [phone]

## 2022-10-13 ENCOUNTER — Ambulatory Visit: Admit: 2022-10-13 | Payer: PRIVATE HEALTH INSURANCE

## 2022-10-15 ENCOUNTER — Institutional Professional Consult (permissible substitution): Admit: 2022-10-15 | Discharge: 2022-10-16 | Payer: PRIVATE HEALTH INSURANCE

## 2022-10-15 MED ADMIN — leuprolide (LUPRON) injection 3.75 mg: 3.75 mg | INTRAMUSCULAR | @ 18:00:00 | Stop: 2022-10-15

## 2022-10-15 NOTE — Unmapped (Signed)
Pt tolerated Lupron injection to right dorsogluteal upper outer quadrant without difficulty. Band aid and gauze placed over injection sites. [facility] left [facility] ambulatory, steady gait, NAD, no questions, complaints, nor concerns voiced at d/c.

## 2022-10-15 NOTE — Unmapped (Signed)
D/w pt that MRI showed:    Impression   1. No MRI findings of malignancy in either breast.   2. No findings of intracapsular or extracapsular implant rupture.      ASSESSMENT:   BI-RADS Category: 2-ClinicExam : Benign.  Clinical follow up.      Recommendation Laterality: Both   RECOMMENDATION: Clinical follow up. Recommend continued clinical follow-up for the right breast symptoms and continued high risk screening MRI as clinically appropriate.       She notes her symptoms are not as intense as prior.  She is using gabapentin, initially only at night, but was not sleeping well. She is now taking this once during the day and it does seem to be helping with her symptoms. She is also following with PT.  She feels her symptoms are also slightly improved with cupping. She has ordered a set for home use as well. Discussed that I am reassured by her imaging and recommend treated with PT and gabapentin. Pt agrees and understands to call for any other questions or concerns.

## 2022-10-20 ENCOUNTER — Ambulatory Visit
Admit: 2022-10-20 | Discharge: 2022-10-21 | Payer: PRIVATE HEALTH INSURANCE | Attending: Internal Medicine | Primary: Internal Medicine

## 2022-10-20 DIAGNOSIS — E669 Obesity, unspecified: Principal | ICD-10-CM

## 2022-10-20 DIAGNOSIS — W57XXXA Bitten or stung by nonvenomous insect and other nonvenomous arthropods, initial encounter: Principal | ICD-10-CM

## 2022-10-20 MED ORDER — DOXYCYCLINE HYCLATE 100 MG TABLET
ORAL_TABLET | Freq: Two times a day (BID) | ORAL | 0 refills | 10 days | Status: CP
Start: 2022-10-20 — End: 2022-10-30

## 2022-10-20 NOTE — Unmapped (Signed)
Patient ID: Tami Gibson is a 32 y.o. female who presents for new concerns of insect bite    Informant: Patient came to appointment alone.    Assessment/Plan:      1. Bug bite with infection, initial encounter  2cm area of warm, erythematous tender induration surrounding area of bite. No EM-like rash. Possibly early cellulitis. Will start empiric doxycycline.   - doxycycline (VIBRA-TABS) 100 MG tablet; Take 1 tablet (100 mg total) by mouth two (2) times a day for 10 days.  Dispense: 20 tablet; Refill: 0    2. Class 2 severe obesity with serious comorbidity and body mass index (BMI) of 36.0 to 36.9 in adult, unspecified obesity type (CMS-HCC)  Briefly discussed weight loss medication alternatives since GLP1a no longer covered by her health plan since the new year. She successfully lost about 50 lbs last year on Wegovy but now off the drug for 2 months and starting to see some rebounding of weight. Discussed phentermine vs phentermine-topiramate vs bupropion-naltrexone. She will check with insurance if any of these are covered.         Preventive services addressed today  We did not review preventive services today    -- Patient verbalized an understanding of today's assessment and recommendations, as well as the purpose of ongoing medications.    No follow-ups on file.    Medication adherence and barriers to the treatment plan have been addressed. Opportunities to optimize healthy behaviors have been discussed. Patient / caregiver voiced understanding.        Subjective:     HPI  Was at a camp over the weekend and 2 days ago noticed an insect bite (unknown what bit her) along left side of neck. Has gotten gradually more tender and red and bigger. Has taken antihistamines and cleaned the area with antibacterial rinse. No fevers or chills, headaches or joint pains.     Also wanted to talk about alternative weight loss medications since she has been forced off Wegovy due to lack of insurance coverage since the new year. Has gained about 10 lbs back since stopping the med.    ROS  As per HPI.    Outpatient Medications Prior to Visit   Medication Sig Dispense Refill    abemaciclib (VERZENIO) 100 mg Tab tablet Take 1 tablet (100 mg total) by mouth Two (2) times a day. Take with or without food. 56 tablet 5    acetaminophen (TYLENOL) 325 MG tablet Take 2 tablets (650 mg total) by mouth every six (6) hours as needed for pain.      calcium carbonate (TUMS) 200 mg calcium (500 mg) chewable tablet Chew 1 tablet (500 mg total) daily.      cetirizine (ZYRTEC) 10 MG tablet Take 1 tablet (10 mg total) by mouth daily. 30 tablet 2    cholecalciferol, vitamin D3-25 mcg, 1,000 unit,, 25 mcg (1,000 unit) capsule Take 1 capsule (25 mcg total) by mouth.      cyanocobalamin, vitamin B-12, 1000 MCG tablet Take 1 tablet (1,000 mcg total) by mouth daily.      docusate sodium (COLACE) 100 MG capsule Take 1 capsule (100 mg total) by mouth nightly as needed (PRN).      escitalopram oxalate (LEXAPRO) 20 MG tablet Take 1 tablet by mouth once daily 90 tablet 3    gabapentin (NEURONTIN) 100 MG capsule Take 1 capsule (100 mg total) by mouth Take as directed. Take 1 to 3 capsules three times daily for neuropathic pain 270  capsule 3    letrozole (FEMARA) 2.5 mg tablet Take 1 tablet (2.5 mg total) by mouth daily. 90 tablet 3    leuprolide (LUPRON) 3.75 mg injection Inject 3.75 mg into the muscle.      omeprazole (PRILOSEC) 20 MG capsule Take 1 capsule by mouth once daily 90 capsule 0    ondansetron (ZOFRAN-ODT) 4 MG disintegrating tablet Take 1 tablet in mouth every 8 hours as needed for nausea 18 tablet 0    rimegepant (NURTEC ODT) 75 mg TbDL Take 1 tablet (75 mg total) by mouth daily as needed.      SUMAtriptan (IMITREX) 100 MG tablet       traZODone (DESYREL) 50 MG tablet TAKE 1/2 TO 1 (ONE-HALF TO ONE) TABLET BY MOUTH NIGHTLY AS NEEDED FOR SLEEP 30 tablet 0    vitamin E-134 mg, 200 UNIT, 134 mg (200 UNIT) capsule Take 1 capsule (134 mg total) by mouth daily.      topiramate (TOPAMAX) 25 MG tablet TAKE 3 TABLETS BY MOUTH ONCE DAILY      WEGOVY 2.4 MG/0.75 ML SUBCUTANEOUS PEN INJECTOR Inject 2.4 mg under the skin every seven (7) days. (Patient not taking: Reported on 10/20/2022) 4 mL 3     Facility-Administered Medications Prior to Visit   Medication Dose Route Frequency Provider Last Rate Last Admin    leuprolide (LUPRON) 3.75 mg injection                The following portions of the patient's history were reviewed and updated as appropriate: allergies, current medications, past medical history, past social history, and problem list.          Objective:       Vital Signs  BP 108/80 (BP Site: L Arm, BP Position: Sitting, BP Cuff Size: Large)  - Pulse 88  - Wt 94.1 kg (207 lb 8 oz)  - LMP 12/15/2019 Comment: luoprone injection - SpO2 96%  - BMI 34.53 kg/m??      Exam  Physical Exam  Constitutional:       Appearance: Normal appearance.   Skin:     Comments: Left anterior neck with 5mm papule at site of bite with 2cm surrounding indurated erythema, mildly tender and warm to touch, no fluctuance   Neurological:      Mental Status: She is alert.

## 2022-10-20 NOTE — Unmapped (Addendum)
See if the following are covered  -Phentermine  -Qsymia combination phentermine-topiramate  -Contrave which is combination of bupropion-naltrexone

## 2022-10-21 MED ORDER — PHENTERMINE 3.75 MG-TOPIRAMATE ER 23 MG CAPSULE,EXT.RELEASE 24HR MPHAS
ORAL_CAPSULE | 0 refills | 0 days | Status: CP
Start: 2022-10-21 — End: ?

## 2022-10-21 MED ORDER — PHENTERMINE 7.5 MG-TOPIRAMATE ER 46 MG CAPSULE,EXT.RELEASE 24HR MPHASE
ORAL_CAPSULE | 0 refills | 0 days | Status: CP
Start: 2022-10-21 — End: ?

## 2022-10-28 NOTE — Unmapped (Signed)
Breast Oncology Return Patient Evaluation  Referring Physician: Lovett Calender, Fnp  [address]  [place],  [place] [address].  PCP: Durenda Hurt, MD  Breast Med/Onc: Adonis Brook, MD    Cancer Team  Surgical Oncology: Jobe Marker, MD  Radiation Oncology: Rayetta Humphrey, MD  Medical Oncology: Adonis Brook, MD  Plastic Surgery: Levan Hurst, MD  Genetics: Audree Camel, MD    Reason for Visit: A 32 y.o. female with breast cancer referred for consultation for recommendations concerning the management of breast cancer.    Assessment/Plan:    Cancer Staging   Malignant neoplasm of right breast in female, estrogen receptor positive (CMS-HCC)  Staging form: Breast, AJCC 8th Edition  - Clinical: Stage IB (cT1c, cN1, cM0, G1, ER+, PR+, HER2-) - Signed by [name], MD on 12/02/2019  - Pathologic stage from 04/20/2020: No Stage Recommended (ypT1c, pN1a, cM0, G2, ER+, PR+, HER2-) - Signed by [name], MD on 04/25/2020     #Stage IB (cT1c, cN1, cM0, G1, ER+, PR+, HER2-)right breast cancer    Neoadjuvant chemotherapy  Completed ddAC followed by DDTaxol on 03/21/20    Locoregional therapy  -s/p Right mastectomy with SLN and prophylactic left mastectomy  -Pathology consistent with residual disease, MD Dareen Piano residual cancer burden class: RCB-III  With 2/3 positive LNs    - ALND completed on 05/07/2020-1/16 lymph nodes involved. Total 3 LNs involved by metastatic adenocarcinoma  -Completed adjuvant RT on 08/06/2020  -Completed tissue expander exchange for permanent implants, liposuction from abdomen and fat grafting to bilateral breasts on 08/13/2021    No evidence for recurrence on exam and patient does not report any symptoms concerning for recurrence.     Adjuvant therapy  Ovarian suppression (lupron) + Letrozole - SOT 08/09/2020  Adjuvant Abemaciclib - SOT 08/29/2020. Dose reduced to 100mg  BID in June 2022 to improve tolerance. She reports tolerating well.   -Continue Abemaciclib 100 mg BID; plan to complete this month May 2024 due to some treatment breaks while on therapy.  However, she has one more 3 week pack of verzenio left she will complete this and then will stop     Staging scans  -Patient had baseline staging scans which are negative for any metastatic disease.   -Repeat scans on March 2022 showed no evidence of metastasis; ( this was completed in the setting of the patient needing reassurance)     #Hot flashes  -Patient endorses hot flashes since initiation of aromatase inhibition therapy; she reports taking vit E which has been helpful.   -Discussed pharmacologic options including oxybutynin, gabapentin, or cross-tapering escitalopram (managed by PCP for anxiety) for venlafaxine, but patient continues opts for no pharmacologic therapy at this time    #Hx of Syncope/Presyncope-Resolved  -Patient has had multiple episodes of presyncope with tunnel vision and one episode of syncope with preceding tunnel vision and lightheadedness all occurring approximately 4 to 5 days after chemo infusions.  No positional component and she is trying to stay well-hydrated.   -Extensive work-up has been unrevealing, including labs, chest CTA, brain MRI, CT head.  TTE prior to start of chemotherapy showed structurally normal heart. EKG was also normal at the time and 2 week Zio-patch w/o evidence of any concerning arrhythmia  -Overall, presentation seems most consistent with vasovagal etiology, unlikely to be cardiogenic. No symptoms since she was done with [facility].  - Cardiology following - followup limited TTE unremarkable  -Resolved    # Bone health  -Baseline bone mineral density  evaluation prior to starting an aromatase inhibitor was normal  -Updated BD dexa 10/30/22 showed some statistically decrease in bone density, however she still has normal bone density and no indication to implement bone modifying therapy.   -Recommend Vit d 1000 units daily, Ca + 400-600 mg daily and weight bearing exercises.       # Supportive care  - Psychosocial: Supportive mother and husband. Follows with [facility] and [facility]  -Heartburn: Daily PPI prescribed  -Hotflashes: as above  -Lymphedema: she notes less lymphedema and less restriction in her right arm since the fat grafting, seeing PT again and this has been effective.   -Diarrhea: Imodium as needed.  No complaints today      DISPO:  --Continue lupron monthly with nurse visits  --Return in about 5-6 months with Dr. Laney Pastor for follow up   -----------------------------------------------------------------------------------------------------------------------------------------------    HPI: Tami Gibson is a 32 y.o. female who is seen for routine follow-up of management of breast cancer.     She reports she had a spider bite and was prescribed doxycycline and has completed course. She reports skin site looks much better.   In the interval she underwent colonoscopy and this was okay.   She reports lymphedema has been well controlled.   Otherwise she reports feeling well.  She denies any new or unusual  bone pain  She continues PT for lymphedema currently has a night time sleeve that she reports has been really helpful.  She denies shortness of breath and no coughing.   She denies vaginal bleeding.   She is planning to go on a cruise in July for  her wedding anniversary. She reports having motion sickness with cruise and request scopolamine patch today; sent to pharmacy.      --Performance status=0    Breast Cancer History  Hematology/Oncology History Overview Note   32 year old female with recently self palpated right breast lump. A targeted right breast ultrasound on 11/15/2019 showed an irregular mass with spiculated margins at the 7:00 position, 6 cm from the nipple, measuring 1.4 x 1.2 x 1.0 cm. There were 2 abnormal level 1 axillary lymph nodes measuring 1.4 x 1.3 cm and 1.2 x 0.9 cm. Additionally, there was a high level 1/level 2 axillary lymph node, measuring 2.1 x 1.3 cm with an effaced fatty hilum.      Malignant neoplasm of right breast in female, estrogen receptor positive (CMS-HCC)   11/09/2019 Initial Diagnosis    Malignant neoplasm of right breast in female, estrogen receptor positive (CMS-HCC)     11/15/2019 Biopsy    Invasive ductal carcinoma  - Nottingham combined histologic grade: 1  ER 91-100%, PR 91-100%, HER2 negative by IHC    Lymph node, right axilla, core biopsy  - Lymph node positive for metastatic carcinoma,      12/02/2019 -  Cancer Staged    Staging form: Breast, AJCC 8th Edition  - Clinical: Stage IB (cT1c, cN1, cM0, G1, ER+, PR+, HER2-) - Signed by [name], MD on 12/02/2019       12/09/2019 - 03/22/2020 Chemotherapy    OP BREAST AC (Dose Dense) q2W X 4 CYCLES, THEN PACLITAXEL (DOSE DENSE) Q2W x 4 CYCLES  DOXOrubicin 60 mg/m2 IV on day 1, cyclophosphamide 600 mg/m2 IV on day 1, every 2 weeks for 4 cycles, then PACLitaxel 175 mg/m2 every 2 weeks for 4 cycles      04/20/2020 Surgery    Breast, right, simple mastectomy  -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 Extracapsular extension is identified      Breast, left, simple mastectomy  -Benign breast tissue with no atypia, in situ or invasive carcinoma identified     04/20/2020 -  Cancer Staged    Staging form: Breast, AJCC 8th Edition  - Pathologic stage from 04/20/2020: No Stage Recommended (ypT1c, pN1a, cM0, G2, ER+, PR+, HER2-) - Signed by [name], MD on 04/25/2020       05/07/2020 Surgery    Lymph nodes, right axillary, dissection  -One of sixteen lymph nodes involved by metastatic adenocarcinoma (1/16)  -Metastatic focus measures 3 mm in greatest dimension  -No extracapsular extension is identified     06/28/2020 - 08/06/2020 Radiation    The total radiation dose will be 5000 cGy at 200 cGy/fraction for a total of 25 fractions, treated once a day 08/13/2020 Endocrine/Hormone Therapy    Letrozole 2.5mg  +Ovarian suppression (monthly lupron)     Malignant neoplasm of right breast in female, estrogen receptor positive (CMS-HCC)   05/07/2020 Surgery    Lymph nodes, right axillary, dissection  -One of sixteen lymph nodes involved by metastatic adenocarcinoma (1/16)  -Metastatic focus measures 3 mm in greatest dimension  -No extracapsular extension is identified     06/13/2020 -  Radiation    Radiation Therapy Treatment Details (Noted on 06/13/2020)  Site: Right Breast - Overlapping sites  Technique: 3D CRT  Goal: No goal specified  Planned Treatment Start Date: No planned start date specified     06/21/2020 -  Radiation    Radiation Therapy Treatment Details (Noted on 06/21/2020)  Site: Right Breast - Overlapping sites  Technique: 3D CRT  Goal: No goal specified  Planned Treatment Start Date: No planned start date specified         Past Medical History  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    [facility]    Gestational hypertension 01/30/2017    end of pregnancy had hypertension, was induced    Headache     migraine occasionally    Hypertension     during week 37 week, pt induced and had C/S due to hypertension    IIH (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/2015    followed by ophthalmologist    Kidney stone     Malignant neoplasm of right breast in female, 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/30/2019    Pyelonephritis 09/2016    Kidney stones    Urinary tract infection     Had 5-6 in Lifetime, esp high school and college age    Varicella     during childhood    Vitamin B 12 deficiency        Reproductive/GYN History  OB History   Gravida Para Term Preterm AB Living   2 2 2  0 0 2   SAB IAB Ectopic Molar Multiple Live Births   0 0 0 0 0 2      # Outcome Date GA Lbr Len/2nd Weight Sex Delivery Anes PTL Lv   2 Term 02/17/19 [redacted]w[redacted]d  3545 g (7 lb 13 oz) F CS-LTranv Spinal, EPI N LIV  Complications: Failure to Progress in First Stage      Name: Jagoda,CARTER RAE      Apgar1: 8  Apgar5: 9   1 Term 01/30/17 [redacted]w[redacted]d  2875 g (6 lb 5.4 oz) F CS-LTranv Spinal, EPI N LIV      Complications: Hypertension, Breech birth      Name: Racicot,OAKLEY      Apgar1: 8  Apgar5: 9      Obstetric Comments   OB-History reviewed by Lucilla Lame RN on 02/22/2019.   Gardasil series completed   Last pap:  2017; NIL   No abn paps   No hx STI's       Surgical History  Past Surgical History:   Procedure Laterality Date    BREAST BIOPSY Right     IDC    CESAREAN SECTION  01/30/2017    breech    CHEMOTHERAPY      IR INSERT PORT AGE GREATER THAN 5 YRS  12/08/2019    IR INSERT PORT AGE GREATER THAN 5 YRS 12/08/2019 Jobe Gibbon, MD IMG VIR H&V [facility]    LUMBAR PUNCTURE DIAGNOSTIC [facility] HISTORICAL RESULT)  01/2015    to relieve ICP    PR BX/REMV,LYMPH NODE,DEEP AXILL Right 04/20/2020    Procedure: BX/EXC LYMPH NODE; OPEN, DEEP AXILRY NODE;  Surgeon: Moss Mc, MD;  Location: MAIN OR [facility];  Service: Surgical Oncology Breast    PR CESAREAN DELIVERY ONLY N/A 01/30/2017    Procedure: CESAREAN DELIVERY ONLY;  Surgeon: Asher Muir, MD;  Location: L&D C-SECTION OR SUITES [facility];  Service: Maternal-Fetal Medicine    PR CESAREAN DELIVERY ONLY N/A 02/17/2019    Procedure: CESAREAN DELIVERY ONLY;  Surgeon: Lonny Prude, MD;  Location: L&D C-SECTION OR SUITES [facility];  Service: Maternal-Fetal Medicine    PR COLONOSCOPY FLX DX W/COLLJ SPEC WHEN PFRMD N/A 10/03/2022    Procedure: COLONOSCOPY, FLEXIBLE, PROXIMAL TO SPLENIC FLEXURE; DIAGNOSTIC, W/WO COLLECTION SPECIMEN BY BRUSH OR WASH;  Surgeon: Dub Mikes, MD;  Location: [facility] GI PROCEDURES [place];  Service: Gastroenterology    PR GRAFTING OF AUTOLOGOUS FAT BY LIPO 50 CC OR LESS Bilateral 04/16/2022    Procedure: GRAFTING OF AUTOLOGOUS FAT HARVESTED BY LIPOSUCTION TECHNIQUE TO TRUNK, BREASTS, SCALP, ARMS, AND/OR LEGS; 50 CC OR LESS INJECTATE;  Surgeon: Arsenio Katz, MD;  Location: MAIN OR [place];  Service: Plastics    PR IMPLNT BIO IMPLNT FOR SOFT TISSUE REINFORCEMENT Bilateral 04/20/2020    Procedure: IMPLANTATION BIOLOGIC IMPLANT(EG, ACELLULAR DERMAL MATRIX) FOR SOFT TISSUE REINFORCEMENT(EG, BREAST, TRUNK);  Surgeon: Arsenio Katz, MD;  Location: MAIN OR [facility];  Service: Plastics    PR INSJ/RPLCMT BREAST IMPLANT SEP DAY MASTECTOMY Right 04/16/2022    Procedure: INSERTION OR REPLACEMENT OF BREAST IMPLANT ON SEPERATE DAY FROM MASTECTOMY;  Surgeon: Arsenio Katz, MD;  Location: MAIN OR [place];  Service: Plastics    PR INTRAOPERATIVE SENTINEL LYMPH NODE ID W DYE INJECTION Right 04/20/2020    Procedure: INTRAOPERATIVE IDENTIFICATION SENTINEL LYMPH NODE(S) INCLUDE INJECTION NON-RADIOACTIVE DYE, WHEN PERFORMED;  Surgeon: Moss Mc, MD;  Location: MAIN OR [place];  Service: Surgical Oncology Breast    PR INTRAOPERATIVE SENTINEL LYMPH NODE ID W DYE INJECTION Right 05/07/2020    Procedure: INTRAOPERATIVE IDENTIFICATION SENTINEL LYMPH NODE(S) INCLUDE INJECTION NON-RADIOACTIVE DYE, WHEN PERFORMED;  Surgeon: Moss Mc, MD;  Location: MAIN OR ;  Service: Surgical Oncology Breast    PR MASTECTOMY, SIMPLE, COMPLETE Bilateral 04/20/2020    Procedure: MASTECTOMY, SIMPLE, COMPLETE;  Surgeon: Moss Mc, MD;  Location: MAIN OR [facility];  Service: Surgical Oncology Breast    PR REMOVE ARMPITS LYMPH NODES COMPLT Right 05/07/2020    Procedure: AXILLARY LYMPHADENECTOMY; COMPLETE;  Surgeon: Moss Mc, MD;  Location: MAIN OR [facility];  Service: Surgical Oncology Breast    PR REPLACEMENT TISSUE EXPANDER W/PERMANENT IMPLANT Bilateral 08/13/2021    Procedure: REPLACEMENT OF TISSUE EXPANDER WITH PERMANENT IMPLANT;  Surgeon: Arsenio Katz, MD;  Location: MAIN OR [facility];  Service: Plastics    PR REVISION OF RECONSTRUCTED BREAST Bilateral 04/16/2022    Procedure: REVISION OF RECONSTRUCTED BREAST;  Surgeon: Arsenio Katz, MD;  Location: MAIN OR [facility];  Service: Plastics    PR TISSUE EXPANDER PLACEMENT BREAST RECONSTRUCTION Bilateral 04/20/2020    Procedure: TISSUE EXPANDER PLACEMENT IN BREAST RECONSTRUCTION, INCLUDING SUBSEQUENT EXPANSION(S);  Surgeon: Arsenio Katz, MD;  Location: MAIN OR [facility];  Service: Plastics    PR UPPER GI ENDOSCOPY,BIOPSY N/A 10/03/2022    Procedure: UGI ENDOSCOPY; WITH BIOPSY, SINGLE OR MULTIPLE;  Surgeon: Dub Mikes, MD;  Location: [facility];  Service: Gastroenterology    TONSILECTOMY, ADENOIDECTOMY, BILATERAL MYRINGOTOMY AND TUBES      WISDOM TOOTH EXTRACTION  2004       Medications    Current Outpatient Medications:     abemaciclib (VERZENIO) 100 mg Tab tablet, Take 1 tablet (100 mg total) by mouth Two (2) times a day. Take with or without food., Disp: 56 tablet, Rfl: 5    acetaminophen (TYLENOL) 325 MG tablet, Take 2 tablets (650 mg total) by mouth every six (6) hours as needed for pain., Disp: , Rfl:     calcium carbonate (TUMS) 200 mg calcium (500 mg) chewable tablet, Chew 1 tablet (500 mg total) daily., Disp: , Rfl:     cetirizine (ZYRTEC) 10 MG tablet, Take 1 tablet (10 mg total) by mouth daily., Disp: 30 tablet, Rfl: 2    cholecalciferol, vitamin D3-25 mcg, 1,000 unit,, 25 mcg (1,000 unit) capsule, Take 1 capsule (25 mcg total) by mouth., Disp: , Rfl:     cyanocobalamin, vitamin B-12, 1000 MCG tablet, Take 1 tablet (1,000 mcg total) by mouth daily., Disp: , Rfl:     docusate sodium (COLACE) 100 MG capsule, Take 1 capsule (100 mg total) by mouth nightly as needed (PRN)., Disp: , Rfl:     doxycycline (VIBRA-TABS) 100 MG tablet, Take 1 tablet (100 mg total) by mouth two (2) times a day for 10 days., Disp: 20 tablet, Rfl: 0    escitalopram oxalate (LEXAPRO) 20 MG tablet, Take 1 tablet by mouth once daily, Disp: 90 tablet, Rfl: 3    gabapentin (NEURONTIN) 100 MG capsule, Take 1 capsule (100 mg total) by mouth Take as directed. Take 1 to 3 capsules three times daily for neuropathic pain, Disp: 270 capsule, Rfl: 3    letrozole (FEMARA) 2.5 mg tablet, Take 1 tablet (2.5 mg total) by mouth daily., Disp: 90 tablet, Rfl: 3    leuprolide (LUPRON) 3.75 mg injection, Inject 3.75 mg into the muscle., Disp: , Rfl:     omeprazole (PRILOSEC) 20 MG capsule, Take 1 capsule by mouth once daily, Disp: 90 capsule, Rfl: 0    ondansetron (ZOFRAN-ODT) 4 MG disintegrating tablet, Take 1 tablet in mouth every 8 hours as needed for nausea, Disp: 18 tablet, Rfl: 0    phentermine-topiramate 3.75-23 mg CM24, Take one capsule PO daily for 14 days. If tolerated, increase to 7.5-46mg  capsule thereafter., Disp: 14 capsule, Rfl:  0    phentermine-topiramate 7.5-46 mg CM24, Start one capsule PO daily after completing 14 days of lower 3.75-23mg  dose, Disp: 90 capsule, Rfl: 0    rimegepant (NURTEC ODT) 75 mg TbDL, Take 1 tablet (75 mg total) by mouth daily as needed., Disp: , Rfl:     SUMAtriptan (IMITREX) 100 MG tablet, , Disp: , Rfl:     traZODone (DESYREL) 50 MG tablet, TAKE 1/2 TO 1 (ONE-HALF TO ONE) TABLET BY MOUTH NIGHTLY AS NEEDED FOR SLEEP, Disp: 30 tablet, Rfl: 0    vitamin E-134 mg, 200 UNIT, 134 mg (200 UNIT) capsule, Take 1 capsule (134 mg total) by mouth daily., Disp: , Rfl:   No current facility-administered medications for this visit.    Facility-Administered Medications Ordered in Other Visits:     leuprolide (LUPRON) 3.75 mg injection, , , ,     Allergies  No Known Allergies    Personal and Social History  Social History     Social History Narrative    PRECONCEPTUAL ASSESSMENT:        Jannis is a 32 y.o. Caucasian female    Caffeine:denies use    Cats: no    Exercise: not active    Diet: balanced    Family hx of of birth defects, chromosomal abnormality, intellectual disability, developmental delay: no.            PARTNER HISTORY:     Domingo Dimes is a 31 y.o. Caucasian female.    Occupation:  Immunologist; [employer]    He has fathered children:  no    He is healthy with no chronic illness    Family history of  birth defects, chromosomal abnormality, intellectual disability, or developmental delay: no.                Family History  Family History   Problem Relation Age of Onset    Cancer Maternal Aunt 45        breast Ca    Hypertension Father     Other Father 51        Benign brain tumor    Hypothyroidism Mother     Cancer Paternal Aunt         Breast Ca dx in 43s    ALS Maternal Aunt     Ovarian cancer Maternal Grandmother 35    No Known Problems Maternal Grandfather     Cancer Paternal Grandmother         Unknown Cancer    No Known Problems Paternal Grandfather     No Known Problems Maternal Aunt     No Known Problems Maternal Aunt     Breast cancer Maternal Cousin         Dx in 71s    No Known Problems Paternal Aunt     No Known Problems Daughter     No Known Problems Daughter     No Known Problems Other     BRCA 1/2 Neg Hx     Colon cancer Neg Hx     Endometrial cancer Neg Hx          Review of Systems: A 12-system review of systems was obtained including: Constitutional, Eyes, ENT, Cardiovascular, Respiratory, GI, GU, Musculoskeletal, Skin, Neurological, Psychiatric, Endocrine, Heme/Lymphatic, and Allergic/Immunologic systems. It is negative or non-contributory to the patient???s management except for the following: See Interval History.    Physical Examination:  Vital Signs: BP 126/85  - Pulse 91  - Temp 36.8 ??C (98.2 ??F) (  Oral)  - Resp 15  - Ht 165.1 cm (5' 5)  - Wt 98.2 kg (216 lb 7.9 oz)  - LMP 12/15/2019 Comment: luoprone injection - SpO2 99%  - BMI 36.03 kg/m??   General:  Healthy-appearing female in no acute distress..  Cardiovasc:  No heaves, regular, no additional sounds. No lower extremity edema.    Respiratory:  Chest clear to percussion and auscultation, unlabored  Gastrointestinal:  Soft, nontender, no hepatomegaly.   Musculoskeletal:  No bony pain or tenderness.   Skin and Subcutaneous Tissues: No significant rash.  Psychiatric: Mood is normal.  No other symptoms.   Neuro:  Alert and oriented.  Gait and coordination normal  Breasts: Bilateral mastectomies with implants in place. Well healed scars.  Heme/Lymphatic/Immunologic: No supraclavicular or cervical adenopathy    I have personally reviewed the following diagnostic studies:    Breast imaging and pathology reviewed. See Results for details

## 2022-10-30 ENCOUNTER — Ambulatory Visit: Admit: 2022-10-30 | Discharge: 2022-10-30 | Payer: PRIVATE HEALTH INSURANCE

## 2022-10-30 DIAGNOSIS — Z170 Estrogen receptor positive status [ER+]: Principal | ICD-10-CM

## 2022-10-30 DIAGNOSIS — T753XXS Motion sickness, sequela: Principal | ICD-10-CM

## 2022-10-30 DIAGNOSIS — C50911 Malignant neoplasm of unspecified site of right female breast: Principal | ICD-10-CM

## 2022-10-30 LAB — CBC W/ AUTO DIFF
BASOPHILS ABSOLUTE COUNT: 0 10*9/L (ref 0.0–0.1)
BASOPHILS RELATIVE PERCENT: 0.8 %
EOSINOPHILS ABSOLUTE COUNT: 0.1 10*9/L (ref 0.0–0.5)
EOSINOPHILS RELATIVE PERCENT: 1.4 %
HEMATOCRIT: 33.3 % — ABNORMAL LOW (ref 34.0–44.0)
HEMOGLOBIN: 11.3 g/dL (ref 11.3–14.9)
LYMPHOCYTES ABSOLUTE COUNT: 1.3 10*9/L (ref 1.1–3.6)
LYMPHOCYTES RELATIVE PERCENT: 32.3 %
MEAN CORPUSCULAR HEMOGLOBIN CONC: 34 g/dL (ref 32.0–36.0)
MEAN CORPUSCULAR HEMOGLOBIN: 28.9 pg (ref 25.9–32.4)
MEAN CORPUSCULAR VOLUME: 84.9 fL (ref 77.6–95.7)
MEAN PLATELET VOLUME: 7.2 fL (ref 6.8–10.7)
MONOCYTES ABSOLUTE COUNT: 0.3 10*9/L (ref 0.3–0.8)
MONOCYTES RELATIVE PERCENT: 7.3 %
NEUTROPHILS ABSOLUTE COUNT: 2.3 10*9/L (ref 1.8–7.8)
NEUTROPHILS RELATIVE PERCENT: 58.2 %
NUCLEATED RED BLOOD CELLS: 0 /100{WBCs} (ref <=4)
PLATELET COUNT: 141 10*9/L — ABNORMAL LOW (ref 150–450)
RED BLOOD CELL COUNT: 3.93 10*12/L — ABNORMAL LOW (ref 3.95–5.13)
RED CELL DISTRIBUTION WIDTH: 14.1 % (ref 12.2–15.2)
WBC ADJUSTED: 4 10*9/L (ref 3.6–11.2)

## 2022-10-30 LAB — COMPREHENSIVE METABOLIC PANEL
ALBUMIN: 3.2 g/dL — ABNORMAL LOW (ref 3.4–5.0)
ALKALINE PHOSPHATASE: 102 U/L (ref 46–116)
ALT (SGPT): 9 U/L — ABNORMAL LOW (ref 10–49)
ANION GAP: 5 mmol/L (ref 5–14)
AST (SGOT): 15 U/L (ref <=34)
BILIRUBIN TOTAL: 0.3 mg/dL (ref 0.3–1.2)
BLOOD UREA NITROGEN: 18 mg/dL (ref 9–23)
BUN / CREAT RATIO: 18
CALCIUM: 8.8 mg/dL (ref 8.7–10.4)
CHLORIDE: 109 mmol/L — ABNORMAL HIGH (ref 98–107)
CO2: 27.8 mmol/L (ref 20.0–31.0)
CREATININE: 0.99 mg/dL
EGFR CKD-EPI (2021) FEMALE: 77 mL/min/{1.73_m2} (ref >=60)
GLUCOSE RANDOM: 100 mg/dL (ref 70–179)
POTASSIUM: 4.2 mmol/L (ref 3.4–4.8)
PROTEIN TOTAL: 6.5 g/dL (ref 5.7–8.2)
SODIUM: 142 mmol/L (ref 135–145)

## 2022-10-30 MED ORDER — SCOPOLAMINE 1 MG OVER 3 DAYS TRANSDERMAL PATCH
MEDICATED_PATCH | TRANSDERMAL | 0 refills | 9 days | Status: CP
Start: 2022-10-30 — End: 2023-10-30

## 2022-10-31 MED ORDER — PHENTERMINE 15 MG CAPSULE
ORAL_CAPSULE | Freq: Every morning | ORAL | 0 refills | 30 days | Status: CP
Start: 2022-10-31 — End: ?

## 2022-10-31 NOTE — Unmapped (Signed)
I spoke with patient Tami Gibson to confirm appointments on the following date(s):     [place]

## 2022-11-01 DIAGNOSIS — K219 Gastro-esophageal reflux disease without esophagitis: Principal | ICD-10-CM

## 2022-11-01 MED ORDER — OMEPRAZOLE 20 MG CAPSULE,DELAYED RELEASE
ORAL_CAPSULE | Freq: Every day | ORAL | 0 refills | 0 days
Start: 2022-11-01 — End: ?

## 2022-11-02 DIAGNOSIS — F419 Anxiety disorder, unspecified: Principal | ICD-10-CM

## 2022-11-02 MED ORDER — TRAZODONE 50 MG TABLET
ORAL_TABLET | 0 refills | 0 days
Start: 2022-11-02 — End: ?

## 2022-11-03 MED ORDER — OMEPRAZOLE 20 MG CAPSULE,DELAYED RELEASE
ORAL_CAPSULE | Freq: Every day | ORAL | 0 refills | 90 days | Status: CP
Start: 2022-11-03 — End: ?

## 2022-11-03 MED ORDER — TRAZODONE 50 MG TABLET
ORAL_TABLET | 11 refills | 0 days | Status: CP
Start: 2022-11-03 — End: ?

## 2022-11-03 NOTE — Unmapped (Signed)
Patient is requesting the following refill  Requested Prescriptions     Pending Prescriptions Disp Refills    traZODone (DESYREL) 50 MG tablet [Pharmacy Med Name: traZODone HCl 50 MG Oral Tablet] 30 tablet 0     Sig: TAKE 1/2 TO 1 (ONE-HALF TO ONE) TABLET BY MOUTH NIGHTLY AS NEEDED FOR SLEEP       Order pended. Please advise. Thanks    Last OV: 10/20/2022   Next OV: Visit date not found

## 2022-11-03 NOTE — Unmapped (Signed)
Pt is requesting refill    Most recent clinic visit: 06/25/2022  Next clinic visit:  03/25/2023

## 2022-11-12 ENCOUNTER — Institutional Professional Consult (permissible substitution): Admit: 2022-11-12 | Discharge: 2022-11-13 | Payer: PRIVATE HEALTH INSURANCE

## 2022-11-12 DIAGNOSIS — Z170 Estrogen receptor positive status [ER+]: Principal | ICD-10-CM

## 2022-11-12 DIAGNOSIS — C50911 Malignant neoplasm of unspecified site of right female breast: Principal | ICD-10-CM

## 2022-11-12 MED ADMIN — leuprolide (LUPRON) injection 3.75 mg: 3.75 mg | INTRAMUSCULAR | @ 13:00:00 | Stop: 2022-11-12

## 2022-11-12 MED ADMIN — leuprolide (LUPRON) 3.75 mg injection: INTRAMUSCULAR | @ 13:00:00 | Stop: 2022-11-12

## 2022-11-12 NOTE — Unmapped (Signed)
Specialty Medication(s): Verzenio    Tami Gibson has been dis-enrolled from the [facility] specialty pharmacy services due to therapy completion - expected therapy completion date: 11/2022 .    Additional information provided to the patient: no    Rollen Sox, [facility]  [facility] Specialty Pharmacist

## 2022-11-12 NOTE — Unmapped (Signed)
Patient received Lupron 3.75 mg IM in LUOQ dorsogluteal area at patient request.. Tolerated well. BandAid Applied. Patient education provided.

## 2022-12-01 ENCOUNTER — Telehealth
Admit: 2022-12-01 | Discharge: 2022-12-02 | Payer: PRIVATE HEALTH INSURANCE | Attending: Internal Medicine | Primary: Internal Medicine

## 2022-12-01 DIAGNOSIS — G43009 Migraine without aura, not intractable, without status migrainosus: Principal | ICD-10-CM

## 2022-12-01 DIAGNOSIS — E661 Drug-induced obesity: Principal | ICD-10-CM

## 2022-12-01 DIAGNOSIS — Z6836 Body mass index (BMI) 36.0-36.9, adult: Principal | ICD-10-CM

## 2022-12-01 MED ORDER — TOPIRAMATE 50 MG TABLET
ORAL_TABLET | Freq: Two times a day (BID) | ORAL | 11 refills | 30 days | Status: CP
Start: 2022-12-01 — End: ?

## 2022-12-01 MED ORDER — PHENTERMINE 15 MG CAPSULE
ORAL_CAPSULE | Freq: Every morning | ORAL | 0 refills | 30 days | Status: CP
Start: 2022-12-01 — End: ?

## 2022-12-04 DIAGNOSIS — Z170 Estrogen receptor positive status [ER+]: Principal | ICD-10-CM

## 2022-12-04 DIAGNOSIS — C50911 Malignant neoplasm of unspecified site of right female breast: Principal | ICD-10-CM

## 2022-12-05 MED ORDER — ZEPBOUND 2.5 MG/0.5 ML SUBCUTANEOUS PEN INJECTOR
SUBCUTANEOUS | 0 refills | 0 days | Status: CP
Start: 2022-12-05 — End: ?

## 2022-12-10 ENCOUNTER — Institutional Professional Consult (permissible substitution): Admit: 2022-12-10 | Discharge: 2022-12-11 | Payer: PRIVATE HEALTH INSURANCE

## 2022-12-10 DIAGNOSIS — C50911 Malignant neoplasm of unspecified site of right female breast: Principal | ICD-10-CM

## 2022-12-10 DIAGNOSIS — Z170 Estrogen receptor positive status [ER+]: Principal | ICD-10-CM

## 2022-12-15 MED ORDER — PHENTERMINE 37.5 MG TABLET
ORAL_TABLET | Freq: Every day | ORAL | 0 refills | 30 days | Status: CP
Start: 2022-12-15 — End: ?

## 2022-12-31 ENCOUNTER — Ambulatory Visit
Admit: 2022-12-31 | Discharge: 2022-12-31 | Payer: PRIVATE HEALTH INSURANCE | Attending: Radiation Oncology | Primary: Radiation Oncology

## 2023-01-02 ENCOUNTER — Ambulatory Visit: Admit: 2023-01-02 | Discharge: 2023-01-03 | Payer: PRIVATE HEALTH INSURANCE

## 2023-01-02 DIAGNOSIS — Z170 Estrogen receptor positive status [ER+]: Principal | ICD-10-CM

## 2023-01-02 DIAGNOSIS — C50911 Malignant neoplasm of unspecified site of right female breast: Principal | ICD-10-CM

## 2023-01-07 ENCOUNTER — Ambulatory Visit: Admit: 2023-01-07 | Payer: PRIVATE HEALTH INSURANCE

## 2023-01-15 ENCOUNTER — Ambulatory Visit
Admit: 2023-01-15 | Discharge: 2023-01-16 | Payer: PRIVATE HEALTH INSURANCE | Attending: Internal Medicine | Primary: Internal Medicine

## 2023-01-15 DIAGNOSIS — Z6836 Body mass index (BMI) 36.0-36.9, adult: Principal | ICD-10-CM

## 2023-01-19 ENCOUNTER — Ambulatory Visit: Admit: 2023-01-19 | Discharge: 2023-01-20 | Payer: PRIVATE HEALTH INSURANCE

## 2023-01-19 DIAGNOSIS — Z9889 Other specified postprocedural states: Principal | ICD-10-CM

## 2023-01-19 DIAGNOSIS — Z170 Estrogen receptor positive status [ER+]: Principal | ICD-10-CM

## 2023-01-19 DIAGNOSIS — C50911 Malignant neoplasm of unspecified site of right female breast: Principal | ICD-10-CM

## 2023-01-21 DIAGNOSIS — C50911 Malignant neoplasm of unspecified site of right female breast: Principal | ICD-10-CM

## 2023-01-21 DIAGNOSIS — Z170 Estrogen receptor positive status [ER+]: Principal | ICD-10-CM

## 2023-01-31 DIAGNOSIS — K219 Gastro-esophageal reflux disease without esophagitis: Principal | ICD-10-CM

## 2023-01-31 MED ORDER — OMEPRAZOLE 20 MG CAPSULE,DELAYED RELEASE
ORAL_CAPSULE | Freq: Every day | ORAL | 0 refills | 0 days
Start: 2023-01-31 — End: ?

## 2023-02-03 MED ORDER — OMEPRAZOLE 20 MG CAPSULE,DELAYED RELEASE
ORAL_CAPSULE | Freq: Every day | ORAL | 0 refills | 90 days | Status: CP
Start: 2023-02-03 — End: ?

## 2023-02-05 ENCOUNTER — Institutional Professional Consult (permissible substitution): Admit: 2023-02-05 | Discharge: 2023-02-06 | Payer: PRIVATE HEALTH INSURANCE

## 2023-02-05 DIAGNOSIS — C50911 Malignant neoplasm of unspecified site of right female breast: Principal | ICD-10-CM

## 2023-02-05 DIAGNOSIS — Z170 Estrogen receptor positive status [ER+]: Principal | ICD-10-CM

## 2023-02-10 ENCOUNTER — Ambulatory Visit
Admit: 2023-02-10 | Discharge: 2023-02-11 | Payer: PRIVATE HEALTH INSURANCE | Attending: Internal Medicine | Primary: Internal Medicine

## 2023-02-10 DIAGNOSIS — Z6836 Body mass index (BMI) 36.0-36.9, adult: Principal | ICD-10-CM

## 2023-02-10 DIAGNOSIS — R059 Cough, unspecified type: Principal | ICD-10-CM

## 2023-02-10 MED ORDER — ALBUTEROL SULFATE HFA 90 MCG/ACTUATION AEROSOL INHALER
Freq: Four times a day (QID) | RESPIRATORY_TRACT | 0 refills | 0 days | Status: CP | PRN
Start: 2023-02-10 — End: 2024-02-10

## 2023-02-10 MED ORDER — AEROCHAMBER MV SPACER
0 refills | 0 days | Status: CP
Start: 2023-02-10 — End: ?

## 2023-02-10 MED ORDER — SEMAGLUTIDE 1 MG/DOSE (4 MG/3 ML) SUBCUTANEOUS PEN INJECTOR
SUBCUTANEOUS | 3 refills | 28 days | Status: CP
Start: 2023-02-10 — End: ?

## 2023-02-17 MED ORDER — PEN NEEDLE, DIABETIC 32 GAUGE X 5/32" (4 MM)
SUBCUTANEOUS | 3 refills | 210 days | Status: CP
Start: 2023-02-17 — End: ?

## 2023-03-06 ENCOUNTER — Institutional Professional Consult (permissible substitution): Admit: 2023-03-06 | Discharge: 2023-03-07 | Payer: PRIVATE HEALTH INSURANCE

## 2023-03-06 DIAGNOSIS — Z170 Estrogen receptor positive status [ER+]: Principal | ICD-10-CM

## 2023-03-06 DIAGNOSIS — C50911 Malignant neoplasm of unspecified site of right female breast: Principal | ICD-10-CM

## 2023-03-25 ENCOUNTER — Ambulatory Visit: Admit: 2023-03-25 | Discharge: 2023-03-26 | Payer: PRIVATE HEALTH INSURANCE

## 2023-03-25 DIAGNOSIS — Z170 Estrogen receptor positive status [ER+]: Principal | ICD-10-CM

## 2023-03-25 DIAGNOSIS — C50911 Malignant neoplasm of unspecified site of right female breast: Principal | ICD-10-CM

## 2023-03-27 MED ORDER — LETROZOLE 2.5 MG TABLET
ORAL_TABLET | Freq: Every day | ORAL | 3 refills | 90 days | Status: CP
Start: 2023-03-27 — End: ?

## 2023-04-06 ENCOUNTER — Ambulatory Visit: Admit: 2023-04-06 | Payer: PRIVATE HEALTH INSURANCE

## 2023-04-17 ENCOUNTER — Ambulatory Visit: Admit: 2023-04-17 | Discharge: 2023-04-18 | Payer: PRIVATE HEALTH INSURANCE

## 2023-04-17 MED ORDER — OXYCODONE 5 MG TABLET
ORAL_TABLET | ORAL | 0 refills | 4 days | Status: CP | PRN
Start: 2023-04-17 — End: 2023-04-22

## 2023-04-20 ENCOUNTER — Institutional Professional Consult (permissible substitution): Admit: 2023-04-20 | Discharge: 2023-04-21 | Payer: PRIVATE HEALTH INSURANCE

## 2023-04-20 DIAGNOSIS — Z029 Encounter for administrative examinations, unspecified: Principal | ICD-10-CM

## 2023-04-20 MED ORDER — ASPIRIN 325 MG CAPSULE
ORAL_CAPSULE | Freq: Every day | ORAL | 0 refills | 14 days
Start: 2023-04-20 — End: 2023-05-04

## 2023-04-20 MED ORDER — DICLOFENAC SODIUM 50 MG TABLET,DELAYED RELEASE
ORAL_TABLET | Freq: Every day | ORAL | 0 refills | 14 days
Start: 2023-04-20 — End: 2023-05-04

## 2023-04-20 MED ORDER — HYDROCODONE 5 MG-ACETAMINOPHEN 325 MG TABLET
ORAL_TABLET | 0 refills | 0 days
Start: 2023-04-20 — End: ?

## 2023-04-20 MED ORDER — ERGOCALCIFEROL (VITAMIN D2) 1,250 MCG (50,000 UNIT) CAPSULE
ORAL_CAPSULE | ORAL | 0 refills | 56 days
Start: 2023-04-20 — End: 2023-06-09

## 2023-04-20 MED ORDER — ONDANSETRON HCL 4 MG TABLET
ORAL_TABLET | Freq: Four times a day (QID) | ORAL | 0 refills | 5 days | PRN
Start: 2023-04-20 — End: 2023-04-25

## 2023-04-20 MED ORDER — GABAPENTIN 100 MG CAPSULE
ORAL_CAPSULE | Freq: Every evening | ORAL | 0 refills | 10 days
Start: 2023-04-20 — End: 2023-04-30

## 2023-04-21 ENCOUNTER — Encounter: Admit: 2023-04-21 | Discharge: 2023-04-21 | Payer: PRIVATE HEALTH INSURANCE

## 2023-04-21 ENCOUNTER — Ambulatory Visit: Admit: 2023-04-21 | Discharge: 2023-04-21 | Payer: PRIVATE HEALTH INSURANCE

## 2023-04-21 MED ORDER — ONDANSETRON HCL 4 MG TABLET
ORAL_TABLET | Freq: Four times a day (QID) | ORAL | 0 refills | 5 days | Status: CP | PRN
Start: 2023-04-21 — End: 2023-04-26
  Filled 2023-04-21: qty 18, 5d supply, fill #0

## 2023-04-21 MED ORDER — ASPIRIN 325 MG TABLET
ORAL_TABLET | Freq: Every day | ORAL | 0 refills | 14 days | Status: CP
Start: 2023-04-21 — End: 2023-05-05
  Filled 2023-04-21: qty 20, 3d supply, fill #0

## 2023-04-21 MED ORDER — HYDROCODONE 5 MG-ACETAMINOPHEN 325 MG TABLET
ORAL_TABLET | 0 refills | 0 days | Status: CP
Start: 2023-04-21 — End: ?
  Filled 2023-04-21: qty 14, 14d supply, fill #0

## 2023-04-21 MED ORDER — DICLOFENAC SODIUM 50 MG TABLET,DELAYED RELEASE
ORAL_TABLET | Freq: Every day | ORAL | 0 refills | 14 days | Status: CP
Start: 2023-04-21 — End: 2023-05-05
  Filled 2023-04-21: qty 14, 14d supply, fill #0

## 2023-04-21 MED ORDER — ERGOCALCIFEROL (VITAMIN D2) 1,250 MCG (50,000 UNIT) CAPSULE
ORAL_CAPSULE | ORAL | 0 refills | 56 days | Status: CP
Start: 2023-04-21 — End: 2023-06-16
  Filled 2023-04-21: qty 8, 56d supply, fill #0

## 2023-04-21 MED ORDER — GABAPENTIN 100 MG CAPSULE
ORAL_CAPSULE | Freq: Every evening | ORAL | 0 refills | 10 days | Status: CP
Start: 2023-04-21 — End: 2023-05-01
  Filled 2023-04-21: qty 10, 10d supply, fill #0

## 2023-04-27 DIAGNOSIS — S82891A Other fracture of right lower leg, initial encounter for closed fracture: Principal | ICD-10-CM

## 2023-04-27 MED ORDER — HYDROCODONE 5 MG-ACETAMINOPHEN 325 MG TABLET
ORAL_TABLET | 0 refills | 0 days | Status: CP
Start: 2023-04-27 — End: ?

## 2023-04-27 MED ORDER — CYCLOBENZAPRINE 5 MG TABLET
ORAL_TABLET | Freq: Three times a day (TID) | ORAL | 0 refills | 5 days | Status: CP | PRN
Start: 2023-04-27 — End: ?

## 2023-05-02 DIAGNOSIS — K219 Gastro-esophageal reflux disease without esophagitis: Principal | ICD-10-CM

## 2023-05-02 MED ORDER — OMEPRAZOLE 20 MG CAPSULE,DELAYED RELEASE
ORAL_CAPSULE | Freq: Every day | ORAL | 0 refills | 0 days
Start: 2023-05-02 — End: ?

## 2023-05-03 DIAGNOSIS — Z170 Estrogen receptor positive status [ER+]: Principal | ICD-10-CM

## 2023-05-03 DIAGNOSIS — C50911 Malignant neoplasm of unspecified site of right female breast: Principal | ICD-10-CM

## 2023-05-04 DIAGNOSIS — Z170 Estrogen receptor positive status [ER+]: Principal | ICD-10-CM

## 2023-05-04 DIAGNOSIS — C50911 Malignant neoplasm of unspecified site of right female breast: Principal | ICD-10-CM

## 2023-05-04 MED ORDER — OMEPRAZOLE 20 MG CAPSULE,DELAYED RELEASE
ORAL_CAPSULE | Freq: Every day | ORAL | 0 refills | 90 days | Status: CP
Start: 2023-05-04 — End: ?

## 2023-05-05 ENCOUNTER — Institutional Professional Consult (permissible substitution): Admit: 2023-05-05 | Discharge: 2023-05-06 | Payer: PRIVATE HEALTH INSURANCE

## 2023-05-05 ENCOUNTER — Ambulatory Visit: Admit: 2023-05-05 | Discharge: 2023-05-06 | Payer: PRIVATE HEALTH INSURANCE

## 2023-05-05 DIAGNOSIS — C50911 Malignant neoplasm of unspecified site of right female breast: Principal | ICD-10-CM

## 2023-05-05 DIAGNOSIS — Z170 Estrogen receptor positive status [ER+]: Principal | ICD-10-CM

## 2023-05-05 MED ORDER — HYDROCODONE 5 MG-ACETAMINOPHEN 325 MG TABLET
ORAL_TABLET | Freq: Four times a day (QID) | ORAL | 0 refills | 3 days | Status: CP | PRN
Start: 2023-05-05 — End: ?

## 2023-05-05 MED ORDER — CYCLOBENZAPRINE 5 MG TABLET
ORAL_TABLET | Freq: Three times a day (TID) | ORAL | 0 refills | 5 days | Status: CP | PRN
Start: 2023-05-05 — End: ?

## 2023-05-08 DIAGNOSIS — Z853 Personal history of malignant neoplasm of breast: Principal | ICD-10-CM

## 2023-05-19 ENCOUNTER — Telehealth: Admit: 2023-05-19 | Discharge: 2023-05-20 | Payer: PRIVATE HEALTH INSURANCE

## 2023-05-19 DIAGNOSIS — R519 Nonintractable headache, unspecified chronicity pattern, unspecified headache type: Principal | ICD-10-CM

## 2023-05-19 DIAGNOSIS — J069 Acute upper respiratory infection, unspecified: Principal | ICD-10-CM

## 2023-05-19 DIAGNOSIS — R059 Cough, unspecified type: Principal | ICD-10-CM

## 2023-05-19 MED ORDER — FLUTICASONE PROPIONATE 50 MCG/ACTUATION NASAL SPRAY,SUSPENSION
Freq: Every day | NASAL | 0 refills | 120.00 days | Status: CP
Start: 2023-05-19 — End: 2024-05-18

## 2023-05-19 MED ORDER — PROMETHAZINE-DM 6.25 MG-15 MG/5 ML ORAL SYRUP
Freq: Every evening | ORAL | 0 refills | 10.00 days | Status: CP | PRN
Start: 2023-05-19 — End: 2023-05-26

## 2023-05-19 MED ORDER — BENZONATATE 200 MG CAPSULE
ORAL_CAPSULE | Freq: Three times a day (TID) | ORAL | 0 refills | 7.00 days | Status: CP | PRN
Start: 2023-05-19 — End: 2023-05-26

## 2023-05-19 MED ORDER — AMOXICILLIN 875 MG TABLET
ORAL_TABLET | Freq: Two times a day (BID) | ORAL | 0 refills | 7.00 days | Status: CP
Start: 2023-05-19 — End: ?

## 2023-05-24 DIAGNOSIS — K219 Gastro-esophageal reflux disease without esophagitis: Principal | ICD-10-CM

## 2023-05-24 MED ORDER — OMEPRAZOLE 20 MG CAPSULE,DELAYED RELEASE
ORAL_CAPSULE | Freq: Every day | ORAL | 0 refills | 0.00 days
Start: 2023-05-24 — End: ?

## 2023-05-25 ENCOUNTER — Telehealth
Admit: 2023-05-25 | Discharge: 2023-05-26 | Payer: PRIVATE HEALTH INSURANCE | Attending: Internal Medicine | Primary: Internal Medicine

## 2023-05-25 DIAGNOSIS — Z170 Estrogen receptor positive status [ER+]: Principal | ICD-10-CM

## 2023-05-25 DIAGNOSIS — C50911 Malignant neoplasm of unspecified site of right female breast: Principal | ICD-10-CM

## 2023-05-25 DIAGNOSIS — R059 Cough, unspecified type: Principal | ICD-10-CM

## 2023-05-25 MED ORDER — OMEPRAZOLE 20 MG CAPSULE,DELAYED RELEASE
ORAL_CAPSULE | Freq: Every day | ORAL | 0 refills | 90.00 days | Status: CP
Start: 2023-05-25 — End: ?

## 2023-05-25 MED ORDER — AZITHROMYCIN 250 MG TABLET
ORAL_TABLET | 0 refills | 5.00 days | Status: CP
Start: 2023-05-25 — End: 2023-05-30

## 2023-05-28 DIAGNOSIS — C50911 Malignant neoplasm of unspecified site of right female breast: Principal | ICD-10-CM

## 2023-05-28 DIAGNOSIS — Z170 Estrogen receptor positive status [ER+]: Principal | ICD-10-CM

## 2023-06-02 ENCOUNTER — Institutional Professional Consult (permissible substitution): Admit: 2023-06-02 | Discharge: 2023-06-02 | Payer: PRIVATE HEALTH INSURANCE

## 2023-06-02 ENCOUNTER — Ambulatory Visit: Admit: 2023-06-02 | Discharge: 2023-06-02 | Payer: PRIVATE HEALTH INSURANCE

## 2023-06-02 DIAGNOSIS — C50911 Malignant neoplasm of unspecified site of right female breast: Principal | ICD-10-CM

## 2023-06-02 DIAGNOSIS — Z09 Encounter for follow-up examination after completed treatment for conditions other than malignant neoplasm: Principal | ICD-10-CM

## 2023-06-02 DIAGNOSIS — Z170 Estrogen receptor positive status [ER+]: Principal | ICD-10-CM

## 2023-06-05 DIAGNOSIS — Z6836 Body mass index (BMI) 36.0-36.9, adult: Principal | ICD-10-CM

## 2023-06-05 DIAGNOSIS — E66812 Class 2 severe obesity with serious comorbidity and body mass index (BMI) of 36.0 to 36.9 in adult, unspecified obesity type (CMS-HCC): Principal | ICD-10-CM

## 2023-06-05 MED ORDER — SEMAGLUTIDE (WEIGHT LOSS) 1 MG/0.5 ML SUBCUTANEOUS PEN INJECTOR
SUBCUTANEOUS | 0 refills | 0.00 days | Status: CP
Start: 2023-06-05 — End: ?

## 2023-06-05 MED ORDER — SEMAGLUTIDE (WEIGHT LOSS) 0.25 MG/0.5 ML SUBCUTANEOUS PEN INJECTOR
SUBCUTANEOUS | 0 refills | 0.00 days | Status: CP
Start: 2023-06-05 — End: ?

## 2023-06-05 MED ORDER — SEMAGLUTIDE (WEIGHT LOSS) 0.5 MG/0.5 ML SUBCUTANEOUS PEN INJECTOR
SUBCUTANEOUS | 0 refills | 0.00 days | Status: CP
Start: 2023-06-05 — End: ?

## 2023-06-08 MED ORDER — ZEPBOUND 2.5 MG/0.5 ML SUBCUTANEOUS PEN INJECTOR
SUBCUTANEOUS | 0 refills | 28.00 days | Status: CP
Start: 2023-06-08 — End: 2023-06-30

## 2023-06-11 ENCOUNTER — Inpatient Hospital Stay: Admit: 2023-06-11 | Discharge: 2023-06-12 | Payer: PRIVATE HEALTH INSURANCE

## 2023-06-11 ENCOUNTER — Ambulatory Visit: Admit: 2023-06-11 | Discharge: 2023-06-12 | Payer: PRIVATE HEALTH INSURANCE

## 2023-06-11 DIAGNOSIS — Z170 Estrogen receptor positive status [ER+]: Principal | ICD-10-CM

## 2023-06-11 DIAGNOSIS — C50911 Malignant neoplasm of unspecified site of right female breast: Principal | ICD-10-CM

## 2023-06-12 DIAGNOSIS — Z170 Estrogen receptor positive status [ER+]: Principal | ICD-10-CM

## 2023-06-12 DIAGNOSIS — C50911 Malignant neoplasm of unspecified site of right female breast: Principal | ICD-10-CM

## 2023-06-23 MED ORDER — ZEPBOUND 2.5 MG/0.5 ML SUBCUTANEOUS PEN INJECTOR
SUBCUTANEOUS | 0 refills | 28.00 days | Status: CP
Start: 2023-06-23 — End: 2023-06-23

## 2023-06-24 DIAGNOSIS — Z170 Estrogen receptor positive status [ER+]: Principal | ICD-10-CM

## 2023-06-24 DIAGNOSIS — C50911 Malignant neoplasm of unspecified site of right female breast: Principal | ICD-10-CM

## 2023-06-24 MED ORDER — ZEPBOUND 2.5 MG/0.5 ML SUBCUTANEOUS PEN INJECTOR
SUBCUTANEOUS | 1 refills | 70.00 days | Status: CP
Start: 2023-06-24 — End: ?

## 2023-06-26 ENCOUNTER — Ambulatory Visit
Admit: 2023-06-26 | Discharge: 2023-06-27 | Payer: PRIVATE HEALTH INSURANCE | Attending: Adult Health | Primary: Adult Health

## 2023-06-26 DIAGNOSIS — R234 Changes in skin texture: Principal | ICD-10-CM

## 2023-06-26 DIAGNOSIS — Z170 Estrogen receptor positive status [ER+]: Principal | ICD-10-CM

## 2023-06-26 DIAGNOSIS — C50911 Malignant neoplasm of unspecified site of right female breast: Principal | ICD-10-CM

## 2023-06-26 MED ORDER — TIRZEPATIDE (WEIGHT LOSS) 2.5 MG/0.5 ML SUBCUTANEOUS PEN INJECTOR
SUBCUTANEOUS | 3 refills | 28.00 days | Status: CP
Start: 2023-06-26 — End: 2023-06-26

## 2023-06-30 ENCOUNTER — Ambulatory Visit: Admit: 2023-06-30 | Discharge: 2023-07-01 | Payer: PRIVATE HEALTH INSURANCE

## 2023-06-30 DIAGNOSIS — Z170 Estrogen receptor positive status [ER+]: Principal | ICD-10-CM

## 2023-06-30 DIAGNOSIS — C50911 Malignant neoplasm of unspecified site of right female breast: Principal | ICD-10-CM

## 2023-07-03 MED ORDER — SEMAGLUTIDE (WEIGHT LOSS) 0.25 MG/0.5 ML SUBCUTANEOUS PEN INJECTOR
SUBCUTANEOUS | 0 refills | 0.00 days | Status: CP
Start: 2023-07-03 — End: ?

## 2023-07-06 MED ORDER — ZEPBOUND 5 MG/0.5 ML SUBCUTANEOUS PEN INJECTOR
SUBCUTANEOUS | 0 refills | 0.00 days | Status: CP
Start: 2023-07-06 — End: 2023-07-28

## 2023-07-07 DIAGNOSIS — C50911 Malignant neoplasm of unspecified site of right female breast: Principal | ICD-10-CM

## 2023-07-07 DIAGNOSIS — Z170 Estrogen receptor positive status [ER+]: Principal | ICD-10-CM

## 2023-07-09 DIAGNOSIS — G43019 Migraine without aura, intractable, without status migrainosus: Principal | ICD-10-CM

## 2023-07-09 DIAGNOSIS — G08 Intracranial and intraspinal phlebitis and thrombophlebitis: Principal | ICD-10-CM

## 2023-07-09 DIAGNOSIS — H93A3 Pulsatile tinnitus, bilateral: Principal | ICD-10-CM

## 2023-07-10 DIAGNOSIS — Z170 Estrogen receptor positive status [ER+]: Principal | ICD-10-CM

## 2023-07-10 DIAGNOSIS — C50911 Malignant neoplasm of unspecified site of right female breast: Principal | ICD-10-CM

## 2023-07-11 DIAGNOSIS — F419 Anxiety disorder, unspecified: Principal | ICD-10-CM

## 2023-07-11 MED ORDER — ESCITALOPRAM 20 MG TABLET
ORAL_TABLET | Freq: Every day | ORAL | 0 refills | 0.00 days
Start: 2023-07-11 — End: ?

## 2023-07-13 DIAGNOSIS — C50911 Malignant neoplasm of unspecified site of right female breast: Principal | ICD-10-CM

## 2023-07-13 DIAGNOSIS — Z170 Estrogen receptor positive status [ER+]: Principal | ICD-10-CM

## 2023-07-13 MED ORDER — ESCITALOPRAM 20 MG TABLET
ORAL_TABLET | Freq: Every day | ORAL | 3 refills | 90.00 days | Status: CP
Start: 2023-07-13 — End: ?

## 2023-07-16 ENCOUNTER — Inpatient Hospital Stay: Admit: 2023-07-16 | Discharge: 2023-07-17 | Payer: PRIVATE HEALTH INSURANCE

## 2023-07-16 ENCOUNTER — Inpatient Hospital Stay: Admit: 2023-07-16 | Discharge: 2023-07-16 | Payer: PRIVATE HEALTH INSURANCE

## 2023-07-16 ENCOUNTER — Ambulatory Visit: Admit: 2023-07-16 | Discharge: 2023-07-16 | Payer: PRIVATE HEALTH INSURANCE

## 2023-07-16 ENCOUNTER — Ambulatory Visit: Admit: 2023-07-16 | Discharge: 2023-07-17 | Payer: PRIVATE HEALTH INSURANCE

## 2023-07-16 DIAGNOSIS — S93401A Sprain of unspecified ligament of right ankle, initial encounter: Principal | ICD-10-CM

## 2023-07-16 DIAGNOSIS — Z09 Encounter for follow-up examination after completed treatment for conditions other than malignant neoplasm: Principal | ICD-10-CM

## 2023-07-16 DIAGNOSIS — S82891D Other fracture of right lower leg, subsequent encounter for closed fracture with routine healing: Principal | ICD-10-CM

## 2023-07-17 DIAGNOSIS — Z170 Estrogen receptor positive status [ER+]: Principal | ICD-10-CM

## 2023-07-17 DIAGNOSIS — C50911 Malignant neoplasm of unspecified site of right female breast: Principal | ICD-10-CM

## 2023-07-27 ENCOUNTER — Ambulatory Visit: Admit: 2023-07-27 | Discharge: 2023-07-28 | Payer: PRIVATE HEALTH INSURANCE

## 2023-07-27 DIAGNOSIS — C50911 Malignant neoplasm of unspecified site of right female breast: Principal | ICD-10-CM

## 2023-07-27 DIAGNOSIS — Z170 Estrogen receptor positive status [ER+]: Principal | ICD-10-CM

## 2023-07-28 DIAGNOSIS — Z170 Estrogen receptor positive status [ER+]: Principal | ICD-10-CM

## 2023-07-28 DIAGNOSIS — C50911 Malignant neoplasm of unspecified site of right female breast: Principal | ICD-10-CM

## 2023-08-03 MED ORDER — ZEPBOUND 7.5 MG/0.5 ML SUBCUTANEOUS PEN INJECTOR
SUBCUTANEOUS | 0 refills | 0.00 days | Status: CP
Start: 2023-08-03 — End: 2023-08-25

## 2023-08-09 DIAGNOSIS — C50911 Malignant neoplasm of unspecified site of right female breast: Principal | ICD-10-CM

## 2023-08-09 DIAGNOSIS — Z170 Estrogen receptor positive status [ER+]: Principal | ICD-10-CM

## 2023-08-09 MED ORDER — ONDANSETRON 4 MG DISINTEGRATING TABLET
ORAL_TABLET | 0 refills | 0.00 days
Start: 2023-08-09 — End: ?

## 2023-08-10 DIAGNOSIS — Z170 Estrogen receptor positive status [ER+]: Principal | ICD-10-CM

## 2023-08-10 DIAGNOSIS — C50911 Malignant neoplasm of unspecified site of right female breast: Principal | ICD-10-CM

## 2023-08-10 MED ORDER — ONDANSETRON 4 MG DISINTEGRATING TABLET
ORAL_TABLET | 0 refills | 0.00 days | Status: CP
Start: 2023-08-10 — End: ?

## 2023-08-11 ENCOUNTER — Inpatient Hospital Stay: Admit: 2023-08-11 | Discharge: 2023-08-12 | Payer: PRIVATE HEALTH INSURANCE

## 2023-08-26 ENCOUNTER — Ambulatory Visit: Admit: 2023-08-26 | Discharge: 2023-08-26 | Payer: PRIVATE HEALTH INSURANCE

## 2023-08-26 DIAGNOSIS — Z170 Estrogen receptor positive status [ER+]: Principal | ICD-10-CM

## 2023-08-26 DIAGNOSIS — C50911 Malignant neoplasm of unspecified site of right female breast: Principal | ICD-10-CM

## 2023-08-27 ENCOUNTER — Inpatient Hospital Stay: Admit: 2023-08-27 | Discharge: 2023-08-28

## 2023-08-27 ENCOUNTER — Ambulatory Visit: Admit: 2023-08-27 | Discharge: 2023-08-28

## 2023-08-27 ENCOUNTER — Ambulatory Visit: Admit: 2023-08-27 | Discharge: 2023-08-28 | Attending: Internal Medicine | Primary: Internal Medicine

## 2023-08-27 MED ORDER — PHENTERMINE 37.5 MG TABLET
ORAL_TABLET | Freq: Every day | ORAL | 3 refills | 30 days | Status: CP
Start: 2023-08-27 — End: ?

## 2023-09-07 DIAGNOSIS — C50911 Malignant neoplasm of unspecified site of right female breast: Principal | ICD-10-CM

## 2023-09-07 DIAGNOSIS — Z170 Estrogen receptor positive status [ER+]: Principal | ICD-10-CM

## 2023-09-23 ENCOUNTER — Ambulatory Visit: Admit: 2023-09-23 | Discharge: 2023-09-24 | Payer: PRIVATE HEALTH INSURANCE

## 2023-09-23 DIAGNOSIS — C50911 Malignant neoplasm of unspecified site of right female breast: Principal | ICD-10-CM

## 2023-09-23 DIAGNOSIS — Z170 Estrogen receptor positive status [ER+]: Principal | ICD-10-CM

## 2023-09-24 ENCOUNTER — Inpatient Hospital Stay: Admit: 2023-09-24 | Discharge: 2023-09-24 | Payer: PRIVATE HEALTH INSURANCE

## 2023-10-07 ENCOUNTER — Ambulatory Visit: Admit: 2023-10-07 | Discharge: 2023-10-08 | Payer: PRIVATE HEALTH INSURANCE

## 2023-10-07 DIAGNOSIS — R300 Dysuria: Principal | ICD-10-CM

## 2023-10-08 DIAGNOSIS — R300 Dysuria: Principal | ICD-10-CM

## 2023-10-08 MED ORDER — NITROFURANTOIN MONOHYDRATE/MACROCRYSTALS 100 MG CAPSULE
ORAL_CAPSULE | Freq: Two times a day (BID) | ORAL | 0 refills | 5.00000 days | Status: CP
Start: 2023-10-08 — End: 2023-10-13

## 2023-10-12 MED ORDER — NITROFURANTOIN MONOHYDRATE/MACROCRYSTALS 100 MG CAPSULE
ORAL_CAPSULE | Freq: Two times a day (BID) | ORAL | 0 refills | 5.00000 days | Status: CP
Start: 2023-10-12 — End: 2023-10-17

## 2023-10-16 DIAGNOSIS — Z170 Estrogen receptor positive status [ER+]: Principal | ICD-10-CM

## 2023-10-16 DIAGNOSIS — C50911 Malignant neoplasm of unspecified site of right female breast: Principal | ICD-10-CM

## 2023-10-21 ENCOUNTER — Ambulatory Visit: Admit: 2023-10-21 | Discharge: 2023-10-22 | Payer: PRIVATE HEALTH INSURANCE

## 2023-10-21 DIAGNOSIS — C50911 Malignant neoplasm of unspecified site of right female breast: Principal | ICD-10-CM

## 2023-10-21 DIAGNOSIS — Z170 Estrogen receptor positive status [ER+]: Principal | ICD-10-CM

## 2023-10-27 DIAGNOSIS — C50911 Malignant neoplasm of unspecified site of right female breast: Principal | ICD-10-CM

## 2023-10-27 DIAGNOSIS — Z170 Estrogen receptor positive status [ER+]: Principal | ICD-10-CM

## 2023-11-06 DIAGNOSIS — K219 Gastro-esophageal reflux disease without esophagitis: Principal | ICD-10-CM

## 2023-11-06 MED ORDER — OMEPRAZOLE 20 MG CAPSULE,DELAYED RELEASE
ORAL_CAPSULE | Freq: Every day | ORAL | 0 refills | 0.00000 days
Start: 2023-11-06 — End: ?

## 2023-11-09 DIAGNOSIS — C50911 Malignant neoplasm of unspecified site of right female breast: Principal | ICD-10-CM

## 2023-11-09 DIAGNOSIS — Z170 Estrogen receptor positive status [ER+]: Principal | ICD-10-CM

## 2023-11-09 MED ORDER — OMEPRAZOLE 20 MG CAPSULE,DELAYED RELEASE
ORAL_CAPSULE | Freq: Every day | ORAL | 0 refills | 90.00000 days | Status: CP
Start: 2023-11-09 — End: ?

## 2023-11-11 ENCOUNTER — Ambulatory Visit: Admit: 2023-11-11 | Discharge: 2023-11-12 | Payer: PRIVATE HEALTH INSURANCE

## 2023-11-11 DIAGNOSIS — C50911 Malignant neoplasm of unspecified site of right female breast: Principal | ICD-10-CM

## 2023-11-11 DIAGNOSIS — Z170 Estrogen receptor positive status [ER+]: Principal | ICD-10-CM

## 2023-11-19 DIAGNOSIS — Z170 Estrogen receptor positive status [ER+]: Principal | ICD-10-CM

## 2023-11-19 DIAGNOSIS — C50911 Malignant neoplasm of unspecified site of right female breast: Principal | ICD-10-CM

## 2023-11-23 ENCOUNTER — Ambulatory Visit: Admit: 2023-11-23 | Discharge: 2023-11-24 | Payer: PRIVATE HEALTH INSURANCE

## 2023-11-23 DIAGNOSIS — Z170 Estrogen receptor positive status [ER+]: Principal | ICD-10-CM

## 2023-11-23 DIAGNOSIS — C50911 Malignant neoplasm of unspecified site of right female breast: Principal | ICD-10-CM

## 2023-11-23 DIAGNOSIS — E66812 Class 2 severe obesity with serious comorbidity and body mass index (BMI) of 36.0 to 36.9 in adult, unspecified obesity type (CMS-HCC): Principal | ICD-10-CM

## 2023-11-23 DIAGNOSIS — Z6836 Body mass index (BMI) 36.0-36.9, adult: Principal | ICD-10-CM

## 2023-11-26 ENCOUNTER — Ambulatory Visit: Admit: 2023-11-26 | Discharge: 2023-11-27 | Payer: PRIVATE HEALTH INSURANCE

## 2023-11-26 ENCOUNTER — Inpatient Hospital Stay: Admit: 2023-11-26 | Discharge: 2023-11-27 | Payer: PRIVATE HEALTH INSURANCE

## 2023-11-26 DIAGNOSIS — S82891D Other fracture of right lower leg, subsequent encounter for closed fracture with routine healing: Principal | ICD-10-CM

## 2023-12-04 DIAGNOSIS — Z170 Estrogen receptor positive status [ER+]: Principal | ICD-10-CM

## 2023-12-04 DIAGNOSIS — C50911 Malignant neoplasm of unspecified site of right female breast: Principal | ICD-10-CM

## 2023-12-08 DIAGNOSIS — C50911 Malignant neoplasm of unspecified site of right female breast: Principal | ICD-10-CM

## 2023-12-08 DIAGNOSIS — Z170 Estrogen receptor positive status [ER+]: Principal | ICD-10-CM

## 2023-12-14 DIAGNOSIS — Z09 Encounter for follow-up examination after completed treatment for conditions other than malignant neoplasm: Principal | ICD-10-CM

## 2023-12-14 DIAGNOSIS — S82891D Other fracture of right lower leg, subsequent encounter for closed fracture with routine healing: Principal | ICD-10-CM

## 2023-12-14 DIAGNOSIS — S93401A Sprain of unspecified ligament of right ankle, initial encounter: Principal | ICD-10-CM

## 2023-12-14 DIAGNOSIS — R040 Epistaxis: Principal | ICD-10-CM

## 2023-12-15 DIAGNOSIS — I10 Essential (primary) hypertension: Principal | ICD-10-CM

## 2023-12-15 DIAGNOSIS — F419 Anxiety disorder, unspecified: Principal | ICD-10-CM

## 2023-12-15 MED ORDER — LISINOPRIL 10 MG TABLET
ORAL_TABLET | Freq: Every day | ORAL | 11 refills | 30.00000 days | Status: CP
Start: 2023-12-15 — End: 2024-12-14

## 2023-12-22 ENCOUNTER — Ambulatory Visit: Admit: 2023-12-22 | Discharge: 2023-12-23 | Payer: PRIVATE HEALTH INSURANCE

## 2023-12-31 ENCOUNTER — Encounter
Admit: 2023-12-31 | Discharge: 2023-12-31 | Payer: PRIVATE HEALTH INSURANCE | Attending: Internal Medicine | Primary: Internal Medicine

## 2023-12-31 DIAGNOSIS — E6609 Other obesity due to excess calories: Principal | ICD-10-CM

## 2023-12-31 DIAGNOSIS — D509 Iron deficiency anemia, unspecified: Principal | ICD-10-CM

## 2023-12-31 DIAGNOSIS — Z6838 Body mass index (BMI) 38.0-38.9, adult: Principal | ICD-10-CM

## 2023-12-31 DIAGNOSIS — I10 Essential (primary) hypertension: Principal | ICD-10-CM

## 2023-12-31 DIAGNOSIS — Z136 Encounter for screening for cardiovascular disorders: Principal | ICD-10-CM

## 2023-12-31 DIAGNOSIS — E66812 Class 2 obesity due to excess calories without serious comorbidity with body mass index (BMI) of 38.0 to 38.9 in adult: Principal | ICD-10-CM

## 2023-12-31 DIAGNOSIS — Z131 Encounter for screening for diabetes mellitus: Principal | ICD-10-CM

## 2023-12-31 DIAGNOSIS — F419 Anxiety disorder, unspecified: Principal | ICD-10-CM

## 2023-12-31 MED ORDER — TRAZODONE 50 MG TABLET
ORAL_TABLET | Freq: Every evening | ORAL | 11 refills | 30.00000 days | Status: CP | PRN
Start: 2023-12-31 — End: ?

## 2023-12-31 MED ORDER — METFORMIN ER 500 MG TABLET,EXTENDED RELEASE 24 HR
ORAL_TABLET | Freq: Two times a day (BID) | ORAL | 11 refills | 30.00000 days | Status: CP
Start: 2023-12-31 — End: 2024-12-30

## 2024-01-06 DIAGNOSIS — E66812 Class 2 obesity due to excess calories without serious comorbidity with body mass index (BMI) of 38.0 to 38.9 in adult: Principal | ICD-10-CM

## 2024-01-06 DIAGNOSIS — E6609 Other obesity due to excess calories: Principal | ICD-10-CM

## 2024-01-06 DIAGNOSIS — Z6838 Body mass index (BMI) 38.0-38.9, adult: Principal | ICD-10-CM

## 2024-01-15 DIAGNOSIS — K219 Gastro-esophageal reflux disease without esophagitis: Principal | ICD-10-CM

## 2024-01-15 DIAGNOSIS — Z170 Estrogen receptor positive status [ER+]: Principal | ICD-10-CM

## 2024-01-15 DIAGNOSIS — C50911 Malignant neoplasm of unspecified site of right female breast: Principal | ICD-10-CM

## 2024-01-15 DIAGNOSIS — E6609 Other obesity due to excess calories: Principal | ICD-10-CM

## 2024-01-15 DIAGNOSIS — Z6838 Body mass index (BMI) 38.0-38.9, adult: Principal | ICD-10-CM

## 2024-01-15 DIAGNOSIS — E66812 Class 2 obesity due to excess calories without serious comorbidity with body mass index (BMI) of 38.0 to 38.9 in adult: Principal | ICD-10-CM

## 2024-01-15 DIAGNOSIS — G4700 Insomnia, unspecified: Principal | ICD-10-CM

## 2024-01-15 DIAGNOSIS — G932 Benign intracranial hypertension: Principal | ICD-10-CM

## 2024-01-15 DIAGNOSIS — R400 Somnolence: Principal | ICD-10-CM

## 2024-01-15 MED ORDER — METFORMIN ER 500 MG TABLET,EXTENDED RELEASE 24 HR
ORAL_TABLET | Freq: Every day | ORAL | 0 refills | 90.00000 days | Status: CP
Start: 2024-01-15 — End: 2025-01-14

## 2024-01-20 ENCOUNTER — Ambulatory Visit: Admit: 2024-01-20 | Discharge: 2024-01-20 | Payer: PRIVATE HEALTH INSURANCE

## 2024-01-20 DIAGNOSIS — Z170 Estrogen receptor positive status [ER+]: Principal | ICD-10-CM

## 2024-01-20 DIAGNOSIS — C50911 Malignant neoplasm of unspecified site of right female breast: Principal | ICD-10-CM

## 2024-02-08 DIAGNOSIS — K219 Gastro-esophageal reflux disease without esophagitis: Principal | ICD-10-CM

## 2024-02-08 MED ORDER — OMEPRAZOLE 20 MG CAPSULE,DELAYED RELEASE
ORAL_CAPSULE | Freq: Every day | ORAL | 0 refills | 90.00000 days | Status: CP
Start: 2024-02-08 — End: ?

## 2024-02-12 DIAGNOSIS — C50911 Malignant neoplasm of unspecified site of right female breast: Principal | ICD-10-CM

## 2024-02-12 DIAGNOSIS — Z170 Estrogen receptor positive status [ER+]: Principal | ICD-10-CM

## 2024-02-16 DIAGNOSIS — C50911 Malignant neoplasm of unspecified site of right female breast: Principal | ICD-10-CM

## 2024-02-16 DIAGNOSIS — Z170 Estrogen receptor positive status [ER+]: Principal | ICD-10-CM

## 2024-02-19 ENCOUNTER — Ambulatory Visit: Admit: 2024-02-19 | Discharge: 2024-02-20 | Payer: PRIVATE HEALTH INSURANCE

## 2024-02-19 DIAGNOSIS — Z170 Estrogen receptor positive status [ER+]: Principal | ICD-10-CM

## 2024-02-19 DIAGNOSIS — C50911 Malignant neoplasm of unspecified site of right female breast: Principal | ICD-10-CM

## 2024-03-01 ENCOUNTER — Ambulatory Visit: Admit: 2024-03-01 | Discharge: 2024-03-02 | Payer: PRIVATE HEALTH INSURANCE

## 2024-03-01 DIAGNOSIS — C50911 Malignant neoplasm of unspecified site of right female breast: Principal | ICD-10-CM

## 2024-03-01 DIAGNOSIS — G4700 Insomnia, unspecified: Principal | ICD-10-CM

## 2024-03-01 DIAGNOSIS — Z170 Estrogen receptor positive status [ER+]: Principal | ICD-10-CM

## 2024-03-01 MED ORDER — ONDANSETRON 4 MG DISINTEGRATING TABLET
ORAL_TABLET | Freq: Three times a day (TID) | 0 refills | 10.00000 days | Status: CP | PRN
Start: 2024-03-01 — End: 2024-03-11

## 2024-03-01 MED ORDER — ZEPBOUND 2.5 MG/0.5 ML SUBCUTANEOUS PEN INJECTOR
SUBCUTANEOUS | 0 refills | 28.00000 days | Status: CP
Start: 2024-03-01 — End: ?

## 2024-03-03 ENCOUNTER — Encounter: Admit: 2024-03-03 | Discharge: 2024-03-04 | Payer: PRIVATE HEALTH INSURANCE

## 2024-03-03 DIAGNOSIS — Z170 Estrogen receptor positive status [ER+]: Principal | ICD-10-CM

## 2024-03-03 DIAGNOSIS — C50911 Malignant neoplasm of unspecified site of right female breast: Principal | ICD-10-CM

## 2024-03-03 DIAGNOSIS — Z9889 Other specified postprocedural states: Principal | ICD-10-CM

## 2024-03-03 DIAGNOSIS — Z923 Personal history of irradiation: Principal | ICD-10-CM

## 2024-03-03 DIAGNOSIS — Z9221 Personal history of antineoplastic chemotherapy: Principal | ICD-10-CM

## 2024-03-04 DIAGNOSIS — Z170 Estrogen receptor positive status [ER+]: Principal | ICD-10-CM

## 2024-03-04 DIAGNOSIS — C50911 Malignant neoplasm of unspecified site of right female breast: Principal | ICD-10-CM

## 2024-03-07 DIAGNOSIS — Z170 Estrogen receptor positive status [ER+]: Principal | ICD-10-CM

## 2024-03-07 DIAGNOSIS — C50911 Malignant neoplasm of unspecified site of right female breast: Principal | ICD-10-CM

## 2024-03-10 ENCOUNTER — Ambulatory Visit: Admit: 2024-03-10 | Discharge: 2024-03-11 | Payer: PRIVATE HEALTH INSURANCE

## 2024-03-10 ENCOUNTER — Inpatient Hospital Stay: Admit: 2024-03-10 | Discharge: 2024-03-11 | Payer: PRIVATE HEALTH INSURANCE

## 2024-03-10 DIAGNOSIS — S82891D Other fracture of right lower leg, subsequent encounter for closed fracture with routine healing: Principal | ICD-10-CM

## 2024-03-10 DIAGNOSIS — S82851P Displaced trimalleolar fracture of right lower leg, subsequent encounter for closed fracture with malunion: Principal | ICD-10-CM

## 2024-03-11 DIAGNOSIS — Z170 Estrogen receptor positive status [ER+]: Principal | ICD-10-CM

## 2024-03-11 DIAGNOSIS — C50911 Malignant neoplasm of unspecified site of right female breast: Principal | ICD-10-CM

## 2024-03-17 ENCOUNTER — Inpatient Hospital Stay: Admit: 2024-03-17 | Discharge: 2024-03-17 | Payer: PRIVATE HEALTH INSURANCE

## 2024-03-18 ENCOUNTER — Ambulatory Visit: Admit: 2024-03-18 | Discharge: 2024-03-19 | Payer: PRIVATE HEALTH INSURANCE

## 2024-03-18 DIAGNOSIS — C50911 Malignant neoplasm of unspecified site of right female breast: Principal | ICD-10-CM

## 2024-03-18 DIAGNOSIS — Z170 Estrogen receptor positive status [ER+]: Principal | ICD-10-CM

## 2024-03-25 MED ORDER — ZEPBOUND 2.5 MG/0.5 ML SUBCUTANEOUS SOLUTION
SUBCUTANEOUS | 0 refills | 0.00000 days | Status: CP
Start: 2024-03-25 — End: ?

## 2024-03-29 DIAGNOSIS — Z170 Estrogen receptor positive status [ER+]: Principal | ICD-10-CM

## 2024-03-29 DIAGNOSIS — C50911 Malignant neoplasm of unspecified site of right female breast: Principal | ICD-10-CM

## 2024-03-29 MED ORDER — ZEPBOUND 2.5 MG/0.5 ML SUBCUTANEOUS PEN INJECTOR
SUBCUTANEOUS | 0 refills | 28.00000 days | Status: CP
Start: 2024-03-29 — End: ?

## 2024-03-31 DIAGNOSIS — Z170 Estrogen receptor positive status [ER+]: Principal | ICD-10-CM

## 2024-03-31 DIAGNOSIS — C50911 Malignant neoplasm of unspecified site of right female breast: Principal | ICD-10-CM

## 2024-03-31 MED ORDER — ZEPBOUND 2.5 MG/0.5 ML SUBCUTANEOUS SOLUTION
SUBCUTANEOUS | 0 refills | 0.00000 days | Status: CP
Start: 2024-03-31 — End: ?

## 2024-04-10 DIAGNOSIS — C50911 Malignant neoplasm of unspecified site of right female breast: Principal | ICD-10-CM

## 2024-04-10 DIAGNOSIS — Z170 Estrogen receptor positive status [ER+]: Principal | ICD-10-CM

## 2024-04-11 MED ORDER — BENZONATATE 200 MG CAPSULE
ORAL_CAPSULE | Freq: Three times a day (TID) | ORAL | 0 refills | 10.00000 days | Status: CP | PRN
Start: 2024-04-11 — End: 2024-04-21

## 2024-04-16 DIAGNOSIS — C50911 Malignant neoplasm of unspecified site of right female breast: Principal | ICD-10-CM

## 2024-04-16 DIAGNOSIS — Z170 Estrogen receptor positive status [ER+]: Principal | ICD-10-CM

## 2024-04-16 MED ORDER — LETROZOLE 2.5 MG TABLET
ORAL_TABLET | Freq: Every day | ORAL | 0 refills | 0.00000 days
Start: 2024-04-16 — End: ?

## 2024-04-18 DIAGNOSIS — C50911 Malignant neoplasm of unspecified site of right female breast: Principal | ICD-10-CM

## 2024-04-18 DIAGNOSIS — Z170 Estrogen receptor positive status [ER+]: Principal | ICD-10-CM

## 2024-04-18 MED ORDER — LETROZOLE 2.5 MG TABLET
ORAL_TABLET | Freq: Every day | ORAL | 3 refills | 90.00000 days | Status: CP
Start: 2024-04-18 — End: ?

## 2024-04-19 ENCOUNTER — Inpatient Hospital Stay: Admit: 2024-04-19 | Discharge: 2024-04-19 | Payer: PRIVATE HEALTH INSURANCE

## 2024-04-19 ENCOUNTER — Ambulatory Visit: Admit: 2024-04-19 | Discharge: 2024-04-19 | Payer: PRIVATE HEALTH INSURANCE

## 2024-04-19 DIAGNOSIS — C50911 Malignant neoplasm of unspecified site of right female breast: Principal | ICD-10-CM

## 2024-04-19 DIAGNOSIS — Z170 Estrogen receptor positive status [ER+]: Principal | ICD-10-CM

## 2024-04-19 DIAGNOSIS — I10 Essential (primary) hypertension: Principal | ICD-10-CM

## 2024-04-19 DIAGNOSIS — R059 Cough, unspecified type: Principal | ICD-10-CM

## 2024-04-19 MED ORDER — AZITHROMYCIN 250 MG TABLET
ORAL_TABLET | ORAL | 0 refills | 5.00000 days | Status: CP
Start: 2024-04-19 — End: 2024-04-24

## 2024-04-19 MED ORDER — LOSARTAN 25 MG TABLET
ORAL_TABLET | Freq: Every day | ORAL | 3 refills | 90.00000 days | Status: CP
Start: 2024-04-19 — End: 2025-04-19

## 2024-04-19 MED ORDER — FLUTICASONE 100 MCG-SALMETEROL 50 MCG/DOSE BLISTR POWDR FOR INHALATION
Freq: Two times a day (BID) | RESPIRATORY_TRACT | 0 refills | 0.00000 days | Status: CP
Start: 2024-04-19 — End: 2025-04-19

## 2024-04-20 MED ORDER — ZEPBOUND 2.5 MG/0.5 ML SUBCUTANEOUS SOLUTION
0 refills | 0.00000 days
Start: 2024-04-20 — End: ?

## 2024-04-20 MED ORDER — ZEPBOUND 5 MG/0.5 ML SUBCUTANEOUS SOLUTION
SUBCUTANEOUS | 0 refills | 0.00000 days | Status: CP
Start: 2024-04-20 — End: ?

## 2024-04-21 MED ORDER — METHYLPREDNISOLONE 4 MG TABLETS IN A DOSE PACK
Freq: Every day | ORAL | 0 refills | 1.00000 days | Status: CP
Start: 2024-04-21 — End: 2025-04-21

## 2024-04-21 MED ORDER — ZEPBOUND 2.5 MG/0.5 ML SUBCUTANEOUS SOLUTION
0 refills | 0.00000 days
Start: 2024-04-21 — End: ?

## 2024-04-22 DIAGNOSIS — E661 Drug-induced obesity: Principal | ICD-10-CM

## 2024-04-22 DIAGNOSIS — I10 Essential (primary) hypertension: Principal | ICD-10-CM

## 2024-04-22 DIAGNOSIS — Z6838 Body mass index (BMI) 38.0-38.9, adult: Principal | ICD-10-CM

## 2024-04-22 DIAGNOSIS — K219 Gastro-esophageal reflux disease without esophagitis: Principal | ICD-10-CM

## 2024-04-22 DIAGNOSIS — E66812 Class 2 drug-induced obesity without serious comorbidity with body mass index (BMI) of 38.0 to 38.9 in adult: Principal | ICD-10-CM

## 2024-04-22 MED ORDER — ONDANSETRON HCL 4 MG TABLET
ORAL_TABLET | Freq: Three times a day (TID) | ORAL | 0 refills | 10.00000 days | Status: CP | PRN
Start: 2024-04-22 — End: 2024-04-29

## 2024-05-01 DIAGNOSIS — C50911 Malignant neoplasm of unspecified site of right female breast: Principal | ICD-10-CM

## 2024-05-01 DIAGNOSIS — Z170 Estrogen receptor positive status [ER+]: Principal | ICD-10-CM

## 2024-05-06 DIAGNOSIS — K219 Gastro-esophageal reflux disease without esophagitis: Principal | ICD-10-CM

## 2024-05-06 MED ORDER — OMEPRAZOLE 20 MG CAPSULE,DELAYED RELEASE
ORAL_CAPSULE | Freq: Every day | ORAL | 0 refills | 0.00000 days
Start: 2024-05-06 — End: ?

## 2024-05-09 DIAGNOSIS — Z170 Estrogen receptor positive status [ER+]: Principal | ICD-10-CM

## 2024-05-09 DIAGNOSIS — C50911 Malignant neoplasm of unspecified site of right female breast: Principal | ICD-10-CM

## 2024-05-09 MED ORDER — OMEPRAZOLE 20 MG CAPSULE,DELAYED RELEASE
ORAL_CAPSULE | Freq: Every day | ORAL | 0 refills | 90.00000 days | Status: CP
Start: 2024-05-09 — End: ?

## 2024-05-11 ENCOUNTER — Ambulatory Visit: Admit: 2024-05-11 | Discharge: 2024-05-12 | Payer: PRIVATE HEALTH INSURANCE

## 2024-05-11 DIAGNOSIS — Z79811 Long term (current) use of aromatase inhibitors: Principal | ICD-10-CM

## 2024-05-11 DIAGNOSIS — Z170 Estrogen receptor positive status [ER+]: Principal | ICD-10-CM

## 2024-05-11 DIAGNOSIS — C50911 Malignant neoplasm of unspecified site of right female breast: Principal | ICD-10-CM

## 2024-05-11 MED ORDER — ONDANSETRON 4 MG DISINTEGRATING TABLET
ORAL_TABLET | Freq: Two times a day (BID) | ORAL | 2 refills | 15.00000 days | Status: CP | PRN
Start: 2024-05-11 — End: 2025-05-11

## 2024-05-13 MED ORDER — ZEPBOUND 5 MG/0.5 ML SUBCUTANEOUS SOLUTION
0 refills | 0.00000 days
Start: 2024-05-13 — End: ?

## 2024-05-15 MED ORDER — FLUTICASONE 100 MCG-SALMETEROL 50 MCG/DOSE BLISTR POWDR FOR INHALATION
0 refills | 0.00000 days
Start: 2024-05-15 — End: ?

## 2024-05-16 DIAGNOSIS — C50911 Malignant neoplasm of unspecified site of right female breast: Principal | ICD-10-CM

## 2024-05-16 DIAGNOSIS — Z170 Estrogen receptor positive status [ER+]: Principal | ICD-10-CM

## 2024-05-16 MED ORDER — ZEPBOUND 7.5 MG/0.5 ML SUBCUTANEOUS SOLUTION
SUBCUTANEOUS | 0 refills | 0.00000 days | Status: CP
Start: 2024-05-16 — End: 2024-05-16

## 2024-05-16 MED ORDER — FLUTICASONE 100 MCG-SALMETEROL 50 MCG/DOSE BLISTR POWDR FOR INHALATION
RESPIRATORY_TRACT | 0 refills | 0.00000 days | Status: CP
Start: 2024-05-16 — End: ?

## 2024-05-17 ENCOUNTER — Ambulatory Visit: Admit: 2024-05-17 | Discharge: 2024-05-18 | Payer: MEDICARE

## 2024-05-17 DIAGNOSIS — C50911 Malignant neoplasm of unspecified site of right female breast: Principal | ICD-10-CM

## 2024-05-17 DIAGNOSIS — Z170 Estrogen receptor positive status [ER+]: Principal | ICD-10-CM

## 2024-05-17 MED ORDER — ZEPBOUND 5 MG/0.5 ML SUBCUTANEOUS SOLUTION
0 refills | 0.00000 days
Start: 2024-05-17 — End: ?

## 2024-06-09 MED ORDER — ZEPBOUND 7.5 MG/0.5 ML SUBCUTANEOUS SOLUTION
0 refills | 0.00000 days
Start: 2024-06-09 — End: ?

## 2024-06-11 DIAGNOSIS — C50911 Malignant neoplasm of unspecified site of right female breast: Principal | ICD-10-CM

## 2024-06-11 DIAGNOSIS — Z170 Estrogen receptor positive status [ER+]: Principal | ICD-10-CM

## 2024-06-13 DIAGNOSIS — Z170 Estrogen receptor positive status [ER+]: Principal | ICD-10-CM

## 2024-06-13 DIAGNOSIS — C50911 Malignant neoplasm of unspecified site of right female breast: Principal | ICD-10-CM

## 2024-06-13 MED ORDER — ZEPBOUND 10 MG/0.5 ML SUBCUTANEOUS SOLUTION
SUBCUTANEOUS | 0 refills | 0.00000 days | Status: CP
Start: 2024-06-13 — End: ?

## 2024-06-13 MED ORDER — ZEPBOUND 7.5 MG/0.5 ML SUBCUTANEOUS SOLUTION
0 refills | 0.00000 days
Start: 2024-06-13 — End: ?

## 2024-06-14 ENCOUNTER — Ambulatory Visit: Admit: 2024-06-14 | Discharge: 2024-06-15 | Payer: PRIVATE HEALTH INSURANCE

## 2024-06-14 DIAGNOSIS — C50911 Malignant neoplasm of unspecified site of right female breast: Principal | ICD-10-CM

## 2024-06-14 DIAGNOSIS — Z170 Estrogen receptor positive status [ER+]: Principal | ICD-10-CM

## 2024-06-19 DIAGNOSIS — C50911 Malignant neoplasm of unspecified site of right female breast: Principal | ICD-10-CM

## 2024-06-19 DIAGNOSIS — Z170 Estrogen receptor positive status [ER+]: Principal | ICD-10-CM

## 2024-06-24 ENCOUNTER — Other Ambulatory Visit: Admit: 2024-06-24 | Discharge: 2024-06-25 | Payer: PRIVATE HEALTH INSURANCE

## 2024-06-27 ENCOUNTER — Emergency Department
Admit: 2024-06-27 | Discharge: 2024-06-27 | Disposition: A | Discharge status: Home / Self Care | Payer: PRIVATE HEALTH INSURANCE

## 2024-06-27 DIAGNOSIS — Z170 Estrogen receptor positive status [ER+]: Secondary | ICD-10-CM

## 2024-06-27 DIAGNOSIS — C50911 Malignant neoplasm of unspecified site of right female breast: Principal | ICD-10-CM

## 2024-06-27 DIAGNOSIS — E876 Hypokalemia: Secondary | ICD-10-CM

## 2024-06-27 DIAGNOSIS — E860 Dehydration: Secondary | ICD-10-CM

## 2024-06-27 DIAGNOSIS — R55 Syncope and collapse: Principal | ICD-10-CM

## 2024-06-27 DIAGNOSIS — R197 Diarrhea, unspecified: Secondary | ICD-10-CM

## 2024-06-27 DIAGNOSIS — R1110 Vomiting, unspecified: Secondary | ICD-10-CM

## 2024-06-30 DIAGNOSIS — Z170 Estrogen receptor positive status [ER+]: Secondary | ICD-10-CM

## 2024-06-30 DIAGNOSIS — C50911 Malignant neoplasm of unspecified site of right female breast: Principal | ICD-10-CM

## 2024-06-30 DIAGNOSIS — J029 Acute pharyngitis, unspecified: Principal | ICD-10-CM

## 2024-06-30 MED ORDER — LIDOCAINE 2% - MAALOX ORAL SUSP
Freq: Three times a day (TID) | ORAL | 0 refills | 10.00000 days | Status: CP | PRN
Start: 2024-06-30 — End: ?

## 2024-07-01 DIAGNOSIS — C50911 Malignant neoplasm of unspecified site of right female breast: Principal | ICD-10-CM

## 2024-07-01 DIAGNOSIS — Z170 Estrogen receptor positive status [ER+]: Secondary | ICD-10-CM

## 2024-07-01 MED ORDER — ZEPBOUND 10 MG/0.5 ML SUBCUTANEOUS SOLUTION
SUBCUTANEOUS | 0 refills | 0.00000 days | Status: CP
Start: 2024-07-01 — End: ?

## 2024-07-01 MED ORDER — AMOXICILLIN 875 MG TABLET
ORAL_TABLET | Freq: Two times a day (BID) | ORAL | 0 refills | 10.00000 days | Status: CP
Start: 2024-07-01 — End: 2024-07-11

## 2024-07-06 DIAGNOSIS — C50911 Malignant neoplasm of unspecified site of right female breast: Principal | ICD-10-CM

## 2024-07-06 DIAGNOSIS — Z170 Estrogen receptor positive status [ER+]: Secondary | ICD-10-CM
